# Patient Record
Sex: Male | Born: 1945 | Race: White | Hispanic: No | Smoking: Never smoker
Health system: Southern US, Community
[De-identification: ages and names within clinical notes are randomized; demographics above are authoritative.]

---

## 2019-06-18 ENCOUNTER — Emergency Department (HOSPITAL_COMMUNITY): Payer: Medicare Other

## 2019-06-18 ENCOUNTER — Inpatient Hospital Stay (HOSPITAL_COMMUNITY)
Admission: EM | Admit: 2019-06-18 | Discharge: 2019-07-07 | DRG: 064 | Disposition: A | Discharge status: Rehabilitation Facility | Payer: Medicare Other | Attending: Internal Medicine | Admitting: Internal Medicine

## 2019-06-18 ENCOUNTER — Other Ambulatory Visit: Payer: Self-pay

## 2019-06-18 ENCOUNTER — Encounter (HOSPITAL_COMMUNITY): Payer: Self-pay | Admitting: Emergency Medicine

## 2019-06-18 DIAGNOSIS — I63439 Cerebral infarction due to embolism of unspecified posterior cerebral artery: Secondary | ICD-10-CM

## 2019-06-18 DIAGNOSIS — Z6829 Body mass index (BMI) 29.0-29.9, adult: Secondary | ICD-10-CM | POA: Diagnosis not present

## 2019-06-18 DIAGNOSIS — R1310 Dysphagia, unspecified: Secondary | ICD-10-CM | POA: Diagnosis not present

## 2019-06-18 DIAGNOSIS — I63532 Cerebral infarction due to unspecified occlusion or stenosis of left posterior cerebral artery: Secondary | ICD-10-CM | POA: Diagnosis not present

## 2019-06-18 DIAGNOSIS — I6389 Other cerebral infarction: Secondary | ICD-10-CM | POA: Diagnosis not present

## 2019-06-18 DIAGNOSIS — I5021 Acute systolic (congestive) heart failure: Secondary | ICD-10-CM | POA: Diagnosis not present

## 2019-06-18 DIAGNOSIS — I130 Hypertensive heart and chronic kidney disease with heart failure and stage 1 through stage 4 chronic kidney disease, or unspecified chronic kidney disease: Secondary | ICD-10-CM | POA: Diagnosis present

## 2019-06-18 DIAGNOSIS — R0602 Shortness of breath: Secondary | ICD-10-CM

## 2019-06-18 DIAGNOSIS — I959 Hypotension, unspecified: Secondary | ICD-10-CM | POA: Diagnosis not present

## 2019-06-18 DIAGNOSIS — R1312 Dysphagia, oropharyngeal phase: Secondary | ICD-10-CM | POA: Diagnosis not present

## 2019-06-18 DIAGNOSIS — I519 Heart disease, unspecified: Secondary | ICD-10-CM

## 2019-06-18 DIAGNOSIS — J189 Pneumonia, unspecified organism: Secondary | ICD-10-CM

## 2019-06-18 DIAGNOSIS — S80212A Abrasion, left knee, initial encounter: Secondary | ICD-10-CM | POA: Diagnosis present

## 2019-06-18 DIAGNOSIS — D490 Neoplasm of unspecified behavior of digestive system: Secondary | ICD-10-CM | POA: Diagnosis present

## 2019-06-18 DIAGNOSIS — G8191 Hemiplegia, unspecified affecting right dominant side: Secondary | ICD-10-CM | POA: Diagnosis present

## 2019-06-18 DIAGNOSIS — I2102 ST elevation (STEMI) myocardial infarction involving left anterior descending coronary artery: Secondary | ICD-10-CM | POA: Diagnosis not present

## 2019-06-18 DIAGNOSIS — E669 Obesity, unspecified: Secondary | ICD-10-CM | POA: Diagnosis present

## 2019-06-18 DIAGNOSIS — Z8249 Family history of ischemic heart disease and other diseases of the circulatory system: Secondary | ICD-10-CM | POA: Diagnosis not present

## 2019-06-18 DIAGNOSIS — X58XXXA Exposure to other specified factors, initial encounter: Secondary | ICD-10-CM | POA: Diagnosis present

## 2019-06-18 DIAGNOSIS — N401 Enlarged prostate with lower urinary tract symptoms: Secondary | ICD-10-CM | POA: Diagnosis present

## 2019-06-18 DIAGNOSIS — I213 ST elevation (STEMI) myocardial infarction of unspecified site: Secondary | ICD-10-CM

## 2019-06-18 DIAGNOSIS — I69322 Dysarthria following cerebral infarction: Secondary | ICD-10-CM | POA: Diagnosis not present

## 2019-06-18 DIAGNOSIS — I639 Cerebral infarction, unspecified: Secondary | ICD-10-CM | POA: Diagnosis not present

## 2019-06-18 DIAGNOSIS — I255 Ischemic cardiomyopathy: Secondary | ICD-10-CM | POA: Diagnosis present

## 2019-06-18 DIAGNOSIS — I63432 Cerebral infarction due to embolism of left posterior cerebral artery: Secondary | ICD-10-CM

## 2019-06-18 DIAGNOSIS — J9601 Acute respiratory failure with hypoxia: Secondary | ICD-10-CM | POA: Diagnosis present

## 2019-06-18 DIAGNOSIS — Z931 Gastrostomy status: Secondary | ICD-10-CM | POA: Diagnosis not present

## 2019-06-18 DIAGNOSIS — R531 Weakness: Secondary | ICD-10-CM

## 2019-06-18 DIAGNOSIS — I69391 Dysphagia following cerebral infarction: Secondary | ICD-10-CM

## 2019-06-18 DIAGNOSIS — G9340 Encephalopathy, unspecified: Secondary | ICD-10-CM | POA: Diagnosis not present

## 2019-06-18 DIAGNOSIS — R9431 Abnormal electrocardiogram [ECG] [EKG]: Secondary | ICD-10-CM | POA: Diagnosis not present

## 2019-06-18 DIAGNOSIS — Z6831 Body mass index (BMI) 31.0-31.9, adult: Secondary | ICD-10-CM

## 2019-06-18 DIAGNOSIS — Z515 Encounter for palliative care: Secondary | ICD-10-CM | POA: Diagnosis present

## 2019-06-18 DIAGNOSIS — Z20822 Contact with and (suspected) exposure to covid-19: Secondary | ICD-10-CM | POA: Diagnosis present

## 2019-06-18 DIAGNOSIS — I340 Nonrheumatic mitral (valve) insufficiency: Secondary | ICD-10-CM | POA: Diagnosis not present

## 2019-06-18 DIAGNOSIS — E869 Volume depletion, unspecified: Secondary | ICD-10-CM | POA: Diagnosis not present

## 2019-06-18 DIAGNOSIS — I2119 ST elevation (STEMI) myocardial infarction involving other coronary artery of inferior wall: Secondary | ICD-10-CM | POA: Diagnosis present

## 2019-06-18 DIAGNOSIS — S80211A Abrasion, right knee, initial encounter: Secondary | ICD-10-CM | POA: Diagnosis present

## 2019-06-18 DIAGNOSIS — R4701 Aphasia: Secondary | ICD-10-CM | POA: Diagnosis present

## 2019-06-18 DIAGNOSIS — B370 Candidal stomatitis: Secondary | ICD-10-CM | POA: Diagnosis present

## 2019-06-18 DIAGNOSIS — I5043 Acute on chronic combined systolic (congestive) and diastolic (congestive) heart failure: Secondary | ICD-10-CM | POA: Diagnosis present

## 2019-06-18 DIAGNOSIS — I2111 ST elevation (STEMI) myocardial infarction involving right coronary artery: Secondary | ICD-10-CM | POA: Diagnosis not present

## 2019-06-18 DIAGNOSIS — J690 Pneumonitis due to inhalation of food and vomit: Secondary | ICD-10-CM | POA: Diagnosis present

## 2019-06-18 DIAGNOSIS — G9341 Metabolic encephalopathy: Secondary | ICD-10-CM | POA: Diagnosis present

## 2019-06-18 DIAGNOSIS — R402232 Coma scale, best verbal response, inappropriate words, at arrival to emergency department: Secondary | ICD-10-CM | POA: Diagnosis present

## 2019-06-18 DIAGNOSIS — I509 Heart failure, unspecified: Secondary | ICD-10-CM | POA: Diagnosis present

## 2019-06-18 DIAGNOSIS — Z79899 Other long term (current) drug therapy: Secondary | ICD-10-CM | POA: Diagnosis not present

## 2019-06-18 DIAGNOSIS — I2510 Atherosclerotic heart disease of native coronary artery without angina pectoris: Secondary | ICD-10-CM | POA: Diagnosis not present

## 2019-06-18 DIAGNOSIS — Z7189 Other specified counseling: Secondary | ICD-10-CM

## 2019-06-18 DIAGNOSIS — R29810 Facial weakness: Secondary | ICD-10-CM | POA: Diagnosis present

## 2019-06-18 DIAGNOSIS — R471 Dysarthria and anarthria: Secondary | ICD-10-CM | POA: Diagnosis present

## 2019-06-18 DIAGNOSIS — R197 Diarrhea, unspecified: Secondary | ICD-10-CM | POA: Diagnosis not present

## 2019-06-18 DIAGNOSIS — R402142 Coma scale, eyes open, spontaneous, at arrival to emergency department: Secondary | ICD-10-CM | POA: Diagnosis present

## 2019-06-18 DIAGNOSIS — I69351 Hemiplegia and hemiparesis following cerebral infarction affecting right dominant side: Secondary | ICD-10-CM | POA: Diagnosis present

## 2019-06-18 DIAGNOSIS — E785 Hyperlipidemia, unspecified: Secondary | ICD-10-CM | POA: Diagnosis present

## 2019-06-18 DIAGNOSIS — K118 Other diseases of salivary glands: Secondary | ICD-10-CM | POA: Diagnosis not present

## 2019-06-18 DIAGNOSIS — E7800 Pure hypercholesterolemia, unspecified: Secondary | ICD-10-CM | POA: Diagnosis not present

## 2019-06-18 DIAGNOSIS — E870 Hyperosmolality and hypernatremia: Secondary | ICD-10-CM | POA: Diagnosis present

## 2019-06-18 DIAGNOSIS — R338 Other retention of urine: Secondary | ICD-10-CM | POA: Diagnosis present

## 2019-06-18 DIAGNOSIS — G4700 Insomnia, unspecified: Secondary | ICD-10-CM | POA: Diagnosis not present

## 2019-06-18 DIAGNOSIS — I6521 Occlusion and stenosis of right carotid artery: Secondary | ICD-10-CM | POA: Diagnosis present

## 2019-06-18 DIAGNOSIS — E872 Acidosis: Secondary | ICD-10-CM | POA: Diagnosis present

## 2019-06-18 DIAGNOSIS — E46 Unspecified protein-calorie malnutrition: Secondary | ICD-10-CM | POA: Diagnosis present

## 2019-06-18 DIAGNOSIS — R0603 Acute respiratory distress: Secondary | ICD-10-CM | POA: Diagnosis not present

## 2019-06-18 DIAGNOSIS — I5023 Acute on chronic systolic (congestive) heart failure: Secondary | ICD-10-CM | POA: Diagnosis present

## 2019-06-18 DIAGNOSIS — R32 Unspecified urinary incontinence: Secondary | ICD-10-CM | POA: Diagnosis present

## 2019-06-18 DIAGNOSIS — Z781 Physical restraint status: Secondary | ICD-10-CM

## 2019-06-18 DIAGNOSIS — R4689 Other symptoms and signs involving appearance and behavior: Secondary | ICD-10-CM

## 2019-06-18 DIAGNOSIS — R29719 NIHSS score 19: Secondary | ICD-10-CM | POA: Diagnosis present

## 2019-06-18 DIAGNOSIS — R339 Retention of urine, unspecified: Secondary | ICD-10-CM | POA: Diagnosis not present

## 2019-06-18 DIAGNOSIS — R918 Other nonspecific abnormal finding of lung field: Secondary | ICD-10-CM | POA: Diagnosis present

## 2019-06-18 DIAGNOSIS — B372 Candidiasis of skin and nail: Secondary | ICD-10-CM | POA: Diagnosis not present

## 2019-06-18 DIAGNOSIS — R402342 Coma scale, best motor response, flexion withdrawal, at arrival to emergency department: Secondary | ICD-10-CM | POA: Diagnosis present

## 2019-06-18 DIAGNOSIS — I69390 Apraxia following cerebral infarction: Secondary | ICD-10-CM | POA: Diagnosis not present

## 2019-06-18 DIAGNOSIS — N183 Chronic kidney disease, stage 3 unspecified: Secondary | ICD-10-CM | POA: Diagnosis present

## 2019-06-18 DIAGNOSIS — N179 Acute kidney failure, unspecified: Secondary | ICD-10-CM | POA: Diagnosis present

## 2019-06-18 HISTORY — DX: Cerebral infarction due to embolism of unspecified posterior cerebral artery: I63.439

## 2019-06-18 LAB — CBG MONITORING, ED
Glucose-Capillary: 86 mg/dL (ref 70–99)
Glucose-Capillary: 91 mg/dL (ref 70–99)

## 2019-06-18 LAB — CBC
HCT: 42.7 % (ref 39.0–52.0)
Hemoglobin: 13.9 g/dL (ref 13.0–17.0)
MCH: 31.7 pg (ref 26.0–34.0)
MCHC: 32.6 g/dL (ref 30.0–36.0)
MCV: 97.3 fL (ref 80.0–100.0)
Platelets: 243 10*3/uL (ref 150–400)
RBC: 4.39 MIL/uL (ref 4.22–5.81)
RDW: 12.9 % (ref 11.5–15.5)
WBC: 17.3 10*3/uL — ABNORMAL HIGH (ref 4.0–10.5)
nRBC: 0 % (ref 0.0–0.2)

## 2019-06-18 LAB — POCT I-STAT EG7
Acid-base deficit: 2 mmol/L (ref 0.0–2.0)
Bicarbonate: 24.1 mmol/L (ref 20.0–28.0)
Calcium, Ion: 1.03 mmol/L — ABNORMAL LOW (ref 1.15–1.40)
HCT: 46 % (ref 39.0–52.0)
Hemoglobin: 15.6 g/dL (ref 13.0–17.0)
O2 Saturation: 69 %
Potassium: 4.1 mmol/L (ref 3.5–5.1)
Sodium: 137 mmol/L (ref 135–145)
TCO2: 25 mmol/L (ref 22–32)
pCO2, Ven: 43 mmHg — ABNORMAL LOW (ref 44.0–60.0)
pH, Ven: 7.357 (ref 7.250–7.430)
pO2, Ven: 37 mmHg (ref 32.0–45.0)

## 2019-06-18 LAB — PROTIME-INR
INR: 1.1 (ref 0.8–1.2)
Prothrombin Time: 13.9 seconds (ref 11.4–15.2)

## 2019-06-18 LAB — DIFFERENTIAL
Abs Immature Granulocytes: 0.13 10*3/uL — ABNORMAL HIGH (ref 0.00–0.07)
Basophils Absolute: 0 10*3/uL (ref 0.0–0.1)
Basophils Relative: 0 %
Eosinophils Absolute: 0.1 10*3/uL (ref 0.0–0.5)
Eosinophils Relative: 0 %
Immature Granulocytes: 1 %
Lymphocytes Relative: 8 %
Lymphs Abs: 1.4 10*3/uL (ref 0.7–4.0)
Monocytes Absolute: 2 10*3/uL — ABNORMAL HIGH (ref 0.1–1.0)
Monocytes Relative: 11 %
Neutro Abs: 13.8 10*3/uL — ABNORMAL HIGH (ref 1.7–7.7)
Neutrophils Relative %: 80 %

## 2019-06-18 LAB — I-STAT CHEM 8, ED
BUN: 41 mg/dL — ABNORMAL HIGH (ref 8–23)
Calcium, Ion: 1.01 mmol/L — ABNORMAL LOW (ref 1.15–1.40)
Chloride: 104 mmol/L (ref 98–111)
Creatinine, Ser: 1.5 mg/dL — ABNORMAL HIGH (ref 0.61–1.24)
Glucose, Bld: 95 mg/dL (ref 70–99)
HCT: 43 % (ref 39.0–52.0)
Hemoglobin: 14.6 g/dL (ref 13.0–17.0)
Potassium: 3.6 mmol/L (ref 3.5–5.1)
Sodium: 137 mmol/L (ref 135–145)
TCO2: 23 mmol/L (ref 22–32)

## 2019-06-18 LAB — COMPREHENSIVE METABOLIC PANEL
ALT: 60 U/L — ABNORMAL HIGH (ref 0–44)
AST: 73 U/L — ABNORMAL HIGH (ref 15–41)
Albumin: 2.9 g/dL — ABNORMAL LOW (ref 3.5–5.0)
Alkaline Phosphatase: 69 U/L (ref 38–126)
Anion gap: 13 (ref 5–15)
BUN: 43 mg/dL — ABNORMAL HIGH (ref 8–23)
CO2: 20 mmol/L — ABNORMAL LOW (ref 22–32)
Calcium: 8.5 mg/dL — ABNORMAL LOW (ref 8.9–10.3)
Chloride: 105 mmol/L (ref 98–111)
Creatinine, Ser: 1.66 mg/dL — ABNORMAL HIGH (ref 0.61–1.24)
GFR calc Af Amer: 47 mL/min — ABNORMAL LOW (ref >60)
GFR calc non Af Amer: 40 mL/min — ABNORMAL LOW (ref >60)
Glucose, Bld: 102 mg/dL — ABNORMAL HIGH (ref 70–99)
Potassium: 3.7 mmol/L (ref 3.5–5.1)
Sodium: 138 mmol/L (ref 135–145)
Total Bilirubin: 0.9 mg/dL (ref 0.3–1.2)
Total Protein: 6.7 g/dL (ref 6.5–8.1)

## 2019-06-18 LAB — TROPONIN I (HIGH SENSITIVITY)
Troponin I (High Sensitivity): 12799 ng/L (ref <18)
Troponin I (High Sensitivity): 12437 ng/L (ref <18)
Troponin I (High Sensitivity): 10883 ng/L (ref <18)

## 2019-06-18 LAB — APTT: aPTT: 26 seconds (ref 24–36)

## 2019-06-18 LAB — RESPIRATORY PANEL BY RT PCR (FLU A&B, COVID)
Influenza A by PCR: NEGATIVE
Influenza B by PCR: NEGATIVE
SARS Coronavirus 2 by RT PCR: NEGATIVE

## 2019-06-18 MED ORDER — ACETAMINOPHEN 650 MG RE SUPP: 650.0000 mg | RECTAL | Status: DC | PRN

## 2019-06-18 MED ORDER — SODIUM CHLORIDE 0.9% FLUSH
3.0000 mL | Freq: Once | INTRAVENOUS | Status: AC
  Administered 2019-06-18: 3 mL via INTRAVENOUS

## 2019-06-18 MED ORDER — SODIUM CHLORIDE 0.9 % IV SOLN: INTRAVENOUS | Status: DC

## 2019-06-18 MED ORDER — ATORVASTATIN CALCIUM 80 MG PO TABS
80.0000 mg | ORAL_TABLET | Freq: Every day | ORAL | Status: DC
  Administered 2019-06-21 – 2019-07-02 (×12): 80 mg via ORAL
  Filled 2019-06-18 (×11): qty 1

## 2019-06-18 MED ORDER — ASPIRIN 81 MG PO CHEW: 81.0000 mg | CHEWABLE_TABLET | Freq: Every day | ORAL | Status: DC

## 2019-06-18 MED ORDER — STROKE: EARLY STAGES OF RECOVERY BOOK: Freq: Once | Status: DC

## 2019-06-18 MED ORDER — IOHEXOL 350 MG/ML SOLN
100.0000 mL | Freq: Once | INTRAVENOUS | Status: AC | PRN
  Administered 2019-06-18: 100 mL via INTRAVENOUS

## 2019-06-18 MED ORDER — ACETAMINOPHEN 325 MG PO TABS: 650.0000 mg | ORAL_TABLET | ORAL | Status: DC | PRN

## 2019-06-18 MED ORDER — HEPARIN (PORCINE) 25000 UT/250ML-% IV SOLN
1600.0000 [IU]/h | INTRAVENOUS | Status: DC
  Administered 2019-06-18: 1200 [IU]/h via INTRAVENOUS
  Administered 2019-06-19: 1400 [IU]/h via INTRAVENOUS
  Administered 2019-06-20: 1600 [IU]/h via INTRAVENOUS
  Filled 2019-06-18 (×3): qty 250

## 2019-06-18 MED ORDER — ACETAMINOPHEN 160 MG/5ML PO SOLN: 650.0000 mg | ORAL | Status: DC | PRN

## 2019-06-18 NOTE — ED Triage Notes (Signed)
Pt was reported to have last spoke with his daughter on the phone & was normal around 1700 yesterday & when his neighbor noticed that he had not gotten his newspaper this morning he called for a wellfair check & EMS reports forced entry at his home & finding him laying supine in his bed with aphasia & Rt sided facial droop.

## 2019-06-18 NOTE — H&P (Signed)
History and Physical    Levi Pruitt [id number] DOB: [birth year] DOA: 06/18/2019  PCP: Patient, No Pcp Per  Patient coming from: home   Chief Complaint:  Found down at home  HPI: Levi Pruitt is a 74 y.o. male with medical history significant for no medical history (has not seen a physician in decades) who presents with above.  History obtained from significant other who is also his power of attorney. Patient is not communicative.  Last normal was last night around 9 pm when sig other spoke with patient on the phone. She notes that over the past several months the patient has been more sedentary than previously and having more problems with memory, but there have been no acute changes and no recent illness.  This morning someone from patient's work came by to drop off paperwork. Patient didn't answer door or answer phone. Co-worker came inside and found patient in bed, not making sense. EMS called and brought to our ED.  ED Course: code stroke, out of window for tpa. EKG showed likely stemi; initial troponin 12,799. Neuroimaging performed showing L pca and vertrebral infarct. Shared decision w/ cardiology to start heparin w/o bolus.  Review of Systems: As per HPI otherwise 10 point review of systems negative.    Past Medical History:  Diagnosis Date  . 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 No pertinent surgical history.   has no history on file for tobacco, alcohol, and drug.  No Known Allergies  History reviewed. No pertinent family history.  Prior to Admission medications   Not on File    Physical Exam: Vitals:   06/18/19 1815 06/18/19 1830 06/18/19 1845 06/18/19 1900  BP: (!) 141/80 (!) 129/94 128/86 134/83  Pulse: 81  77 80  Resp: (!) 23 (!) 21 (!) 24 (!) 25  SpO2: 95%  95% 96%    Constitutional: dissheveled Head: Atraumatic Eyes: Conjunctiva clear ENM: dry mucous membranes. Normal dentition.  Neck:  Supple Respiratory: rales at bases Cardiovascular: Regular rate and rhythm. Distant heart sounds, soft systolic murmur Abdomen: Non-tender, obese. No masses. No rebound or guarding. Positive bowel sounds. Musculoskeletal: No joint deformity upper and lower extremities. Normal ROM, no contractures. Decreased muscle tone.  Skin: bruises b/l knees Extremities: trace LE peripheral edema. Palpable peripheral pulses. Neurologic: awake, mumbles. Pupils not equal, gaze disconjugate. Right facial droop. Not using right arm or leg, poor at following commands Psychiatric: aao x0   Labs on Admission: I have personally reviewed following labs and imaging studies  CBC: Recent Labs  Lab 06/18/19 1439 06/18/19 1447 06/18/19 1619  WBC 17.3*  --   --   NEUTROABS 13.8*  --   --   HGB 13.9 14.6 15.6  HCT 42.7 43.0 46.0  MCV 97.3  --   --   PLT 243  --   --    Basic Metabolic Panel: Recent Labs  Lab 06/18/19 1439 06/18/19 1447 06/18/19 1619  NA 138 137 137  K 3.7 3.6 4.1  CL 105 104  --   CO2 20*  --   --   GLUCOSE 102* 95  --   BUN 43* 41*  --   CREATININE 1.66* 1.50*  --   CALCIUM 8.5*  --   --    GFR: CrCl cannot be calculated (Unknown ideal weight.). Liver Function Tests: Recent Labs  Lab 06/18/19 1439  AST 73*  ALT 60*  ALKPHOS 69  BILITOT 0.9  PROT 6.7  ALBUMIN 2.9*   No results for input(s): LIPASE, AMYLASE in the last 168 hours. No results for input(s): AMMONIA in the last 168 hours. Coagulation Profile: Recent Labs  Lab 06/18/19 1439  INR 1.1   Cardiac Enzymes: No results for input(s): CKTOTAL, CKMB, CKMBINDEX, TROPONINI in the last 168 hours. BNP (last 3 results) No results for input(s): PROBNP in the last 8760 hours. HbA1C: No results for input(s): HGBA1C in the last 72 hours. CBG: Recent Labs  Lab 06/18/19 1442 06/18/19 1451  GLUCAP 91 86   Lipid Profile: No results for input(s): CHOL, HDL, LDLCALC, TRIG, CHOLHDL, LDLDIRECT in the last 72  hours. Thyroid Function Tests: No results for input(s): TSH, T4TOTAL, FREET4, T3FREE, THYROIDAB in the last 72 hours. Anemia Panel: No results for input(s): VITAMINB12, FOLATE, FERRITIN, TIBC, IRON, RETICCTPCT in the last 72 hours. Urine analysis: No results found for: COLORURINE, APPEARANCEUR, LABSPEC, PHURINE, GLUCOSEU, HGBUR, BILIRUBINUR, KETONESUR, PROTEINUR, UROBILINOGEN, NITRITE, LEUKOCYTESUR  Radiological Exams on Admission: CT Code Stroke CTA Head W/WO contrast  Result Date: 06/18/2019 CLINICAL DATA:  74 year old male code stroke presentation with right side weakness. EXAM: CT ANGIOGRAPHY HEAD AND NECK CT PERFUSION BRAIN TECHNIQUE: Multidetector CT imaging of the head and neck was performed using the standard protocol during bolus administration of intravenous contrast. Multiplanar CT image reconstructions and MIPs were obtained to evaluate the vascular anatomy. Carotid stenosis measurements (when applicable) are obtained utilizing NASCET criteria, using the distal internal carotid diameter as the denominator. Multiphase CT imaging of the brain was performed following IV bolus contrast injection. Subsequent parametric perfusion maps were calculated using RAPID software. CONTRAST:  100 milliliters Omnipaque 350 COMPARISON:  Plain head CT 1448 hours today. FINDINGS: CT Brain Perfusion Findings: ASPECTS: 10, although a large left PCA territory infarct was suspected by plain CT. CBF (<30%) Volume: 45mL, corresponding to a portion of the left PCA territory in the occipital lobe Perfusion (Tmax>6.0s) volume: 55mL, although some of this is likely artifactual involving the right temporal lobe portion of which is chronically abnormal. Mismatch Volume: 66mL Infarction Location:Left PCA CTA NECK Skeleton: Carious dentition. No acute osseous abnormality identified. Upper chest: Small bilateral layering pleural effusions. Septal thickening and mild additional peribronchial opacity near the hila in the upper  lobes. No superior mediastinal lymphadenopathy. Other neck: Retropharyngeal course of both carotid arteries. Small round 8 millimeter intermediate density soft tissue nodule in the superficial lobe of the left parotid gland. No cervical lymphadenopathy or other neck mass. Aortic arch: Mild Calcified aortic atherosclerosis. 3 vessel arch configuration. Right carotid system: Mild motion artifact at the right CCA origin. Circumferential soft plaque in the right CCA proximal to the bifurcation as it has a retropharyngeal course on series 3, image 125, but less than 50 % stenosis with respect to the distal vessel. Superimposed calcified plaque at the right carotid bifurcation, and bulky additional soft plaque in the right ICA bulb resulting in a short segment high-grade stenosis approaching a radiographic string sign on series 3, image 94. The right ICA remains patent to the skull base. Left carotid system: Normal left CCA origin. Circumferential soft plaque in the left CCA similar to that on the right without stenosis. Soft and calcified plaque at the left ICA origin and bulb resulting in up to 60 % stenosis with respect to the distal vessel. Left ICA remains patent to the skull base. Vertebral arteries: Mild if any proximal right subclavian artery plaque with no stenosis there or at the right vertebral artery origin. The right vertebral appears  dominant and is patent to the skull base without stenosis. Soft and calcified plaque in the proximal left subclavian artery not resulting in significant subclavian artery stenosis, but there is moderate to severe stenosis at the left vertebral artery origin and V1 segment due to mostly soft plaque (series 6, image 154). The distal left V1 segment is also diminutive and stenotic (image 151). The left V2 segment is intermittently diminutive and stenotic, but the vessel remains patent to the skull base. CTA HEAD Posterior circulation: The right vertebral artery is patent to the  vertebrobasilar junction without stenosis. The right PICA origin remains patent. The left V4 segment is occluded distal to the patent left PICA but proximal to the vertebrobasilar junction on series 5, image 132. The basilar artery remains patent to the SCA origins. The left PCA is occluded just beyond its origin (series 9, image 26). No reconstitution. And the right PCA is functionally occluded at its origin with only minimal irregular right PCA branch enhancement. Anterior circulation: Both ICA siphons are patent. On the left there is moderate to severe circumferential calcified plaque in the cavernous segment resulting in moderate to severe stenosis proximal to the anterior genu (series 5, image 102). Moderate additional left supraclinoid segment stenosis. On the right there is similar advanced siphon atherosclerosis with moderate right cavernous and moderate to severe right supraclinoid segment stenosis. Patent right ICA terminus. There is a patent small left posterior communicating artery. Patent carotid termini. Patent MCA and ACA origins. Left A1 is mildly dominant. Anterior communicating artery is patent with a median artery of the corpus callosum. Bilateral ACA branches are patent with mild irregularity. Left MCA M1 segment and left MCA bifurcation are patent without stenosis. Left MCA M3 branches are mildly to moderately irregular. Right MCA M1 segment and bifurcation are patent without stenosis. Right MCA M3 branches are also mildly to moderately irregular. Venous sinuses: Early contrast timing, grossly patent assuming dominant right side transverse and sigmoid sinuses. Anatomic variants: Median artery of the corpus callosum. Review of the MIP images confirms the above findings IMPRESSION: 1. Positive for acute appearing occlusions of the distal Left Vertebral Artery and Left PCA: - distal left V4 occlusion, with upstream multifocal moderate and severe left vertebral stenosis in the neck. - left PCA P1  occlusion just beyond the origin. 2. CTP detects only some of the left PCA territory core infarct which was visible by plain CT. 3. 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 The right vertebral artery and basilar arteries are patent without stenosis. 4. 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 5. Mild to moderate irregularity of the bilateral MCA branches. 6. Small bilateral pleural effusions with increased pulmonary septal thickening suggestive of pulmonary edema. 7. 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 Recommend follow-up with ENT. Preliminary results of the arterial findings were text paged to Dr. Lorraine Lax via Shea Evans at 1503 hours, and again at 1518 hours. Electronically Signed   By: Genevie Ann M.D.   On: 06/18/2019 15:26   CT Code Stroke CTA Neck W/WO contrast  Result Date: 06/18/2019 CLINICAL DATA:  74 year old male code stroke presentation with right side weakness. EXAM: CT ANGIOGRAPHY HEAD AND NECK CT PERFUSION BRAIN TECHNIQUE: Multidetector CT imaging of the head and neck was performed using the standard protocol during bolus administration of intravenous contrast. Multiplanar CT image  reconstructions and MIPs were obtained to evaluate the vascular anatomy. Carotid stenosis measurements (when applicable) are obtained utilizing NASCET criteria, using the distal internal carotid diameter as the denominator. Multiphase CT imaging of the brain was performed following IV bolus contrast injection. Subsequent parametric perfusion maps were calculated using RAPID software. CONTRAST:  100 milliliters Omnipaque 350 COMPARISON:  Plain head CT 1448 hours today. FINDINGS: CT Brain Perfusion Findings: ASPECTS: 10, although a large left PCA territory infarct was suspected by plain CT. CBF (<30%) Volume: 68mL, corresponding to a portion  of the left PCA territory in the occipital lobe Perfusion (Tmax>6.0s) volume: 73mL, although some of this is likely artifactual involving the right temporal lobe portion of which is chronically abnormal. Mismatch Volume: 8mL Infarction Location:Left PCA CTA NECK Skeleton: Carious dentition. No acute osseous abnormality identified. Upper chest: Small bilateral layering pleural effusions. Septal thickening and mild additional peribronchial opacity near the hila in the upper lobes. No superior mediastinal lymphadenopathy. Other neck: Retropharyngeal course of both carotid arteries. Small round 8 millimeter intermediate density soft tissue nodule in the superficial lobe of the left parotid gland. No cervical lymphadenopathy or other neck mass. Aortic arch: Mild Calcified aortic atherosclerosis. 3 vessel arch configuration. Right carotid system: Mild motion artifact at the right CCA origin. Circumferential soft plaque in the right CCA proximal to the bifurcation as it has a retropharyngeal course on series 3, image 125, but less than 50 % stenosis with respect to the distal vessel. Superimposed calcified plaque at the right carotid bifurcation, and bulky additional soft plaque in the right ICA bulb resulting in a short segment high-grade stenosis approaching a radiographic string sign on series 3, image 94. The right ICA remains patent to the skull base. Left carotid system: Normal left CCA origin. Circumferential soft plaque in the left CCA similar to that on the right without stenosis. Soft and calcified plaque at the left ICA origin and bulb resulting in up to 60 % stenosis with respect to the distal vessel. Left ICA remains patent to the skull base. Vertebral arteries: Mild if any proximal right subclavian artery plaque with no stenosis there or at the right vertebral artery origin. The right vertebral appears dominant and is patent to the skull base without stenosis. Soft and calcified plaque in the proximal left  subclavian artery not resulting in significant subclavian artery stenosis, but there is moderate to severe stenosis at the left vertebral artery origin and V1 segment due to mostly soft plaque (series 6, image 154). The distal left V1 segment is also diminutive and stenotic (image 151). The left V2 segment is intermittently diminutive and stenotic, but the vessel remains patent to the skull base. CTA HEAD Posterior circulation: The right vertebral artery is patent to the vertebrobasilar junction without stenosis. The right PICA origin remains patent. The left V4 segment is occluded distal to the patent left PICA but proximal to the vertebrobasilar junction on series 5, image 132. The basilar artery remains patent to the SCA origins. The left PCA is occluded just beyond its origin (series 9, image 26). No reconstitution. And the right PCA is functionally occluded at its origin with only minimal irregular right PCA branch enhancement. Anterior circulation: Both ICA siphons are patent. On the left there is moderate to severe circumferential calcified plaque in the cavernous segment resulting in moderate to severe stenosis proximal to the anterior genu (series 5, image 102). Moderate additional left supraclinoid segment stenosis. On the right there is similar advanced siphon atherosclerosis with moderate  right cavernous and moderate to severe right supraclinoid segment stenosis. Patent right ICA terminus. There is a patent small left posterior communicating artery. Patent carotid termini. Patent MCA and ACA origins. Left A1 is mildly dominant. Anterior communicating artery is patent with a median artery of the corpus callosum. Bilateral ACA branches are patent with mild irregularity. Left MCA M1 segment and left MCA bifurcation are patent without stenosis. Left MCA M3 branches are mildly to moderately irregular. Right MCA M1 segment and bifurcation are patent without stenosis. Right MCA M3 branches are also mildly to  moderately irregular. Venous sinuses: Early contrast timing, grossly patent assuming dominant right side transverse and sigmoid sinuses. Anatomic variants: Median artery of the corpus callosum. Review of the MIP images confirms the above findings IMPRESSION: 1. Positive for acute appearing occlusions of the distal Left Vertebral Artery and Left PCA: - distal left V4 occlusion, with upstream multifocal moderate and severe left vertebral stenosis in the neck. - left PCA P1 occlusion just beyond the origin. 2. CTP detects only some of the left PCA territory core infarct which was visible by plain CT. 3. 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 The right vertebral artery and basilar arteries are patent without stenosis. 4. 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 5. Mild to moderate irregularity of the bilateral MCA branches. 6. Small bilateral pleural effusions with increased pulmonary septal thickening suggestive of pulmonary edema. 7. 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 Recommend follow-up with ENT. Preliminary results of the arterial findings were text paged to Dr. Lorraine Lax via Shea Evans at 1503 hours, and again at 1518 hours. Electronically Signed   By: Genevie Ann M.D.   On: 06/18/2019 15:26   CT Code Stroke Cerebral Perfusion with contrast  Result Date: 06/18/2019 CLINICAL DATA:  74 year old male code stroke presentation with right side weakness. EXAM: CT ANGIOGRAPHY HEAD AND NECK CT PERFUSION BRAIN TECHNIQUE: Multidetector CT imaging of the head and neck was performed using the standard protocol during bolus administration of intravenous contrast. Multiplanar CT image reconstructions and MIPs were obtained to evaluate the vascular anatomy. Carotid stenosis measurements (when applicable) are obtained utilizing NASCET criteria,  using the distal internal carotid diameter as the denominator. Multiphase CT imaging of the brain was performed following IV bolus contrast injection. Subsequent parametric perfusion maps were calculated using RAPID software. CONTRAST:  100 milliliters Omnipaque 350 COMPARISON:  Plain head CT 1448 hours today. FINDINGS: CT Brain Perfusion Findings: ASPECTS: 10, although a large left PCA territory infarct was suspected by plain CT. CBF (<30%) Volume: 69mL, corresponding to a portion of the left PCA territory in the occipital lobe Perfusion (Tmax>6.0s) volume: 81mL, although some of this is likely artifactual involving the right temporal lobe portion of which is chronically abnormal. Mismatch Volume: 11mL Infarction Location:Left PCA CTA NECK Skeleton: Carious dentition. No acute osseous abnormality identified. Upper chest: Small bilateral layering pleural effusions. Septal thickening and mild additional peribronchial opacity near the hila in the upper lobes. No superior mediastinal lymphadenopathy. Other neck: Retropharyngeal course of both carotid arteries. Small round 8 millimeter intermediate density soft tissue nodule in the superficial lobe of the left parotid gland. No cervical lymphadenopathy or other neck mass. Aortic arch: Mild Calcified aortic atherosclerosis. 3 vessel arch configuration. Right carotid system: Mild motion artifact at the right CCA origin. Circumferential soft plaque in the  right CCA proximal to the bifurcation as it has a retropharyngeal course on series 3, image 125, but less than 50 % stenosis with respect to the distal vessel. Superimposed calcified plaque at the right carotid bifurcation, and bulky additional soft plaque in the right ICA bulb resulting in a short segment high-grade stenosis approaching a radiographic string sign on series 3, image 94. The right ICA remains patent to the skull base. Left carotid system: Normal left CCA origin. Circumferential soft plaque in the left CCA  similar to that on the right without stenosis. Soft and calcified plaque at the left ICA origin and bulb resulting in up to 60 % stenosis with respect to the distal vessel. Left ICA remains patent to the skull base. Vertebral arteries: Mild if any proximal right subclavian artery plaque with no stenosis there or at the right vertebral artery origin. The right vertebral appears dominant and is patent to the skull base without stenosis. Soft and calcified plaque in the proximal left subclavian artery not resulting in significant subclavian artery stenosis, but there is moderate to severe stenosis at the left vertebral artery origin and V1 segment due to mostly soft plaque (series 6, image 154). The distal left V1 segment is also diminutive and stenotic (image 151). The left V2 segment is intermittently diminutive and stenotic, but the vessel remains patent to the skull base. CTA HEAD Posterior circulation: The right vertebral artery is patent to the vertebrobasilar junction without stenosis. The right PICA origin remains patent. The left V4 segment is occluded distal to the patent left PICA but proximal to the vertebrobasilar junction on series 5, image 132. The basilar artery remains patent to the SCA origins. The left PCA is occluded just beyond its origin (series 9, image 26). No reconstitution. And the right PCA is functionally occluded at its origin with only minimal irregular right PCA branch enhancement. Anterior circulation: Both ICA siphons are patent. On the left there is moderate to severe circumferential calcified plaque in the cavernous segment resulting in moderate to severe stenosis proximal to the anterior genu (series 5, image 102). Moderate additional left supraclinoid segment stenosis. On the right there is similar advanced siphon atherosclerosis with moderate right cavernous and moderate to severe right supraclinoid segment stenosis. Patent right ICA terminus. There is a patent small left posterior  communicating artery. Patent carotid termini. Patent MCA and ACA origins. Left A1 is mildly dominant. Anterior communicating artery is patent with a median artery of the corpus callosum. Bilateral ACA branches are patent with mild irregularity. Left MCA M1 segment and left MCA bifurcation are patent without stenosis. Left MCA M3 branches are mildly to moderately irregular. Right MCA M1 segment and bifurcation are patent without stenosis. Right MCA M3 branches are also mildly to moderately irregular. Venous sinuses: Early contrast timing, grossly patent assuming dominant right side transverse and sigmoid sinuses. Anatomic variants: Median artery of the corpus callosum. Review of the MIP images confirms the above findings IMPRESSION: 1. Positive for acute appearing occlusions of the distal Left Vertebral Artery and Left PCA: - distal left V4 occlusion, with upstream multifocal moderate and severe left vertebral stenosis in the neck. - left PCA P1 occlusion just beyond the origin. 2. CTP detects only some of the left PCA territory core infarct which was visible by plain CT. 3. 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 The right vertebral artery and basilar arteries are patent without stenosis. 4. Positive also for: - High-grade stenosis of the Right ICA bulb approaching a  radiographic string sign, - High-grade stenosis of both ICA siphons due to advanced calcified plaque, -60% proximal left ICA stenosis. 5. Mild to moderate irregularity of the bilateral MCA branches. 6. Small bilateral pleural effusions with increased pulmonary septal thickening suggestive of pulmonary edema. 7. 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 Recommend follow-up with ENT. Preliminary results of the arterial findings were text paged to Dr. Lorraine Lax via Shea Evans at 1503 hours, and again at 1518 hours. Electronically Signed   By: Genevie Ann M.D.   On: 06/18/2019 15:26   XR Chest Single View  Result Date: 06/18/2019 CLINICAL DATA:   Altered behavior EXAM: PORTABLE CHEST 1 VIEW COMPARISON:  None. FINDINGS: Mild cardiomegaly with central vascular congestion. Probable small pleural effusions. Patchy airspace disease left infrahilar lung. No pneumothorax. IMPRESSION: 1. Mild cardiomegaly with central vascular congestion and small pleural effusions. 2. Patchy airspace disease in the left infrahilar lung, atelectasis versus pneumonia. Electronically Signed   By: Donavan Foil M.D.   On: 06/18/2019 17:40   CT HEAD CODE STROKE WO CONTRAST  Result Date: 06/18/2019 CLINICAL DATA:  Code stroke. 74 year old male with right side weakness and facial droop. EXAM: CT HEAD WITHOUT CONTRAST TECHNIQUE: Contiguous axial images were obtained from the base of the skull through the vertex without intravenous contrast. COMPARISON:  None. FINDINGS: Brain: Large area of hypodense cytotoxic edema demonstrated from the mesial left temporal lobe through the left occipital pole in keeping with a large left PCA territory infarct. Mild regional mass effect. No associated hemorrhage. Comparatively mild hypodensity in the left thalamus. Superimposed chronic appearing encephalomalacia in the right temporal and occipital lobes. No MCA territory acute cortically based infarct changes identified. No acute intracranial hemorrhage identified. No ventriculomegaly. No midline shift and patent basilar cisterns. Vascular: Calcified atherosclerosis at the skull base. No suspicious intracranial vascular hyperdensity. Skull: No acute osseous abnormality identified. Sinuses/Orbits: Well pneumatized paranasal sinuses. Tympanic cavities and mastoids are clear. Other: Visualized orbits and scalp soft tissues are within normal limits. ASPECTS [facility] Stroke Program Early CT Score) Total score (0-10 with 10 being normal): 10, but not applicable in this case given evidence of large acute left PCA infarct. IMPRESSION: 1. Large acute Left PCA territory infarct. No associated hemorrhage. Minor  mass effect. 2. No acute anterior circulation infarct is evident (ASPECTS 10). 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 3. These results were communicated to Dr. Lorraine Lax at 2:59 pm on 06/18/2019 by text page via the [facility] messaging system. Electronically Signed   By: Genevie Ann M.D.   On: 06/18/2019 15:02    EKG: Independently reviewed. 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 Neuro has seen. Out of window for tpa. Heparin as below, keeping in mind risk of hemorrhagic conversion. Glucose wnl. Significant other updated of serious condition. covid neg - appreciate neuro recs - stroke order set - permissive htn - npo, swallow eval ordered - holding on pt/ot consult given degree of current debility - progressive status - TTE and MRI ordered - telemetry - neuro checks - asa and statin ordered  # STEMI - initial trop very elevated. Seen by cardiology, heparin w/o  loading dose ordered, not currently candidate for intervention. Hemodynamically stable. - heparin - trend troponins - cont asa and statin; eventual plavix - telemetry  # AKI - suspect aki, no prior cr for comparison. 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

## 2019-06-18 NOTE — ED Provider Notes (Signed)
  Provider Note MRN:  [id number]  Arrival date & time: 06/18/19    ED Course and Medical Decision Making  Assumed care from Dr. Melina Copa and PA Quentin Cornwall at shift change.  Patient presenting as code stroke and CT revealing large acute PCA territory infarct.  He is altered with garbled speech.  I was provided his EKG which is concerning for ST elevation myocardial infarction given the elevations inferiorly with the reciprocal depressions laterally.  I considered that these findings were related to the acute stroke.  We also considered that patient is a poor candidate for anticoagulation given the acute stroke, as recommended by the neurology team.  For these reasons, code STEMI was not called.  Rather, I immediately paged and talked to Dr. Claiborne Billings of cardiology.  He was able to review the EKG and we discussed the patient's complicated case.  We concluded that patient is best managed medically at this point, but not a candidate for PCI at this time.  We also discussed the morphology of the EKG and we agree that the findings seem more localized to the RCA rather than having diffuse changes which would be more concerning for dissection.  We will hold off on CTA imaging of the aorta for now.  3:37 PM update: Discussed the case again with Dr. Lorraine Lax of neurology.  Patient may be able to have heparin drip and may be appropriate for PCI from a neurology perspective.  Dr. Lorraine Lax has agreed to call Dr. Claiborne Billings and they will discuss the patient's acute needs.  6:45 PM update: Patient has been accepted for admission by the hospitalist service, who will act as primary team with the consulting neurology and cardiology services.  Dr. Burt Knack of cardiology has ordered heparin.  There is risk of hemorrhagic conversion of the stroke but the consensus is that there is greater risk of not treating the acute MI.  Will avoid bolus heparin and provide drip.  .Critical Care Performed by: Maudie Flakes, MD Authorized by: Maudie Flakes, MD   Critical care provider statement:    Critical care time (minutes):  33   Critical care was necessary to treat or prevent imminent or life-threatening deterioration of the following conditions:  CNS failure or compromise (Acute ischemic stroke)   Critical care was time spent personally by me on the following activities:  Discussions with consultants, evaluation of patient's response to treatment, examination of patient, ordering and performing treatments and interventions, ordering and review of laboratory studies, ordering and review of radiographic studies, pulse oximetry, re-evaluation of patient's condition, obtaining history from patient or surrogate and review of old charts   I assumed direction of critical care for this patient from another provider in my specialty: yes      Final Clinical Impressions(s) / ED Diagnoses     ICD-10-CM   1. Acute ischemic stroke (HCC)  I63.9   2. Abnormal EKG  R94.31   3. Altered behavior  R46.89 XR Chest Single View    XR Chest Single View  4. ST elevation myocardial infarction (STEMI), unspecified artery [facility])  I21.3     ED Discharge Orders    None      Discharge Instructions   None     Barth Kirks. Sedonia Small, Remy [email or web address]    Maudie Flakes, MD 06/18/19 [phone]

## 2019-06-18 NOTE — Consult Note (Addendum)
Cardiology Consultation:   Patient ID: Levi Pruitt; [id number]; 1946-05-29   Admit date: 06/18/2019 Date of Consult: 06/18/2019  Primary Care Provider: Patient, No Pcp Per Primary Cardiologist: No primary care provider on file. New Primary Electrophysiologist:  None   Patient Profile:   Levi Pruitt is a 75 y.o. male with unknown med hx, who is being seen today for the evaluation of abnl ECG  at the request of Dr Sedonia Small.  History of Present Illness:   [name] has no previous records in Epic or Care Everywhere.   His girlfriend and healthcare POA is Levi Pruitt, I spoke w/ her on the phone. She does not live with him, but talks to him daily.   Per her, he has not been to the doctor in probably 20 years. No recent medical care or UC visits.   Pt having memory problems, not remembering to eat, not remembering talking to family, etc.   No complaints of pain, no specific issues. Started complaining of feeling weak, general malaise a weak ago. No fevers or cough. Forgot to eat for a couple of days. Problems getting up off the couch, weakness getting worse.   Last pm, pt had eaten, he was feeling a little better. No reports of chest pain.  Today, neighbors became concerned>>welfare check>>pt found in bed, R facial droop, R arm flaccid, garbled speech.  Pt currently does not appear uncomfortable. He is not able to answer questions but does vocalize some sounds. Not moving R side. L movements are not completely random, but not responsive to commands.   Past Medical History:  Diagnosis Date  . 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 No pertinent surgical history.   Prior to Admission medications   None    Inpatient Medications: Scheduled Meds:  Continuous Infusions:  PRN Meds:   Allergies:   No Known Allergies  Social History:   Social History   Socioeconomic History  . Marital status: Widowed    Spouse name: Not on  file  . Number of children: Not on file  . Years of education: Not on file  . Highest education level: Not on file  Occupational History  . Occupation: Lawer, semi-retired.  Tobacco Use  . Smoking status: Not on file  Substance and Sexual Activity  . Alcohol use: Not on file  . Drug use: Not on file  . Sexual activity: Not on file  Other Topics Concern  . Not on file  Social History Narrative   Staying home most of the time, does not exercise.    Social Determinants of Health   Financial Resource Strain:   . Difficulty of Paying Living Expenses: Not on file  Food Insecurity:   . Worried About Charity fundraiser in the Last Year: Not on file  . Ran Out of Food in the Last Year: Not on file  Transportation Needs:   . Lack of Transportation (Medical): Not on file  . Lack of Transportation (Non-Medical): Not on file  Physical Activity:   . Days of Exercise per Week: Not on file  . Minutes of Exercise per Session: Not on file  Stress:   . Feeling of Stress : Not on file  Social Connections:   . Frequency of Communication with Friends and Family: Not on file  . Frequency of Social Gatherings with Friends and Family: Not on file  . Attends Religious Services: Not on file  . Active Member of Clubs  or Organizations: Not on file  . Attends Archivist Meetings: Not on file  . Marital Status: Not on file  Intimate Partner Violence:   . Fear of Current or Ex-Partner: Not on file  . Emotionally Abused: Not on file  . Physically Abused: Not on file  . Sexually Abused: Not on file    Family History:   History reviewed. No pertinent family history known Family Status:  Family Status  Relation Name Status  . Mother  Deceased  . Father  Deceased    ROS:  Please see the history of present illness.  All other ROS reviewed and negative.     Physical Exam/Data:   Vitals:   06/18/19 1515 06/18/19 1530 06/18/19 1545 06/18/19 1600  BP: (!) 120/106  109/71 114/75    Pulse: 83 79 70 69  Resp: (!) 25 (!) 25 (!) 24 (!) 24  SpO2: 96% 95% 93% 96%   No intake or output data in the 24 hours ending 06/18/19 1704 There were no vitals filed for this visit. There is no height or weight on file to calculate BMI.  General:  Well nourished, well developed, male in no acute distress HEENT: normal for age w/ R facial droop.  Lymph: no adenopathy Neck: JVD - not elevated Endocrine:  No thryomegaly Vascular: No carotid bruits; upper extremity pulses 2+, DP/PT pulses decreased, popliteal pulses 2+ Cardiac:  normal S1, S2; RRR; no murmur Lungs:  clear bilaterally, no wheezing, rhonchi or rales, + upper airway wheeze. Abd: soft, nontender, no hepatomegaly  Ext: no edema Musculoskeletal:  No deformities, LUE and LLE strength weak, no voluntary movements on Right Skin: warm and dry, abrasions noted to both knees Neuro:  R facial droop noted  EKG:  The EKG was personally reviewed and demonstrates:  01/08 ECG is SR, HR 85, Inf ST elevation, Q waves V1-3, no old available to compare  Telemetry:  Telemetry was personally reviewed and demonstrates:  SR   CV studies:   None  Laboratory Data:   Chemistry Recent Labs  Lab 06/18/19 1439 06/18/19 1447 06/18/19 1619  NA 138 137 137  K 3.7 3.6 4.1  CL 105 104  --   CO2 20*  --   --   GLUCOSE 102* 95  --   BUN 43* 41*  --   CREATININE 1.66* 1.50*  --   CALCIUM 8.5*  --   --   GFRNONAA 40*  --   --   GFRAA 47*  --   --   ANIONGAP 13  --   --     Lab Results  Component Value Date   ALT 60 (H) 06/18/2019   AST 73 (H) 06/18/2019   ALKPHOS 69 06/18/2019   BILITOT 0.9 06/18/2019   Hematology Recent Labs  Lab 06/18/19 1439 06/18/19 1447 06/18/19 1619  WBC 17.3*  --   --   RBC 4.39  --   --   HGB 13.9 14.6 15.6  HCT 42.7 43.0 46.0  MCV 97.3  --   --   MCH 31.7  --   --   MCHC 32.6  --   --   RDW 12.9  --   --   PLT 243  --   --    Cardiac Enzymes High Sensitivity Troponin:  No results for  input(s): TROPONINIHS in the last 720 hours.    BNPNo results for input(s): BNP, PROBNP in the last 168 hours.  DDimer No results for input(s):  DDIMER in the last 168 hours. TSH: No results found for: TSH Lipids:No results found for: CHOL, HDL, LDLCALC, LDLDIRECT, TRIG, CHOLHDL HgbA1c:No results found for: HGBA1C Magnesium: No results found for: MG   Radiology/Studies:  CT Code Stroke CTA Head W/WO contrast  Result Date: 06/18/2019 CLINICAL DATA:  74 year old male code stroke presentation with right side weakness. EXAM: CT ANGIOGRAPHY HEAD AND NECK CT PERFUSION BRAIN TECHNIQUE: Multidetector CT imaging of the head and neck was performed using the standard protocol during bolus administration of intravenous contrast. Multiplanar CT image reconstructions and MIPs were obtained to evaluate the vascular anatomy. Carotid stenosis measurements (when applicable) are obtained utilizing NASCET criteria, using the distal internal carotid diameter as the denominator. Multiphase CT imaging of the brain was performed following IV bolus contrast injection. Subsequent parametric perfusion maps were calculated using RAPID software. CONTRAST:  100 milliliters Omnipaque 350 COMPARISON:  Plain head CT 1448 hours today. FINDINGS: CT Brain Perfusion Findings: ASPECTS: 10, although a large left PCA territory infarct was suspected by plain CT. CBF (<30%) Volume: 28mL, corresponding to a portion of the left PCA territory in the occipital lobe Perfusion (Tmax>6.0s) volume: 37mL, although some of this is likely artifactual involving the right temporal lobe portion of which is chronically abnormal. Mismatch Volume: 53mL Infarction Location:Left PCA CTA NECK Skeleton: Carious dentition. No acute osseous abnormality identified. Upper chest: Small bilateral layering pleural effusions. Septal thickening and mild additional peribronchial opacity near the hila in the upper lobes. No superior mediastinal lymphadenopathy. Other neck:  Retropharyngeal course of both carotid arteries. Small round 8 millimeter intermediate density soft tissue nodule in the superficial lobe of the left parotid gland. No cervical lymphadenopathy or other neck mass. Aortic arch: Mild Calcified aortic atherosclerosis. 3 vessel arch configuration. Right carotid system: Mild motion artifact at the right CCA origin. Circumferential soft plaque in the right CCA proximal to the bifurcation as it has a retropharyngeal course on series 3, image 125, but less than 50 % stenosis with respect to the distal vessel. Superimposed calcified plaque at the right carotid bifurcation, and bulky additional soft plaque in the right ICA bulb resulting in a short segment high-grade stenosis approaching a radiographic string sign on series 3, image 94. The right ICA remains patent to the skull base. Left carotid system: Normal left CCA origin. Circumferential soft plaque in the left CCA similar to that on the right without stenosis. Soft and calcified plaque at the left ICA origin and bulb resulting in up to 60 % stenosis with respect to the distal vessel. Left ICA remains patent to the skull base. Vertebral arteries: Mild if any proximal right subclavian artery plaque with no stenosis there or at the right vertebral artery origin. The right vertebral appears dominant and is patent to the skull base without stenosis. Soft and calcified plaque in the proximal left subclavian artery not resulting in significant subclavian artery stenosis, but there is moderate to severe stenosis at the left vertebral artery origin and V1 segment due to mostly soft plaque (series 6, image 154). The distal left V1 segment is also diminutive and stenotic (image 151). The left V2 segment is intermittently diminutive and stenotic, but the vessel remains patent to the skull base. CTA HEAD Posterior circulation: The right vertebral artery is patent to the vertebrobasilar junction without stenosis. The right PICA  origin remains patent. The left V4 segment is occluded distal to the patent left PICA but proximal to the vertebrobasilar junction on series 5, image 132. The basilar artery  remains patent to the SCA origins. The left PCA is occluded just beyond its origin (series 9, image 26). No reconstitution. And the right PCA is functionally occluded at its origin with only minimal irregular right PCA branch enhancement. Anterior circulation: Both ICA siphons are patent. On the left there is moderate to severe circumferential calcified plaque in the cavernous segment resulting in moderate to severe stenosis proximal to the anterior genu (series 5, image 102). Moderate additional left supraclinoid segment stenosis. On the right there is similar advanced siphon atherosclerosis with moderate right cavernous and moderate to severe right supraclinoid segment stenosis. Patent right ICA terminus. There is a patent small left posterior communicating artery. Patent carotid termini. Patent MCA and ACA origins. Left A1 is mildly dominant. Anterior communicating artery is patent with a median artery of the corpus callosum. Bilateral ACA branches are patent with mild irregularity. Left MCA M1 segment and left MCA bifurcation are patent without stenosis. Left MCA M3 branches are mildly to moderately irregular. Right MCA M1 segment and bifurcation are patent without stenosis. Right MCA M3 branches are also mildly to moderately irregular. Venous sinuses: Early contrast timing, grossly patent assuming dominant right side transverse and sigmoid sinuses. Anatomic variants: Median artery of the corpus callosum. Review of the MIP images confirms the above findings IMPRESSION: 1. Positive for acute appearing occlusions of the distal Left Vertebral Artery and Left PCA: - distal left V4 occlusion, with upstream multifocal moderate and severe left vertebral stenosis in the neck. - left PCA P1 occlusion just beyond the origin. 2. CTP detects only some  of the left PCA territory core infarct which was visible by plain CT. 3. 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 The right vertebral artery and basilar arteries are patent without stenosis. 4. 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 5. Mild to moderate irregularity of the bilateral MCA branches. 6. Small bilateral pleural effusions with increased pulmonary septal thickening suggestive of pulmonary edema. 7. 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 Recommend follow-up with ENT. Preliminary results of the arterial findings were text paged to Dr. Lorraine Lax via Shea Evans at 1503 hours, and again at 1518 hours. Electronically Signed   By: Genevie Ann M.D.   On: 06/18/2019 15:26   CT Code Stroke CTA Neck W/WO contrast  Result Date: 06/18/2019 CLINICAL DATA:  74 year old male code stroke presentation with right side weakness. EXAM: CT ANGIOGRAPHY HEAD AND NECK CT PERFUSION BRAIN TECHNIQUE: Multidetector CT imaging of the head and neck was performed using the standard protocol during bolus administration of intravenous contrast. Multiplanar CT image reconstructions and MIPs were obtained to evaluate the vascular anatomy. Carotid stenosis measurements (when applicable) are obtained utilizing NASCET criteria, using the distal internal carotid diameter as the denominator. Multiphase CT imaging of the brain was performed following IV bolus contrast injection. Subsequent parametric perfusion maps were calculated using RAPID software. CONTRAST:  100 milliliters Omnipaque 350 COMPARISON:  Plain head CT 1448 hours today. FINDINGS: CT Brain Perfusion Findings: ASPECTS: 10, although a large left PCA territory infarct was suspected by plain CT. CBF (<30%) Volume: 48mL, corresponding to a portion of the left PCA territory in the occipital lobe Perfusion  (Tmax>6.0s) volume: 92mL, although some of this is likely artifactual involving the right temporal lobe portion of which is chronically abnormal. Mismatch Volume: 33mL Infarction Location:Left PCA CTA  NECK Skeleton: Carious dentition. No acute osseous abnormality identified. Upper chest: Small bilateral layering pleural effusions. Septal thickening and mild additional peribronchial opacity near the hila in the upper lobes. No superior mediastinal lymphadenopathy. Other neck: Retropharyngeal course of both carotid arteries. Small round 8 millimeter intermediate density soft tissue nodule in the superficial lobe of the left parotid gland. No cervical lymphadenopathy or other neck mass. Aortic arch: Mild Calcified aortic atherosclerosis. 3 vessel arch configuration. Right carotid system: Mild motion artifact at the right CCA origin. Circumferential soft plaque in the right CCA proximal to the bifurcation as it has a retropharyngeal course on series 3, image 125, but less than 50 % stenosis with respect to the distal vessel. Superimposed calcified plaque at the right carotid bifurcation, and bulky additional soft plaque in the right ICA bulb resulting in a short segment high-grade stenosis approaching a radiographic string sign on series 3, image 94. The right ICA remains patent to the skull base. Left carotid system: Normal left CCA origin. Circumferential soft plaque in the left CCA similar to that on the right without stenosis. Soft and calcified plaque at the left ICA origin and bulb resulting in up to 60 % stenosis with respect to the distal vessel. Left ICA remains patent to the skull base. Vertebral arteries: Mild if any proximal right subclavian artery plaque with no stenosis there or at the right vertebral artery origin. The right vertebral appears dominant and is patent to the skull base without stenosis. Soft and calcified plaque in the proximal left subclavian artery not resulting in significant subclavian  artery stenosis, but there is moderate to severe stenosis at the left vertebral artery origin and V1 segment due to mostly soft plaque (series 6, image 154). The distal left V1 segment is also diminutive and stenotic (image 151). The left V2 segment is intermittently diminutive and stenotic, but the vessel remains patent to the skull base. CTA HEAD Posterior circulation: The right vertebral artery is patent to the vertebrobasilar junction without stenosis. The right PICA origin remains patent. The left V4 segment is occluded distal to the patent left PICA but proximal to the vertebrobasilar junction on series 5, image 132. The basilar artery remains patent to the SCA origins. The left PCA is occluded just beyond its origin (series 9, image 26). No reconstitution. And the right PCA is functionally occluded at its origin with only minimal irregular right PCA branch enhancement. Anterior circulation: Both ICA siphons are patent. On the left there is moderate to severe circumferential calcified plaque in the cavernous segment resulting in moderate to severe stenosis proximal to the anterior genu (series 5, image 102). Moderate additional left supraclinoid segment stenosis. On the right there is similar advanced siphon atherosclerosis with moderate right cavernous and moderate to severe right supraclinoid segment stenosis. Patent right ICA terminus. There is a patent small left posterior communicating artery. Patent carotid termini. Patent MCA and ACA origins. Left A1 is mildly dominant. Anterior communicating artery is patent with a median artery of the corpus callosum. Bilateral ACA branches are patent with mild irregularity. Left MCA M1 segment and left MCA bifurcation are patent without stenosis. Left MCA M3 branches are mildly to moderately irregular. Right MCA M1 segment and bifurcation are patent without stenosis. Right MCA M3 branches are also mildly to moderately irregular. Venous sinuses: Early contrast timing,  grossly patent assuming dominant right side transverse and sigmoid sinuses. Anatomic variants: Median artery of the corpus callosum. Review of the MIP images confirms the above findings  IMPRESSION: 1. Positive for acute appearing occlusions of the distal Left Vertebral Artery and Left PCA: - distal left V4 occlusion, with upstream multifocal moderate and severe left vertebral stenosis in the neck. - left PCA P1 occlusion just beyond the origin. 2. CTP detects only some of the left PCA territory core infarct which was visible by plain CT. 3. 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 The right vertebral artery and basilar arteries are patent without stenosis. 4. 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 5. Mild to moderate irregularity of the bilateral MCA branches. 6. Small bilateral pleural effusions with increased pulmonary septal thickening suggestive of pulmonary edema. 7. 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 Recommend follow-up with ENT. Preliminary results of the arterial findings were text paged to Dr. Lorraine Lax via Shea Evans at 1503 hours, and again at 1518 hours. Electronically Signed   By: Genevie Ann M.D.   On: 06/18/2019 15:26   CT Code Stroke Cerebral Perfusion with contrast  Result Date: 06/18/2019 CLINICAL DATA:  74 year old male code stroke presentation with right side weakness. EXAM: CT ANGIOGRAPHY HEAD AND NECK CT PERFUSION BRAIN TECHNIQUE: Multidetector CT imaging of the head and neck was performed using the standard protocol during bolus administration of intravenous contrast. Multiplanar CT image reconstructions and MIPs were obtained to evaluate the vascular anatomy. Carotid stenosis measurements (when applicable) are obtained utilizing NASCET criteria, using the distal internal carotid diameter as the  denominator. Multiphase CT imaging of the brain was performed following IV bolus contrast injection. Subsequent parametric perfusion maps were calculated using RAPID software. CONTRAST:  100 milliliters Omnipaque 350 COMPARISON:  Plain head CT 1448 hours today. FINDINGS: CT Brain Perfusion Findings: ASPECTS: 10, although a large left PCA territory infarct was suspected by plain CT. CBF (<30%) Volume: 42mL, corresponding to a portion of the left PCA territory in the occipital lobe Perfusion (Tmax>6.0s) volume: 66mL, although some of this is likely artifactual involving the right temporal lobe portion of which is chronically abnormal. Mismatch Volume: 68mL Infarction Location:Left PCA CTA NECK Skeleton: Carious dentition. No acute osseous abnormality identified. Upper chest: Small bilateral layering pleural effusions. Septal thickening and mild additional peribronchial opacity near the hila in the upper lobes. No superior mediastinal lymphadenopathy. Other neck: Retropharyngeal course of both carotid arteries. Small round 8 millimeter intermediate density soft tissue nodule in the superficial lobe of the left parotid gland. No cervical lymphadenopathy or other neck mass. Aortic arch: Mild Calcified aortic atherosclerosis. 3 vessel arch configuration. Right carotid system: Mild motion artifact at the right CCA origin. Circumferential soft plaque in the right CCA proximal to the bifurcation as it has a retropharyngeal course on series 3, image 125, but less than 50 % stenosis with respect to the distal vessel. Superimposed calcified plaque at the right carotid bifurcation, and bulky additional soft plaque in the right ICA bulb resulting in a short segment high-grade stenosis approaching a radiographic string sign on series 3, image 94. The right ICA remains patent to the skull base. Left carotid system: Normal left CCA origin. Circumferential soft plaque in the left CCA similar to that on the right without stenosis. Soft  and calcified plaque at the left ICA origin and bulb resulting in up to 60 % stenosis with respect to the distal vessel. Left ICA remains patent to the skull base. Vertebral arteries: Mild  if any proximal right subclavian artery plaque with no stenosis there or at the right vertebral artery origin. The right vertebral appears dominant and is patent to the skull base without stenosis. Soft and calcified plaque in the proximal left subclavian artery not resulting in significant subclavian artery stenosis, but there is moderate to severe stenosis at the left vertebral artery origin and V1 segment due to mostly soft plaque (series 6, image 154). The distal left V1 segment is also diminutive and stenotic (image 151). The left V2 segment is intermittently diminutive and stenotic, but the vessel remains patent to the skull base. CTA HEAD Posterior circulation: The right vertebral artery is patent to the vertebrobasilar junction without stenosis. The right PICA origin remains patent. The left V4 segment is occluded distal to the patent left PICA but proximal to the vertebrobasilar junction on series 5, image 132. The basilar artery remains patent to the SCA origins. The left PCA is occluded just beyond its origin (series 9, image 26). No reconstitution. And the right PCA is functionally occluded at its origin with only minimal irregular right PCA branch enhancement. Anterior circulation: Both ICA siphons are patent. On the left there is moderate to severe circumferential calcified plaque in the cavernous segment resulting in moderate to severe stenosis proximal to the anterior genu (series 5, image 102). Moderate additional left supraclinoid segment stenosis. On the right there is similar advanced siphon atherosclerosis with moderate right cavernous and moderate to severe right supraclinoid segment stenosis. Patent right ICA terminus. There is a patent small left posterior communicating artery. Patent carotid termini.  Patent MCA and ACA origins. Left A1 is mildly dominant. Anterior communicating artery is patent with a median artery of the corpus callosum. Bilateral ACA branches are patent with mild irregularity. Left MCA M1 segment and left MCA bifurcation are patent without stenosis. Left MCA M3 branches are mildly to moderately irregular. Right MCA M1 segment and bifurcation are patent without stenosis. Right MCA M3 branches are also mildly to moderately irregular. Venous sinuses: Early contrast timing, grossly patent assuming dominant right side transverse and sigmoid sinuses. Anatomic variants: Median artery of the corpus callosum. Review of the MIP images confirms the above findings IMPRESSION: 1. Positive for acute appearing occlusions of the distal Left Vertebral Artery and Left PCA: - distal left V4 occlusion, with upstream multifocal moderate and severe left vertebral stenosis in the neck. - left PCA P1 occlusion just beyond the origin. 2. CTP detects only some of the left PCA territory core infarct which was visible by plain CT. 3. 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 The right vertebral artery and basilar arteries are patent without stenosis. 4. 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 5. Mild to moderate irregularity of the bilateral MCA branches. 6. Small bilateral pleural effusions with increased pulmonary septal thickening suggestive of pulmonary edema. 7. 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 Recommend follow-up with ENT. Preliminary results of the arterial findings were text paged to Dr. Lorraine Lax via Shea Evans at 1503 hours, and again at 1518 hours. Electronically Signed   By: Genevie Ann M.D.   On: 06/18/2019 15:26   CT HEAD CODE STROKE WO CONTRAST  Result Date: 06/18/2019 CLINICAL DATA:  Code stroke. 73 year old male with  right side weakness and facial droop. EXAM: CT HEAD WITHOUT CONTRAST TECHNIQUE: Contiguous axial images were  obtained from the base of the skull through the vertex without intravenous contrast. COMPARISON:  None. FINDINGS: Brain: Large area of hypodense cytotoxic edema demonstrated from the mesial left temporal lobe through the left occipital pole in keeping with a large left PCA territory infarct. Mild regional mass effect. No associated hemorrhage. Comparatively mild hypodensity in the left thalamus. Superimposed chronic appearing encephalomalacia in the right temporal and occipital lobes. No MCA territory acute cortically based infarct changes identified. No acute intracranial hemorrhage identified. No ventriculomegaly. No midline shift and patent basilar cisterns. Vascular: Calcified atherosclerosis at the skull base. No suspicious intracranial vascular hyperdensity. Skull: No acute osseous abnormality identified. Sinuses/Orbits: Well pneumatized paranasal sinuses. Tympanic cavities and mastoids are clear. Other: Visualized orbits and scalp soft tissues are within normal limits. ASPECTS [facility] Stroke Program Early CT Score) Total score (0-10 with 10 being normal): 10, but not applicable in this case given evidence of large acute left PCA infarct. IMPRESSION: 1. Large acute Left PCA territory infarct. No associated hemorrhage. Minor mass effect. 2. No acute anterior circulation infarct is evident (ASPECTS 10). 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 3. These results were communicated to Dr. Lorraine Lax at 2:59 pm on 06/18/2019 by text page via the [facility] messaging system. Electronically Signed   By: Genevie Ann M.D.   On: 06/18/2019 15:02    Assessment and Plan:   1. Stroke - pt outside the window for TPA, not done - seen by Neuro, stroke workup ongoing  - ASA and echo ordered (echo not done yet) -Review of CTA head shows Superimposed calcified plaque at the right carotid bifurcation, and bulky  additional soft plaque in the right ICA bulb resulting in a short segment high-grade stenosis approaching a radiographic string sign on series 3, image 94.  Also with Soft and calcified plaque at the left ICA origin and bulb resulting in up to 60 % stenosis with respect to the distal vessel. -Consider the VVS consult once he recovers from the acute stroke - per IM/Neuro  2. 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   SignedRosaria Ferries, PA-C  06/18/2019 5:04 PM  Patient seen, examined. Available data reviewed. Agree with findings, assessment, and plan as outlined by Rosaria Ferries, PA-C.  The patient is independently interviewed and examined.  On my examination, he is aphasic and unable to provide any history.  He is not able to tell me if he is having chest pain but he does not appear to be uncomfortable.  HEENT with right facial droop, neck with bilateral carotid bruits and normal JVP, lungs with bilateral wheezing, heart is regular rate and rhythm no murmur gallop, abdomen is soft and nontender with no organomegaly, obese.  Extremities have no edema.  EKG shows normal sinus rhythm with acute inferior injury and age-indeterminate anteroseptal MI.  Troponin is markedly elevated at 12,799.  Unfortunately, patient appears to have  findings  consistent with an acute inferior STEMI in the setting of an acute stroke.  He is not a candidate for invasive evaluation with cardiac catheterization and PCI because of his current condition related to his stroke.  I spoke with Dr. Lorraine Lax with neurology and we discussed consideration of antithrombotic therapy with heparin.  While there is some risk of hemorrhagic transformation, we feel the benefit in the setting of an acute MI outweighs the risk and he will be treated with IV heparin without bolus.  There will be close follow-up of a CT scan of the brain tomorrow morning to make sure there is no hemorrhagic transformation.  Patient will be treated with aspirin and when safe we should start him on clopidogrel.  We will check an echocardiogram and cycle cardiac markers.  I have updated his significant other on his condition.  Sherren Mocha, M.D. 06/18/2019 5:42 PM

## 2019-06-18 NOTE — Progress Notes (Signed)
ANTICOAGULATION CONSULT NOTE - Initial Consult  Pharmacy Consult for heparin Indication: chest pain/ACS  Patient Measurements: Heparin Dosing Weight:   Vital Signs: BP: 114/75 (01/08 1600) Pulse Rate: 69 (01/08 1600)  Labs: Recent Labs    06/18/19 1439 06/18/19 1447 06/18/19 1531 06/18/19 1619  HGB 13.9 14.6  --  15.6  HCT 42.7 43.0  --  46.0  PLT 243  --   --   --   APTT 26  --   --   --   LABPROT 13.9  --   --   --   INR 1.1  --   --   --   CREATININE 1.66* 1.50*  --   --   TROPONINIHS  --   --  12,799*  --      Medical History: Past Medical History:  Diagnosis Date  . 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 He was originally called as a code stroke - he was found to have a L PCA infarct. tPA was not given secondary to last seen normal. Later he was found to have an abnormal ECG which was diagnosed as a STEMI. He is unable to go for cardiac cath due to acute stroke. We will begin heparin conservatively in the meantime. SCr 1.5, CBC stable.    Goal of Therapy:  Heparin level 0.3-0.5 units/mL Monitor platelets by anticoagulation protocol: Yes    Plan:  -Heparin 1200 units/hr -Daily HL, CBC -Check level in the morning -No boluses, aim for low goal for the first couple days    Harvel Quale 06/18/2019,6:00 PM

## 2019-06-18 NOTE — Code Documentation (Signed)
Stroke Response Nurse Documentation Code Documentation  Levi Pruitt is a 74 y.o. male arriving to [facility] ED via [facility] on 06/18/2019 with unknown past medical hx. Code stroke was activated by EMS. Patient from home where he was LKW at 1700 yesterday when he was on the phone with his daughter. Neighbors noted papers on the front door and called EMS to check on patient. Stroke team at the bedside on patient arrival. Labs drawn and patient cleared for CT by EDP. Patient to CT with team. NIHSS 19, see documentation for details and code stroke times. Patient with disoriented, not following commands, left gaze preference , right hemianopia, right facial droop, right arm weakness, right leg weakness, right decreased sensation, Global aphasia  and dysarthria  on exam. The following imaging was completed: CT, CTA head and neck, CTP. Patient is not a candidate for tPA due to outside window. Pt taken back to ED and placed on cardiac monitor. Rapid Response at patients bedside continuing care. Bedside handoff with ED RN Arby Barrette.    Kathrin Greathouse  Stroke Response RN

## 2019-06-18 NOTE — ED Notes (Signed)
Pt has been repositioned in low fowlers to assist his breathing to be more comfortable, audible wheezing heard while at the bedside. Pt resting at this time, Zole pads in place, monitor on & reading NSR with ST elevation.

## 2019-06-18 NOTE — ED Provider Notes (Signed)
[place] EMERGENCY DEPARTMENT Provider Note   CSN: [id number] Arrival date & time: 06/18/19  1436     History Chief Complaint  Patient presents with  . Code Stroke     Levi Pruitt is a 74 y.o. male with unknown past medical history, presenting to the emergency department via EMS as a code stroke.  Unable to obtain history from patient due to aphasia. Per EMS, patient's daughter who lives out of state reports she spoke with him last night at 5 PM.  She states she did not hear from him this morning and neighbors reportedly noticed he did not get his newspaper therefore EMS was called for wellness check.  On EMS arrival they had to force entry and found patient laying in the bed aphasic with right-sided facial droop and flaccid RUE.  Per EMS, patient does not seek regular outpatient medical care.  The history is provided by the EMS personnel. The history is limited by the condition of the patient.   Level 5 caveat secondary to altered mental status.     History reviewed. No pertinent past medical history.  There are no problems to display for this patient.   History reviewed. No pertinent surgical history.     History reviewed. No pertinent family history.  Social History   Tobacco Use  . Smoking status: Not on file  Substance Use Topics  . Alcohol use: Not on file  . Drug use: Not on file    Home Medications Prior to Admission medications   Not on File    Allergies    Patient has no allergy information on record.  Review of Systems   Review of Systems  Unable to perform ROS: Mental status change    Physical Exam Updated Vital Signs There were no vitals taken for this visit.  Physical Exam Vitals and nursing note reviewed.  Constitutional:      Appearance: He is well-developed.  HENT:     Head: Normocephalic and atraumatic.  Eyes:     Conjunctiva/sclera: Conjunctivae normal.  Cardiovascular:     Rate and Rhythm: Normal rate and  regular rhythm.  Pulmonary:     Effort: Pulmonary effort is normal. No respiratory distress.     Breath sounds: Normal breath sounds.  Abdominal:     Palpations: Abdomen is soft.  Skin:    General: Skin is warm.  Neurological:     Mental Status: He is alert.     Comments: Patient is aphasic with slurred speech.  He is able to tell me his name though it is quite slurred.  He is following some simple commands. Obvious right-sided facial droop.  Right upper and right lower extremity flaccid.  Patient is able to grip with left hand and lift left leg off of the bed.  Psychiatric:        Behavior: Behavior normal.     ED Results / Procedures / Treatments   Labs (all labs ordered are listed, but only abnormal results are displayed) Labs Reviewed  CBC - Abnormal; Notable for the following components:      Result Value   WBC 17.3 (*)    All other components within normal limits  DIFFERENTIAL - Abnormal; Notable for the following components:   Neutro Abs 13.8 (*)    Monocytes Absolute 2.0 (*)    Abs Immature Granulocytes 0.13 (*)    All other components within normal limits  COMPREHENSIVE METABOLIC PANEL - Abnormal; Notable for the following components:  CO2 20 (*)    Glucose, Bld 102 (*)    BUN 43 (*)    Creatinine, Ser 1.66 (*)    Calcium 8.5 (*)    Albumin 2.9 (*)    AST 73 (*)    ALT 60 (*)    GFR calc non Af Amer 40 (*)    GFR calc Af Amer 47 (*)    All other components within normal limits  I-STAT CHEM 8, ED - Abnormal; Notable for the following components:   BUN 41 (*)    Creatinine, Ser 1.50 (*)    Calcium, Ion 1.01 (*)    All other components within normal limits  RESPIRATORY PANEL BY RT PCR (FLU A&B, COVID)  PROTIME-INR  APTT  CBG MONITORING, ED  CBG MONITORING, ED    EKG EKG Interpretation  Date/Time:  Friday June 18 2019 15:12:14 EST Ventricular Rate:  85 PR Interval:    QRS Duration: 111 QT Interval:  382 QTC Calculation: 455 R Axis:   109 Text  Interpretation: Sinus rhythm Left atrial enlargement Inferior infarct, acute (RCA) Anteroseptal infarct, age indeterminate Probable RV involvement, suggest recording right precordial leads >>> Acute MI <<< No old tracing to compare Confirmed by Aletta Edouard [phone]) on 06/18/2019 3:19:01 PM   Radiology CT HEAD CODE STROKE WO CONTRAST  Result Date: 06/18/2019 CLINICAL DATA:  Code stroke. 75 year old male with right side weakness and facial droop. EXAM: CT HEAD WITHOUT CONTRAST TECHNIQUE: Contiguous axial images were obtained from the base of the skull through the vertex without intravenous contrast. COMPARISON:  None. FINDINGS: Brain: Large area of hypodense cytotoxic edema demonstrated from the mesial left temporal lobe through the left occipital pole in keeping with a large left PCA territory infarct. Mild regional mass effect. No associated hemorrhage. Comparatively mild hypodensity in the left thalamus. Superimposed chronic appearing encephalomalacia in the right temporal and occipital lobes. No MCA territory acute cortically based infarct changes identified. No acute intracranial hemorrhage identified. No ventriculomegaly. No midline shift and patent basilar cisterns. Vascular: Calcified atherosclerosis at the skull base. No suspicious intracranial vascular hyperdensity. Skull: No acute osseous abnormality identified. Sinuses/Orbits: Well pneumatized paranasal sinuses. Tympanic cavities and mastoids are clear. Other: Visualized orbits and scalp soft tissues are within normal limits. ASPECTS [facility] Stroke Program Early CT Score) Total score (0-10 with 10 being normal): 10, but not applicable in this case given evidence of large acute left PCA infarct. IMPRESSION: 1. Large acute Left PCA territory infarct. No associated hemorrhage. Minor mass effect. 2. No acute anterior circulation infarct is evident (ASPECTS 10). 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 3. These results were communicated  to Dr. Lorraine Lax at 2:59 pm on 06/18/2019 by text page via the [facility] messaging system. Electronically Signed   By: Genevie Ann M.D.   On: 06/18/2019 15:02    Procedures .Critical Care Performed by: English Craighead, [place] N, PA-C Authorized by: Nery Frappier, [place] N, PA-C   Critical care provider statement:    Critical care time (minutes):  35   Critical care time was exclusive of:  Separately billable procedures and treating other patients and teaching time   Critical care was necessary to treat or prevent imminent or life-threatening deterioration of the following conditions:  CNS failure or compromise   Critical care was time spent personally by me on the following activities:  Discussions with consultants, evaluation of patient's response to treatment, examination of patient, ordering and performing treatments and interventions, ordering and review of laboratory studies, ordering and  review of radiographic studies, pulse oximetry, re-evaluation of patient's condition, obtaining history from patient or surrogate and review of old charts   I assumed direction of critical care for this patient from another provider in my specialty: no     (including critical care time)  Medications Ordered in ED Medications  sodium chloride flush (NS) 0.9 % injection 3 mL (has no administration in time range)  iohexol (OMNIPAQUE) 350 MG/ML injection 100 mL (100 mLs Intravenous Contrast Given 06/18/19 1506)    ED Course  I have reviewed the triage vital signs and the nursing notes.  Pertinent labs & imaging results that were available during my care of the patient were reviewed by me and considered in my medical decision making (see chart for details).  Clinical Course as of Jun 17 1525  Fri Jun 17, 4218  6629 74 year old male brought in by EMS for code stroke activation.  Last known well 5 PM.  EMS did a wellness check today found him with garbled speech right sided weakness and facial droop.  He was evaluated by myself on  arrival along with neurology.  He is getting emergent imaging and labs.  He will need to be admitted to the hospital.   [MB]  1519 Large PCA stroke, neuro does not recommend heparin at this time. Pt not a candidate for intervention.    [JR]  1519 Acute inferior lead ST elevation with reciprocal changes. Dr. Sedonia Small consulting with cardiology for recommendations and will assume care at this time for shift change.  EKG 12-Lead [JR]    Clinical Course User Index [JR] Ching Rabideau, [place] N, PA-C [MB] Hayden Rasmussen, MD   MDM Rules/Calculators/A&P                      Patient presenting via EMS as code stroke with right-sided facial droop, right upper and lower extremity weakness and aphasia.  Last known normal per EMS is 5 PM yesterday evening when patient's daughter spoke with him on the phone.  On exam he has very slurred speech, is able to follow some simple commands with right upper and lower extremity flaccidity, right-sided facial droop.  CT imaging reveals large left PCA stroke.  Neurologist, Dr. Lorraine Lax recommends patient is not a candidate  for intervention at this time.  Recommends heparin be held.  Upon shift change, EKG shows acute inferior lead ST elevation with reciprocal changes. Dr. Sedonia Small assuming care at this time, and emergently consulting cardiology for further management.   Final Clinical Impression(s) / ED Diagnoses Final diagnoses:  Acute ischemic stroke [facility])  Abnormal EKG    Rx / DC Orders ED Discharge Orders    None       Yaniris Braddock, [place] N, PA-C 06/18/19 1530    Hayden Rasmussen, MD 06/18/19 1735

## 2019-06-18 NOTE — Plan of Care (Signed)
Daughters number [employer] [phone]  Lives in [place]

## 2019-06-18 NOTE — Consult Note (Addendum)
NEURO HOSPITALIST  CONSULT   Requesting Physician: Dr. Melina Copa    Chief Complaint:  Slurred speech, right facial droop, right arm flaccid  History obtained from:  EMS  HPI:                                                                                                                                         Levi Pruitt is an 74 y.o. male  No known significant past medical history. Presented to [facility] ED as a code stroke for c/o slurred speech, right arm flaccid.   Daughter spoke to her father on the phone last night 1700 06/18/2019. according to daughter he was normal. He had normal speech. Then today neighbors noticed that news papers were piling up on the porch. They called for a welfare check. EMS had to force their way in. He was found in bed, right facial droop, right arm flaccid and garbled speech.  Per daughter patient does not go to the doctor and he does not take any medications.  Patient lives alone.   ED course:  CTH: left PCA stroke CTA: P1 occlusion BP: 99/60 BG: 104 Modified Rankin: Rankin Score=1 NIHSS:19     No past medical history on file.  History reviewed. No pertinent surgical history.  No family history on file.      Social History:  has no history on file for tobacco, alcohol, and drug.  Allergies: Not on File  Medications:                                                                                                                          Current Facility-Administered Medications  Medication Dose Route Frequency Provider Last Rate Last Admin  . sodium chloride flush (NS) 0.9 % injection 3 mL  3 mL Intravenous Once Clifton James, MD       No current outpatient medications on file.  '   ROS:  unobtainable from patient due to mental status     General Examination:                                                                                                       There were no vitals taken for this visit.  Physical Exam  Constitutional: Appears well-developed and well-nourished.  Psych: Affect appropriate to situation Eyes: Normal external eye and conjunctiva. HENT: Normocephalic, no lesions, without obvious abnormality.   Musculoskeletal-no joint tenderness, deformity or swelling Cardiovascular: Normal rate and regular rhythm.  Respiratory: Effort normal, non-labored breathing saturations WNL GI: Soft.  No distension. There is no tenderness.  Skin: WDI  Neurological Examination  Mental Status: Alert, oriented to name. Severe dysarthria, Able to follow some simple commands. Expressive aphasia noted. Cranial Nerves:  complete hemianopsia, ptosis not present. Left gaze preference. Left facial droop Motor/ Sensory: RUE flaccid.  Right lower extremity has 4 out of 5 strength.  LUE and LLE able to sustain antigravity.   Plantars: Right: downgoing   Left: downgoing Cerebellar: UTA fully, but no ataxia noted Gait: deferred   Lab Results: Basic Metabolic Panel: Recent Labs  Lab 06/18/19 1447  NA 137  K 3.6  CL 104  GLUCOSE 95  BUN 41*  CREATININE 1.50*    CBC: Recent Labs  Lab 06/18/19 1439 06/18/19 1447  WBC 17.3*  --   NEUTROABS 13.8*  --   HGB 13.9 14.6  HCT 42.7 43.0  MCV 97.3  --   PLT 243  --     CBG: Recent Labs  Lab 06/18/19 1442 06/18/19 1451  GLUCAP 91 86    Imaging: CT HEAD CODE STROKE WO CONTRAST  Result Date: 06/18/2019 CLINICAL DATA:  Code stroke. 74 year old male with right side weakness and facial droop. EXAM: CT HEAD WITHOUT CONTRAST TECHNIQUE: Contiguous axial images were obtained from the base of the skull through the vertex without intravenous contrast. COMPARISON:  None. FINDINGS: Brain: Large area of hypodense cytotoxic edema demonstrated from the mesial left temporal lobe through the left occipital  pole in keeping with a large left PCA territory infarct. Mild regional mass effect. No associated hemorrhage. Comparatively mild hypodensity in the left thalamus. Superimposed chronic appearing encephalomalacia in the right temporal and occipital lobes. No MCA territory acute cortically based infarct changes identified. No acute intracranial hemorrhage identified. No ventriculomegaly. No midline shift and patent basilar cisterns. Vascular: Calcified atherosclerosis at the skull base. No suspicious intracranial vascular hyperdensity. Skull: No acute osseous abnormality identified. Sinuses/Orbits: Well pneumatized paranasal sinuses. Tympanic cavities and mastoids are clear. Other: Visualized orbits and scalp soft tissues are within normal limits. ASPECTS [facility] Stroke Program Early CT Score) Total score (0-10 with 10 being normal): 10, but not applicable in this case given evidence of large acute left PCA infarct. IMPRESSION: 1. Large acute Left PCA territory infarct. No associated hemorrhage. Minor mass effect. 2. No acute anterior circulation infarct is evident (ASPECTS 10). 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 3. These results were communicated to Dr. Lorraine Lax at 2:59 pm on 06/18/2019 by text page  via the [employer] system. Electronically Signed   By: Genevie Ann M.D.   On: 06/18/2019 15:02       Laurey Morale, MSN, NP-C [facility] [phone]  06/18/2019, 2:55 PM   Attending physician note to follow with Assessment and plan .  NEUROHOSPITALIST ADDENDUM Performed a face to face diagnostic evaluation at the time of code stroke.  I have reviewed the contents of history and physical exam as documented by PA/ARNP/Resident and agree with above documentation.  I have discussed and formulated the above plan as documented. Edits to the note have been made as needed.   74 year old male with unknown past medical history, apparently not on any home medications brought by EMS  after neighbors called him for a welfare check.  Patient's daughter last spoke on the phone at 5 PM last night. EMS found patient lying in bed, nonverbal with a right arm flaccid and left gaze deviation.  Was brought in as a code stroke due to LVO positive.  CT head was obtained which showed an established large left PCA infarct with CTA confirming P1 occlusion.  Unfortunately patient not a candidate for IV TPA as outside window or thrombectomy due to infarct being established.  Assessment: 74 y.o. male with no known significant PMH presents with large left PCA infarct secondary to P1 occlusion. Patient also appears to have ST segment elevation in the inferior leads and cardiology has been consulted.  Left PCA acute ischemic infarct -- BP goal : Permissive HTN upto 220/110 mmHg (for 24-48 post admission )   --MRI Brain  --Echocardiogram -- ASA 81 mg; caution use of any additional antiplatelet or anticoagulation d/t large established PCA stroke and risk for bleeding. -- High intensity Statin if LDL > 70 -- HgbA1c, fasting lipid panel -- PT consult, OT consult, Speech consult --Telemetry monitoring --Frequent neuro checks --N.p.o. until he passes swallow evaluation  EKG concerning for ST elevation in inferior leads - Discussed with cardiology, if heparin needed:  recommend non-bolus heparin drip to minimize risk of hemorrhagic conversion of large stroke.  Cardiology felt patient not a candidate for emergent heart cath and PCI due to large stroke.  Karena Addison Min Tunnell MD [facility] [phone]   If 7pm to 7am, please call on call as listed on AMION.  --please page stroke NP  Or  PA  Or MD from 8am -4 pm  as this patient from this time will be  followed by the stroke.   You can look them up on [email or web address]  Password [email or web address]

## 2019-06-18 NOTE — Code Documentation (Signed)
Code Stroke Note  Pt LKW at 1700, pt spoke with daughter on the phone at that time. EMS arrived for well check and found pt to have garbled speech and unable to move his right side. Code stroke called en route at 1434. Pt arrived at 1436. Met at bridge by stroke team and Dr. Lorraine Lax. Pt sent to CT. NIHSS 20, see documentation in flowsheet. Speech garbled. Pt disoriented to name and month. Unable to follow simple commands. Forced deviation to the left side. Minor facial palsy with droop on the right side noted at right lip. No effort against gravity on his right arm, no drift to left arm. Drift to left leg, and some attempt at resistance to gravity to right leg. Partial sensory loss. Moderate aphasia. Speech garbled and unintelligible. Partial neglect.   CTA/CTP done. Pt not a TPA candidate d/t being outside the time window. Continue q2h VS and neuro checks x12 hours.

## 2019-06-19 ENCOUNTER — Inpatient Hospital Stay (HOSPITAL_COMMUNITY): Payer: Medicare Other

## 2019-06-19 DIAGNOSIS — N179 Acute kidney failure, unspecified: Secondary | ICD-10-CM

## 2019-06-19 DIAGNOSIS — I340 Nonrheumatic mitral (valve) insufficiency: Secondary | ICD-10-CM

## 2019-06-19 DIAGNOSIS — G9340 Encephalopathy, unspecified: Secondary | ICD-10-CM

## 2019-06-19 LAB — HIV ANTIBODY (ROUTINE TESTING W REFLEX): HIV Screen 4th Generation wRfx: NONREACTIVE

## 2019-06-19 LAB — COMPREHENSIVE METABOLIC PANEL
ALT: 59 U/L — ABNORMAL HIGH (ref 0–44)
AST: 63 U/L — ABNORMAL HIGH (ref 15–41)
Albumin: 2.7 g/dL — ABNORMAL LOW (ref 3.5–5.0)
Alkaline Phosphatase: 68 U/L (ref 38–126)
Anion gap: 12 (ref 5–15)
BUN: 38 mg/dL — ABNORMAL HIGH (ref 8–23)
CO2: 20 mmol/L — ABNORMAL LOW (ref 22–32)
Calcium: 8.3 mg/dL — ABNORMAL LOW (ref 8.9–10.3)
Chloride: 107 mmol/L (ref 98–111)
Creatinine, Ser: 1.38 mg/dL — ABNORMAL HIGH (ref 0.61–1.24)
GFR calc Af Amer: 58 mL/min — ABNORMAL LOW (ref >60)
GFR calc non Af Amer: 50 mL/min — ABNORMAL LOW (ref >60)
Glucose, Bld: 96 mg/dL (ref 70–99)
Potassium: 4 mmol/L (ref 3.5–5.1)
Sodium: 139 mmol/L (ref 135–145)
Total Bilirubin: 1.1 mg/dL (ref 0.3–1.2)
Total Protein: 6.6 g/dL (ref 6.5–8.1)

## 2019-06-19 LAB — POCT I-STAT 7, (LYTES, BLD GAS, ICA,H+H)
Acid-base deficit: 3 mmol/L — ABNORMAL HIGH (ref 0.0–2.0)
Bicarbonate: 19.6 mmol/L — ABNORMAL LOW (ref 20.0–28.0)
Calcium, Ion: 1.15 mmol/L (ref 1.15–1.40)
HCT: 39 % (ref 39.0–52.0)
Hemoglobin: 13.3 g/dL (ref 13.0–17.0)
O2 Saturation: 96 %
Patient temperature: 97.6
Potassium: 4 mmol/L (ref 3.5–5.1)
Sodium: 141 mmol/L (ref 135–145)
TCO2: 20 mmol/L — ABNORMAL LOW (ref 22–32)
pCO2 arterial: 27 mmHg — ABNORMAL LOW (ref 32.0–48.0)
pH, Arterial: 7.466 — ABNORMAL HIGH (ref 7.350–7.450)
pO2, Arterial: 74 mmHg — ABNORMAL LOW (ref 83.0–108.0)

## 2019-06-19 LAB — CBC
HCT: 43.7 % (ref 39.0–52.0)
Hemoglobin: 13.6 g/dL (ref 13.0–17.0)
MCH: 31.4 pg (ref 26.0–34.0)
MCHC: 31.1 g/dL (ref 30.0–36.0)
MCV: 100.9 fL — ABNORMAL HIGH (ref 80.0–100.0)
Platelets: 239 10*3/uL (ref 150–400)
RBC: 4.33 MIL/uL (ref 4.22–5.81)
RDW: 13.2 % (ref 11.5–15.5)
WBC: 15.7 10*3/uL — ABNORMAL HIGH (ref 4.0–10.5)
nRBC: 0 % (ref 0.0–0.2)

## 2019-06-19 LAB — ECHOCARDIOGRAM COMPLETE
Height: 69 in
Weight: 3432 oz

## 2019-06-19 LAB — LIPID PANEL
Cholesterol: 152 mg/dL (ref 0–200)
HDL: 32 mg/dL — ABNORMAL LOW (ref >40)
LDL Cholesterol: 99 mg/dL (ref 0–99)
Total CHOL/HDL Ratio: 4.8 RATIO
Triglycerides: 107 mg/dL (ref <150)
VLDL: 21 mg/dL (ref 0–40)

## 2019-06-19 LAB — URINALYSIS, ROUTINE W REFLEX MICROSCOPIC
Bacteria, UA: NONE SEEN
Bilirubin Urine: NEGATIVE
Glucose, UA: NEGATIVE mg/dL
Hgb urine dipstick: NEGATIVE
Ketones, ur: 20 mg/dL — AB
Leukocytes,Ua: NEGATIVE
Nitrite: NEGATIVE
Protein, ur: 30 mg/dL — AB
Specific Gravity, Urine: 1.029 (ref 1.005–1.030)
pH: 5 (ref 5.0–8.0)

## 2019-06-19 LAB — BRAIN NATRIURETIC PEPTIDE: B Natriuretic Peptide: 756.3 pg/mL — ABNORMAL HIGH (ref 0.0–100.0)

## 2019-06-19 LAB — AMMONIA: Ammonia: 12 umol/L (ref 9–35)

## 2019-06-19 LAB — TROPONIN I (HIGH SENSITIVITY): Troponin I (High Sensitivity): 8791 ng/L (ref <18)

## 2019-06-19 LAB — HEPARIN LEVEL (UNFRACTIONATED)
Heparin Unfractionated: 0.1 IU/mL — ABNORMAL LOW (ref 0.30–0.70)
Heparin Unfractionated: <0.1 IU/mL — ABNORMAL LOW (ref 0.30–0.70)

## 2019-06-19 LAB — HEMOGLOBIN A1C
Hgb A1c MFr Bld: 5.3 % (ref 4.8–5.6)
Mean Plasma Glucose: 105.41 mg/dL

## 2019-06-19 LAB — PROCALCITONIN: Procalcitonin: 0.3 ng/mL

## 2019-06-19 LAB — GLUCOSE, CAPILLARY
Glucose-Capillary: 85 mg/dL (ref 70–99)
Glucose-Capillary: 87 mg/dL (ref 70–99)

## 2019-06-19 LAB — MRSA PCR SCREENING: MRSA by PCR: NEGATIVE

## 2019-06-19 MED ORDER — IPRATROPIUM-ALBUTEROL 0.5-2.5 (3) MG/3ML IN SOLN
3.0000 mL | Freq: Four times a day (QID) | RESPIRATORY_TRACT | Status: DC
  Administered 2019-06-19 – 2019-06-20 (×3): 3 mL via RESPIRATORY_TRACT
  Filled 2019-06-19 (×3): qty 3

## 2019-06-19 MED ORDER — SODIUM CHLORIDE 0.9 % IV SOLN
1.0000 g | INTRAVENOUS | Status: DC
  Administered 2019-06-19 – 2019-07-01 (×13): 1 g via INTRAVENOUS
  Filled 2019-06-19 (×4): qty 1
  Filled 2019-06-19 (×2): qty 10
  Filled 2019-06-19: qty 1
  Filled 2019-06-19: qty 10
  Filled 2019-06-19: qty 1
  Filled 2019-06-19: qty 10
  Filled 2019-06-19: qty 1
  Filled 2019-06-19: qty 10
  Filled 2019-06-19: qty 1
  Filled 2019-06-19: qty 10

## 2019-06-19 MED ORDER — SODIUM CHLORIDE 0.9 % IV SOLN
500.0000 mg | INTRAVENOUS | Status: DC
  Administered 2019-06-19 – 2019-06-27 (×9): 500 mg via INTRAVENOUS
  Filled 2019-06-19 (×9): qty 500

## 2019-06-19 MED ORDER — IPRATROPIUM-ALBUTEROL 0.5-2.5 (3) MG/3ML IN SOLN
3.0000 mL | Freq: Four times a day (QID) | RESPIRATORY_TRACT | Status: DC | PRN
  Administered 2019-06-20 – 2019-07-02 (×6): 3 mL via RESPIRATORY_TRACT
  Filled 2019-06-19 (×7): qty 3

## 2019-06-19 MED ORDER — JEVITY 1.2 CAL PO LIQD
1000.0000 mL | ORAL | Status: DC
  Filled 2019-06-19 (×3): qty 1000

## 2019-06-19 MED ORDER — FUROSEMIDE 10 MG/ML IJ SOLN
40.0000 mg | Freq: Once | INTRAMUSCULAR | Status: AC
  Administered 2019-06-20: 40 mg via INTRAVENOUS
  Filled 2019-06-19: qty 4

## 2019-06-19 MED ORDER — FREE WATER: 200.0000 mL | Status: DC

## 2019-06-19 NOTE — ED Notes (Signed)
Patient transported to MRI 

## 2019-06-19 NOTE — ED Notes (Signed)
Pt placed on hospital bed

## 2019-06-19 NOTE — ED Notes (Signed)
Attending paged for change in NIH assessment/pt condition.

## 2019-06-19 NOTE — ED Notes (Signed)
intensivist at bedside.

## 2019-06-19 NOTE — ED Notes (Signed)
Neurology at bedside.

## 2019-06-19 NOTE — ED Notes (Signed)
Attending at bedside.

## 2019-06-19 NOTE — Progress Notes (Signed)
ED nurse reported tube feeding not started yet. Urine not collected. Sputum not collected. Condom cath will be  Placed there d/t pt receiving lasix. Upon receiving patient staff was asked was patient clean and dry which they replied yes. Upon assessing the patient with staff- Pt found with a dried urinated bed linen, soaked brief, additional wet brief stuck in between, no condom catheter. Tube feeding not initiated d/t no observed ng/cortrak placed while pt in ED. Charge nurse made aware and shown items pt came to room soiled in.

## 2019-06-19 NOTE — ED Notes (Signed)
Report attempted 

## 2019-06-19 NOTE — Progress Notes (Signed)
ANTICOAGULATION CONSULT NOTE  Pharmacy Consult for heparin Indication: chest pain/ACS  Patient Measurements: Heparin Dosing Weight: 91 kg  Vital Signs: Temp: 97.6 F (36.4 C) (01/09 1212) Temp Source: Axillary (01/09 1212) BP: 112/64 (01/09 1917) Pulse Rate: 67 (01/09 1917)  Labs: Recent Labs    06/18/19 1439 06/18/19 1439 06/18/19 1447 06/18/19 1531 06/18/19 1619 06/18/19 1828 06/18/19 2212 06/19/19 0000 06/19/19 0422 06/19/19 1248 06/19/19 1515  HGB 13.9  --  14.6   < > 15.6  --   --   --  13.6 13.3  --   HCT 42.7  --  43.0  --  46.0  --   --   --  43.7 39.0  --   PLT 243  --   --   --   --   --   --   --  239  --   --   APTT 26  --   --   --   --   --   --   --   --   --   --   LABPROT 13.9  --   --   --   --   --   --   --   --   --   --   INR 1.1  --   --   --   --   --   --   --   --   --   --   HEPARINUNFRC  --   --   --   --   --   --   --   --  0.10*  --  <0.10*  CREATININE 1.66*  --  1.50*  --   --   --   --   --  1.38*  --   --   TROPONINIHS  --    < >  --   --   --  12,437* 10,883* 8,791*  --   --   --    < > = values in this interval not displayed.     Medical History: Past Medical History:  Diagnosis Date  . 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 He was originally called as a code stroke - he was found to have a L PCA infarct. tPA was not given secondary to last seen normal. Later he was found to have an abnormal ECG which was diagnosed as a STEMI. He is unable to go for cardiac cath due to acute stroke. Heparin was started yesterday. He continues on heparin today but we have yet to reach a therapeutic level. This afternoon's level was undetectable. I spoke to the RN, states it has been running appropriately.   Goal of Therapy:  Heparin level 0.3-0.5 units/mL Monitor platelets by anticoagulation protocol: Yes    Plan:  -Increase heparin to 1700  units/hr -Daily HL, CBC -Check level in the morning -No boluses, aim for low goal for the first couple days    Harvel Quale 06/19/2019,8:01 PM

## 2019-06-19 NOTE — Progress Notes (Signed)
  Echocardiogram 2D Echocardiogram has been performed.  Levi Pruitt A [place] 06/19/2019, 3:04 PM

## 2019-06-19 NOTE — Consult Note (Addendum)
NAME:  Levi Pruitt, MRN:  [id number], DOB:  1945-11-20, LOS: 1 ADMISSION DATE:  06/18/2019, CONSULTATION DATE:  06/19/2019  REFERRING MD:  Bonnell Public, MD, CHIEF COMPLAINT:  Code stroke   History of present illness   The patient is a 74 year old gentleman who lives independently and has undiscovered medical history.  Per his daughter he is a non-smoker he does not typically follow up with medical care.  On Thursday there was some report of feeling unwell and having back pain and weakness.  Friday he was not heard of from close contacts and found down at home during a welfare check.  Past Medical History  Unknown, prior to admission patient was on no home medications.  Consults:  PCCM Neurology  Significant Diagnostic Tests:  MRI brain on 1/9 shows large acute PCA infarct, small acute left frontal lobe infarct, chronic right temporal and right occipital lobe infarcts. Chest x-ray on 1/8 shows some mild cardiomegaly although this is an anterior-posterior view, and there is some left-sided atelectasis  Micro Data:  Covid negative on 1/8  Antimicrobials:   on ceftriaxone and azithromycin  Objective   Blood pressure 138/80, pulse 68, temperature 97.6 F (36.4 C), temperature source Axillary, resp. rate (!) 28, height 5\' 9"  (1.753 m), weight 97.3 kg, SpO2 96 %.       No intake or output data in the 24 hours ending 06/19/19 1400 Filed Weights   06/19/19 0624  Weight: 97.3 kg    Examination: General: Resting comfortably, feels more comfortable sitting up. HENT: Opens eyes and tracks when asked, but prefers to keep in close. Lungs: There to auscultation bilaterally, no wheezes or crackles, he has a good cough Cardiovascular: Regular rate and rhythm, no murmurs rubs or gallops Abdomen: Scaphoid, soft Extremities: No edema Neuro: Right-sided facial droop, right-sided hemiparesis, speech is garbled MSK: Bilateral knee excoriations and ecchymosis Lines/Tubes: Peripheral  IV   Assessment & Plan:  Acute encephalopathy Acute ischemic stroke STEMI, with acute ST segment elevation in the inferior leads II, III and aVF  Plan Heparin GTT for medical management of STEMI.  He has also been ordered aspirin, and a statin. The patient is certainly altered from his stroke, but is able to follow simple commands and does have some waxing waning mental status.  On my exam he is able to give me a good cough, give me a thumbs up, and make purposeful movements.  I think he is safe to go to a neuro progressive floor for ongoing medical management of his STEMI and a stroke.  Does however have the potential to decompensate of course given his life-threatening medical issues. Please do not hesitate to contact PCCM if we can be of further assistance, or if the clinical situation changes.  Labs   CBC: Recent Labs  Lab 06/18/19 1439 06/18/19 1447 06/18/19 1619 06/19/19 0422 06/19/19 1248  WBC 17.3*  --   --  15.7*  --   NEUTROABS 13.8*  --   --   --   --   HGB 13.9 14.6 15.6 13.6 13.3  HCT 42.7 43.0 46.0 43.7 39.0  MCV 97.3  --   --  100.9*  --   PLT 243  --   --  239  --     Basic Metabolic Panel: Recent Labs  Lab 06/18/19 1439 06/18/19 1447 06/18/19 1619 06/19/19 0422 06/19/19 1248  NA 138 137 137 139 141  K 3.7 3.6 4.1 4.0 4.0  CL 105 104  --  107  --   CO2 20*  --   --  20*  --   GLUCOSE 102* 95  --  96  --   BUN 43* 41*  --  38*  --   CREATININE 1.66* 1.50*  --  1.38*  --   CALCIUM 8.5*  --   --  8.3*  --    GFR: Estimated Creatinine Clearance: 54.8 mL/min (A) (by C-G formula based on SCr of 1.38 mg/dL (H)). Recent Labs  Lab 06/18/19 1439 06/19/19 0422  WBC 17.3* 15.7*    Liver Function Tests: Recent Labs  Lab 06/18/19 1439 06/19/19 0422  AST 73* 63*  ALT 60* 59*  ALKPHOS 69 68  BILITOT 0.9 1.1  PROT 6.7 6.6  ALBUMIN 2.9* 2.7*   No results for input(s): LIPASE, AMYLASE in the last 168 hours. No results for input(s): AMMONIA in the last  168 hours.  ABG    Component Value Date/Time   PHART 7.466 (H) 06/19/2019 1248   PCO2ART 27.0 (L) 06/19/2019 1248   PO2ART 74.0 (L) 06/19/2019 1248   HCO3 19.6 (L) 06/19/2019 1248   TCO2 20 (L) 06/19/2019 1248   ACIDBASEDEF 3.0 (H) 06/19/2019 1248   O2SAT 96.0 06/19/2019 1248     Coagulation Profile: Recent Labs  Lab 06/18/19 1439  INR 1.1    Cardiac Enzymes: No results for input(s): CKTOTAL, CKMB, CKMBINDEX, TROPONINI in the last 168 hours.  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   Surgical History   History reviewed. No pertinent surgical history.   Social History     Patient lives independently, per his daughter he is a non-smoker. Family History   No known family history of stroke  Allergies No Known Allergies   Home Medications  Prior to Admission medications   Medication Sig Start Date End Date Taking? Authorizing Provider  Cholecalciferol ([employer] VITAMIN D3) 250 MCG (10000 UT) CAPS Take 1 capsule by mouth daily.   Yes [provider]  vitamin C (ASCORBIC ACID) 250 MG tablet Take 250 mg by mouth daily.   Yes [provider]     Critical care time   The patient is critically ill with multiple organ systems failure and requires high complexity decision making for assessment and support, frequent evaluation and titration of therapies, application of advanced monitoring technologies and extensive interpretation of multiple databases.   Critical Care Time devoted to patient care  services described in this note is 31 minutes. This time reflects my personal involvement in patient care. This critical care time does not reflect separately billable procedures or procedure time, teaching time or supervisory time of PA/NP/Med student/Med Resident etc but could involve care discussion time.  Leone [facility] 06/19/2019 2:00 PM  Pager: [phone] After hours pager: [phone]

## 2019-06-19 NOTE — Progress Notes (Signed)
Progress Note  Patient Name: Levi Pruitt Date of Encounter: 06/19/2019  Primary Cardiologist:   No primary care provider on file.   Subjective   The patient with dysarthric.  No acute distress  Inpatient Medications    Scheduled Meds: .  stroke: mapping our early stages of recovery book   Does not apply Once  . aspirin  81 mg Oral Daily  . atorvastatin  80 mg Oral q1800  . ipratropium-albuterol  3 mL Nebulization Q6H   Continuous Infusions: . sodium chloride 50 mL/hr at 06/19/19 1314  . azithromycin    . cefTRIAXone (ROCEPHIN)  IV 1 g (06/19/19 1305)  . heparin 1,400 Units/hr (06/19/19 0640)   PRN Meds: acetaminophen **OR** acetaminophen (TYLENOL) oral liquid 160 mg/5 mL **OR** acetaminophen, ipratropium-albuterol   Vital Signs    Vitals:   06/19/19 1230 06/19/19 1245 06/19/19 1300 06/19/19 1315  BP: 132/70 132/80 139/79 138/80  Pulse: 67 69 73 68  Resp: (!) 26 (!) 27 (!) 27 (!) 28  Temp:      TempSrc:      SpO2: 92% 99% 95% 96%  Weight:      Height:       No intake or output data in the 24 hours ending 06/19/19 1404 Filed Weights   06/19/19 0624  Weight: 97.3 kg    Telemetry    NA  ECG    NA - Personally Reviewed  Physical Exam   GEN: No acute distress.   Neck: No  JVD Cardiac: RRR, no murmurs, rubs, or gallops.  Respiratory:    Decreased breath sounds GI: Soft, nontender, non-distended  MS: No edema; No deformity. Neuro:  right hemiparesis   Labs    Chemistry Recent Labs  Lab 06/18/19 1439 06/18/19 1447 06/18/19 1619 06/19/19 0422 06/19/19 1248  NA 138 137 137 139 141  K 3.7 3.6 4.1 4.0 4.0  CL 105 104  --  107  --   CO2 20*  --   --  20*  --   GLUCOSE 102* 95  --  96  --   BUN 43* 41*  --  38*  --   CREATININE 1.66* 1.50*  --  1.38*  --   CALCIUM 8.5*  --   --  8.3*  --   PROT 6.7  --   --  6.6  --   ALBUMIN 2.9*  --   --  2.7*  --   AST 73*  --   --  63*  --   ALT 60*  --   --  59*  --   ALKPHOS 69  --   --  68  --     BILITOT 0.9  --   --  1.1  --   GFRNONAA 40*  --   --  50*  --   GFRAA 47*  --   --  58*  --   ANIONGAP 13  --   --  12  --      Hematology Recent Labs  Lab 06/18/19 1439 06/18/19 1619 06/19/19 0422 06/19/19 1248  WBC 17.3*  --  15.7*  --   RBC 4.39  --  4.33  --   HGB 13.9 15.6 13.6 13.3  HCT 42.7 46.0 43.7 39.0  MCV 97.3  --  100.9*  --   MCH 31.7  --  31.4  --   MCHC 32.6  --  31.1  --   RDW 12.9  --  13.2  --  PLT 243  --  239  --     Cardiac EnzymesNo results for input(s): TROPONINI in the last 168 hours. No results for input(s): TROPIPOC in the last 168 hours.   BNPNo results for input(s): BNP, PROBNP in the last 168 hours.   DDimer No results for input(s): DDIMER in the last 168 hours.   Radiology    CT Code Stroke CTA Head W/WO contrast  Result Date: 06/18/2019 CLINICAL DATA:  74 year old male code stroke presentation with right side weakness. EXAM: CT ANGIOGRAPHY HEAD AND NECK CT PERFUSION BRAIN TECHNIQUE: Multidetector CT imaging of the head and neck was performed using the standard protocol during bolus administration of intravenous contrast. Multiplanar CT image reconstructions and MIPs were obtained to evaluate the vascular anatomy. Carotid stenosis measurements (when applicable) are obtained utilizing NASCET criteria, using the distal internal carotid diameter as the denominator. Multiphase CT imaging of the brain was performed following IV bolus contrast injection. Subsequent parametric perfusion maps were calculated using RAPID software. CONTRAST:  100 milliliters Omnipaque 350 COMPARISON:  Plain head CT 1448 hours today. FINDINGS: CT Brain Perfusion Findings: ASPECTS: 10, although a large left PCA territory infarct was suspected by plain CT. CBF (<30%) Volume: 96mL, corresponding to a portion of the left PCA territory in the occipital lobe Perfusion (Tmax>6.0s) volume: 71mL, although some of this is likely artifactual involving the right temporal lobe portion of  which is chronically abnormal. Mismatch Volume: 46mL Infarction Location:Left PCA CTA NECK Skeleton: Carious dentition. No acute osseous abnormality identified. Upper chest: Small bilateral layering pleural effusions. Septal thickening and mild additional peribronchial opacity near the hila in the upper lobes. No superior mediastinal lymphadenopathy. Other neck: Retropharyngeal course of both carotid arteries. Small round 8 millimeter intermediate density soft tissue nodule in the superficial lobe of the left parotid gland. No cervical lymphadenopathy or other neck mass. Aortic arch: Mild Calcified aortic atherosclerosis. 3 vessel arch configuration. Right carotid system: Mild motion artifact at the right CCA origin. Circumferential soft plaque in the right CCA proximal to the bifurcation as it has a retropharyngeal course on series 3, image 125, but less than 50 % stenosis with respect to the distal vessel. Superimposed calcified plaque at the right carotid bifurcation, and bulky additional soft plaque in the right ICA bulb resulting in a short segment high-grade stenosis approaching a radiographic string sign on series 3, image 94. The right ICA remains patent to the skull base. Left carotid system: Normal left CCA origin. Circumferential soft plaque in the left CCA similar to that on the right without stenosis. Soft and calcified plaque at the left ICA origin and bulb resulting in up to 60 % stenosis with respect to the distal vessel. Left ICA remains patent to the skull base. Vertebral arteries: Mild if any proximal right subclavian artery plaque with no stenosis there or at the right vertebral artery origin. The right vertebral appears dominant and is patent to the skull base without stenosis. Soft and calcified plaque in the proximal left subclavian artery not resulting in significant subclavian artery stenosis, but there is moderate to severe stenosis at the left vertebral artery origin and V1 segment due to  mostly soft plaque (series 6, image 154). The distal left V1 segment is also diminutive and stenotic (image 151). The left V2 segment is intermittently diminutive and stenotic, but the vessel remains patent to the skull base. CTA HEAD Posterior circulation: The right vertebral artery is patent to the vertebrobasilar junction without stenosis. The right PICA origin  remains patent. The left V4 segment is occluded distal to the patent left PICA but proximal to the vertebrobasilar junction on series 5, image 132. The basilar artery remains patent to the SCA origins. The left PCA is occluded just beyond its origin (series 9, image 26). No reconstitution. And the right PCA is functionally occluded at its origin with only minimal irregular right PCA branch enhancement. Anterior circulation: Both ICA siphons are patent. On the left there is moderate to severe circumferential calcified plaque in the cavernous segment resulting in moderate to severe stenosis proximal to the anterior genu (series 5, image 102). Moderate additional left supraclinoid segment stenosis. On the right there is similar advanced siphon atherosclerosis with moderate right cavernous and moderate to severe right supraclinoid segment stenosis. Patent right ICA terminus. There is a patent small left posterior communicating artery. Patent carotid termini. Patent MCA and ACA origins. Left A1 is mildly dominant. Anterior communicating artery is patent with a median artery of the corpus callosum. Bilateral ACA branches are patent with mild irregularity. Left MCA M1 segment and left MCA bifurcation are patent without stenosis. Left MCA M3 branches are mildly to moderately irregular. Right MCA M1 segment and bifurcation are patent without stenosis. Right MCA M3 branches are also mildly to moderately irregular. Venous sinuses: Early contrast timing, grossly patent assuming dominant right side transverse and sigmoid sinuses. Anatomic variants: Median artery of the  corpus callosum. Review of the MIP images confirms the above findings IMPRESSION: 1. Positive for acute appearing occlusions of the distal Left Vertebral Artery and Left PCA: - distal left V4 occlusion, with upstream multifocal moderate and severe left vertebral stenosis in the neck. - left PCA P1 occlusion just beyond the origin. 2. CTP detects only some of the left PCA territory core infarct which was visible by plain CT. 3. 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 The right vertebral artery and basilar arteries are patent without stenosis. 4. 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 5. Mild to moderate irregularity of the bilateral MCA branches. 6. Small bilateral pleural effusions with increased pulmonary septal thickening suggestive of pulmonary edema. 7. 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 Recommend follow-up with ENT. Preliminary results of the arterial findings were text paged to Dr. Lorraine Lax via Shea Evans at 1503 hours, and again at 1518 hours. Electronically Signed   By: Genevie Ann M.D.   On: 06/18/2019 15:26   CT Code Stroke CTA Neck W/WO contrast  Result Date: 06/18/2019 CLINICAL DATA:  74 year old male code stroke presentation with right side weakness. EXAM: CT ANGIOGRAPHY HEAD AND NECK CT PERFUSION BRAIN TECHNIQUE: Multidetector CT imaging of the head and neck was performed using the standard protocol during bolus administration of intravenous contrast. Multiplanar CT image reconstructions and MIPs were obtained to evaluate the vascular anatomy. Carotid stenosis measurements (when applicable) are obtained utilizing NASCET criteria, using the distal internal carotid diameter as the denominator. Multiphase CT imaging of the brain was performed following IV bolus contrast injection. Subsequent parametric perfusion  maps were calculated using RAPID software. CONTRAST:  100 milliliters Omnipaque 350 COMPARISON:  Plain head CT 1448 hours today. FINDINGS: CT Brain Perfusion Findings: ASPECTS: 10, although a large left PCA territory infarct was suspected by plain CT. CBF (<30%) Volume: 38mL, corresponding to a portion of the left PCA territory in the occipital lobe Perfusion (  Tmax>6.0s) volume: 63mL, although some of this is likely artifactual involving the right temporal lobe portion of which is chronically abnormal. Mismatch Volume: 59mL Infarction Location:Left PCA CTA NECK Skeleton: Carious dentition. No acute osseous abnormality identified. Upper chest: Small bilateral layering pleural effusions. Septal thickening and mild additional peribronchial opacity near the hila in the upper lobes. No superior mediastinal lymphadenopathy. Other neck: Retropharyngeal course of both carotid arteries. Small round 8 millimeter intermediate density soft tissue nodule in the superficial lobe of the left parotid gland. No cervical lymphadenopathy or other neck mass. Aortic arch: Mild Calcified aortic atherosclerosis. 3 vessel arch configuration. Right carotid system: Mild motion artifact at the right CCA origin. Circumferential soft plaque in the right CCA proximal to the bifurcation as it has a retropharyngeal course on series 3, image 125, but less than 50 % stenosis with respect to the distal vessel. Superimposed calcified plaque at the right carotid bifurcation, and bulky additional soft plaque in the right ICA bulb resulting in a short segment high-grade stenosis approaching a radiographic string sign on series 3, image 94. The right ICA remains patent to the skull base. Left carotid system: Normal left CCA origin. Circumferential soft plaque in the left CCA similar to that on the right without stenosis. Soft and calcified plaque at the left ICA origin and bulb resulting in up to 60 % stenosis with respect to the distal vessel. Left ICA  remains patent to the skull base. Vertebral arteries: Mild if any proximal right subclavian artery plaque with no stenosis there or at the right vertebral artery origin. The right vertebral appears dominant and is patent to the skull base without stenosis. Soft and calcified plaque in the proximal left subclavian artery not resulting in significant subclavian artery stenosis, but there is moderate to severe stenosis at the left vertebral artery origin and V1 segment due to mostly soft plaque (series 6, image 154). The distal left V1 segment is also diminutive and stenotic (image 151). The left V2 segment is intermittently diminutive and stenotic, but the vessel remains patent to the skull base. CTA HEAD Posterior circulation: The right vertebral artery is patent to the vertebrobasilar junction without stenosis. The right PICA origin remains patent. The left V4 segment is occluded distal to the patent left PICA but proximal to the vertebrobasilar junction on series 5, image 132. The basilar artery remains patent to the SCA origins. The left PCA is occluded just beyond its origin (series 9, image 26). No reconstitution. And the right PCA is functionally occluded at its origin with only minimal irregular right PCA branch enhancement. Anterior circulation: Both ICA siphons are patent. On the left there is moderate to severe circumferential calcified plaque in the cavernous segment resulting in moderate to severe stenosis proximal to the anterior genu (series 5, image 102). Moderate additional left supraclinoid segment stenosis. On the right there is similar advanced siphon atherosclerosis with moderate right cavernous and moderate to severe right supraclinoid segment stenosis. Patent right ICA terminus. There is a patent small left posterior communicating artery. Patent carotid termini. Patent MCA and ACA origins. Left A1 is mildly dominant. Anterior communicating artery is patent with a median artery of the corpus  callosum. Bilateral ACA branches are patent with mild irregularity. Left MCA M1 segment and left MCA bifurcation are patent without stenosis. Left MCA M3 branches are mildly to moderately irregular. Right MCA M1 segment and bifurcation are patent without stenosis. Right MCA M3 branches are also mildly to moderately irregular. Venous sinuses: Early contrast  timing, grossly patent assuming dominant right side transverse and sigmoid sinuses. Anatomic variants: Median artery of the corpus callosum. Review of the MIP images confirms the above findings IMPRESSION: 1. Positive for acute appearing occlusions of the distal Left Vertebral Artery and Left PCA: - distal left V4 occlusion, with upstream multifocal moderate and severe left vertebral stenosis in the neck. - left PCA P1 occlusion just beyond the origin. 2. CTP detects only some of the left PCA territory core infarct which was visible by plain CT. 3. 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 The right vertebral artery and basilar arteries are patent without stenosis. 4. 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 5. Mild to moderate irregularity of the bilateral MCA branches. 6. Small bilateral pleural effusions with increased pulmonary septal thickening suggestive of pulmonary edema. 7. 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 Recommend follow-up with ENT. Preliminary results of the arterial findings were text paged to Dr. Lorraine Lax via Shea Evans at 1503 hours, and again at 1518 hours. Electronically Signed   By: Genevie Ann M.D.   On: 06/18/2019 15:26   MR BRAIN WO CONTRAST  Result Date: 06/19/2019 CLINICAL DATA:  Stroke follow-up. EXAM: MRI HEAD WITHOUT CONTRAST TECHNIQUE: Multiplanar, multiecho pulse sequences of the brain and surrounding structures were obtained without intravenous  contrast. COMPARISON:  Head CT, CTA, and CTP 06/18/2019 FINDINGS: Brain: As seen on CT, there is a large acute left PCA infarct involving the medial aspects of the left temporal and occipital lobes. The left thalamus, posterior limb of the left internal capsule, left splenium of the corpus callosum, left mamillary body, and left cerebral peduncle are also involved. There is also a 7 mm acute cortical infarct in the posterior left frontal lobe which involves the anterior aspect of the precentral gyrus. There is no hemorrhage associated with these acute infarcts. Chronic infarcts are again noted in the right temporal and right occipital lobes. There is a single punctate focus of susceptibility artifact in the right cerebellum suggesting a chronic microhemorrhage. Small chronic infarcts are noted in the cerebellum bilaterally. Scattered small foci of T2 hyperintensity in the cerebral white matter bilaterally are nonspecific but compatible with minimal chronic small vessel ischemic disease. There is no mass, midline shift, or extra-axial fluid collection. There is mild global cerebral atrophy. Vascular: Abnormal appearance of the distal left vertebral artery corresponding to occlusion on CT. Skull and upper cervical spine: No suspicious marrow lesion. Sinuses/Orbits: Unremarkable orbits. Paranasal sinuses and mastoid air cells are clear. Other: None. IMPRESSION: 1. Large acute left PCA infarct. 2. Small acute left frontal lobe infarct. 3. 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 Electronically Signed   By: Logan Bores M.D.   On: 06/19/2019 11:27   CT Code Stroke Cerebral Perfusion with contrast  Result Date: 06/18/2019 CLINICAL DATA:  74 year old male code stroke presentation with right side weakness. EXAM: CT ANGIOGRAPHY HEAD AND NECK CT PERFUSION BRAIN TECHNIQUE: Multidetector CT imaging of the head and neck was performed using the standard protocol during bolus administration of intravenous  contrast. Multiplanar CT image reconstructions and MIPs were obtained to evaluate the vascular anatomy. Carotid stenosis measurements (when applicable) are obtained utilizing NASCET criteria, using the distal internal carotid diameter as the denominator. Multiphase CT imaging of the brain was performed following IV bolus contrast injection. Subsequent parametric perfusion maps  were calculated using RAPID software. CONTRAST:  100 milliliters Omnipaque 350 COMPARISON:  Plain head CT 1448 hours today. FINDINGS: CT Brain Perfusion Findings: ASPECTS: 10, although a large left PCA territory infarct was suspected by plain CT. CBF (<30%) Volume: 21mL, corresponding to a portion of the left PCA territory in the occipital lobe Perfusion (Tmax>6.0s) volume: 93mL, although some of this is likely artifactual involving the right temporal lobe portion of which is chronically abnormal. Mismatch Volume: 1mL Infarction Location:Left PCA CTA NECK Skeleton: Carious dentition. No acute osseous abnormality identified. Upper chest: Small bilateral layering pleural effusions. Septal thickening and mild additional peribronchial opacity near the hila in the upper lobes. No superior mediastinal lymphadenopathy. Other neck: Retropharyngeal course of both carotid arteries. Small round 8 millimeter intermediate density soft tissue nodule in the superficial lobe of the left parotid gland. No cervical lymphadenopathy or other neck mass. Aortic arch: Mild Calcified aortic atherosclerosis. 3 vessel arch configuration. Right carotid system: Mild motion artifact at the right CCA origin. Circumferential soft plaque in the right CCA proximal to the bifurcation as it has a retropharyngeal course on series 3, image 125, but less than 50 % stenosis with respect to the distal vessel. Superimposed calcified plaque at the right carotid bifurcation, and bulky additional soft plaque in the right ICA bulb resulting in a short segment high-grade stenosis  approaching a radiographic string sign on series 3, image 94. The right ICA remains patent to the skull base. Left carotid system: Normal left CCA origin. Circumferential soft plaque in the left CCA similar to that on the right without stenosis. Soft and calcified plaque at the left ICA origin and bulb resulting in up to 60 % stenosis with respect to the distal vessel. Left ICA remains patent to the skull base. Vertebral arteries: Mild if any proximal right subclavian artery plaque with no stenosis there or at the right vertebral artery origin. The right vertebral appears dominant and is patent to the skull base without stenosis. Soft and calcified plaque in the proximal left subclavian artery not resulting in significant subclavian artery stenosis, but there is moderate to severe stenosis at the left vertebral artery origin and V1 segment due to mostly soft plaque (series 6, image 154). The distal left V1 segment is also diminutive and stenotic (image 151). The left V2 segment is intermittently diminutive and stenotic, but the vessel remains patent to the skull base. CTA HEAD Posterior circulation: The right vertebral artery is patent to the vertebrobasilar junction without stenosis. The right PICA origin remains patent. The left V4 segment is occluded distal to the patent left PICA but proximal to the vertebrobasilar junction on series 5, image 132. The basilar artery remains patent to the SCA origins. The left PCA is occluded just beyond its origin (series 9, image 26). No reconstitution. And the right PCA is functionally occluded at its origin with only minimal irregular right PCA branch enhancement. Anterior circulation: Both ICA siphons are patent. On the left there is moderate to severe circumferential calcified plaque in the cavernous segment resulting in moderate to severe stenosis proximal to the anterior genu (series 5, image 102). Moderate additional left supraclinoid segment stenosis. On the right there  is similar advanced siphon atherosclerosis with moderate right cavernous and moderate to severe right supraclinoid segment stenosis. Patent right ICA terminus. There is a patent small left posterior communicating artery. Patent carotid termini. Patent MCA and ACA origins. Left A1 is mildly dominant. Anterior communicating artery is patent with a median artery of  the corpus callosum. Bilateral ACA branches are patent with mild irregularity. Left MCA M1 segment and left MCA bifurcation are patent without stenosis. Left MCA M3 branches are mildly to moderately irregular. Right MCA M1 segment and bifurcation are patent without stenosis. Right MCA M3 branches are also mildly to moderately irregular. Venous sinuses: Early contrast timing, grossly patent assuming dominant right side transverse and sigmoid sinuses. Anatomic variants: Median artery of the corpus callosum. Review of the MIP images confirms the above findings IMPRESSION: 1. Positive for acute appearing occlusions of the distal Left Vertebral Artery and Left PCA: - distal left V4 occlusion, with upstream multifocal moderate and severe left vertebral stenosis in the neck. - left PCA P1 occlusion just beyond the origin. 2. CTP detects only some of the left PCA territory core infarct which was visible by plain CT. 3. 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 The right vertebral artery and basilar arteries are patent without stenosis. 4. 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 5. Mild to moderate irregularity of the bilateral MCA branches. 6. Small bilateral pleural effusions with increased pulmonary septal thickening suggestive of pulmonary edema. 7. 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 Recommend follow-up with ENT. Preliminary results of the arterial findings were text  paged to Dr. Lorraine Lax via Shea Evans at 1503 hours, and again at 1518 hours. Electronically Signed   By: Genevie Ann M.D.   On: 06/18/2019 15:26   DG CHEST PORT 1 VIEW  Result Date: 06/19/2019 CLINICAL DATA:  Acute left PCA infarct EXAM: PORTABLE CHEST 1 VIEW COMPARISON:  06/18/2019 FINDINGS: Stable cardiomegaly with central vascular congestion. Left basilar airspace opacity/consolidation obscures the left hemidiaphragm compatible with atelectasis and or pneumonia. Right lung remains clear. No large effusion or pneumothorax. Trachea midline. Degenerative changes of the spine. IMPRESSION: Cardiomegaly with vascular congestion Left basilar atelectasis/pneumonia. Electronically Signed   By: Jerilynn Mages.  Shick M.D.   On: 06/19/2019 13:48   XR Chest Single View  Result Date: 06/18/2019 CLINICAL DATA:  Altered behavior EXAM: PORTABLE CHEST 1 VIEW COMPARISON:  None. FINDINGS: Mild cardiomegaly with central vascular congestion. Probable small pleural effusions. Patchy airspace disease left infrahilar lung. No pneumothorax. IMPRESSION: 1. Mild cardiomegaly with central vascular congestion and small pleural effusions. 2. Patchy airspace disease in the left infrahilar lung, atelectasis versus pneumonia. Electronically Signed   By: Donavan Foil M.D.   On: 06/18/2019 17:40   CT HEAD CODE STROKE WO CONTRAST  Result Date: 06/18/2019 CLINICAL DATA:  Code stroke. 74 year old male with right side weakness and facial droop. EXAM: CT HEAD WITHOUT CONTRAST TECHNIQUE: Contiguous axial images were obtained from the base of the skull through the vertex without intravenous contrast. COMPARISON:  None. FINDINGS: Brain: Large area of hypodense cytotoxic edema demonstrated from the mesial left temporal lobe through the left occipital pole in keeping with a large left PCA territory infarct. Mild regional mass effect. No associated hemorrhage. Comparatively mild hypodensity in the left thalamus. Superimposed chronic appearing encephalomalacia in the right  temporal and occipital lobes. No MCA territory acute cortically based infarct changes identified. No acute intracranial hemorrhage identified. No ventriculomegaly. No midline shift and patent basilar cisterns. Vascular: Calcified atherosclerosis at the skull base. No suspicious intracranial vascular hyperdensity. Skull: No acute osseous abnormality identified. Sinuses/Orbits: Well pneumatized paranasal sinuses. Tympanic cavities and mastoids are clear. Other: Visualized orbits and  scalp soft tissues are within normal limits. ASPECTS [facility] Stroke Program Early CT Score) Total score (0-10 with 10 being normal): 10, but not applicable in this case given evidence of large acute left PCA infarct. IMPRESSION: 1. Large acute Left PCA territory infarct. No associated hemorrhage. Minor mass effect. 2. No acute anterior circulation infarct is evident (ASPECTS 10). 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 3. These results were communicated to Dr. Lorraine Lax at 2:59 pm on 06/18/2019 by text page via the [facility] messaging system. Electronically Signed   By: Genevie Ann M.D.   On: 06/18/2019 15:02    Cardiac Studies   ECHO:   1. Left ventricular ejection fraction, by visual estimation, is 25 to 30%. The left ventricle has severely decreased function. There is no left ventricular hypertrophy.  2. Elevated left atrial pressure.  3. Left ventricular diastolic parameters are consistent with Grade III diastolic dysfunction (restrictive).  4. Mildly dilated left ventricular internal cavity size.  5. The left ventricle demonstrates regional wall motion abnormalities.  6. Global right ventricle has normal systolic function.The right ventricular size is normal.  7. Left atrial size was normal.  8. Right atrial size was normal.  9. The mitral valve is normal in structure. Mild mitral valve regurgitation. No evidence of mitral stenosis. 10. The tricuspid valve is normal in structure. 11. The aortic valve is tricuspid.  Aortic valve regurgitation is not visualized. Mild aortic valve sclerosis without stenosis. 12. The pulmonic valve was not well visualized. Pulmonic valve regurgitation is trivial. 13. The inferior vena cava is dilated in size with >50% respiratory variability, suggesting right atrial pressure of 8 mmHg. 14. Akinesis of the inferior, inferolateral, apical and distal septal walls; overall severe LV dysfunction; restrictive filling; mild LVE; mild MR.  Patient Profile     74 y.o. male presented with evidence of acute CVA and noted to have acute inferior ST elevation as well.    Assessment & Plan    ACUTE CVA:     Large acute PCA infarct.    ACUTE INFERIOR STEMI:   He was started on IV heparin despite the risk.  Also on ASA.  Suggest Plavix when OK with neurology.   Continue heparin today.  Reduced EF as above.  Holding off on titrating meds to avoid hypotension.   Agree with order for IV Lasix.    ABNORMAL CXR:  On antibiotics.    For questions or updates, please contact [place] Please consult [email or web address] for contact info under Cardiology/STEMI.   Signed, Minus Breeding, MD  06/19/2019, 2:04 PM

## 2019-06-19 NOTE — ED Notes (Signed)
Attempt to draw blood no success

## 2019-06-19 NOTE — Progress Notes (Signed)
STROKE TEAM PROGRESS NOTE   INTERVAL HISTORY His daughter is at the bedside.  Daughter said that patient is very stubborn, does not seeing doctors and lives alone.  2 years ago, daughter recalled one time patient had a sudden onset altered mental status, lethargic in the restaurant but resolved second day.  She does not know whether that was stroke at that time.  Patient lying in bed, intermittent wheezing, severe dysarthria, speech not able to be understood.  Right facial droop, right hemianopia, right hemiplegia, on heparin drip for possible MI.  2D echo pending.  Concerning for aspirin pneumonia, put on antibiotics.  OBJECTIVE Vitals:   06/19/19 0730 06/19/19 0745 06/19/19 0845 06/19/19 0900  BP: 119/74 129/68 117/72 109/74  Pulse: 71 68 67 66  Resp: 19  19 14   SpO2: 93% 99% 95% 95%  Weight:      Height:        CBC:  Recent Labs  Lab 06/18/19 1439 06/18/19 1619 06/19/19 0422  WBC 17.3*  --  15.7*  NEUTROABS 13.8*  --   --   HGB 13.9 15.6 13.6  HCT 42.7 46.0 43.7  MCV 97.3  --  100.9*  PLT 243  --  [id number]    Basic Metabolic Panel:  Recent Labs  Lab 06/18/19 1439 06/18/19 1447 06/18/19 1619 06/19/19 0422  NA 138 137 137 139  K 3.7 3.6 4.1 4.0  CL 105 104  --  107  CO2 20*  --   --  20*  GLUCOSE 102* 95  --  96  BUN 43* 41*  --  38*  CREATININE 1.66* 1.50*  --  1.38*  CALCIUM 8.5*  --   --  8.3*    Lipid Panel:     Component Value Date/Time   CHOL 152 06/19/2019 0422   TRIG 107 06/19/2019 0422   HDL 32 (L) 06/19/2019 0422   CHOLHDL 4.8 06/19/2019 0422   VLDL 21 06/19/2019 0422   LDLCALC 99 06/19/2019 0422   HgbA1c:  Lab Results  Component Value Date   HGBA1C 5.3 06/19/2019   Urine Drug Screen: No results found for: LABOPIA, COCAINSCRNUR, LABBENZ, AMPHETMU, THCU, LABBARB  Alcohol Level No results found for: [facility]  IMAGING  CT Code Stroke CTA Head W/WO contrast CT Code Stroke CTA Neck W/WO contrast CT Code Stroke Cerebral Perfusion with  contrast 06/18/2019 IMPRESSION:  1. Positive for acute appearing occlusions of the distal Left Vertebral Artery and Left PCA: - distal left V4 occlusion, with upstream multifocal moderate and severe left vertebral stenosis in the neck. - left PCA P1 occlusion just beyond the origin.  2. CTP detects only some of the left PCA territory core infarct which was visible by plain CT.  3. 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 The right vertebral artery and basilar arteries are patent without stenosis.  4. 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  5. Mild to moderate irregularity of the bilateral MCA branches.  6. Small bilateral pleural effusions with increased pulmonary septal thickening suggestive of pulmonary edema.  7. 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 Recommend follow-up with ENT.   CT HEAD CODE STROKE WO CONTRAST 06/18/2019 IMPRESSION:  1. Large acute Left PCA territory infarct. No associated hemorrhage. Minor mass effect.  2. No acute anterior circulation infarct is evident (ASPECTS 10). Chronic  right temporal lobe and occipital lobe infarcts.   MRI Brain WO Contrast 1. Large acute left PCA infarct. 2. Small acute left frontal lobe infarct. 3. 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  XR Chest Single View 06/18/2019 IMPRESSION:  1. Mild cardiomegaly with central vascular congestion and small pleural effusions.  2. Patchy airspace disease in the left infrahilar lung, atelectasis versus pneumonia.   Transthoracic Echocardiogram  Pending   PHYSICAL EXAM  Temp:  [97.6 F (36.4 C)] 97.6 F (36.4 C) (01/09 1212) Pulse Rate:  [65-83] 68 (01/09 1315) Resp:  [13-29] 28 (01/09 1315) BP: (93-150)/(65-106) 138/80 (01/09 1315) SpO2:  [90 %-99 %] 96 % (01/09 1315) Weight:  [97.3  kg] 97.3 kg (01/09 0624)  General - Well nourished, well developed, mild respiratory distress with wheezing.  Ophthalmologic - fundi not visualized due to noncooperation.  Cardiovascular - Regular rhythm and rate.  Neuro - lethargic and drowsy, however, open eyes with voice.  Able to follow simple commands but psychomotor slowing, severe dysarthria, intelligible words.  PERRL, EOMI, right hemianopia, not blinking to visual threat on the right, but able to blink to visual threat on the left.  Right facial droop, tongue midline.  Right upper and lower extremity flaccid, only slight withdrawal on the right lower extremity.  Left upper extremity at least 4/5, follow commands.  Left lower extremity 2/5 proximal and 3/5 distally.  DTR 1+, no Babinski.  Sensation and coordination not corporative.  Gait not tested.   ASSESSMENT/PLAN [name] is a 74 y.o. male with no known significant past medical history presenting as a code stroke with c/o slurred speech, right arm flaccid. Cardiology consult for STEMI.  He did not receive IV t-PA due to late presentation (>4.5 hours from time of onset).  Stroke: Large left PCA territory acute infarct - embolic patter - unknown source, could be related to STEMI or large vessel atherosclerosis.  Resultant right hemianopia, right hemiplegia, right facial droop, severe dysarthria  Code Stroke CT Head - Large acute Left PCA territory infarct. No associated hemorrhage. Minor mass effect. No acute anterior circulation infarct is evident (ASPECTS 10). 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   MRI head - large acute left PCA infarct, small acute left frontal lobe infarct, chronic right temporal and right occipital lobe infarcts  CTA H&N - acute occlusions of the distal Left V4 and Left P1. Chronic occlusion of the right PCA. 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   2D Echo - pending  Levi Pruitt 2 - negative  LDL - 99  HgbA1c 5.3  UDS - pending  VTE prophylaxis - Heparin IV  No antithrombotic prior to admission, now on aspirin 81 mg daily and heparin IV.   Patient counseled to be compliant with his antithrombotic medications  Ongoing aggressive stroke risk factor management  Therapy recommendations:  pending  Disposition:  Pending  STEMI  Troponin -12,799-> 12,437-> 10,883-> 8791  Cardiology on board  On aspirin 81 and heparin IV  EKG concerning for inferior leads MI  2D echo pending  Acute pulmonary edema  CXR - Small bilateral pleural effusions with increased pulmonary septal thickening suggestive of pulmonary edema.   Intermittent wheezing  Decrease IV fluid to 50 cc/h  Cardiology on board  Aspiration pneumonia versus pneumonitis  Severe dysarthria and difficulty handling oral secretions  Intermittent wheezing  Leukocytosis -WBC 17.3 ->15.7   On Rocephin and azithromycin  Treatment per primary team  BP measurement  Home BP meds: none   Current BP meds: none   Stable . Permissive hypertension (OK if <180/105) but gradually normalize in 3-5 days  . 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 Recommend follow-up with ENT.   Small bilateral pleural effusions with increased pulmonary septal thickening suggestive of pulmonary edema.   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 This patient's care requiresreview of multiple databases, neurological assessment, discussion with family, other specialists and medical decision making of high complexity. I had long discussion with daughter at bedside, updated pt current condition, treatment plan and potential prognosis, and answered all the questions.  Daughter expressed understanding and appreciation.  I also discussed with Dr. Marthenia Rolling.  Rosalin Hawking, MD PhD Stroke Neurology 06/19/2019 2:27 PM    To contact Stroke Continuity provider, please refer to [email or web address] After hours, contact General Neurology

## 2019-06-19 NOTE — Progress Notes (Signed)
ANTICOAGULATION CONSULT NOTE - Follow-Up Consult  Pharmacy Consult for heparin Indication: chest pain/ACS  Patient Measurements: Weight 214.5 lbs Height 69 inches Heparin Dosing Weight: 91 kg  Vital Signs: BP: 121/77 (01/09 0545) Pulse Rate: 71 (01/09 0545)  Labs: Recent Labs    06/18/19 1439 06/18/19 1439 06/18/19 1447 06/18/19 1619 06/18/19 1828 06/18/19 2212 06/19/19 0000 06/19/19 0422  HGB 13.9  --  14.6 15.6  --   --   --  13.6  HCT 42.7  --  43.0 46.0  --   --   --  43.7  PLT 243  --   --   --   --   --   --  239  APTT 26  --   --   --   --   --   --   --   LABPROT 13.9  --   --   --   --   --   --   --   INR 1.1  --   --   --   --   --   --   --   HEPARINUNFRC  --   --   --   --   --   --   --  0.10*  CREATININE 1.66*  --  1.50*  --   --   --   --  1.38*  TROPONINIHS  --    < >  --   --  12,437* 10,883* 8,791*  --    < > = values in this interval not displayed.     Medical History: Past Medical History:  Diagnosis Date  . 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 He was originally called as a code stroke - he was found to have a L PCA infarct. tPA was not given secondary to last seen normal was outside of the time window.  Later he was found to have an abnormal ECG which was diagnosed as a STEMI. He is unable to go for cardiac cath due to acute stroke. We will begin heparin conservatively in the meantime. SCr 1.5, CBC stable.   Heparin level 0.1 on 1200 units/hr.  No IV issues per RN.  Goal of Therapy:  Heparin level 0.3-0.5 units/mL Monitor platelets by anticoagulation protocol: Yes   Plan:  -Increase heparin to 1400 units/hr -Next heparin level in 6 hours. -Daily heparin level and CBC while on heparin -No boluses, aim for low goal for the first couple days  [employer], Pharm.D., BCPS Clinical Pharmacist  **Pharmacist phone directory can be found on [email or web address]  listed under [place].  06/19/2019 6:28 AM

## 2019-06-19 NOTE — H&P (Signed)
PROGRESS NOTE    Levi Pruitt  [id number] DOB: September 28, 1945 DOA: 06/18/2019 PCP: Patient, No Pcp Per  Outpatient Specialists:   Brief Narrative:  Patient is a 74 year old Caucasian male, obese, with no other documented past medical history. Patient is not able to provide and history.  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  Apparently, patient was found down at home.  CXR revealed "Mild cardiomegaly with central vascular congestion and small pleural effusions.  2. Patchy airspace disease in the left infrahilar lung, atelectasis versus pneumonia".  Troponin was greater than 12,000 on presentation, but on the downward trend.  Cardiology if following the patient, and recommended heparin drip.  Patient remains lethargic.  Nursing staff seemed worried about possible worsening neuro symptoms.  Discussed with Neuro team and critical team (to assess if patient is ICU appropriate).  Also discussed code status with patient's daughter, but patient's girlfriend is patient's power of attorney.  06/19/2019:  Patient seen alongside patient's Nurse and daughter.  No history from the patient.  Imaging studies reviewed.  Patient is currently on Aspirin and Heparin drip.  Patient actually moves Left upper extremity quite well and left lower extremity to some extent.  Work up for and management of pneumonia is in progress.  AKI is improving.  Wheezing noted.   Assessment and plan: Acute CVA: -MRI brain noted -CT angio head and neck noted -Patient is on aspirin, as well as, heparin drip for elevated troponin -Neurology is directing -Place CorPak tube -Tube feeding with free water  Possible pneumonia: -Patient is at risk for aspiration pneumonia -Start IV ceftriaxone and azithromycin -Sputum culture -Check  procalcitonin  Acute on chronic combined systolic and diastolic CHF: -Echo just reported. -Echo revealed EF of 25 to 30% and grade 3 diastolic dysfunction (restrictive). -DC IV fluid -IV Lasix 40 Mg x1 dose.  Obesity: BMI of 31.68 kg/m reported Further management on outpatient basis  Acute kidney injury: -Improving. -Continue to monitor closely. -UA, urine sodium, urine protein and creatinine. -Low threshold for further work-up, as well as, renal ultrasound  Hyperlipidemia: LDL is 99. Goal LDL should be less than 70. Statin (Lipitor 80 mg p.o. once daily)  Possible NSTEMI: -Initial high-sensitivity troponin was greater than 12,000, but has been on the downward trend. -Echo revealed cardiomyopathy, with EF of 25% -Other possibilities include elevated troponin from neurological problems. -Patient is currently on heparin drip. -Cardiology team is managing.  Input is highly appreciated.  Patient is also on aspirin.  Guarded prognosis  DVT prophylaxis: Heparin drip Code Status: Full code Family Communication: Daughter Disposition Plan: This will depend on hospital course.   Consultants:   Neurology  Cardiology  We will have a low threshold to consult palliative care team.  Procedures:   Echocardiogram.    Antimicrobials:   None   Subjective: Patient is unable to provide any history.  Patient is lethargic.  Objective: Vitals:   06/19/19 1030 06/19/19 1130 06/19/19 1145 06/19/19 1212  BP: (!) 142/76 133/68 122/75   Pulse: 72 71 74   Resp: (!) 27 17 (!) 26   Temp:    97.6 F (36.4 C)  TempSrc:    Axillary  SpO2: 97% 94% 97%   Weight:      Height:       No intake or output data in the 24 hours  ending 06/19/19 1229 Filed Weights   06/19/19 0624  Weight: 97.3 kg    Examination:  General exam: Lethargic.  Wheezing.   Respiratory system: Decreased air entry globally.   Cardiovascular system: S1 & S2 heard Gastrointestinal system: Abdomen is obese,  soft and nontender.  Organs are difficult to assess.   Central nervous system: Lethargic.  Right-sided hemiplegia.  Data Reviewed: I have personally reviewed following labs and imaging studies  CBC: Recent Labs  Lab 06/18/19 1439 06/18/19 1447 06/18/19 1619 06/19/19 0422  WBC 17.3*  --   --  15.7*  NEUTROABS 13.8*  --   --   --   HGB 13.9 14.6 15.6 13.6  HCT 42.7 43.0 46.0 43.7  MCV 97.3  --   --  100.9*  PLT 243  --   --  [id number]   Basic Metabolic Panel: Recent Labs  Lab 06/18/19 1439 06/18/19 1447 06/18/19 1619 06/19/19 0422  NA 138 137 137 139  K 3.7 3.6 4.1 4.0  CL 105 104  --  107  CO2 20*  --   --  20*  GLUCOSE 102* 95  --  96  BUN 43* 41*  --  38*  CREATININE 1.66* 1.50*  --  1.38*  CALCIUM 8.5*  --   --  8.3*   GFR: Estimated Creatinine Clearance: 54.8 mL/min (A) (by C-G formula based on SCr of 1.38 mg/dL (H)). Liver Function Tests: Recent Labs  Lab 06/18/19 1439 06/19/19 0422  AST 73* 63*  ALT 60* 59*  ALKPHOS 69 68  BILITOT 0.9 1.1  PROT 6.7 6.6  ALBUMIN 2.9* 2.7*   No results for input(s): LIPASE, AMYLASE in the last 168 hours. No results for input(s): AMMONIA in the last 168 hours. Coagulation Profile: Recent Labs  Lab 06/18/19 1439  INR 1.1   Cardiac Enzymes: No results for input(s): CKTOTAL, CKMB, CKMBINDEX, TROPONINI in the last 168 hours. BNP (last 3 results) No results for input(s): PROBNP in the last 8760 hours. HbA1C: Recent Labs    06/19/19 0422  HGBA1C 5.3   CBG: Recent Labs  Lab 06/18/19 1442 06/18/19 1451  GLUCAP 91 86   Lipid Profile: Recent Labs    06/19/19 0422  CHOL 152  HDL 32*  LDLCALC 99  TRIG 107  CHOLHDL 4.8   Thyroid Function Tests: No results for input(s): TSH, T4TOTAL, FREET4, T3FREE, THYROIDAB in the last 72 hours. Anemia Panel: No results for input(s): VITAMINB12, FOLATE, FERRITIN, TIBC, IRON, RETICCTPCT in the last 72 hours. Urine analysis: No results found for: COLORURINE, APPEARANCEUR,  LABSPEC, PHURINE, GLUCOSEU, HGBUR, BILIRUBINUR, KETONESUR, PROTEINUR, UROBILINOGEN, NITRITE, LEUKOCYTESUR Sepsis Labs: @LABRCNTIP (procalcitonin:4,lacticidven:4)  ) Recent Results (from the past 240 hour(s))  Respiratory Panel by RT PCR (Flu A&B, Covid) - Nasopharyngeal Swab     Status: None   Collection Time: 06/18/19  5:51 PM   Specimen: Nasopharyngeal Swab  Result Value Ref Range Status   SARS Coronavirus 2 by RT PCR NEGATIVE NEGATIVE Final    Comment: (NOTE) SARS-CoV-2 target nucleic acids are NOT DETECTED. The SARS-CoV-2 RNA is generally detectable in upper respiratoy specimens during the acute phase of infection. The lowest concentration of SARS-CoV-2 viral copies this assay can detect is 131 copies/mL. A negative result does not preclude SARS-Cov-2 infection and should not be used as the sole basis for treatment or other patient management decisions. A negative result may occur with  improper specimen collection/handling, submission of specimen other than nasopharyngeal swab, presence of viral mutation(s) within the areas  targeted by this assay, and inadequate number of viral copies (<131 copies/mL). A negative result must be combined with clinical observations, patient history, and epidemiological information. The expected result is Negative. Fact Sheet for Patients:  PinkCheek.be Fact Sheet for Healthcare Providers:  GravelBags.it This test is not yet ap proved or cleared by the [place] FDA and  has been authorized for detection and/or diagnosis of SARS-CoV-2 by FDA under an Emergency Use Authorization (EUA). This EUA will remain  in effect (meaning this test can be used) for the duration of the COVID-19 declaration under Section 564(b)(1) of the Act, 21 U.S.C. section 360bbb-3(b)(1), unless the authorization is terminated or revoked sooner.    Influenza A by PCR NEGATIVE NEGATIVE Final   Influenza B by PCR  NEGATIVE NEGATIVE Final    Comment: (NOTE) The Xpert Xpress SARS-CoV-2/FLU/RSV assay is intended as an aid in  the diagnosis of influenza from Nasopharyngeal swab specimens and  should not be used as a sole basis for treatment. Nasal washings and  aspirates are unacceptable for Xpert Xpress SARS-CoV-2/FLU/RSV  testing. Fact Sheet for Patients: PinkCheek.be Fact Sheet for Healthcare Providers: GravelBags.it This test is not yet approved or cleared by the [place] FDA and  has been authorized for detection and/or diagnosis of SARS-CoV-2 by  FDA under an Emergency Use Authorization (EUA). This EUA will remain  in effect (meaning this test can be used) for the duration of the  Covid-19 declaration under Section 564(b)(1) of the Act, 21  U.S.C. section 360bbb-3(b)(1), unless the authorization is  terminated or revoked. Performed at [facility], [place] [address] [place], [place] [address]          Radiology Studies: CT Code Stroke CTA Head W/WO contrast  Result Date: 06/18/2019 CLINICAL DATA:  74 year old male code stroke presentation with right side weakness. EXAM: CT ANGIOGRAPHY HEAD AND NECK CT PERFUSION BRAIN TECHNIQUE: Multidetector CT imaging of the head and neck was performed using the standard protocol during bolus administration of intravenous contrast. Multiplanar CT image reconstructions and MIPs were obtained to evaluate the vascular anatomy. Carotid stenosis measurements (when applicable) are obtained utilizing NASCET criteria, using the distal internal carotid diameter as the denominator. Multiphase CT imaging of the brain was performed following IV bolus contrast injection. Subsequent parametric perfusion maps were calculated using RAPID software. CONTRAST:  100 milliliters Omnipaque 350 COMPARISON:  Plain head CT 1448 hours today. FINDINGS: CT Brain Perfusion Findings: ASPECTS: 10, although a large left PCA  territory infarct was suspected by plain CT. CBF (<30%) Volume: 62mL, corresponding to a portion of the left PCA territory in the occipital lobe Perfusion (Tmax>6.0s) volume: 14mL, although some of this is likely artifactual involving the right temporal lobe portion of which is chronically abnormal. Mismatch Volume: 62mL Infarction Location:Left PCA CTA NECK Skeleton: Carious dentition. No acute osseous abnormality identified. Upper chest: Small bilateral layering pleural effusions. Septal thickening and mild additional peribronchial opacity near the hila in the upper lobes. No superior mediastinal lymphadenopathy. Other neck: Retropharyngeal course of both carotid arteries. Small round 8 millimeter intermediate density soft tissue nodule in the superficial lobe of the left parotid gland. No cervical lymphadenopathy or other neck mass. Aortic arch: Mild Calcified aortic atherosclerosis. 3 vessel arch configuration. Right carotid system: Mild motion artifact at the right CCA origin. Circumferential soft plaque in the right CCA proximal to the bifurcation as it has a retropharyngeal course on series 3, image 125, but less than 50 % stenosis with respect to the  distal vessel. Superimposed calcified plaque at the right carotid bifurcation, and bulky additional soft plaque in the right ICA bulb resulting in a short segment high-grade stenosis approaching a radiographic string sign on series 3, image 94. The right ICA remains patent to the skull base. Left carotid system: Normal left CCA origin. Circumferential soft plaque in the left CCA similar to that on the right without stenosis. Soft and calcified plaque at the left ICA origin and bulb resulting in up to 60 % stenosis with respect to the distal vessel. Left ICA remains patent to the skull base. Vertebral arteries: Mild if any proximal right subclavian artery plaque with no stenosis there or at the right vertebral artery origin. The right vertebral appears dominant  and is patent to the skull base without stenosis. Soft and calcified plaque in the proximal left subclavian artery not resulting in significant subclavian artery stenosis, but there is moderate to severe stenosis at the left vertebral artery origin and V1 segment due to mostly soft plaque (series 6, image 154). The distal left V1 segment is also diminutive and stenotic (image 151). The left V2 segment is intermittently diminutive and stenotic, but the vessel remains patent to the skull base. CTA HEAD Posterior circulation: The right vertebral artery is patent to the vertebrobasilar junction without stenosis. The right PICA origin remains patent. The left V4 segment is occluded distal to the patent left PICA but proximal to the vertebrobasilar junction on series 5, image 132. The basilar artery remains patent to the SCA origins. The left PCA is occluded just beyond its origin (series 9, image 26). No reconstitution. And the right PCA is functionally occluded at its origin with only minimal irregular right PCA branch enhancement. Anterior circulation: Both ICA siphons are patent. On the left there is moderate to severe circumferential calcified plaque in the cavernous segment resulting in moderate to severe stenosis proximal to the anterior genu (series 5, image 102). Moderate additional left supraclinoid segment stenosis. On the right there is similar advanced siphon atherosclerosis with moderate right cavernous and moderate to severe right supraclinoid segment stenosis. Patent right ICA terminus. There is a patent small left posterior communicating artery. Patent carotid termini. Patent MCA and ACA origins. Left A1 is mildly dominant. Anterior communicating artery is patent with a median artery of the corpus callosum. Bilateral ACA branches are patent with mild irregularity. Left MCA M1 segment and left MCA bifurcation are patent without stenosis. Left MCA M3 branches are mildly to moderately irregular. Right MCA M1  segment and bifurcation are patent without stenosis. Right MCA M3 branches are also mildly to moderately irregular. Venous sinuses: Early contrast timing, grossly patent assuming dominant right side transverse and sigmoid sinuses. Anatomic variants: Median artery of the corpus callosum. Review of the MIP images confirms the above findings IMPRESSION: 1. Positive for acute appearing occlusions of the distal Left Vertebral Artery and Left PCA: - distal left V4 occlusion, with upstream multifocal moderate and severe left vertebral stenosis in the neck. - left PCA P1 occlusion just beyond the origin. 2. CTP detects only some of the left PCA territory core infarct which was visible by plain CT. 3. 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 The right vertebral artery and basilar arteries are patent without stenosis. 4. 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 5. Mild to moderate irregularity of the  bilateral MCA branches. 6. Small bilateral pleural effusions with increased pulmonary septal thickening suggestive of pulmonary edema. 7. 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 Recommend follow-up with ENT. Preliminary results of the arterial findings were text paged to Dr. Lorraine Lax via Shea Evans at 1503 hours, and again at 1518 hours. Electronically Signed   By: Genevie Ann M.D.   On: 06/18/2019 15:26   CT Code Stroke CTA Neck W/WO contrast  Result Date: 06/18/2019 CLINICAL DATA:  74 year old male code stroke presentation with right side weakness. EXAM: CT ANGIOGRAPHY HEAD AND NECK CT PERFUSION BRAIN TECHNIQUE: Multidetector CT imaging of the head and neck was performed using the standard protocol during bolus administration of intravenous contrast. Multiplanar CT image reconstructions and MIPs were obtained to evaluate the vascular anatomy.  Carotid stenosis measurements (when applicable) are obtained utilizing NASCET criteria, using the distal internal carotid diameter as the denominator. Multiphase CT imaging of the brain was performed following IV bolus contrast injection. Subsequent parametric perfusion maps were calculated using RAPID software. CONTRAST:  100 milliliters Omnipaque 350 COMPARISON:  Plain head CT 1448 hours today. FINDINGS: CT Brain Perfusion Findings: ASPECTS: 10, although a large left PCA territory infarct was suspected by plain CT. CBF (<30%) Volume: 59mL, corresponding to a portion of the left PCA territory in the occipital lobe Perfusion (Tmax>6.0s) volume: 73mL, although some of this is likely artifactual involving the right temporal lobe portion of which is chronically abnormal. Mismatch Volume: 38mL Infarction Location:Left PCA CTA NECK Skeleton: Carious dentition. No acute osseous abnormality identified. Upper chest: Small bilateral layering pleural effusions. Septal thickening and mild additional peribronchial opacity near the hila in the upper lobes. No superior mediastinal lymphadenopathy. Other neck: Retropharyngeal course of both carotid arteries. Small round 8 millimeter intermediate density soft tissue nodule in the superficial lobe of the left parotid gland. No cervical lymphadenopathy or other neck mass. Aortic arch: Mild Calcified aortic atherosclerosis. 3 vessel arch configuration. Right carotid system: Mild motion artifact at the right CCA origin. Circumferential soft plaque in the right CCA proximal to the bifurcation as it has a retropharyngeal course on series 3, image 125, but less than 50 % stenosis with respect to the distal vessel. Superimposed calcified plaque at the right carotid bifurcation, and bulky additional soft plaque in the right ICA bulb resulting in a short segment high-grade stenosis approaching a radiographic string sign on series 3, image 94. The right ICA remains patent to the skull base.  Left carotid system: Normal left CCA origin. Circumferential soft plaque in the left CCA similar to that on the right without stenosis. Soft and calcified plaque at the left ICA origin and bulb resulting in up to 60 % stenosis with respect to the distal vessel. Left ICA remains patent to the skull base. Vertebral arteries: Mild if any proximal right subclavian artery plaque with no stenosis there or at the right vertebral artery origin. The right vertebral appears dominant and is patent to the skull base without stenosis. Soft and calcified plaque in the proximal left subclavian artery not resulting in significant subclavian artery stenosis, but there is moderate to severe stenosis at the left vertebral artery origin and V1 segment due to mostly soft plaque (series 6, image 154). The distal left V1 segment is also diminutive and stenotic (image 151). The left V2 segment is intermittently diminutive and stenotic, but the vessel remains patent to the skull base. CTA HEAD Posterior circulation: The right vertebral artery is patent to the vertebrobasilar junction without stenosis.  The right PICA origin remains patent. The left V4 segment is occluded distal to the patent left PICA but proximal to the vertebrobasilar junction on series 5, image 132. The basilar artery remains patent to the SCA origins. The left PCA is occluded just beyond its origin (series 9, image 26). No reconstitution. And the right PCA is functionally occluded at its origin with only minimal irregular right PCA branch enhancement. Anterior circulation: Both ICA siphons are patent. On the left there is moderate to severe circumferential calcified plaque in the cavernous segment resulting in moderate to severe stenosis proximal to the anterior genu (series 5, image 102). Moderate additional left supraclinoid segment stenosis. On the right there is similar advanced siphon atherosclerosis with moderate right cavernous and moderate to severe right  supraclinoid segment stenosis. Patent right ICA terminus. There is a patent small left posterior communicating artery. Patent carotid termini. Patent MCA and ACA origins. Left A1 is mildly dominant. Anterior communicating artery is patent with a median artery of the corpus callosum. Bilateral ACA branches are patent with mild irregularity. Left MCA M1 segment and left MCA bifurcation are patent without stenosis. Left MCA M3 branches are mildly to moderately irregular. Right MCA M1 segment and bifurcation are patent without stenosis. Right MCA M3 branches are also mildly to moderately irregular. Venous sinuses: Early contrast timing, grossly patent assuming dominant right side transverse and sigmoid sinuses. Anatomic variants: Median artery of the corpus callosum. Review of the MIP images confirms the above findings IMPRESSION: 1. Positive for acute appearing occlusions of the distal Left Vertebral Artery and Left PCA: - distal left V4 occlusion, with upstream multifocal moderate and severe left vertebral stenosis in the neck. - left PCA P1 occlusion just beyond the origin. 2. CTP detects only some of the left PCA territory core infarct which was visible by plain CT. 3. 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 The right vertebral artery and basilar arteries are patent without stenosis. 4. 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 5. Mild to moderate irregularity of the bilateral MCA branches. 6. Small bilateral pleural effusions with increased pulmonary septal thickening suggestive of pulmonary edema. 7. 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 Recommend follow-up with ENT. Preliminary results of the arterial findings were text paged to Dr. Lorraine Lax via Shea Evans at 1503 hours, and again at 1518 hours. Electronically Signed   By: Genevie Ann M.D.   On: 06/18/2019 15:26   MR BRAIN WO CONTRAST  Result Date: 06/19/2019 CLINICAL DATA:  Stroke follow-up. EXAM: MRI HEAD WITHOUT CONTRAST TECHNIQUE: Multiplanar, multiecho pulse sequences of the brain and surrounding structures were obtained without intravenous contrast. COMPARISON:  Head CT, CTA, and CTP 06/18/2019 FINDINGS: Brain: As seen on CT, there is a large acute left PCA infarct involving the medial aspects of the left temporal and occipital lobes. The left thalamus, posterior limb of the left internal capsule, left splenium of the corpus callosum, left mamillary body, and left cerebral peduncle are also involved. There is also a 7 mm acute cortical infarct in the posterior left frontal lobe which involves the anterior aspect of the precentral gyrus. There is no hemorrhage associated with these acute infarcts. Chronic infarcts are again noted in the right temporal and right occipital lobes. There is a single punctate focus of susceptibility artifact in the right cerebellum suggesting a  chronic microhemorrhage. Small chronic infarcts are noted in the cerebellum bilaterally. Scattered small foci of T2 hyperintensity in the cerebral white matter bilaterally are nonspecific but compatible with minimal chronic small vessel ischemic disease. There is no mass, midline shift, or extra-axial fluid collection. There is mild global cerebral atrophy. Vascular: Abnormal appearance of the distal left vertebral artery corresponding to occlusion on CT. Skull and upper cervical spine: No suspicious marrow lesion. Sinuses/Orbits: Unremarkable orbits. Paranasal sinuses and mastoid air cells are clear. Other: None. IMPRESSION: 1. Large acute left PCA infarct. 2. Small acute left frontal lobe infarct. 3. 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 Electronically Signed   By: Logan Bores M.D.   On: 06/19/2019 11:27   CT Code Stroke Cerebral Perfusion with contrast  Result Date: 06/18/2019 CLINICAL  DATA:  74 year old male code stroke presentation with right side weakness. EXAM: CT ANGIOGRAPHY HEAD AND NECK CT PERFUSION BRAIN TECHNIQUE: Multidetector CT imaging of the head and neck was performed using the standard protocol during bolus administration of intravenous contrast. Multiplanar CT image reconstructions and MIPs were obtained to evaluate the vascular anatomy. Carotid stenosis measurements (when applicable) are obtained utilizing NASCET criteria, using the distal internal carotid diameter as the denominator. Multiphase CT imaging of the brain was performed following IV bolus contrast injection. Subsequent parametric perfusion maps were calculated using RAPID software. CONTRAST:  100 milliliters Omnipaque 350 COMPARISON:  Plain head CT 1448 hours today. FINDINGS: CT Brain Perfusion Findings: ASPECTS: 10, although a large left PCA territory infarct was suspected by plain CT. CBF (<30%) Volume: 14mL, corresponding to a portion of the left PCA territory in the occipital lobe Perfusion (Tmax>6.0s) volume: 17mL, although some of this is likely artifactual involving the right temporal lobe portion of which is chronically abnormal. Mismatch Volume: 56mL Infarction Location:Left PCA CTA NECK Skeleton: Carious dentition. No acute osseous abnormality identified. Upper chest: Small bilateral layering pleural effusions. Septal thickening and mild additional peribronchial opacity near the hila in the upper lobes. No superior mediastinal lymphadenopathy. Other neck: Retropharyngeal course of both carotid arteries. Small round 8 millimeter intermediate density soft tissue nodule in the superficial lobe of the left parotid gland. No cervical lymphadenopathy or other neck mass. Aortic arch: Mild Calcified aortic atherosclerosis. 3 vessel arch configuration. Right carotid system: Mild motion artifact at the right CCA origin. Circumferential soft plaque in the right CCA proximal to the bifurcation as it has a retropharyngeal  course on series 3, image 125, but less than 50 % stenosis with respect to the distal vessel. Superimposed calcified plaque at the right carotid bifurcation, and bulky additional soft plaque in the right ICA bulb resulting in a short segment high-grade stenosis approaching a radiographic string sign on series 3, image 94. The right ICA remains patent to the skull base. Left carotid system: Normal left CCA origin. Circumferential soft plaque in the left CCA similar to that on the right without stenosis. Soft and calcified plaque at the left ICA origin and bulb resulting in up to 60 % stenosis with respect to the distal vessel. Left ICA remains patent to the skull base. Vertebral arteries: Mild if any proximal right subclavian artery plaque with no stenosis there or at the right vertebral artery origin. The right vertebral appears dominant and is patent to the skull base without stenosis. Soft and calcified plaque in the proximal left subclavian artery not resulting in significant subclavian artery stenosis, but there is moderate to severe stenosis at the left vertebral artery origin and V1  segment due to mostly soft plaque (series 6, image 154). The distal left V1 segment is also diminutive and stenotic (image 151). The left V2 segment is intermittently diminutive and stenotic, but the vessel remains patent to the skull base. CTA HEAD Posterior circulation: The right vertebral artery is patent to the vertebrobasilar junction without stenosis. The right PICA origin remains patent. The left V4 segment is occluded distal to the patent left PICA but proximal to the vertebrobasilar junction on series 5, image 132. The basilar artery remains patent to the SCA origins. The left PCA is occluded just beyond its origin (series 9, image 26). No reconstitution. And the right PCA is functionally occluded at its origin with only minimal irregular right PCA branch enhancement. Anterior circulation: Both ICA siphons are patent. On  the left there is moderate to severe circumferential calcified plaque in the cavernous segment resulting in moderate to severe stenosis proximal to the anterior genu (series 5, image 102). Moderate additional left supraclinoid segment stenosis. On the right there is similar advanced siphon atherosclerosis with moderate right cavernous and moderate to severe right supraclinoid segment stenosis. Patent right ICA terminus. There is a patent small left posterior communicating artery. Patent carotid termini. Patent MCA and ACA origins. Left A1 is mildly dominant. Anterior communicating artery is patent with a median artery of the corpus callosum. Bilateral ACA branches are patent with mild irregularity. Left MCA M1 segment and left MCA bifurcation are patent without stenosis. Left MCA M3 branches are mildly to moderately irregular. Right MCA M1 segment and bifurcation are patent without stenosis. Right MCA M3 branches are also mildly to moderately irregular. Venous sinuses: Early contrast timing, grossly patent assuming dominant right side transverse and sigmoid sinuses. Anatomic variants: Median artery of the corpus callosum. Review of the MIP images confirms the above findings IMPRESSION: 1. Positive for acute appearing occlusions of the distal Left Vertebral Artery and Left PCA: - distal left V4 occlusion, with upstream multifocal moderate and severe left vertebral stenosis in the neck. - left PCA P1 occlusion just beyond the origin. 2. CTP detects only some of the left PCA territory core infarct which was visible by plain CT. 3. 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 The right vertebral artery and basilar arteries are patent without stenosis. 4. 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 5. Mild to moderate irregularity of the bilateral MCA  branches. 6. Small bilateral pleural effusions with increased pulmonary septal thickening suggestive of pulmonary edema. 7. 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 Recommend follow-up with ENT. Preliminary results of the arterial findings were text paged to Dr. Lorraine Lax via [facility] at 1503 hours, and again at 1518 hours. Electronically Signed   By: Genevie Ann M.D.   On: 06/18/2019 15:26   XR Chest Single View  Result Date: 06/18/2019 CLINICAL DATA:  Altered behavior EXAM: PORTABLE CHEST 1 VIEW COMPARISON:  None. FINDINGS: Mild cardiomegaly with central vascular congestion. Probable small pleural effusions. Patchy airspace disease left infrahilar lung. No pneumothorax. IMPRESSION: 1. Mild cardiomegaly with central vascular congestion and small pleural effusions. 2. Patchy airspace disease in the left infrahilar lung, atelectasis versus pneumonia. Electronically Signed   By: Donavan Foil M.D.   On: 06/18/2019 17:40   CT HEAD CODE STROKE WO CONTRAST  Result Date: 06/18/2019 CLINICAL DATA:  Code stroke. 74 year old male with  right side weakness and facial droop. EXAM: CT HEAD WITHOUT CONTRAST TECHNIQUE: Contiguous axial images were obtained from the base of the skull through the vertex without intravenous contrast. COMPARISON:  None. FINDINGS: Brain: Large area of hypodense cytotoxic edema demonstrated from the mesial left temporal lobe through the left occipital pole in keeping with a large left PCA territory infarct. Mild regional mass effect. No associated hemorrhage. Comparatively mild hypodensity in the left thalamus. Superimposed chronic appearing encephalomalacia in the right temporal and occipital lobes. No MCA territory acute cortically based infarct changes identified. No acute intracranial hemorrhage identified. No ventriculomegaly. No midline shift and patent basilar cisterns. Vascular: Calcified atherosclerosis at the skull base. No suspicious intracranial vascular hyperdensity. Skull: No  acute osseous abnormality identified. Sinuses/Orbits: Well pneumatized paranasal sinuses. Tympanic cavities and mastoids are clear. Other: Visualized orbits and scalp soft tissues are within normal limits. ASPECTS [facility] Stroke Program Early CT Score) Total score (0-10 with 10 being normal): 10, but not applicable in this case given evidence of large acute left PCA infarct. IMPRESSION: 1. Large acute Left PCA territory infarct. No associated hemorrhage. Minor mass effect. 2. No acute anterior circulation infarct is evident (ASPECTS 10). 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 3. These results were communicated to Dr. Lorraine Lax at 2:59 pm on 06/18/2019 by text page via the [facility] messaging system. Electronically Signed   By: Genevie Ann M.D.   On: 06/18/2019 15:02        Scheduled Meds: .  stroke: mapping our early stages of recovery book   Does not apply Once  . aspirin  81 mg Oral Daily  . atorvastatin  80 mg Oral q1800  . ipratropium-albuterol  3 mL Nebulization Q6H   Continuous Infusions: . sodium chloride 75 mL/hr at 06/18/19 2132  . azithromycin    . cefTRIAXone (ROCEPHIN)  IV    . heparin 1,400 Units/hr (06/19/19 0640)     LOS: 1 day    Time spent: 35 minutes   Dana Allan, MD  [facility] Pager #: [phone] 7PM-7AM contact night coverage as above

## 2019-06-20 ENCOUNTER — Inpatient Hospital Stay (HOSPITAL_COMMUNITY): Payer: Medicare Other

## 2019-06-20 LAB — GLUCOSE, CAPILLARY
Glucose-Capillary: 89 mg/dL (ref 70–99)
Glucose-Capillary: 90 mg/dL (ref 70–99)
Glucose-Capillary: 92 mg/dL (ref 70–99)
Glucose-Capillary: 92 mg/dL (ref 70–99)
Glucose-Capillary: 94 mg/dL (ref 70–99)
Glucose-Capillary: 94 mg/dL (ref 70–99)

## 2019-06-20 LAB — RAPID URINE DRUG SCREEN, HOSP PERFORMED
Amphetamines: NOT DETECTED
Barbiturates: NOT DETECTED
Benzodiazepines: NOT DETECTED
Cocaine: NOT DETECTED
Opiates: NOT DETECTED
Tetrahydrocannabinol: NOT DETECTED

## 2019-06-20 LAB — CREATININE, SERUM
Creatinine, Ser: 1.49 mg/dL — ABNORMAL HIGH (ref 0.61–1.24)
GFR calc Af Amer: 53 mL/min — ABNORMAL LOW (ref >60)
GFR calc non Af Amer: 46 mL/min — ABNORMAL LOW (ref >60)

## 2019-06-20 LAB — BLOOD GAS, ARTERIAL
Acid-base deficit: 4.8 mmol/L — ABNORMAL HIGH (ref 0.0–2.0)
Bicarbonate: 18.7 mmol/L — ABNORMAL LOW (ref 20.0–28.0)
FIO2: 21
O2 Saturation: 95.8 %
Patient temperature: 36.1
pCO2 arterial: 27.3 mmHg — ABNORMAL LOW (ref 32.0–48.0)
pH, Arterial: 7.445 (ref 7.350–7.450)
pO2, Arterial: 79.1 mmHg — ABNORMAL LOW (ref 83.0–108.0)

## 2019-06-20 LAB — PROTEIN / CREATININE RATIO, URINE
Creatinine, Urine: 167.88 mg/dL
Protein Creatinine Ratio: 0.45 mg/mg{Cre} — ABNORMAL HIGH (ref 0.00–0.15)
Total Protein, Urine: 76 mg/dL

## 2019-06-20 LAB — BASIC METABOLIC PANEL
Anion gap: 11 (ref 5–15)
BUN: 33 mg/dL — ABNORMAL HIGH (ref 8–23)
CO2: 18 mmol/L — ABNORMAL LOW (ref 22–32)
Calcium: 8 mg/dL — ABNORMAL LOW (ref 8.9–10.3)
Chloride: 112 mmol/L — ABNORMAL HIGH (ref 98–111)
Creatinine, Ser: 1.37 mg/dL — ABNORMAL HIGH (ref 0.61–1.24)
GFR calc Af Amer: 59 mL/min — ABNORMAL LOW (ref >60)
GFR calc non Af Amer: 51 mL/min — ABNORMAL LOW (ref >60)
Glucose, Bld: 100 mg/dL — ABNORMAL HIGH (ref 70–99)
Potassium: 4 mmol/L (ref 3.5–5.1)
Sodium: 141 mmol/L (ref 135–145)

## 2019-06-20 LAB — CBC
HCT: 39.5 % (ref 39.0–52.0)
HCT: 40.3 % (ref 39.0–52.0)
Hemoglobin: 12.6 g/dL — ABNORMAL LOW (ref 13.0–17.0)
Hemoglobin: 12.8 g/dL — ABNORMAL LOW (ref 13.0–17.0)
MCH: 31.3 pg (ref 26.0–34.0)
MCH: 31.4 pg (ref 26.0–34.0)
MCHC: 31.9 g/dL (ref 30.0–36.0)
MCHC: 31.8 g/dL (ref 30.0–36.0)
MCV: 98.3 fL (ref 80.0–100.0)
MCV: 99 fL (ref 80.0–100.0)
Platelets: 266 10*3/uL (ref 150–400)
Platelets: 273 10*3/uL (ref 150–400)
RBC: 4.02 MIL/uL — ABNORMAL LOW (ref 4.22–5.81)
RBC: 4.07 MIL/uL — ABNORMAL LOW (ref 4.22–5.81)
RDW: 13.2 % (ref 11.5–15.5)
RDW: 13.2 % (ref 11.5–15.5)
WBC: 18.4 10*3/uL — ABNORMAL HIGH (ref 4.0–10.5)
WBC: 16.6 10*3/uL — ABNORMAL HIGH (ref 4.0–10.5)
nRBC: 0 % (ref 0.0–0.2)
nRBC: 0 % (ref 0.0–0.2)

## 2019-06-20 LAB — MAGNESIUM: Magnesium: 2.6 mg/dL — ABNORMAL HIGH (ref 1.7–2.4)

## 2019-06-20 LAB — SODIUM, URINE, RANDOM: Sodium, Ur: 11 mmol/L

## 2019-06-20 LAB — HEPARIN LEVEL (UNFRACTIONATED): Heparin Unfractionated: 0.56 IU/mL (ref 0.30–0.70)

## 2019-06-20 MED ORDER — HEPARIN SODIUM (PORCINE) 5000 UNIT/ML IJ SOLN
5000.0000 [IU] | Freq: Three times a day (TID) | INTRAMUSCULAR | Status: DC
  Administered 2019-06-20 – 2019-07-05 (×44): 5000 [IU] via SUBCUTANEOUS
  Filled 2019-06-20 (×44): qty 1

## 2019-06-20 MED ORDER — ASPIRIN 300 MG RE SUPP
300.0000 mg | Freq: Every day | RECTAL | Status: DC
  Administered 2019-06-20 – 2019-06-21 (×2): 300 mg via RECTAL
  Filled 2019-06-20 (×2): qty 1

## 2019-06-20 MED ORDER — ASPIRIN 300 MG RE SUPP: 300.0000 mg | Freq: Every day | RECTAL | Status: DC

## 2019-06-20 MED ORDER — FUROSEMIDE 10 MG/ML IJ SOLN
40.0000 mg | Freq: Once | INTRAMUSCULAR | Status: DC
  Filled 2019-06-20: qty 4

## 2019-06-20 NOTE — Progress Notes (Addendum)
2230 Attempted to insert [place] NGT x2 in each nare with a total of 4 attempts unsuccessfully. Notified Dr Silas [place] who said to just hold off until the Cortrak can be inserted.

## 2019-06-20 NOTE — Progress Notes (Signed)
Progress Note  Patient Name: Levi Pruitt Date of Encounter: 06/20/2019  Primary Cardiologist:   No primary care provider on file.   Subjective   Opens eyes.  Dysarthric.  No obvious distress  Inpatient Medications    Scheduled Meds: .  stroke: mapping our early stages of recovery book   Does not apply Once  . aspirin  81 mg Oral Daily  . atorvastatin  80 mg Oral q1800  . free water  200 mL Per Tube Q3H  . furosemide  40 mg Intravenous Once  . ipratropium-albuterol  3 mL Nebulization Q6H   Continuous Infusions: . azithromycin 500 mg (06/19/19 1522)  . cefTRIAXone (ROCEPHIN)  IV Stopped (06/19/19 1521)  . feeding supplement (JEVITY 1.2 CAL)    . heparin 1,600 Units/hr (06/20/19 0856)   PRN Meds: acetaminophen **OR** acetaminophen (TYLENOL) oral liquid 160 mg/5 mL **OR** acetaminophen, ipratropium-albuterol   Vital Signs    Vitals:   06/20/19 0606 06/20/19 0609 06/20/19 0715 06/20/19 0742  BP:   129/66   Pulse: 66 66 77   Resp: (!) 22 (!) 22 (!) 22   Temp:    98.1 F (36.7 C)  TempSrc:    Oral  SpO2: 97% 97% 96%   Weight:      Height:        Intake/Output Summary (Last 24 hours) at 06/20/2019 0948 Last data filed at 06/20/2019 0500 Gross per 24 hour  Intake 740.5 ml  Output --  Net 740.5 ml   Filed Weights   06/19/19 0624 06/20/19 0323  Weight: 97.3 kg 104 kg    Telemetry    NSR, frequent ventricular ectopy.  No atrial fib.   ECG    NA - Personally Reviewed  Physical Exam   GEN: No  acute distress.   Neck: No  JVD Cardiac: RRR, no murmurs, rubs, or gallops.  Respiratory: Clear   to auscultation bilaterally. GI: Soft, nontender, non-distended, normal bowel sounds  MS:  Trace edema; No deformity. Neuro:   Right hemiparesis.   Psych:   Unable to assess.     Labs    Chemistry Recent Labs  Lab 06/18/19 1439 06/18/19 1447 06/19/19 0422 06/19/19 1248 06/20/19 0407  NA 138 137 139 141 141  K 3.7 3.6 4.0 4.0 4.0  CL 105 104 107  --  112*   CO2 20*  --  20*  --  18*  GLUCOSE 102* 95 96  --  100*  BUN 43* 41* 38*  --  33*  CREATININE 1.66* 1.50* 1.38*  --  1.37*  CALCIUM 8.5*  --  8.3*  --  8.0*  PROT 6.7  --  6.6  --   --   ALBUMIN 2.9*  --  2.7*  --   --   AST 73*  --  63*  --   --   ALT 60*  --  59*  --   --   ALKPHOS 69  --  68  --   --   BILITOT 0.9  --  1.1  --   --   GFRNONAA 40*  --  50*  --  51*  GFRAA 47*  --  58*  --  59*  ANIONGAP 13  --  12  --  11     Hematology Recent Labs  Lab 06/18/19 1439 06/19/19 0422 06/19/19 1248 06/20/19 0407  WBC 17.3* 15.7*  --  18.4*  RBC 4.39 4.33  --  4.02*  HGB 13.9 13.6  13.3 12.6*  HCT 42.7 43.7 39.0 39.5  MCV 97.3 100.9*  --  98.3  MCH 31.7 31.4  --  31.3  MCHC 32.6 31.1  --  31.9  RDW 12.9 13.2  --  13.2  PLT 243 239  --  266    Cardiac EnzymesNo results for input(s): TROPONINI in the last 168 hours. No results for input(s): TROPIPOC in the last 168 hours.   BNP Recent Labs  Lab 06/19/19 1515  BNP 756.3*     DDimer No results for input(s): DDIMER in the last 168 hours.   Radiology    CT Code Stroke CTA Head W/WO contrast  Result Date: 06/18/2019 CLINICAL DATA:  74 year old male code stroke presentation with right side weakness. EXAM: CT ANGIOGRAPHY HEAD AND NECK CT PERFUSION BRAIN TECHNIQUE: Multidetector CT imaging of the head and neck was performed using the standard protocol during bolus administration of intravenous contrast. Multiplanar CT image reconstructions and MIPs were obtained to evaluate the vascular anatomy. Carotid stenosis measurements (when applicable) are obtained utilizing NASCET criteria, using the distal internal carotid diameter as the denominator. Multiphase CT imaging of the brain was performed following IV bolus contrast injection. Subsequent parametric perfusion maps were calculated using RAPID software. CONTRAST:  100 milliliters Omnipaque 350 COMPARISON:  Plain head CT 1448 hours today. FINDINGS: CT Brain Perfusion Findings:  ASPECTS: 10, although a large left PCA territory infarct was suspected by plain CT. CBF (<30%) Volume: 66mL, corresponding to a portion of the left PCA territory in the occipital lobe Perfusion (Tmax>6.0s) volume: 54mL, although some of this is likely artifactual involving the right temporal lobe portion of which is chronically abnormal. Mismatch Volume: 33mL Infarction Location:Left PCA CTA NECK Skeleton: Carious dentition. No acute osseous abnormality identified. Upper chest: Small bilateral layering pleural effusions. Septal thickening and mild additional peribronchial opacity near the hila in the upper lobes. No superior mediastinal lymphadenopathy. Other neck: Retropharyngeal course of both carotid arteries. Small round 8 millimeter intermediate density soft tissue nodule in the superficial lobe of the left parotid gland. No cervical lymphadenopathy or other neck mass. Aortic arch: Mild Calcified aortic atherosclerosis. 3 vessel arch configuration. Right carotid system: Mild motion artifact at the right CCA origin. Circumferential soft plaque in the right CCA proximal to the bifurcation as it has a retropharyngeal course on series 3, image 125, but less than 50 % stenosis with respect to the distal vessel. Superimposed calcified plaque at the right carotid bifurcation, and bulky additional soft plaque in the right ICA bulb resulting in a short segment high-grade stenosis approaching a radiographic string sign on series 3, image 94. The right ICA remains patent to the skull base. Left carotid system: Normal left CCA origin. Circumferential soft plaque in the left CCA similar to that on the right without stenosis. Soft and calcified plaque at the left ICA origin and bulb resulting in up to 60 % stenosis with respect to the distal vessel. Left ICA remains patent to the skull base. Vertebral arteries: Mild if any proximal right subclavian artery plaque with no stenosis there or at the right vertebral artery origin.  The right vertebral appears dominant and is patent to the skull base without stenosis. Soft and calcified plaque in the proximal left subclavian artery not resulting in significant subclavian artery stenosis, but there is moderate to severe stenosis at the left vertebral artery origin and V1 segment due to mostly soft plaque (series 6, image 154). The distal left V1 segment is also diminutive and stenotic (  image 151). The left V2 segment is intermittently diminutive and stenotic, but the vessel remains patent to the skull base. CTA HEAD Posterior circulation: The right vertebral artery is patent to the vertebrobasilar junction without stenosis. The right PICA origin remains patent. The left V4 segment is occluded distal to the patent left PICA but proximal to the vertebrobasilar junction on series 5, image 132. The basilar artery remains patent to the SCA origins. The left PCA is occluded just beyond its origin (series 9, image 26). No reconstitution. And the right PCA is functionally occluded at its origin with only minimal irregular right PCA branch enhancement. Anterior circulation: Both ICA siphons are patent. On the left there is moderate to severe circumferential calcified plaque in the cavernous segment resulting in moderate to severe stenosis proximal to the anterior genu (series 5, image 102). Moderate additional left supraclinoid segment stenosis. On the right there is similar advanced siphon atherosclerosis with moderate right cavernous and moderate to severe right supraclinoid segment stenosis. Patent right ICA terminus. There is a patent small left posterior communicating artery. Patent carotid termini. Patent MCA and ACA origins. Left A1 is mildly dominant. Anterior communicating artery is patent with a median artery of the corpus callosum. Bilateral ACA branches are patent with mild irregularity. Left MCA M1 segment and left MCA bifurcation are patent without stenosis. Left MCA M3 branches are mildly  to moderately irregular. Right MCA M1 segment and bifurcation are patent without stenosis. Right MCA M3 branches are also mildly to moderately irregular. Venous sinuses: Early contrast timing, grossly patent assuming dominant right side transverse and sigmoid sinuses. Anatomic variants: Median artery of the corpus callosum. Review of the MIP images confirms the above findings IMPRESSION: 1. Positive for acute appearing occlusions of the distal Left Vertebral Artery and Left PCA: - distal left V4 occlusion, with upstream multifocal moderate and severe left vertebral stenosis in the neck. - left PCA P1 occlusion just beyond the origin. 2. CTP detects only some of the left PCA territory core infarct which was visible by plain CT. 3. 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 The right vertebral artery and basilar arteries are patent without stenosis. 4. 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 5. Mild to moderate irregularity of the bilateral MCA branches. 6. Small bilateral pleural effusions with increased pulmonary septal thickening suggestive of pulmonary edema. 7. 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 Recommend follow-up with ENT. Preliminary results of the arterial findings were text paged to Dr. Lorraine Lax via Shea Evans at 1503 hours, and again at 1518 hours. Electronically Signed   By: Genevie Ann M.D.   On: 06/18/2019 15:26   CT Code Stroke CTA Neck W/WO contrast  Result Date: 06/18/2019 CLINICAL DATA:  74 year old male code stroke presentation with right side weakness. EXAM: CT ANGIOGRAPHY HEAD AND NECK CT PERFUSION BRAIN TECHNIQUE: Multidetector CT imaging of the head and neck was performed using the standard protocol during bolus administration of intravenous contrast. Multiplanar CT image reconstructions and MIPs were obtained  to evaluate the vascular anatomy. Carotid stenosis measurements (when applicable) are obtained utilizing NASCET criteria, using the distal internal carotid diameter as the denominator. Multiphase CT imaging of the brain was performed following IV bolus contrast injection. Subsequent parametric perfusion maps were calculated using RAPID software. CONTRAST:  100 milliliters Omnipaque 350 COMPARISON:  Plain head CT  1448 hours today. FINDINGS: CT Brain Perfusion Findings: ASPECTS: 10, although a large left PCA territory infarct was suspected by plain CT. CBF (<30%) Volume: 98mL, corresponding to a portion of the left PCA territory in the occipital lobe Perfusion (Tmax>6.0s) volume: 22mL, although some of this is likely artifactual involving the right temporal lobe portion of which is chronically abnormal. Mismatch Volume: 70mL Infarction Location:Left PCA CTA NECK Skeleton: Carious dentition. No acute osseous abnormality identified. Upper chest: Small bilateral layering pleural effusions. Septal thickening and mild additional peribronchial opacity near the hila in the upper lobes. No superior mediastinal lymphadenopathy. Other neck: Retropharyngeal course of both carotid arteries. Small round 8 millimeter intermediate density soft tissue nodule in the superficial lobe of the left parotid gland. No cervical lymphadenopathy or other neck mass. Aortic arch: Mild Calcified aortic atherosclerosis. 3 vessel arch configuration. Right carotid system: Mild motion artifact at the right CCA origin. Circumferential soft plaque in the right CCA proximal to the bifurcation as it has a retropharyngeal course on series 3, image 125, but less than 50 % stenosis with respect to the distal vessel. Superimposed calcified plaque at the right carotid bifurcation, and bulky additional soft plaque in the right ICA bulb resulting in a short segment high-grade stenosis approaching a radiographic string sign on series 3, image 94. The right ICA  remains patent to the skull base. Left carotid system: Normal left CCA origin. Circumferential soft plaque in the left CCA similar to that on the right without stenosis. Soft and calcified plaque at the left ICA origin and bulb resulting in up to 60 % stenosis with respect to the distal vessel. Left ICA remains patent to the skull base. Vertebral arteries: Mild if any proximal right subclavian artery plaque with no stenosis there or at the right vertebral artery origin. The right vertebral appears dominant and is patent to the skull base without stenosis. Soft and calcified plaque in the proximal left subclavian artery not resulting in significant subclavian artery stenosis, but there is moderate to severe stenosis at the left vertebral artery origin and V1 segment due to mostly soft plaque (series 6, image 154). The distal left V1 segment is also diminutive and stenotic (image 151). The left V2 segment is intermittently diminutive and stenotic, but the vessel remains patent to the skull base. CTA HEAD Posterior circulation: The right vertebral artery is patent to the vertebrobasilar junction without stenosis. The right PICA origin remains patent. The left V4 segment is occluded distal to the patent left PICA but proximal to the vertebrobasilar junction on series 5, image 132. The basilar artery remains patent to the SCA origins. The left PCA is occluded just beyond its origin (series 9, image 26). No reconstitution. And the right PCA is functionally occluded at its origin with only minimal irregular right PCA branch enhancement. Anterior circulation: Both ICA siphons are patent. On the left there is moderate to severe circumferential calcified plaque in the cavernous segment resulting in moderate to severe stenosis proximal to the anterior genu (series 5, image 102). Moderate additional left supraclinoid segment stenosis. On the right there is similar advanced siphon atherosclerosis with moderate right cavernous  and moderate to severe right supraclinoid segment stenosis. Patent right ICA terminus. There is a patent small left posterior communicating artery. Patent carotid termini. Patent MCA and ACA origins. Left A1 is mildly dominant. Anterior communicating artery is patent with a median artery of the corpus callosum. Bilateral ACA branches are patent with mild irregularity. Left MCA M1 segment and  left MCA bifurcation are patent without stenosis. Left MCA M3 branches are mildly to moderately irregular. Right MCA M1 segment and bifurcation are patent without stenosis. Right MCA M3 branches are also mildly to moderately irregular. Venous sinuses: Early contrast timing, grossly patent assuming dominant right side transverse and sigmoid sinuses. Anatomic variants: Median artery of the corpus callosum. Review of the MIP images confirms the above findings IMPRESSION: 1. Positive for acute appearing occlusions of the distal Left Vertebral Artery and Left PCA: - distal left V4 occlusion, with upstream multifocal moderate and severe left vertebral stenosis in the neck. - left PCA P1 occlusion just beyond the origin. 2. CTP detects only some of the left PCA territory core infarct which was visible by plain CT. 3. 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 The right vertebral artery and basilar arteries are patent without stenosis. 4. 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 5. Mild to moderate irregularity of the bilateral MCA branches. 6. Small bilateral pleural effusions with increased pulmonary septal thickening suggestive of pulmonary edema. 7. 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 Recommend follow-up with ENT. Preliminary results of the arterial findings were text paged to Dr. Lorraine Lax via Shea Evans at 1503 hours, and again at 1518 hours.  Electronically Signed   By: Genevie Ann M.D.   On: 06/18/2019 15:26   MR BRAIN WO CONTRAST  Result Date: 06/19/2019 CLINICAL DATA:  Stroke follow-up. EXAM: MRI HEAD WITHOUT CONTRAST TECHNIQUE: Multiplanar, multiecho pulse sequences of the brain and surrounding structures were obtained without intravenous contrast. COMPARISON:  Head CT, CTA, and CTP 06/18/2019 FINDINGS: Brain: As seen on CT, there is a large acute left PCA infarct involving the medial aspects of the left temporal and occipital lobes. The left thalamus, posterior limb of the left internal capsule, left splenium of the corpus callosum, left mamillary body, and left cerebral peduncle are also involved. There is also a 7 mm acute cortical infarct in the posterior left frontal lobe which involves the anterior aspect of the precentral gyrus. There is no hemorrhage associated with these acute infarcts. Chronic infarcts are again noted in the right temporal and right occipital lobes. There is a single punctate focus of susceptibility artifact in the right cerebellum suggesting a chronic microhemorrhage. Small chronic infarcts are noted in the cerebellum bilaterally. Scattered small foci of T2 hyperintensity in the cerebral white matter bilaterally are nonspecific but compatible with minimal chronic small vessel ischemic disease. There is no mass, midline shift, or extra-axial fluid collection. There is mild global cerebral atrophy. Vascular: Abnormal appearance of the distal left vertebral artery corresponding to occlusion on CT. Skull and upper cervical spine: No suspicious marrow lesion. Sinuses/Orbits: Unremarkable orbits. Paranasal sinuses and mastoid air cells are clear. Other: None. IMPRESSION: 1. Large acute left PCA infarct. 2. Small acute left frontal lobe infarct. 3. 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 Electronically Signed   By: Logan Bores M.D.   On: 06/19/2019 11:27   CT Code Stroke Cerebral Perfusion with  contrast  Result Date: 06/18/2019 CLINICAL DATA:  74 year old male code stroke presentation with right side weakness. EXAM: CT ANGIOGRAPHY HEAD AND NECK CT PERFUSION BRAIN TECHNIQUE: Multidetector CT imaging of the head and neck was performed using the standard protocol during bolus administration of intravenous contrast. Multiplanar CT image reconstructions and MIPs were obtained to  evaluate the vascular anatomy. Carotid stenosis measurements (when applicable) are obtained utilizing NASCET criteria, using the distal internal carotid diameter as the denominator. Multiphase CT imaging of the brain was performed following IV bolus contrast injection. Subsequent parametric perfusion maps were calculated using RAPID software. CONTRAST:  100 milliliters Omnipaque 350 COMPARISON:  Plain head CT 1448 hours today. FINDINGS: CT Brain Perfusion Findings: ASPECTS: 10, although a large left PCA territory infarct was suspected by plain CT. CBF (<30%) Volume: 39mL, corresponding to a portion of the left PCA territory in the occipital lobe Perfusion (Tmax>6.0s) volume: 27mL, although some of this is likely artifactual involving the right temporal lobe portion of which is chronically abnormal. Mismatch Volume: 44mL Infarction Location:Left PCA CTA NECK Skeleton: Carious dentition. No acute osseous abnormality identified. Upper chest: Small bilateral layering pleural effusions. Septal thickening and mild additional peribronchial opacity near the hila in the upper lobes. No superior mediastinal lymphadenopathy. Other neck: Retropharyngeal course of both carotid arteries. Small round 8 millimeter intermediate density soft tissue nodule in the superficial lobe of the left parotid gland. No cervical lymphadenopathy or other neck mass. Aortic arch: Mild Calcified aortic atherosclerosis. 3 vessel arch configuration. Right carotid system: Mild motion artifact at the right CCA origin. Circumferential soft plaque in the right CCA proximal to  the bifurcation as it has a retropharyngeal course on series 3, image 125, but less than 50 % stenosis with respect to the distal vessel. Superimposed calcified plaque at the right carotid bifurcation, and bulky additional soft plaque in the right ICA bulb resulting in a short segment high-grade stenosis approaching a radiographic string sign on series 3, image 94. The right ICA remains patent to the skull base. Left carotid system: Normal left CCA origin. Circumferential soft plaque in the left CCA similar to that on the right without stenosis. Soft and calcified plaque at the left ICA origin and bulb resulting in up to 60 % stenosis with respect to the distal vessel. Left ICA remains patent to the skull base. Vertebral arteries: Mild if any proximal right subclavian artery plaque with no stenosis there or at the right vertebral artery origin. The right vertebral appears dominant and is patent to the skull base without stenosis. Soft and calcified plaque in the proximal left subclavian artery not resulting in significant subclavian artery stenosis, but there is moderate to severe stenosis at the left vertebral artery origin and V1 segment due to mostly soft plaque (series 6, image 154). The distal left V1 segment is also diminutive and stenotic (image 151). The left V2 segment is intermittently diminutive and stenotic, but the vessel remains patent to the skull base. CTA HEAD Posterior circulation: The right vertebral artery is patent to the vertebrobasilar junction without stenosis. The right PICA origin remains patent. The left V4 segment is occluded distal to the patent left PICA but proximal to the vertebrobasilar junction on series 5, image 132. The basilar artery remains patent to the SCA origins. The left PCA is occluded just beyond its origin (series 9, image 26). No reconstitution. And the right PCA is functionally occluded at its origin with only minimal irregular right PCA branch enhancement. Anterior  circulation: Both ICA siphons are patent. On the left there is moderate to severe circumferential calcified plaque in the cavernous segment resulting in moderate to severe stenosis proximal to the anterior genu (series 5, image 102). Moderate additional left supraclinoid segment stenosis. On the right there is similar advanced siphon atherosclerosis with moderate right cavernous and moderate to severe  right supraclinoid segment stenosis. Patent right ICA terminus. There is a patent small left posterior communicating artery. Patent carotid termini. Patent MCA and ACA origins. Left A1 is mildly dominant. Anterior communicating artery is patent with a median artery of the corpus callosum. Bilateral ACA branches are patent with mild irregularity. Left MCA M1 segment and left MCA bifurcation are patent without stenosis. Left MCA M3 branches are mildly to moderately irregular. Right MCA M1 segment and bifurcation are patent without stenosis. Right MCA M3 branches are also mildly to moderately irregular. Venous sinuses: Early contrast timing, grossly patent assuming dominant right side transverse and sigmoid sinuses. Anatomic variants: Median artery of the corpus callosum. Review of the MIP images confirms the above findings IMPRESSION: 1. Positive for acute appearing occlusions of the distal Left Vertebral Artery and Left PCA: - distal left V4 occlusion, with upstream multifocal moderate and severe left vertebral stenosis in the neck. - left PCA P1 occlusion just beyond the origin. 2. CTP detects only some of the left PCA territory core infarct which was visible by plain CT. 3. 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 The right vertebral artery and basilar arteries are patent without stenosis. 4. 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 5. Mild to  moderate irregularity of the bilateral MCA branches. 6. Small bilateral pleural effusions with increased pulmonary septal thickening suggestive of pulmonary edema. 7. 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 Recommend follow-up with ENT. Preliminary results of the arterial findings were text paged to Dr. Lorraine Lax via Shea Evans at 1503 hours, and again at 1518 hours. Electronically Signed   By: Genevie Ann M.D.   On: 06/18/2019 15:26   DG CHEST PORT 1 VIEW  Result Date: 06/19/2019 CLINICAL DATA:  Acute left PCA infarct EXAM: PORTABLE CHEST 1 VIEW COMPARISON:  06/18/2019 FINDINGS: Stable cardiomegaly with central vascular congestion. Left basilar airspace opacity/consolidation obscures the left hemidiaphragm compatible with atelectasis and or pneumonia. Right lung remains clear. No large effusion or pneumothorax. Trachea midline. Degenerative changes of the spine. IMPRESSION: Cardiomegaly with vascular congestion Left basilar atelectasis/pneumonia. Electronically Signed   By: Jerilynn Mages.  Shick M.D.   On: 06/19/2019 13:48   XR Chest Single View  Result Date: 06/18/2019 CLINICAL DATA:  Altered behavior EXAM: PORTABLE CHEST 1 VIEW COMPARISON:  None. FINDINGS: Mild cardiomegaly with central vascular congestion. Probable small pleural effusions. Patchy airspace disease left infrahilar lung. No pneumothorax. IMPRESSION: 1. Mild cardiomegaly with central vascular congestion and small pleural effusions. 2. Patchy airspace disease in the left infrahilar lung, atelectasis versus pneumonia. Electronically Signed   By: Donavan Foil M.D.   On: 06/18/2019 17:40   ECHOCARDIOGRAM COMPLETE  Result Date: 06/19/2019   ECHOCARDIOGRAM REPORT   Patient Name:   Levi Pruitt Date of Exam: 06/19/2019 Medical Rec #:  [id number]   Height:       69.0 in Accession #:    [id number]  Weight:       214.5 lb Date of Birth:  07/27/1945   BSA:          2.13 m Patient Age:    25 years    BP:           126/69 mmHg Patient Gender: M           HR:  69 bpm. Exam Location:  Inpatient Procedure: 2D Echo Indications:    STEMI I21.3  History:        Patient has no prior history of Echocardiogram examinations.                 Stroke. AKI (acute kidney injury)                 Abnormal ECG.  Sonographer:    Vikki Ports Turrentine Referring Phys: [facility]  1. Left ventricular ejection fraction, by visual estimation, is 25 to 30%. The left ventricle has severely decreased function. There is no left ventricular hypertrophy.  2. Elevated left atrial pressure.  3. Left ventricular diastolic parameters are consistent with Grade III diastolic dysfunction (restrictive).  4. Mildly dilated left ventricular internal cavity size.  5. The left ventricle demonstrates regional wall motion abnormalities.  6. Global right ventricle has normal systolic function.The right ventricular size is normal.  7. Left atrial size was normal.  8. Right atrial size was normal.  9. The mitral valve is normal in structure. Mild mitral valve regurgitation. No evidence of mitral stenosis. 10. The tricuspid valve is normal in structure. 11. The aortic valve is tricuspid. Aortic valve regurgitation is not visualized. Mild aortic valve sclerosis without stenosis. 12. The pulmonic valve was not well visualized. Pulmonic valve regurgitation is trivial. 13. The inferior vena cava is dilated in size with >50% respiratory variability, suggesting right atrial pressure of 8 mmHg. 14. Akinesis of the inferior, inferolateral, apical and distal septal walls; overall severe LV dysfunction; restrictive filling; mild LVE; mild MR. FINDINGS  Left Ventricle: Left ventricular ejection fraction, by visual estimation, is 25 to 30%. The left ventricle has severely decreased function. The left ventricle demonstrates regional wall motion abnormalities. The left ventricular internal cavity size was  mildly dilated left ventricle. There is no left ventricular hypertrophy. Left ventricular diastolic  parameters are consistent with Grade III diastolic dysfunction (restrictive). Elevated left atrial pressure. Right Ventricle: The right ventricular size is normal.Global RV systolic function is has normal systolic function. Left Atrium: Left atrial size was normal in size. Right Atrium: Right atrial size was normal in size Pericardium: There is no evidence of pericardial effusion. Mitral Valve: The mitral valve is normal in structure. Mild mitral valve regurgitation. No evidence of mitral valve stenosis by observation. Tricuspid Valve: The tricuspid valve is normal in structure. Tricuspid valve regurgitation is trivial. Aortic Valve: The aortic valve is tricuspid. Aortic valve regurgitation is not visualized. Mild aortic valve sclerosis is present, with no evidence of aortic valve stenosis. Pulmonic Valve: The pulmonic valve was not well visualized. Pulmonic valve regurgitation is trivial. Pulmonic regurgitation is trivial. Aorta: The aortic root is normal in size and structure. Venous: The inferior vena cava is dilated in size with greater than 50% respiratory variability, suggesting right atrial pressure of 8 mmHg. IAS/Shunts: No atrial level shunt detected by color flow Doppler. Additional Comments: Akinesis of the inferior, inferolateral, apical and distal septal walls; overall severe LV dysfunction; restrictive filling; mild LVE; mild MR.  LEFT VENTRICLE PLAX 2D LVIDd:         5.80 cm       Diastology LVIDs:         4.60 cm       LV e' lateral:   5.33 cm/s LV PW:         0.90 cm       LV E/e' lateral: 22.3 LV IVS:  0.90 cm       LV e' medial:    3.68 cm/s LVOT diam:     2.00 cm       LV E/e' medial:  32.3 LV SV:         69 ml LV SV Index:   31.38 LVOT Area:     3.14 cm  LV Volumes (MOD) LV area d, A2C:    28.50 cm LV area d, A4C:    33.00 cm LV area s, A2C:    23.30 cm LV area s, A4C:    26.30 cm LV major d, A2C:   7.37 cm LV major d, A4C:   7.76 cm LV major s, A2C:   7.40 cm LV major s, A4C:   7.50  cm LV vol d, MOD A2C: 93.2 ml LV vol d, MOD A4C: 119.0 ml LV vol s, MOD A2C: 63.6 ml LV vol s, MOD A4C: 77.4 ml LV SV MOD A2C:     29.6 ml LV SV MOD A4C:     119.0 ml LV SV MOD BP:      37.6 ml RIGHT VENTRICLE RV S prime:     13.70 cm/s TAPSE (M-mode): 2.0 cm LEFT ATRIUM             Index       RIGHT ATRIUM           Index LA diam:        4.10 cm 1.93 cm/m  RA Area:     18.10 cm LA Vol (A2C):   60.5 ml 28.43 ml/m RA Volume:   49.30 ml  23.16 ml/m LA Vol (A4C):   68.4 ml 32.14 ml/m LA Biplane Vol: 64.2 ml 30.16 ml/m  AORTIC VALVE LVOT Vmax:   94.60 cm/s LVOT Vmean:  64.700 cm/s LVOT VTI:    0.149 m  AORTA Ao Root diam: 2.70 cm MITRAL VALVE MV Area (PHT): 3.99 cm              SHUNTS MV PHT:        55.10 msec            Systemic VTI:  0.15 m MV Decel Time: 190 msec              Systemic Diam: 2.00 cm MV E velocity: 119.00 cm/s 103 cm/s MV A velocity: 55.30 cm/s  70.3 cm/s MV E/A ratio:  2.15        1.5  Kirk Ruths MD Electronically signed by Kirk Ruths MD Signature Date/Time: 06/19/2019/3:17:27 PM    Final    CT HEAD CODE STROKE WO CONTRAST  Result Date: 06/18/2019 CLINICAL DATA:  Code stroke. 74 year old male with right side weakness and facial droop. EXAM: CT HEAD WITHOUT CONTRAST TECHNIQUE: Contiguous axial images were obtained from the base of the skull through the vertex without intravenous contrast. COMPARISON:  None. FINDINGS: Brain: Large area of hypodense cytotoxic edema demonstrated from the mesial left temporal lobe through the left occipital pole in keeping with a large left PCA territory infarct. Mild regional mass effect. No associated hemorrhage. Comparatively mild hypodensity in the left thalamus. Superimposed chronic appearing encephalomalacia in the right temporal and occipital lobes. No MCA territory acute cortically based infarct changes identified. No acute intracranial hemorrhage identified. No ventriculomegaly. No midline shift and patent basilar cisterns. Vascular: Calcified  atherosclerosis at the skull base. No suspicious intracranial vascular hyperdensity. Skull: No acute osseous abnormality identified. Sinuses/Orbits: Well pneumatized paranasal sinuses. Tympanic cavities and mastoids  are clear. Other: Visualized orbits and scalp soft tissues are within normal limits. ASPECTS [facility] Stroke Program Early CT Score) Total score (0-10 with 10 being normal): 10, but not applicable in this case given evidence of large acute left PCA infarct. IMPRESSION: 1. Large acute Left PCA territory infarct. No associated hemorrhage. Minor mass effect. 2. No acute anterior circulation infarct is evident (ASPECTS 10). 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 3. These results were communicated to Dr. Lorraine Lax at 2:59 pm on 06/18/2019 by text page via the [facility] messaging system. Electronically Signed   By: Genevie Ann M.D.   On: 06/18/2019 15:02    Cardiac Studies   ECHO:   1. Left ventricular ejection fraction, by visual estimation, is 25 to 30%. The left ventricle has severely decreased function. There is no left ventricular hypertrophy.  2. Elevated left atrial pressure.  3. Left ventricular diastolic parameters are consistent with Grade III diastolic dysfunction (restrictive).  4. Mildly dilated left ventricular internal cavity size.  5. The left ventricle demonstrates regional wall motion abnormalities.  6. Global right ventricle has normal systolic function.The right ventricular size is normal.  7. Left atrial size was normal.  8. Right atrial size was normal.  9. The mitral valve is normal in structure. Mild mitral valve regurgitation. No evidence of mitral stenosis. 10. The tricuspid valve is normal in structure. 11. The aortic valve is tricuspid. Aortic valve regurgitation is not visualized. Mild aortic valve sclerosis without stenosis. 12. The pulmonic valve was not well visualized. Pulmonic valve regurgitation is trivial. 13. The inferior vena cava is dilated in size  with >50% respiratory variability, suggesting right atrial pressure of 8 mmHg. 14. Akinesis of the inferior, inferolateral, apical and distal septal walls; overall severe LV dysfunction; restrictive filling; mild LVE; mild MR.  Patient Profile     74 y.o. male presented with evidence of acute CVA and noted to have acute inferior ST elevation as well.    Assessment & Plan    ACUTE CVA:     Large acute PCA infarct.  Management per neurology.     ACUTE INFERIOR STEMI:   He was started on IV heparin despite the risk.  Also on ASA.  Suggest Plavix when OK with neurology.    Reduced EF as above.  Holding off on titrating meds to avoid hypotension.   Received IV Lasix yesterday and incomplete I/O but oxygenating well on RA.  Dose lasix as needed.  Titrate meds prior to discharge.  I will stop the heparin and start DVT prophylaxis.   ABNORMAL CXR:  On antibiotics.    For questions or updates, please contact [place] Please consult [email or web address] for contact info under Cardiology/STEMI.   Signed, Minus Breeding, MD  06/20/2019, 9:48 AM

## 2019-06-20 NOTE — Progress Notes (Signed)
STROKE TEAM PROGRESS NOTE   INTERVAL HISTORY His daughter and RN are at the bedside. Overnight, RN tried 5 times for placing NG tube without success. Currently he is NPO. Cardiology has discontinued heparin IV and recommend ASA and plavix. Will do ASA suppository at this time.   OBJECTIVE Vitals:   06/20/19 0609 06/20/19 0715 06/20/19 0742 06/20/19 1257  BP:  129/66  99/60  Pulse: 66 77  75  Resp: (!) 22 (!) 22  (!) 23  Temp:   98.1 F (36.7 C) (!) 97.1 F (36.2 C)  TempSrc:   Oral Axillary  SpO2: 97% 96%  97%  Weight:      Height:        CBC:  Recent Labs  Lab 06/18/19 1439 06/18/19 1447 06/20/19 0407 06/20/19 1054  WBC 17.3*   < > 18.4* 16.6*  NEUTROABS 13.8*  --   --   --   HGB 13.9  --  12.6* 12.8*  HCT 42.7  --  39.5 40.3  MCV 97.3   < > 98.3 99.0  PLT 243   < > 266 273   < > = values in this interval not displayed.    Basic Metabolic Panel:  Recent Labs  Lab 06/19/19 0422 06/19/19 1248 06/20/19 0407 06/20/19 1054  NA 139 141 141  --   K 4.0 4.0 4.0  --   CL 107  --  112*  --   CO2 20*  --  18*  --   GLUCOSE 96  --  100*  --   BUN 38*  --  33*  --   CREATININE 1.38*  --  1.37* 1.49*  CALCIUM 8.3*  --  8.0*  --   MG  --   --  2.6*  --     Lipid Panel:     Component Value Date/Time   CHOL 152 06/19/2019 0422   TRIG 107 06/19/2019 0422   HDL 32 (L) 06/19/2019 0422   CHOLHDL 4.8 06/19/2019 0422   VLDL 21 06/19/2019 0422   LDLCALC 99 06/19/2019 0422   HgbA1c:  Lab Results  Component Value Date   HGBA1C 5.3 06/19/2019   Urine Drug Screen:     Component Value Date/Time   LABOPIA NONE DETECTED 06/19/2019 1420   COCAINSCRNUR NONE DETECTED 06/19/2019 1420   LABBENZ NONE DETECTED 06/19/2019 1420   AMPHETMU NONE DETECTED 06/19/2019 1420   THCU NONE DETECTED 06/19/2019 1420   LABBARB NONE DETECTED 06/19/2019 1420    Alcohol Level No results found for: [place] Code Stroke CTA Head W/WO contrast CT Code Stroke CTA Neck W/WO  contrast CT Code Stroke Cerebral Perfusion with contrast 06/18/2019 IMPRESSION:  1. Positive for acute appearing occlusions of the distal Left Vertebral Artery and Left PCA: - distal left V4 occlusion, with upstream multifocal moderate and severe left vertebral stenosis in the neck. - left PCA P1 occlusion just beyond the origin.  2. CTP detects only some of the left PCA territory core infarct which was visible by plain CT.  3. 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 The right vertebral artery and basilar arteries are patent without stenosis.  4. 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  5. Mild to moderate irregularity of the bilateral MCA branches.  6. Small bilateral pleural effusions with increased pulmonary septal  thickening suggestive of pulmonary edema.  7. 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 Recommend follow-up with ENT.   CT HEAD CODE STROKE WO CONTRAST 06/18/2019 IMPRESSION:  1. Large acute Left PCA territory infarct. No associated hemorrhage. Minor mass effect.  2. No acute anterior circulation infarct is evident (ASPECTS 10). 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   MRI Brain WO Contrast 1. Large acute left PCA infarct. 2. Small acute left frontal lobe infarct. 3. 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  XR Chest Single View 06/18/2019 IMPRESSION:  1. Mild cardiomegaly with central vascular congestion and small pleural effusions.  2. Patchy airspace disease in the left infrahilar lung, atelectasis versus pneumonia.   Transthoracic Echocardiogram  06/19/2019 IMPRESSIONS:  1. Left ventricular ejection fraction, by visual estimation, is 25 to 30%. The left ventricle has severely decreased function. There is no left ventricular hypertrophy.  2. Elevated  left atrial pressure.  3. Left ventricular diastolic parameters are consistent with Grade III diastolic dysfunction (restrictive).  4. Mildly dilated left ventricular internal cavity size.  5. The left ventricle demonstrates regional wall motion abnormalities.  6. Global right ventricle has normal systolic function.The right ventricular size is normal.  7. Left atrial size was normal.  8. Right atrial size was normal.  9. The mitral valve is normal in structure. Mild mitral valve regurgitation. No evidence of mitral stenosis. 10. The tricuspid valve is normal in structure. 11. The aortic valve is tricuspid. Aortic valve regurgitation is not visualized. Mild aortic valve sclerosis without stenosis. 12. The pulmonic valve was not well visualized. Pulmonic valve regurgitation is trivial. 13. The inferior vena cava is dilated in size with >50% respiratory variability, suggesting right atrial pressure of 8 mmHg. 14. Akinesis of the inferior, inferolateral, apical and distal septal walls; overall severe LV dysfunction; restrictive filling; mild LVE; mild MR   PHYSICAL EXAM  Temp:  [97.1 F (36.2 C)-98.3 F (36.8 C)] 97.1 F (36.2 C) (01/10 1257) Pulse Rate:  [65-91] 75 (01/10 1257) Resp:  [20-26] 23 (01/10 1257) BP: (99-131)/(60-76) 99/60 (01/10 1257) SpO2:  [96 %-100 %] 97 % (01/10 1257) Weight:  [104 kg] 104 kg (01/10 0323)  General - Well nourished, well developed, lethargic.  Ophthalmologic - fundi not visualized due to noncooperation.  Cardiovascular - Regular rhythm and rate.  Neuro - lethargic but more awake than yesterday, open eyes with voice.  Able to follow simple commands on the left but perseverated near end of exam, severe dysarthria, intelligible words.  PERRL, more left gaze preference, right hemianopia, not blinking to visual threat on the right, but able to blink to visual threat on the left.  Right facial droop, tongue midline.  Right upper and lower extremity flaccid,  only slight withdrawal on the right lower extremity.  Left upper extremity at least 4/5, follow commands.  Left lower extremity 3/5.  DTR 1+, no Babinski.  Sensation and coordination not corporative.  Gait not tested.   ASSESSMENT/PLAN [name] is a 74 y.o. male with no known significant past medical history presenting as a code stroke with c/o slurred speech, right arm flaccid. Cardiology consult for STEMI.  He did not receive IV t-PA due to late presentation (>4.5 hours from time of onset).  Stroke: Large left PCA territory acute infarct - embolic patter - unknown source, could be related to STEMI or large vessel atherosclerosis.  Resultant right hemianopia, right hemiplegia, right facial droop, severe dysarthria  Code Stroke CT Head -  Large acute Left PCA territory infarct. No associated hemorrhage. Minor mass effect. No acute anterior circulation infarct is evident (ASPECTS 10). 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   MRI head - large acute left PCA infarct, small acute left frontal lobe infarct, chronic right temporal and right occipital lobe infarcts  CTA H&N - acute occlusions of the distal Left V4 and Left P1. Chronic occlusion of the right PCA. 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   2D Echo - EF 25 - 30%. Overall severe LV dysfunction.   Sars Corona Virus 2 - negative  LDL - 99  HgbA1c 5.3  UDS - negative  VTE prophylaxis - Heparin subq  No antithrombotic prior to admission, was on aspirin 81 mg daily and heparin IV. Now on ASA PR. Will repeat TTE in one week, if EF still < 30-35%, will recommend anticoagulation at that time.   Patient counseled to be compliant with his antithrombotic medications  Ongoing aggressive stroke risk factor management  Therapy recommendations:  pending  Disposition:  Pending  STEMI -  inferior wall  Troponin -12,799-> 12,437-> 10,883-> 8791  EKG - II, III, aVF STT elevation  EF 25 - 30%. Overall severe LV dysfunction.  Cardiology on board  On aspirin PR  Heparin IV was discontinued by cardiology  Will repeat TTE in one week, if EF still < 30-35%, will recommend anticoagulation at that time.  EKG concerning for inferior leads MI  Acute pulmonary edema  CXR - Small bilateral pleural effusions with increased pulmonary septal thickening suggestive of pulmonary edema.   Intermittent wheezing 06/19/19  Off IVF  S/p lasix  Aspiration pneumonia versus pneumonitis  Severe dysarthria and difficulty handling oral secretions  Intermittent wheezing  Leukocytosis -WBC 17.3 ->15.7->16.6  On Rocephin and azithromycin (started 06/19/19)  Treatment per primary team  BP measurement  Home BP meds: none   Current BP meds: none   Stable on the low end  Cardiology holding off BB, CCB now . Permissive hypertension (OK if <180/105) but gradually normalize in 3-5 days  . 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 Recommend follow-up with ENT.   Small bilateral pleural effusions with increased pulmonary septal thickening suggestive of pulmonary edema.   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 This patient's care requiresreview of multiple databases, neurological assessment, discussion with family, other specialists and medical decision making of high complexity. I had long discussion with daughter at bedside, updated pt current condition,  treatment plan and potential prognosis, and answered all the questions.  Daughter expressed understanding and appreciation.  I also discussed with Dr. Marthenia Rolling.  Rosalin Hawking, MD PhD Stroke Neurology 06/20/2019 4:31 PM   To contact Stroke Continuity provider, please refer to [email or web address] After hours, contact General Neurology

## 2019-06-20 NOTE — Progress Notes (Addendum)
0008 Notified by Central Telemetry that pt has ST elevation, PVC's and BI and TRIgeminy. Notified Dr Silas [place] with new orders given to obtain 12-lead EKG and am labs. Same to be done.  0200 Pt had since been asleep and unresponsive and now is beginning to stir and open eyes for short periods of time, pt has unintelligent speech, talked to pt to orient him to place and situation, pt visibly calmed down and dozed back off to sleep, will cont to monitor.  0430 Pt was turning in bed and condom cath became dislodged, complete bed bath given and new condom cath was placed.

## 2019-06-20 NOTE — Progress Notes (Signed)
Very unclear verbiage, however pt stated I think I want to use the bathroom. Verified with pt-do you want to use the bedpan/do you have to make a bowel movement. Pt responded-I think so.

## 2019-06-20 NOTE — Progress Notes (Signed)
ANTICOAGULATION CONSULT NOTE - Follow Up Consult  Pharmacy Consult for heparin Indication: MI in setting of CVA  Labs: Recent Labs    06/18/19 1439 06/18/19 1439 06/18/19 1447 06/18/19 1531 06/18/19 1619 06/18/19 1828 06/18/19 2212 06/19/19 0000 06/19/19 0422 06/19/19 1248 06/19/19 1515 06/20/19 0407  HGB 13.9  --  14.6   < > 15.6  --   --   --  13.6 13.3  --   --   HCT 42.7  --  43.0  --  46.0  --   --   --  43.7 39.0  --   --   PLT 243  --   --   --   --   --   --   --  239  --   --   --   APTT 26  --   --   --   --   --   --   --   --   --   --   --   LABPROT 13.9  --   --   --   --   --   --   --   --   --   --   --   INR 1.1  --   --   --   --   --   --   --   --   --   --   --   HEPARINUNFRC  --   --   --   --   --   --   --   --  0.10*  --  <0.10* 0.56  CREATININE 1.66*  --  1.50*  --   --   --   --   --  1.38*  --   --   --   TROPONINIHS  --    < >  --   --   --  12,437DS:[id number]* 8,791*  --   --   --   --    < > = values in this interval not displayed.    Assessment: 74yo male now slightly supratherapeutic on heparin after rate changes; no gtt issues or signs of bleeding per RN.  Goal of Therapy:  Heparin level 0.3-0.5 units/ml   Plan:  Will decrease heparin gtt by 1 unit/kgABW/hr to 1600 units/hr and check level in 6 hours.    Wynona Neat, PharmD, BCPS  06/20/2019,5:25 AM

## 2019-06-20 NOTE — H&P (Signed)
PROGRESS NOTE    Levi Pruitt  [id number] DOB: April 10, 1946 DOA: 06/18/2019 PCP: Patient, No Pcp Per  Outpatient Specialists:   Brief Narrative:  Patient is a 74 year old Caucasian male, obese, with no other documented past medical history. Patient is not able to provide and history.  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  Apparently, patient was found down at home.  CXR revealed "Mild cardiomegaly with central vascular congestion and small pleural effusions.  2. Patchy airspace disease in the left infrahilar lung, atelectasis versus pneumonia".  Troponin was greater than 12,000 on presentation, but on the downward trend.  Cardiology if following the patient, and recommended heparin drip.  Patient remains lethargic.  Nursing staff seemed worried about possible worsening neuro symptoms.  Discussed with Neuro team and critical team (to assess if patient is ICU appropriate).  Also discussed code status with patient's daughter, but patient's girlfriend is patient's power of attorney.  06/19/2019:  Patient seen alongside patient's Nurse and daughter.  No history from the patient.  Imaging studies reviewed.  Patient is currently on Aspirin and Heparin drip.  Patient actually moves Left upper extremity quite well and left lower extremity to some extent.  Work up for and management of pneumonia is in progress.  AKI is improving.  Wheezing noted.  06/20/2019: Patient seen alongside patient's daughter and nurse.  Patient is a lot better today.  Patient is more communicative today, though, with dysarthria.  No wheezing noted today.  Patient is able to follow commands today.  Patient moves left side of the body.  We discontinue around-the-clock nebs DuoNeb.  We will continue as needed nebs DuoNeb.  Cardiology input is  appreciated, heparin drip has been discontinued.  Patient is done on subcutaneous heparin for DVT prophylaxis.  Patient is also on aspirin.  Neurology team is directing acute CVA management.  Cortrak tube could not be placed yesterday.  Hopefully, interventional radiology team will assist in placement of cortrak tube tomorrow.   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  -CT angio head and neck noted below noted -Patient is on aspirin. -Heparin drip has been discontinued.  Patient is currently on subcu Toprol for DVT prophylaxis. -Neurology is directing care -Place Cortrack tube when possible -Tube feeding with free water -Consult speech team. -Patient is more communicative today.  Patient moves left side of the body.  (CT angio head and neck revealed: 1. Positive for acute appearing occlusions of the distal Left Vertebral Artery and Left PCA: - distal left V4 occlusion, with upstream multifocal moderate and severe left vertebral stenosis in the neck. - left PCA P1 occlusion just beyond the origin. 2. CTP detects only some of the left PCA territory core infarct which was visible by plain CT. 3. 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 The right vertebral artery and basilar arteries are patent without stenosis. 4. 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 5. Mild to moderate irregularity of the bilateral MCA branches. 6. Small bilateral pleural effusions with increased pulmonary septal thickening suggestive of pulmonary edema. 7.  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 Recommend follow-up with ENT).   Possible pneumonia: -Patient is at  risk for aspiration pneumonia -Continue IV ceftriaxone and azithromycin -Follow sputum culture -Procalcitonin yesterday was 0.3. -Complete course of antibiotics.  Acute on chronic combined systolic and diastolic CHF: -Echo just reported. -Echo revealed EF of 25 to 30% and grade 3 diastolic dysfunction (restrictive). -Continue to hold IV fluids.   -IV Lasix as needed.   -Cardiac BNP done on 06/19/2019 was 756.3.  Obesity: BMI of 31.68 kg/m reported Further management on outpatient basis  Acute kidney injury versus acute on chronic kidney disease: -Serum creatinine today is 1.37. -Continue to monitor closely. -UA, urine sodium, urine protein and creatinine noted. -Low threshold for further work-up, as well as, renal ultrasound  Hyperlipidemia: LDL is 99. Goal LDL should be less than 70. Statin (Lipitor 80 mg p.o. once daily)  Inferior wall STEMI:  -Initial high-sensitivity troponin was greater than 12,000, but has been on the downward trend. -Echo revealed cardiomyopathy, with EF of 25% -Heparin drip has been discontinued.   -Cardiology is directing care.   -Therapy limited due to the patient's clinical situation.  Goal is to keep blood pressure elevated due to acute CVA. -Cardiology team is managing.  Input is highly appreciated.  Patient is also on aspirin.  Guarded prognosis  DVT prophylaxis: Heparin drip Code Status: Full code Family Communication: Daughter Disposition Plan: This will depend on hospital course.   Consultants:   Neurology  Cardiology  We will have a low threshold to consult palliative care team.  Procedures:   Echocardiogram.    Antimicrobials:   None   Subjective: Patient has no new complaints. Lethargy has resolved. Patient looks better today.  Objective: Vitals:   06/20/19 0609 06/20/19 0715 06/20/19 0742 06/20/19 1257  BP:  129/66  99/60  Pulse: 66 77  75  Resp: (!) 22 (!) 22  (!) 23  Temp:   98.1 F (36.7 C) (!) 97.1 F  (36.2 C)  TempSrc:   Oral Axillary  SpO2: 97% 96%  97%  Weight:      Height:        Intake/Output Summary (Last 24 hours) at 06/20/2019 1413 Last data filed at 06/20/2019 0500 Gross per 24 hour  Intake 740.5 ml  Output --  Net 740.5 ml   Filed Weights   06/19/19 0624 06/20/19 0323  Weight: 97.3 kg 104 kg    Examination:  General exam: Awake and alert.  Patient is not in any distress.  No wheezing.     Respiratory system: Clear to auscultation. Cardiovascular system: S1 & S2 heard Gastrointestinal system: Abdomen is obese, soft and nontender.  Organs are difficult to assess.   Central nervous system: Awake and alert, facial asymmetry.  Right-sided hemiplegia.  Patient is able to move left side.    Data Reviewed: I have personally reviewed following labs and imaging studies  CBC: Recent Labs  Lab 06/18/19 1439 06/18/19 1619 06/19/19 0422 06/19/19 1248 06/20/19 0407 06/20/19 1054  WBC 17.3*  --  15.7*  --  18.4* 16.6*  NEUTROABS 13.8*  --   --   --   --   --   HGB 13.9 15.6 13.6 13.3 12.6* 12.8*  HCT 42.7 46.0 43.7 39.0 39.5 40.3  MCV 97.3  --  100.9*  --  98.3 99.0  PLT 243  --  239  --  266 [id number]   Basic Metabolic Panel: Recent Labs  Lab 06/18/19  1439 06/18/19 1447 06/18/19 1619 06/19/19 0422 06/19/19 1248 06/20/19 0407 06/20/19 1054  NA 138 137 137 139 141 141  --   K 3.7 3.6 4.1 4.0 4.0 4.0  --   CL 105 104  --  107  --  112*  --   CO2 20*  --   --  20*  --  18*  --   GLUCOSE 102* 95  --  96  --  100*  --   BUN 43* 41*  --  38*  --  33*  --   CREATININE 1.66* 1.50*  --  1.38*  --  1.37* 1.49*  CALCIUM 8.5*  --   --  8.3*  --  8.0*  --   MG  --   --   --   --   --  2.6*  --    GFR: Estimated Creatinine Clearance: 52.5 mL/min (A) (by C-G formula based on SCr of 1.49 mg/dL (H)). Liver Function Tests: Recent Labs  Lab 06/18/19 1439 06/19/19 0422  AST 73* 63*  ALT 60* 59*  ALKPHOS 69 68  BILITOT 0.9 1.1  PROT 6.7 6.6  ALBUMIN 2.9* 2.7*   No  results for input(s): LIPASE, AMYLASE in the last 168 hours. Recent Labs  Lab 06/19/19 1515  AMMONIA 12   Coagulation Profile: Recent Labs  Lab 06/18/19 1439  INR 1.1   Cardiac Enzymes: No results for input(s): CKTOTAL, CKMB, CKMBINDEX, TROPONINI in the last 168 hours. BNP (last 3 results) No results for input(s): PROBNP in the last 8760 hours. HbA1C: Recent Labs    06/19/19 0422  HGBA1C 5.3   CBG: Recent Labs  Lab 06/19/19 2002 06/19/19 2349 06/20/19 0317 06/20/19 0739 06/20/19 1254  GLUCAP 85 87 89 94 92   Lipid Profile: Recent Labs    06/19/19 0422  CHOL 152  HDL 32*  LDLCALC 99  TRIG 107  CHOLHDL 4.8   Thyroid Function Tests: No results for input(s): TSH, T4TOTAL, FREET4, T3FREE, THYROIDAB in the last 72 hours. Anemia Panel: No results for input(s): VITAMINB12, FOLATE, FERRITIN, TIBC, IRON, RETICCTPCT in the last 72 hours. Urine analysis:    Component Value Date/Time   COLORURINE YELLOW 06/19/2019 2320   APPEARANCEUR HAZY (A) 06/19/2019 2320   LABSPEC 1.029 06/19/2019 2320   PHURINE 5.0 06/19/2019 2320   GLUCOSEU NEGATIVE 06/19/2019 2320   HGBUR NEGATIVE 06/19/2019 2320   BILIRUBINUR NEGATIVE 06/19/2019 2320   KETONESUR 20 (A) 06/19/2019 2320   PROTEINUR 30 (A) 06/19/2019 2320   NITRITE NEGATIVE 06/19/2019 2320   LEUKOCYTESUR NEGATIVE 06/19/2019 2320   Sepsis Labs: @LABRCNTIP (procalcitonin:4,lacticidven:4)  ) Recent Results (from the past 240 hour(s))  Respiratory Panel by RT PCR (Flu A&B, Covid) - Nasopharyngeal Swab     Status: None   Collection Time: 06/18/19  5:51 PM   Specimen: Nasopharyngeal Swab  Result Value Ref Range Status   SARS Coronavirus 2 by RT PCR NEGATIVE NEGATIVE Final    Comment: (NOTE) SARS-CoV-2 target nucleic acids are NOT DETECTED. The SARS-CoV-2 RNA is generally detectable in upper respiratoy specimens during the acute phase of infection. The lowest concentration of SARS-CoV-2 viral copies this assay can detect  is 131 copies/mL. A negative result does not preclude SARS-Cov-2 infection and should not be used as the sole basis for treatment or other patient management decisions. A negative result may occur with  improper specimen collection/handling, submission of specimen other than nasopharyngeal swab, presence of viral mutation(s) within the areas targeted by this assay,  and inadequate number of viral copies (<131 copies/mL). A negative result must be combined with clinical observations, patient history, and epidemiological information. The expected result is Negative. Fact Sheet for Patients:  PinkCheek.be Fact Sheet for Healthcare Providers:  GravelBags.it This test is not yet ap proved or cleared by the [place] FDA and  has been authorized for detection and/or diagnosis of SARS-CoV-2 by FDA under an Emergency Use Authorization (EUA). This EUA will remain  in effect (meaning this test can be used) for the duration of the COVID-19 declaration under Section 564(b)(1) of the Act, 21 U.S.C. section 360bbb-3(b)(1), unless the authorization is terminated or revoked sooner.    Influenza A by PCR NEGATIVE NEGATIVE Final   Influenza B by PCR NEGATIVE NEGATIVE Final    Comment: (NOTE) The Xpert Xpress SARS-CoV-2/FLU/RSV assay is intended as an aid in  the diagnosis of influenza from Nasopharyngeal swab specimens and  should not be used as a sole basis for treatment. Nasal washings and  aspirates are unacceptable for Xpert Xpress SARS-CoV-2/FLU/RSV  testing. Fact Sheet for Patients: PinkCheek.be Fact Sheet for Healthcare Providers: GravelBags.it This test is not yet approved or cleared by the [place] FDA and  has been authorized for detection and/or diagnosis of SARS-CoV-2 by  FDA under an Emergency Use Authorization (EUA). This EUA will remain  in effect (meaning this  test can be used) for the duration of the  Covid-19 declaration under Section 564(b)(1) of the Act, 21  U.S.C. section 360bbb-3(b)(1), unless the authorization is  terminated or revoked. Performed at [facility], [place] [address], [place], [place] [address]   MRSA PCR Screening     Status: None   Collection Time: 06/19/19  7:29 PM   Specimen: Nasal Mucosa; Nasopharyngeal  Result Value Ref Range Status   MRSA by PCR NEGATIVE NEGATIVE Final    Comment:        The GeneXpert MRSA Assay (FDA approved for NASAL specimens only), is one component of a comprehensive MRSA colonization surveillance program. It is not intended to diagnose MRSA infection nor to guide or monitor treatment for MRSA infections. Performed at [facility], [place] [address] [place], [place] [address]          Radiology Studies: CT Code Stroke CTA Head W/WO contrast  Result Date: 06/18/2019 CLINICAL DATA:  74 year old male code stroke presentation with right side weakness. EXAM: CT ANGIOGRAPHY HEAD AND NECK CT PERFUSION BRAIN TECHNIQUE: Multidetector CT imaging of the head and neck was performed using the standard protocol during bolus administration of intravenous contrast. Multiplanar CT image reconstructions and MIPs were obtained to evaluate the vascular anatomy. Carotid stenosis measurements (when applicable) are obtained utilizing NASCET criteria, using the distal internal carotid diameter as the denominator. Multiphase CT imaging of the brain was performed following IV bolus contrast injection. Subsequent parametric perfusion maps were calculated using RAPID software. CONTRAST:  100 milliliters Omnipaque 350 COMPARISON:  Plain head CT 1448 hours today. FINDINGS: CT Brain Perfusion Findings: ASPECTS: 10, although a large left PCA territory infarct was suspected by plain CT. CBF (<30%) Volume: 51mL, corresponding to a portion of the left PCA territory in the occipital lobe Perfusion (Tmax>6.0s) volume:  49mL, although some of this is likely artifactual involving the right temporal lobe portion of which is chronically abnormal. Mismatch Volume: 78mL Infarction Location:Left PCA CTA NECK Skeleton: Carious dentition. No acute osseous abnormality identified. Upper chest: Small bilateral layering pleural effusions. Septal thickening and mild additional peribronchial opacity near  the hila in the upper lobes. No superior mediastinal lymphadenopathy. Other neck: Retropharyngeal course of both carotid arteries. Small round 8 millimeter intermediate density soft tissue nodule in the superficial lobe of the left parotid gland. No cervical lymphadenopathy or other neck mass. Aortic arch: Mild Calcified aortic atherosclerosis. 3 vessel arch configuration. Right carotid system: Mild motion artifact at the right CCA origin. Circumferential soft plaque in the right CCA proximal to the bifurcation as it has a retropharyngeal course on series 3, image 125, but less than 50 % stenosis with respect to the distal vessel. Superimposed calcified plaque at the right carotid bifurcation, and bulky additional soft plaque in the right ICA bulb resulting in a short segment high-grade stenosis approaching a radiographic string sign on series 3, image 94. The right ICA remains patent to the skull base. Left carotid system: Normal left CCA origin. Circumferential soft plaque in the left CCA similar to that on the right without stenosis. Soft and calcified plaque at the left ICA origin and bulb resulting in up to 60 % stenosis with respect to the distal vessel. Left ICA remains patent to the skull base. Vertebral arteries: Mild if any proximal right subclavian artery plaque with no stenosis there or at the right vertebral artery origin. The right vertebral appears dominant and is patent to the skull base without stenosis. Soft and calcified plaque in the proximal left subclavian artery not resulting in significant subclavian artery stenosis, but  there is moderate to severe stenosis at the left vertebral artery origin and V1 segment due to mostly soft plaque (series 6, image 154). The distal left V1 segment is also diminutive and stenotic (image 151). The left V2 segment is intermittently diminutive and stenotic, but the vessel remains patent to the skull base. CTA HEAD Posterior circulation: The right vertebral artery is patent to the vertebrobasilar junction without stenosis. The right PICA origin remains patent. The left V4 segment is occluded distal to the patent left PICA but proximal to the vertebrobasilar junction on series 5, image 132. The basilar artery remains patent to the SCA origins. The left PCA is occluded just beyond its origin (series 9, image 26). No reconstitution. And the right PCA is functionally occluded at its origin with only minimal irregular right PCA branch enhancement. Anterior circulation: Both ICA siphons are patent. On the left there is moderate to severe circumferential calcified plaque in the cavernous segment resulting in moderate to severe stenosis proximal to the anterior genu (series 5, image 102). Moderate additional left supraclinoid segment stenosis. On the right there is similar advanced siphon atherosclerosis with moderate right cavernous and moderate to severe right supraclinoid segment stenosis. Patent right ICA terminus. There is a patent small left posterior communicating artery. Patent carotid termini. Patent MCA and ACA origins. Left A1 is mildly dominant. Anterior communicating artery is patent with a median artery of the corpus callosum. Bilateral ACA branches are patent with mild irregularity. Left MCA M1 segment and left MCA bifurcation are patent without stenosis. Left MCA M3 branches are mildly to moderately irregular. Right MCA M1 segment and bifurcation are patent without stenosis. Right MCA M3 branches are also mildly to moderately irregular. Venous sinuses: Early contrast timing, grossly patent  assuming dominant right side transverse and sigmoid sinuses. Anatomic variants: Median artery of the corpus callosum. Review of the MIP images confirms the above findings IMPRESSION: 1. Positive for acute appearing occlusions of the distal Left Vertebral Artery and Left PCA: - distal left V4 occlusion, with upstream multifocal  moderate and severe left vertebral stenosis in the neck. - left PCA P1 occlusion just beyond the origin. 2. CTP detects only some of the left PCA territory core infarct which was visible by plain CT. 3. 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 The right vertebral artery and basilar arteries are patent without stenosis. 4. 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 5. Mild to moderate irregularity of the bilateral MCA branches. 6. Small bilateral pleural effusions with increased pulmonary septal thickening suggestive of pulmonary edema. 7. 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 Recommend follow-up with ENT. Preliminary results of the arterial findings were text paged to Dr. Lorraine Lax via Shea Evans at 1503 hours, and again at 1518 hours. Electronically Signed   By: Genevie Ann M.D.   On: 06/18/2019 15:26   CT Code Stroke CTA Neck W/WO contrast  Result Date: 06/18/2019 CLINICAL DATA:  74 year old male code stroke presentation with right side weakness. EXAM: CT ANGIOGRAPHY HEAD AND NECK CT PERFUSION BRAIN TECHNIQUE: Multidetector CT imaging of the head and neck was performed using the standard protocol during bolus administration of intravenous contrast. Multiplanar CT image reconstructions and MIPs were obtained to evaluate the vascular anatomy. Carotid stenosis measurements (when applicable) are obtained utilizing NASCET criteria, using the distal internal carotid diameter as the denominator. Multiphase CT imaging  of the brain was performed following IV bolus contrast injection. Subsequent parametric perfusion maps were calculated using RAPID software. CONTRAST:  100 milliliters Omnipaque 350 COMPARISON:  Plain head CT 1448 hours today. FINDINGS: CT Brain Perfusion Findings: ASPECTS: 10, although a large left PCA territory infarct was suspected by plain CT. CBF (<30%) Volume: 40mL, corresponding to a portion of the left PCA territory in the occipital lobe Perfusion (Tmax>6.0s) volume: 14mL, although some of this is likely artifactual involving the right temporal lobe portion of which is chronically abnormal. Mismatch Volume: 11mL Infarction Location:Left PCA CTA NECK Skeleton: Carious dentition. No acute osseous abnormality identified. Upper chest: Small bilateral layering pleural effusions. Septal thickening and mild additional peribronchial opacity near the hila in the upper lobes. No superior mediastinal lymphadenopathy. Other neck: Retropharyngeal course of both carotid arteries. Small round 8 millimeter intermediate density soft tissue nodule in the superficial lobe of the left parotid gland. No cervical lymphadenopathy or other neck mass. Aortic arch: Mild Calcified aortic atherosclerosis. 3 vessel arch configuration. Right carotid system: Mild motion artifact at the right CCA origin. Circumferential soft plaque in the right CCA proximal to the bifurcation as it has a retropharyngeal course on series 3, image 125, but less than 50 % stenosis with respect to the distal vessel. Superimposed calcified plaque at the right carotid bifurcation, and bulky additional soft plaque in the right ICA bulb resulting in a short segment high-grade stenosis approaching a radiographic string sign on series 3, image 94. The right ICA remains patent to the skull base. Left carotid system: Normal left CCA origin. Circumferential soft plaque in the left CCA similar to that on the right without stenosis. Soft and calcified plaque at the left  ICA origin and bulb resulting in up to 60 % stenosis with respect to the distal vessel. Left ICA remains patent to the skull base. Vertebral arteries: Mild if any proximal right subclavian artery plaque with no stenosis there or at the right vertebral artery origin. The right vertebral appears dominant and  is patent to the skull base without stenosis. Soft and calcified plaque in the proximal left subclavian artery not resulting in significant subclavian artery stenosis, but there is moderate to severe stenosis at the left vertebral artery origin and V1 segment due to mostly soft plaque (series 6, image 154). The distal left V1 segment is also diminutive and stenotic (image 151). The left V2 segment is intermittently diminutive and stenotic, but the vessel remains patent to the skull base. CTA HEAD Posterior circulation: The right vertebral artery is patent to the vertebrobasilar junction without stenosis. The right PICA origin remains patent. The left V4 segment is occluded distal to the patent left PICA but proximal to the vertebrobasilar junction on series 5, image 132. The basilar artery remains patent to the SCA origins. The left PCA is occluded just beyond its origin (series 9, image 26). No reconstitution. And the right PCA is functionally occluded at its origin with only minimal irregular right PCA branch enhancement. Anterior circulation: Both ICA siphons are patent. On the left there is moderate to severe circumferential calcified plaque in the cavernous segment resulting in moderate to severe stenosis proximal to the anterior genu (series 5, image 102). Moderate additional left supraclinoid segment stenosis. On the right there is similar advanced siphon atherosclerosis with moderate right cavernous and moderate to severe right supraclinoid segment stenosis. Patent right ICA terminus. There is a patent small left posterior communicating artery. Patent carotid termini. Patent MCA and ACA origins. Left A1 is  mildly dominant. Anterior communicating artery is patent with a median artery of the corpus callosum. Bilateral ACA branches are patent with mild irregularity. Left MCA M1 segment and left MCA bifurcation are patent without stenosis. Left MCA M3 branches are mildly to moderately irregular. Right MCA M1 segment and bifurcation are patent without stenosis. Right MCA M3 branches are also mildly to moderately irregular. Venous sinuses: Early contrast timing, grossly patent assuming dominant right side transverse and sigmoid sinuses. Anatomic variants: Median artery of the corpus callosum. Review of the MIP images confirms the above findings IMPRESSION: 1. Positive for acute appearing occlusions of the distal Left Vertebral Artery and Left PCA: - distal left V4 occlusion, with upstream multifocal moderate and severe left vertebral stenosis in the neck. - left PCA P1 occlusion just beyond the origin. 2. CTP detects only some of the left PCA territory core infarct which was visible by plain CT. 3. 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 The right vertebral artery and basilar arteries are patent without stenosis. 4. 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 5. Mild to moderate irregularity of the bilateral MCA branches. 6. Small bilateral pleural effusions with increased pulmonary septal thickening suggestive of pulmonary edema. 7. 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 Recommend follow-up with ENT. Preliminary results of the arterial findings were text paged to Dr. Lorraine Lax via Shea Evans at 1503 hours, and again at 1518 hours. Electronically Signed   By: Genevie Ann M.D.   On: 06/18/2019 15:26   MR BRAIN WO CONTRAST  Result Date: 06/19/2019 CLINICAL DATA:  Stroke follow-up. EXAM: MRI HEAD WITHOUT CONTRAST TECHNIQUE: Multiplanar, multiecho  pulse sequences of the brain and surrounding structures were obtained without intravenous contrast. COMPARISON:  Head CT, CTA, and CTP 06/18/2019 FINDINGS: Brain: As seen on CT, there is a large acute left PCA infarct involving the  medial aspects of the left temporal and occipital lobes. The left thalamus, posterior limb of the left internal capsule, left splenium of the corpus callosum, left mamillary body, and left cerebral peduncle are also involved. There is also a 7 mm acute cortical infarct in the posterior left frontal lobe which involves the anterior aspect of the precentral gyrus. There is no hemorrhage associated with these acute infarcts. Chronic infarcts are again noted in the right temporal and right occipital lobes. There is a single punctate focus of susceptibility artifact in the right cerebellum suggesting a chronic microhemorrhage. Small chronic infarcts are noted in the cerebellum bilaterally. Scattered small foci of T2 hyperintensity in the cerebral white matter bilaterally are nonspecific but compatible with minimal chronic small vessel ischemic disease. There is no mass, midline shift, or extra-axial fluid collection. There is mild global cerebral atrophy. Vascular: Abnormal appearance of the distal left vertebral artery corresponding to occlusion on CT. Skull and upper cervical spine: No suspicious marrow lesion. Sinuses/Orbits: Unremarkable orbits. Paranasal sinuses and mastoid air cells are clear. Other: None. IMPRESSION: 1. Large acute left PCA infarct. 2. Small acute left frontal lobe infarct. 3. 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 Electronically Signed   By: Logan Bores M.D.   On: 06/19/2019 11:27   CT Code Stroke Cerebral Perfusion with contrast  Result Date: 06/18/2019 CLINICAL DATA:  74 year old male code stroke presentation with right side weakness. EXAM: CT ANGIOGRAPHY HEAD AND NECK CT PERFUSION BRAIN TECHNIQUE: Multidetector CT imaging of the head and neck  was performed using the standard protocol during bolus administration of intravenous contrast. Multiplanar CT image reconstructions and MIPs were obtained to evaluate the vascular anatomy. Carotid stenosis measurements (when applicable) are obtained utilizing NASCET criteria, using the distal internal carotid diameter as the denominator. Multiphase CT imaging of the brain was performed following IV bolus contrast injection. Subsequent parametric perfusion maps were calculated using RAPID software. CONTRAST:  100 milliliters Omnipaque 350 COMPARISON:  Plain head CT 1448 hours today. FINDINGS: CT Brain Perfusion Findings: ASPECTS: 10, although a large left PCA territory infarct was suspected by plain CT. CBF (<30%) Volume: 67mL, corresponding to a portion of the left PCA territory in the occipital lobe Perfusion (Tmax>6.0s) volume: 49mL, although some of this is likely artifactual involving the right temporal lobe portion of which is chronically abnormal. Mismatch Volume: 58mL Infarction Location:Left PCA CTA NECK Skeleton: Carious dentition. No acute osseous abnormality identified. Upper chest: Small bilateral layering pleural effusions. Septal thickening and mild additional peribronchial opacity near the hila in the upper lobes. No superior mediastinal lymphadenopathy. Other neck: Retropharyngeal course of both carotid arteries. Small round 8 millimeter intermediate density soft tissue nodule in the superficial lobe of the left parotid gland. No cervical lymphadenopathy or other neck mass. Aortic arch: Mild Calcified aortic atherosclerosis. 3 vessel arch configuration. Right carotid system: Mild motion artifact at the right CCA origin. Circumferential soft plaque in the right CCA proximal to the bifurcation as it has a retropharyngeal course on series 3, image 125, but less than 50 % stenosis with respect to the distal vessel. Superimposed calcified plaque at the right carotid bifurcation, and bulky additional soft  plaque in the right ICA bulb resulting in a short segment high-grade stenosis approaching a radiographic string sign on series 3, image 94. The right ICA remains patent to the skull base. Left carotid system: Normal left CCA origin. Circumferential soft plaque in the left CCA similar to that on the right without stenosis. Soft and calcified  plaque at the left ICA origin and bulb resulting in up to 60 % stenosis with respect to the distal vessel. Left ICA remains patent to the skull base. Vertebral arteries: Mild if any proximal right subclavian artery plaque with no stenosis there or at the right vertebral artery origin. The right vertebral appears dominant and is patent to the skull base without stenosis. Soft and calcified plaque in the proximal left subclavian artery not resulting in significant subclavian artery stenosis, but there is moderate to severe stenosis at the left vertebral artery origin and V1 segment due to mostly soft plaque (series 6, image 154). The distal left V1 segment is also diminutive and stenotic (image 151). The left V2 segment is intermittently diminutive and stenotic, but the vessel remains patent to the skull base. CTA HEAD Posterior circulation: The right vertebral artery is patent to the vertebrobasilar junction without stenosis. The right PICA origin remains patent. The left V4 segment is occluded distal to the patent left PICA but proximal to the vertebrobasilar junction on series 5, image 132. The basilar artery remains patent to the SCA origins. The left PCA is occluded just beyond its origin (series 9, image 26). No reconstitution. And the right PCA is functionally occluded at its origin with only minimal irregular right PCA branch enhancement. Anterior circulation: Both ICA siphons are patent. On the left there is moderate to severe circumferential calcified plaque in the cavernous segment resulting in moderate to severe stenosis proximal to the anterior genu (series 5, image  102). Moderate additional left supraclinoid segment stenosis. On the right there is similar advanced siphon atherosclerosis with moderate right cavernous and moderate to severe right supraclinoid segment stenosis. Patent right ICA terminus. There is a patent small left posterior communicating artery. Patent carotid termini. Patent MCA and ACA origins. Left A1 is mildly dominant. Anterior communicating artery is patent with a median artery of the corpus callosum. Bilateral ACA branches are patent with mild irregularity. Left MCA M1 segment and left MCA bifurcation are patent without stenosis. Left MCA M3 branches are mildly to moderately irregular. Right MCA M1 segment and bifurcation are patent without stenosis. Right MCA M3 branches are also mildly to moderately irregular. Venous sinuses: Early contrast timing, grossly patent assuming dominant right side transverse and sigmoid sinuses. Anatomic variants: Median artery of the corpus callosum. Review of the MIP images confirms the above findings IMPRESSION: 1. Positive for acute appearing occlusions of the distal Left Vertebral Artery and Left PCA: - distal left V4 occlusion, with upstream multifocal moderate and severe left vertebral stenosis in the neck. - left PCA P1 occlusion just beyond the origin. 2. CTP detects only some of the left PCA territory core infarct which was visible by plain CT. 3. 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 The right vertebral artery and basilar arteries are patent without stenosis. 4. 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 5. Mild to moderate irregularity of the bilateral MCA branches. 6. Small bilateral pleural effusions with increased pulmonary septal thickening suggestive of pulmonary edema. 7. 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  Recommend follow-up with ENT. Preliminary results of the arterial findings were text paged to Dr. Lorraine Lax via Shea Evans at 1503 hours, and again at 1518 hours. Electronically Signed   By: Genevie Ann M.D.   On: 06/18/2019 15:26   DG CHEST  PORT 1 VIEW  Result Date: 06/19/2019 CLINICAL DATA:  Acute left PCA infarct EXAM: PORTABLE CHEST 1 VIEW COMPARISON:  06/18/2019 FINDINGS: Stable cardiomegaly with central vascular congestion. Left basilar airspace opacity/consolidation obscures the left hemidiaphragm compatible with atelectasis and or pneumonia. Right lung remains clear. No large effusion or pneumothorax. Trachea midline. Degenerative changes of the spine. IMPRESSION: Cardiomegaly with vascular congestion Left basilar atelectasis/pneumonia. Electronically Signed   By: Jerilynn Mages.  Shick M.D.   On: 06/19/2019 13:48   XR Chest Single View  Result Date: 06/18/2019 CLINICAL DATA:  Altered behavior EXAM: PORTABLE CHEST 1 VIEW COMPARISON:  None. FINDINGS: Mild cardiomegaly with central vascular congestion. Probable small pleural effusions. Patchy airspace disease left infrahilar lung. No pneumothorax. IMPRESSION: 1. Mild cardiomegaly with central vascular congestion and small pleural effusions. 2. Patchy airspace disease in the left infrahilar lung, atelectasis versus pneumonia. Electronically Signed   By: Donavan Foil M.D.   On: 06/18/2019 17:40   ECHOCARDIOGRAM COMPLETE  Result Date: 06/19/2019   ECHOCARDIOGRAM REPORT   Patient Name:   Levi Pruitt Date of Exam: 06/19/2019 Medical Rec #:  [id number]   Height:       69.0 in Accession #:    [id number]  Weight:       214.5 lb Date of Birth:  [birth year]   BSA:          2.13 m Patient Age:    34 years    BP:           126/69 mmHg Patient Gender: M           HR:           69 bpm. Exam Location:  Inpatient Procedure: 2D Echo Indications:    STEMI I21.3  History:        Patient has no prior history of Echocardiogram examinations.                 Stroke. AKI (acute kidney injury)                  Abnormal ECG.  Sonographer:    Vikki Ports Turrentine Referring Phys: [place]  1. Left ventricular ejection fraction, by visual estimation, is 25 to 30%. The left ventricle has severely decreased function. There is no left ventricular hypertrophy.  2. Elevated left atrial pressure.  3. Left ventricular diastolic parameters are consistent with Grade III diastolic dysfunction (restrictive).  4. Mildly dilated left ventricular internal cavity size.  5. The left ventricle demonstrates regional wall motion abnormalities.  6. Global right ventricle has normal systolic function.The right ventricular size is normal.  7. Left atrial size was normal.  8. Right atrial size was normal.  9. The mitral valve is normal in structure. Mild mitral valve regurgitation. No evidence of mitral stenosis. 10. The tricuspid valve is normal in structure. 11. The aortic valve is tricuspid. Aortic valve regurgitation is not visualized. Mild aortic valve sclerosis without stenosis. 12. The pulmonic valve was not well visualized. Pulmonic valve regurgitation is trivial. 13. The inferior vena cava is dilated in size with >50% respiratory variability, suggesting right atrial pressure of 8 mmHg. 14. Akinesis of the inferior, inferolateral, apical and distal septal walls; overall severe LV dysfunction; restrictive filling; mild LVE; mild MR. FINDINGS  Left Ventricle: Left ventricular ejection fraction, by visual estimation, is 25 to 30%. The left ventricle has severely decreased function. The left ventricle demonstrates regional wall motion abnormalities. The left ventricular internal cavity size was  mildly dilated  left ventricle. There is no left ventricular hypertrophy. Left ventricular diastolic parameters are consistent with Grade III diastolic dysfunction (restrictive). Elevated left atrial pressure. Right Ventricle: The right ventricular size is normal.Global RV systolic function is has normal systolic function.  Left Atrium: Left atrial size was normal in size. Right Atrium: Right atrial size was normal in size Pericardium: There is no evidence of pericardial effusion. Mitral Valve: The mitral valve is normal in structure. Mild mitral valve regurgitation. No evidence of mitral valve stenosis by observation. Tricuspid Valve: The tricuspid valve is normal in structure. Tricuspid valve regurgitation is trivial. Aortic Valve: The aortic valve is tricuspid. Aortic valve regurgitation is not visualized. Mild aortic valve sclerosis is present, with no evidence of aortic valve stenosis. Pulmonic Valve: The pulmonic valve was not well visualized. Pulmonic valve regurgitation is trivial. Pulmonic regurgitation is trivial. Aorta: The aortic root is normal in size and structure. Venous: The inferior vena cava is dilated in size with greater than 50% respiratory variability, suggesting right atrial pressure of 8 mmHg. IAS/Shunts: No atrial level shunt detected by color flow Doppler. Additional Comments: Akinesis of the inferior, inferolateral, apical and distal septal walls; overall severe LV dysfunction; restrictive filling; mild LVE; mild MR.  LEFT VENTRICLE PLAX 2D LVIDd:         5.80 cm       Diastology LVIDs:         4.60 cm       LV e' lateral:   5.33 cm/s LV PW:         0.90 cm       LV E/e' lateral: 22.3 LV IVS:        0.90 cm       LV e' medial:    3.68 cm/s LVOT diam:     2.00 cm       LV E/e' medial:  32.3 LV SV:         69 ml LV SV Index:   31.38 LVOT Area:     3.14 cm  LV Volumes (MOD) LV area d, A2C:    28.50 cm LV area d, A4C:    33.00 cm LV area s, A2C:    23.30 cm LV area s, A4C:    26.30 cm LV major d, A2C:   7.37 cm LV major d, A4C:   7.76 cm LV major s, A2C:   7.40 cm LV major s, A4C:   7.50 cm LV vol d, MOD A2C: 93.2 ml LV vol d, MOD A4C: 119.0 ml LV vol s, MOD A2C: 63.6 ml LV vol s, MOD A4C: 77.4 ml LV SV MOD A2C:     29.6 ml LV SV MOD A4C:     119.0 ml LV SV MOD BP:      37.6 ml RIGHT VENTRICLE RV S prime:      13.70 cm/s TAPSE (M-mode): 2.0 cm LEFT ATRIUM             Index       RIGHT ATRIUM           Index LA diam:        4.10 cm 1.93 cm/m  RA Area:     18.10 cm LA Vol (A2C):   60.5 ml 28.43 ml/m RA Volume:   49.30 ml  23.16 ml/m LA Vol (A4C):   68.4 ml 32.14 ml/m LA Biplane Vol: 64.2 ml 30.16 ml/m  AORTIC VALVE LVOT Vmax:   94.60 cm/s LVOT Vmean:  64.700 cm/s  LVOT VTI:    0.149 m  AORTA Ao Root diam: 2.70 cm MITRAL VALVE MV Area (PHT): 3.99 cm              SHUNTS MV PHT:        55.10 msec            Systemic VTI:  0.15 m MV Decel Time: 190 msec              Systemic Diam: 2.00 cm MV E velocity: 119.00 cm/s 103 cm/s MV A velocity: 55.30 cm/s  70.3 cm/s MV E/A ratio:  2.15        1.5  Kirk Ruths MD Electronically signed by Kirk Ruths MD Signature Date/Time: 06/19/2019/3:17:27 PM    Final    CT HEAD CODE STROKE WO CONTRAST  Result Date: 06/18/2019 CLINICAL DATA:  Code stroke. 74 year old male with right side weakness and facial droop. EXAM: CT HEAD WITHOUT CONTRAST TECHNIQUE: Contiguous axial images were obtained from the base of the skull through the vertex without intravenous contrast. COMPARISON:  None. FINDINGS: Brain: Large area of hypodense cytotoxic edema demonstrated from the mesial left temporal lobe through the left occipital pole in keeping with a large left PCA territory infarct. Mild regional mass effect. No associated hemorrhage. Comparatively mild hypodensity in the left thalamus. Superimposed chronic appearing encephalomalacia in the right temporal and occipital lobes. No MCA territory acute cortically based infarct changes identified. No acute intracranial hemorrhage identified. No ventriculomegaly. No midline shift and patent basilar cisterns. Vascular: Calcified atherosclerosis at the skull base. No suspicious intracranial vascular hyperdensity. Skull: No acute osseous abnormality identified. Sinuses/Orbits: Well pneumatized paranasal sinuses. Tympanic cavities and mastoids are clear.  Other: Visualized orbits and scalp soft tissues are within normal limits. ASPECTS [facility] Stroke Program Early CT Score) Total score (0-10 with 10 being normal): 10, but not applicable in this case given evidence of large acute left PCA infarct. IMPRESSION: 1. Large acute Left PCA territory infarct. No associated hemorrhage. Minor mass effect. 2. No acute anterior circulation infarct is evident (ASPECTS 10). 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 3. These results were communicated to Dr. Lorraine Lax at 2:59 pm on 06/18/2019 by text page via the [facility] messaging system. Electronically Signed   By: Genevie Ann M.D.   On: 06/18/2019 15:02        Scheduled Meds: .  stroke: mapping our early stages of recovery book   Does not apply Once  . aspirin  81 mg Oral Daily  . atorvastatin  80 mg Oral q1800  . free water  200 mL Per Tube Q3H  . furosemide  40 mg Intravenous Once  . heparin  5,000 Units Subcutaneous Q8H  . ipratropium-albuterol  3 mL Nebulization Q6H   Continuous Infusions: . azithromycin 500 mg (06/19/19 1522)  . cefTRIAXone (ROCEPHIN)  IV Stopped (06/19/19 1521)  . feeding supplement (JEVITY 1.2 CAL)       LOS: 2 days    Time spent: 35 minutes   Dana Allan, MD  [facility] Pager #: [phone] 7PM-7AM contact night coverage as above

## 2019-06-21 ENCOUNTER — Inpatient Hospital Stay (HOSPITAL_COMMUNITY): Payer: Medicare Other

## 2019-06-21 DIAGNOSIS — I519 Heart disease, unspecified: Secondary | ICD-10-CM

## 2019-06-21 DIAGNOSIS — R9431 Abnormal electrocardiogram [ECG] [EKG]: Secondary | ICD-10-CM

## 2019-06-21 DIAGNOSIS — I213 ST elevation (STEMI) myocardial infarction of unspecified site: Secondary | ICD-10-CM

## 2019-06-21 LAB — RENAL FUNCTION PANEL
Albumin: 2.4 g/dL — ABNORMAL LOW (ref 3.5–5.0)
Anion gap: 12 (ref 5–15)
BUN: 32 mg/dL — ABNORMAL HIGH (ref 8–23)
CO2: 20 mmol/L — ABNORMAL LOW (ref 22–32)
Calcium: 8.4 mg/dL — ABNORMAL LOW (ref 8.9–10.3)
Chloride: 114 mmol/L — ABNORMAL HIGH (ref 98–111)
Creatinine, Ser: 1.47 mg/dL — ABNORMAL HIGH (ref 0.61–1.24)
GFR calc Af Amer: 54 mL/min — ABNORMAL LOW (ref >60)
GFR calc non Af Amer: 47 mL/min — ABNORMAL LOW (ref >60)
Glucose, Bld: 109 mg/dL — ABNORMAL HIGH (ref 70–99)
Phosphorus: 4 mg/dL (ref 2.5–4.6)
Potassium: 3.8 mmol/L (ref 3.5–5.1)
Sodium: 146 mmol/L — ABNORMAL HIGH (ref 135–145)

## 2019-06-21 LAB — CBC WITH DIFFERENTIAL/PLATELET
Abs Immature Granulocytes: 0.23 10*3/uL — ABNORMAL HIGH (ref 0.00–0.07)
Basophils Absolute: 0.1 10*3/uL (ref 0.0–0.1)
Basophils Relative: 0 %
Eosinophils Absolute: 0.1 10*3/uL (ref 0.0–0.5)
Eosinophils Relative: 0 %
HCT: 40.1 % (ref 39.0–52.0)
Hemoglobin: 13 g/dL (ref 13.0–17.0)
Immature Granulocytes: 1 %
Lymphocytes Relative: 7 %
Lymphs Abs: 1.1 10*3/uL (ref 0.7–4.0)
MCH: 31.3 pg (ref 26.0–34.0)
MCHC: 32.4 g/dL (ref 30.0–36.0)
MCV: 96.4 fL (ref 80.0–100.0)
Monocytes Absolute: 1.7 10*3/uL — ABNORMAL HIGH (ref 0.1–1.0)
Monocytes Relative: 11 %
Neutro Abs: 13.3 10*3/uL — ABNORMAL HIGH (ref 1.7–7.7)
Neutrophils Relative %: 81 %
Platelets: 307 10*3/uL (ref 150–400)
RBC: 4.16 MIL/uL — ABNORMAL LOW (ref 4.22–5.81)
RDW: 13.3 % (ref 11.5–15.5)
WBC: 16.4 10*3/uL — ABNORMAL HIGH (ref 4.0–10.5)
nRBC: 0 % (ref 0.0–0.2)

## 2019-06-21 LAB — HCV INTERPRETATION

## 2019-06-21 LAB — MAGNESIUM: Magnesium: 2.4 mg/dL (ref 1.7–2.4)

## 2019-06-21 LAB — GLUCOSE, CAPILLARY
Glucose-Capillary: 97 mg/dL (ref 70–99)
Glucose-Capillary: 103 mg/dL — ABNORMAL HIGH (ref 70–99)
Glucose-Capillary: 107 mg/dL — ABNORMAL HIGH (ref 70–99)
Glucose-Capillary: 97 mg/dL (ref 70–99)
Glucose-Capillary: 94 mg/dL (ref 70–99)

## 2019-06-21 LAB — HCV AB W REFLEX TO QUANT PCR: HCV Ab: <0.1 s/co ratio (ref 0.0–0.9)

## 2019-06-21 MED ORDER — DEXTROSE-NACL 5-0.9 % IV SOLN: INTRAVENOUS | Status: DC

## 2019-06-21 MED ORDER — LIDOCAINE VISCOUS HCL 2 % MT SOLN
15.0000 mL | Freq: Once | OROMUCOSAL | Status: AC
  Administered 2019-06-21: 6 mL via OROMUCOSAL

## 2019-06-21 MED ORDER — PRO-STAT SUGAR FREE PO LIQD
30.0000 mL | Freq: Two times a day (BID) | ORAL | Status: DC
  Administered 2019-06-21: 30 mL
  Filled 2019-06-21: qty 30

## 2019-06-21 MED ORDER — VITAL HIGH PROTEIN PO LIQD
1000.0000 mL | ORAL | Status: DC
  Administered 2019-06-21: 1000 mL
  Filled 2019-06-21: qty 1000

## 2019-06-21 MED ORDER — DEXTROSE 5 % IV SOLN: INTRAVENOUS | Status: DC

## 2019-06-21 MED ORDER — IOHEXOL 300 MG/ML  SOLN
50.0000 mL | Freq: Once | INTRAMUSCULAR | Status: AC | PRN
  Administered 2019-06-21: 20 mL

## 2019-06-21 MED ORDER — CLOPIDOGREL BISULFATE 75 MG PO TABS
75.0000 mg | ORAL_TABLET | Freq: Every day | ORAL | Status: DC
  Administered 2019-06-22 – 2019-06-30 (×8): 75 mg
  Filled 2019-06-21 (×9): qty 1

## 2019-06-21 MED ORDER — ASPIRIN 81 MG PO CHEW
81.0000 mg | CHEWABLE_TABLET | Freq: Every day | ORAL | Status: DC
  Administered 2019-06-22 – 2019-06-25 (×4): 81 mg
  Filled 2019-06-21 (×4): qty 1

## 2019-06-21 NOTE — Progress Notes (Signed)
Modified Barium Swallow Progress Note  Patient Details  Name: Levi Pruitt MRN: [id number] Date of Birth: 02/18/1946  Today's Date: 06/21/2019  Modified Barium Swallow completed.  Full report located under Chart Review in the Imaging Section.  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 Pt required maximal cueing for improving alertness throughout MBSS.  Oral deficits include right sided oral motor deficits including reduced lingual coordination, oral holding, intermittent right sided anterior spillage, right sided buccal pocketing, and delayed oral transit. Thin liquid by teaspoon bolus, extracted from oral cavity after prolonged oral holding despite cues. Oral suction utilized post MBSS to assist with removal of oral residuals.  Pharyngeal deficits c/b delay in swallow initiation, decreased timely epiglottic inversion, reduced base of tongue retraction and decreased laryngeal elevation. This allowed for intermittent pre swallow penetration of nectar thick liquids, intermittent during the swallow penetration of honey thick liquids by cup. Despite no aspiration visualized on MBSS this date, risk remains significantly increased due to fluctuation in patients mentation and deficits noted on exam. Recommend continue NPO with short term alternative nutrition with PO trials with SLP (nectar thick, honey thick, and puree POs) and hopes of diet advancement. SLP to follow up.    Swallow Evaluation Recommendations       SLP Diet Recommendations: Alternative means - temporary       Medication Administration: Via alternative means               Oral Care Recommendations: Oral care QID        Sharifah Champine E Micheil Klaus MA, CCC-SLP Acute Rehabilitation Services 06/21/2019,12:06 PM

## 2019-06-21 NOTE — Evaluation (Signed)
Clinical/Bedside Swallow Evaluation Patient Details  Name: Levi Pruitt MRN: [id number] Date of Birth: September 06, 1945  Today's Date: 06/21/2019 Time: SLP Start Time (ACUTE ONLY): [id number] SLP Stop Time (ACUTE ONLY): 0851 SLP Time Calculation (min) (ACUTE ONLY): 27 min  Past Medical History:  Past Medical History:  Diagnosis Date  . 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 No pertinent surgical history. HPI:   74 year old Caucasian male, obese, with no other documented past medical history. Patient is not able to provide and history.  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   Assessment / Plan / Recommendation Clinical Impression  Pt presents with concern for pharyngeal dysphagia and oral deficits post CVA. Pt with reduced management of saliva, dried secretions throughout oral cavity, improved following diligent oral care by SLP. Pt required increased cues for improving alertness, lethargy exhibited. Right sided oral motor deficits apparent including right facial droop, decreased strength and ROM of facial, labial and lingual musculature. Pt with significantly prolonged mastication of ice chips, delayed oral transit, and suspected delay in swallow initiation per palpation. Laryngeal elevation also appeared reduced/weakned. Overt delayed cough exhibited with thin liquids via cup concerning for decreased airway protection. Recommend MBSS to further assess. MBSS to be completed this morning. Continue NPO with medicines via alternative means.  SLP Visit Diagnosis: Dysphagia, unspecified (R13.10)    Aspiration Risk  Moderate aspiration risk    Diet Recommendation   NPO, MBSS planned 11 am   Medication Administration: Via alternative means     Other  Recommendations Oral Care Recommendations: Oral care QID   Follow up Recommendations Inpatient Rehab(pending OT/PT recommendations)      Frequency and Duration            Prognosis Prognosis for Safe Diet Advancement: Fair Barriers to Reach Goals: Time post onset      Swallow Study   General Date of Onset: 06/19/19 HPI:  74 year old Caucasian male, obese, with no other documented past medical history. Patient is not able to provide and history.  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 Type of Study: Bedside Swallow Evaluation Previous Swallow Assessment: none on file Diet Prior to this Study: NPO Temperature Spikes Noted: No Respiratory Status: Nasal cannula History of Recent Intubation: No Behavior/Cognition: Lethargic/Drowsy;Requires cueing;Doesn't follow directions Oral Cavity Assessment: Dry;Dried secretions Oral Care Completed by SLP: Yes Oral Cavity - Dentition: Adequate natural dentition;Missing dentition Vision: Impaired for self-feeding Self-Feeding Abilities: Total assist Patient Positioning: Upright in bed Baseline Vocal Quality: Low vocal intensity Volitional Cough: Cognitively unable to elicit Volitional Swallow: Unable to elicit    Oral/Motor/Sensory Function Overall Oral Motor/Sensory Function: Moderate impairment Facial ROM: Suspected CN VII (facial) dysfunction;Reduced right Facial Symmetry: Abnormal symmetry right;Suspected CN VII (facial) dysfunction Facial Strength: Reduced right;Suspected CN VII (facial) dysfunction Facial Sensation: Reduced right;Suspected CN V (Trigeminal) dysfunction Lingual ROM: Reduced right;Suspected CN XII (hypoglossal) dysfunction   Ice Chips Ice chips: Impaired Presentation: Spoon Oral Phase Impairments: Reduced labial  seal;Reduced lingual movement/coordination;Impaired mastication Oral Phase Functional Implications: Prolonged oral transit;Right lateral sulci pocketing Pharyngeal Phase Impairments: Suspected delayed Swallow;Multiple swallows;Decreased hyoid-laryngeal movement   Thin Liquid Thin  Liquid: Impaired Presentation: Cup Oral Phase Impairments: Reduced labial seal;Reduced lingual movement/coordination Oral Phase Functional Implications: Prolonged oral transit;Oral residue;Right anterior spillage Pharyngeal  Phase Impairments: Suspected delayed Swallow;Decreased hyoid-laryngeal movement;Cough - Delayed;Multiple swallows    Nectar Thick Nectar Thick Liquid: Not tested   Honey Thick Honey Thick Liquid: Not tested   Puree Puree: Impaired Presentation: Spoon Oral Phase Impairments: Reduced labial seal;Impaired mastication;Reduced lingual movement/coordination Oral Phase Functional Implications: Prolonged oral transit;Oral residue;Right anterior spillage Pharyngeal Phase Impairments: Suspected delayed Swallow;Multiple swallows;Decreased hyoid-laryngeal movement   Solid     Solid: Not tested      Mercer Stallworth E Feras Gardella MA, CCC-SLP  Acute Rehabilitation Services 06/21/2019,9:17 AM

## 2019-06-21 NOTE — Progress Notes (Signed)
Initial Nutrition Assessment  DOCUMENTATION CODES:   Obesity unspecified  INTERVENTION:   TF recommendations via Cortrak. If not placed today, Cortrak team service available 1/12.   -Osmolite 1.5 @ 20 ml/hr, advance by 10 ml every 4 hours to goal rate of 60 ml/hr. -Free water flushes of 200 ml QID (800 ml) -This will provide 2160 kcals, 90g protein and 1897 ml H2O.  NUTRITION DIAGNOSIS:   Inadequate oral intake related to dysphagia(s/p CVA) as evidenced by NPO status.  GOAL:   Patient will meet greater than or equal to 90% of their needs  MONITOR:   Diet advancement, Labs, Weight trends, I & O's  REASON FOR ASSESSMENT:   Consult Enteral/tube feeding initiation and management  ASSESSMENT:   74 year old Caucasian male, obese, with no other documented past medical history. Patient is not able to provide and history.  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  1/9: admitted for acute CVA  **RD working remotely**  Pt with dysarthria following CVA. Pt currently NPO. Staff unable to place NGT successfully.  SLP evaluated today and recommends MBS today. If unable to safely swallow PO, pt will have Cortrak placed either by IR or Cortrak team on 1/12.   Per weight records, no weight history available in chart PTA. Admission weight: 214 lbs. Current weight: 220 lbs.  I/Os: -159 ml since admit UOP: 900 ml x 24 hrs  Medications: D5 infusion Labs reviewed: CBGs: 97-103 Elevated Na  Mg/Phos WNL  NUTRITION - FOCUSED PHYSICAL EXAM:  Working remotely.  Diet Order:   Diet Order            Diet NPO time specified  Diet effective now              EDUCATION NEEDS:   No education needs have been identified at this time  Skin:  Skin Assessment: Reviewed RN Assessment  Last  BM:  PTA  Height:   Ht Readings from Last 1 Encounters:  06/20/19 5\' 9"  (1.753 m)    Weight:   Wt Readings from Last 1 Encounters:  06/21/19 99.9 kg    Ideal Body Weight:  72.7 kg  BMI:  Body mass index is 32.53 kg/m.  Estimated Nutritional Needs:   Kcal:  2000-2200  Protein:  90-105g  Fluid:  2L/day  Clayton Bibles, MS, RD, LDN Inpatient Clinical Dietitian Pager: [phone] After Hours Pager: [phone]

## 2019-06-21 NOTE — Progress Notes (Signed)
Cortrak Tube Team Note:  Consult received to place a Cortrak feeding tube.   Small bore feeding tube placed by diagnostic radiology. RD secured tube in R nare at 112 cm marking with bridle.   Mariana Single RD, LDN Clinical Nutrition Pager # [phone]

## 2019-06-21 NOTE — Progress Notes (Signed)
STROKE TEAM PROGRESS NOTE   INTERVAL HISTORY Wife at bedside. Pt lethargic, barely open eyes on voice, not quite following commands today due to lethargy. Still pending cortrak placement. On IVF. Na 146 and Cre continues to trending up. Will put in cortrak today and start DAPT via tube.    OBJECTIVE Vitals:   06/21/19 0100 06/21/19 0355 06/21/19 0600 06/21/19 0735  BP:  125/61 117/68 132/79  Pulse: 77 68 60 71  Resp: 20 (!) 26 (!) 21 (!) 26  Temp:  97.7 F (36.5 C)  98.5 F (36.9 C)  TempSrc:  Oral  Axillary  SpO2: 94% 94% 91% 99%  Weight:  99.9 kg    Height:        CBC:  Recent Labs  Lab 06/18/19 1439 06/18/19 1447 06/20/19 1054 06/21/19 0246  WBC 17.3*   < > 16.6* 16.4*  NEUTROABS 13.8*  --   --  13.3*  HGB 13.9  --  12.8* 13.0  HCT 42.7  --  40.3 40.1  MCV 97.3   < > 99.0 96.4  PLT 243   < > 273 307   < > = values in this interval not displayed.    Basic Metabolic Panel:  Recent Labs  Lab 06/20/19 0407 06/20/19 1054 06/21/19 0246  NA 141  --  146*  K 4.0  --  3.8  CL 112*  --  114*  CO2 18*  --  20*  GLUCOSE 100*  --  109*  BUN 33*  --  32*  CREATININE 1.37* 1.49* 1.47*  CALCIUM 8.0*  --  8.4*  MG 2.6*  --  2.4  PHOS  --   --  4.0    Lipid Panel:     Component Value Date/Time   CHOL 152 06/19/2019 0422   TRIG 107 06/19/2019 0422   HDL 32 (L) 06/19/2019 0422   CHOLHDL 4.8 06/19/2019 0422   VLDL 21 06/19/2019 0422   LDLCALC 99 06/19/2019 0422   HgbA1c:  Lab Results  Component Value Date   HGBA1C 5.3 06/19/2019   Urine Drug Screen:     Component Value Date/Time   LABOPIA NONE DETECTED 06/19/2019 1420   COCAINSCRNUR NONE DETECTED 06/19/2019 1420   LABBENZ NONE DETECTED 06/19/2019 1420   AMPHETMU NONE DETECTED 06/19/2019 1420   THCU NONE DETECTED 06/19/2019 1420   LABBARB NONE DETECTED 06/19/2019 1420    Alcohol Level No results found for: [place]  IMAGING  CT Code Stroke CTA Head W/WO contrast CT Code Stroke CTA Neck W/WO contrast CT  Code Stroke Cerebral Perfusion with contrast 06/18/2019 IMPRESSION:  1. Positive for acute appearing occlusions of the distal Left Vertebral Artery and Left PCA: - distal left V4 occlusion, with upstream multifocal moderate and severe left vertebral stenosis in the neck. - left PCA P1 occlusion just beyond the origin.  2. CTP detects only some of the left PCA territory core infarct which was visible by plain CT.  3. 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 The right vertebral artery and basilar arteries are patent without stenosis.  4. 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  5. Mild to moderate irregularity of the bilateral MCA branches.  6. Small bilateral pleural effusions with increased pulmonary septal thickening suggestive of pulmonary edema.  7. 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 Recommend  follow-up with ENT.   CT HEAD CODE STROKE WO CONTRAST 06/18/2019 IMPRESSION:  1. Large acute Left PCA territory infarct. No associated hemorrhage. Minor mass effect.  2. No acute anterior circulation infarct is evident (ASPECTS 10). 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   MRI Brain WO Contrast 1. Large acute left PCA infarct. 2. Small acute left frontal lobe infarct. 3. 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  XR Chest Single View 06/18/2019 IMPRESSION:  1. Mild cardiomegaly with central vascular congestion and small pleural effusions.  2. Patchy airspace disease in the left infrahilar lung, atelectasis versus pneumonia.   Transthoracic Echocardiogram  06/19/2019 IMPRESSIONS:  1. Left ventricular ejection fraction, by visual estimation, is 25 to 30%. The left ventricle has severely decreased function. There is no left ventricular hypertrophy.  2. Elevated left atrial  pressure.  3. Left ventricular diastolic parameters are consistent with Grade III diastolic dysfunction (restrictive).  4. Mildly dilated left ventricular internal cavity size.  5. The left ventricle demonstrates regional wall motion abnormalities.  6. Global right ventricle has normal systolic function.The right ventricular size is normal.  7. Left atrial size was normal.  8. Right atrial size was normal.  9. The mitral valve is normal in structure. Mild mitral valve regurgitation. No evidence of mitral stenosis. 10. The tricuspid valve is normal in structure. 11. The aortic valve is tricuspid. Aortic valve regurgitation is not visualized. Mild aortic valve sclerosis without stenosis. 12. The pulmonic valve was not well visualized. Pulmonic valve regurgitation is trivial. 13. The inferior vena cava is dilated in size with >50% respiratory variability, suggesting right atrial pressure of 8 mmHg. 14. Akinesis of the inferior, inferolateral, apical and distal septal walls; overall severe LV dysfunction; restrictive filling; mild LVE; mild MR   PHYSICAL EXAM   Temp:  [97.1 F (36.2 C)-98.9 F (37.2 C)] 98.5 F (36.9 C) (01/11 0735) Pulse Rate:  [60-77] 71 (01/11 0735) Resp:  [20-33] 26 (01/11 0735) BP: (99-132)/(60-80) 132/79 (01/11 0735) SpO2:  [91 %-99 %] 99 % (01/11 0735) Weight:  [99.9 kg] 99.9 kg (01/11 0355)  General - Well nourished, well developed, lethargic.  Ophthalmologic - fundi not visualized due to noncooperation.  Cardiovascular - Regular rhythm and rate.  Neuro - lethargic barely open eyes today with voice.  Did follow simple commands today, nonverbal due to lethargy.  PERRL, more left gaze preference, right hemianopia, not blinking to visual threat on the right, but able to blink to visual threat on the left.  Right facial droop, tongue midline.  Right upper and lower extremity flaccid, only slight withdrawal on the right lower extremity.  Left upper extremity at least  4/5, but did not follow commands on left hand.  Left lower extremity 3-/5.  DTR 1+, no Babinski.  Sensation and coordination not corporative.  Gait not tested.   ASSESSMENT/PLAN Mr. Yohann Schoenig is a 74 y.o. male with no known significant past medical history presenting as a code stroke with c/o slurred speech, right arm flaccid. Cardiology consult for STEMI.  He did not receive IV t-PA due to late presentation (>4.5 hours from time of onset).  Stroke: Large left PCA territory acute infarct - embolic patter - unknown source, could be related to STEMI or large vessel atherosclerosis.  Resultant right hemianopia, right hemiplegia, right facial droop, severe dysarthria  Code Stroke CT Head - Large acute Left PCA territory infarct. No associated hemorrhage. Minor mass effect. No acute anterior circulation infarct is evident (ASPECTS 10).  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   MRI head - large acute left PCA infarct, small acute left frontal lobe infarct, chronic right temporal and right occipital lobe infarcts  CTA H&N - acute occlusions of the distal Left V4 and Left P1. Chronic occlusion of the right PCA. 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   2D Echo - EF 25 - 30%. Overall severe LV dysfunction.   Sars Corona Virus 2 - negative  LDL - 99  HgbA1c 5.3  UDS - negative  VTE prophylaxis - Heparin subq  No antithrombotic prior to admission, was on aspirin 81 mg daily and heparin IV. Now on ASA 81 and plavix DAPT. Will repeat TTE in one week, if EF still < 30-35%, will recommend anticoagulation at that time.   Therapy recommendations:  pending  Disposition:  Pending  STEMI - inferior wall  Troponin -12,799-> 12,437-> 10,883-> 8791  EKG - II, III, aVF STT elevation  EF 25 - 30%. Overall severe LV dysfunction.  Cardiology on  board  On aspirin PR  Heparin IV was discontinued by cardiology  Will repeat TTE in one week, if EF still < 30-35%, will recommend anticoagulation at that time.  EKG concerning for inferior leads MI  Acute pulmonary edema  CXR - Small bilateral pleural effusions with increased pulmonary septal thickening suggestive of pulmonary edema.   Intermittent wheezing 06/19/19  Off IVF and on cortrak with TF  S/p lasix  CXR pending  Aspiration pneumonia versus pneumonitis  Severe dysarthria and difficulty handling oral secretions  Intermittent wheezing  Leukocytosis -WBC 17.3 ->15.7->16.6->16.4   On Rocephin and azithromycin (started 06/19/19)  Treatment per primary team  BP measurement  Home BP meds: none   Current BP meds: none   Stable on the low end  Cardiology holding off BB, CCB now . Permissive hypertension (OK if <180/105) but gradually normalize in 3-5 days  . Long-term BP goal normotensive  Hyperlipidemia  Home Lipid lowering medication: none   LDL 99, goal < 70  Current lipid lowering medication: Lipitor 80 mg daily   Continue statin at discharge  AKI vs. CKD  Cre 1.66 ->1.38->1.49->1.47  On TF  BMP monitoring   Dysphagia . Secondary to stroke . NPO . Attempted NG placement failed at bedside . Cortrak place under fluoro . On TF . 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 Recommend follow-up with ENT.   Small bilateral pleural effusions with increased pulmonary septal thickening suggestive of pulmonary edema.   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 This patient's care requiresreview of multiple databases, neurological assessment, discussion with family, other specialists and medical decision making of high complexity. I had long discussion with wife at bedside, updated pt  current condition, treatment plan and potential prognosis, and answered all the questions.  Daughter expressed understanding and appreciation.   Rosalin Hawking, MD PhD Stroke Neurology 06/21/2019 11:58 AM   To contact Stroke Continuity provider, please refer to [email or web address] After hours, contact General Neurology

## 2019-06-21 NOTE — Progress Notes (Signed)
Designated visitor is Mining engineer.  A woman named [name] who states she is patient's medical POA is in the patient's room today.  This Probation officer informed [name] of the visitation policy.

## 2019-06-21 NOTE — Progress Notes (Signed)
Hospitalist progress note   Levi Pruitt [id number] DOB: Jun 29, 1945 DOA: 06/18/2019  PCP: Patient, No Pcp Per   Narrative:  17 white male-known history of obesity BMI 32 presented code stroke 06/18/2019-CT = large acute PCA infarct EKG = ST elevation MI with reciprocal changes-patient started on heparin Cardiology and neurology consulted  Data Reviewed:  Sodium up from 1 41-1 46, bicarb up from 18-20 BUN/creatinine 33/1.37-->32/1.47 White count down from 18.4-16.3, hemoglobin 13.0, platelets 307 Echocardiogram 06/19/2019 = EF 25-30% decreased function grade 3 diastolic dysfunction CXR [id number] cardiomegaly with vascular congestion Assessment & Plan: Large acute PCA infarct Significant deficits with dense hemiparesis/plegia on the left side He is n.p.o--continue with rectal aspirin Acute inferior STEMI Initially was on heparin troponins trended up and cardiology did not feel he was a candidate for anything but medical interventions Heparin IV was discontinued on 11/10-needs repeat echocardiogram 1 week to determine if needs to be on anticoagulation Systolic, diastolic heart failure with pulmonary edema IV fluid was discontinued-was given Lasix-Was given Lasix x1 on 1/10 and 1/11 Probable aspiration pneumonia Continue Ceftriaxone and azithromycin at this time Dysphagia status post placement of NG tube Very dense hemiplegia and cannot place NG tube Nursing aware to alert MD if unable to get diet after MBS scheduled for later today May need core track and further goals of care discussions CKD stage II-III, mild hypernatremia, mild metabolic acidosis Aim to keep fluid balance even-has been given Lasix on 2 days successively Placed on D5 as not getting any p.o. nutrition and is becoming slightly hypernatremic and azotemic BMI >30  Prophylactic Lovenox, inpatient, no family at bedside and awaiting work-up Patient is not ready for discharge still planning on working up for dysphagia and further  management as per consultants  Subjective: Awake coherent able to verbalize some but speech very thick and difficult to discern completely Twisting of mouth to right side Can move his left upper extremity and follow commands however  Objective: Vitals:   06/20/19 2347 06/21/19 0100 06/21/19 0355 06/21/19 0600  BP: 130/67  125/61 117/68  Pulse: 71 77 68 60  Resp: (!) 22 20 (!) 26 (!) 21  Temp: 98.7 F (37.1 C)  97.7 F (36.5 C)   TempSrc: Oral  Oral   SpO2: 96% 94% 94% 91%  Weight:   99.9 kg   Height:        Intake/Output Summary (Last 24 hours) at 06/21/2019 0719 Last data filed at 06/21/2019 0700 Gross per 24 hour  Intake --  Output 900 ml  Net -900 ml   Filed Weights   06/19/19 0624 06/20/19 0323 06/21/19 0355  Weight: 97.3 kg 104 kg 99.9 kg    Examination: EOMI?  Visual field cut on the right side he is unable to tell me the fingers on the temporal area of his right side S1-S2 no murmur on monitors he has PVCs Chest is relatively clear Abdomen is soft nontender no rebound Dense weakness to entire right side some hyperemia and swelling left arm and some edema Can do finger-nose-finger on left side but cannot raise right Reflexes are attenuated and diminished bilaterally sensory hard to determine  Scheduled Meds: .  stroke: mapping our early stages of recovery book   Does not apply Once  . aspirin  300 mg Rectal Daily  . atorvastatin  80 mg Oral q1800  . free water  200 mL Per Tube Q3H  . furosemide  40 mg Intravenous Once  . heparin  5,000 Units Subcutaneous Q8H  Continuous Infusions: . azithromycin 500 mg (06/20/19 1421)  . cefTRIAXone (ROCEPHIN)  IV 1 g (06/20/19 1420)  . feeding supplement (JEVITY 1.2 CAL)       LOS: 3 days   Time spent: [place], MD [facility]  06/21/2019, 7:19 AM

## 2019-06-21 NOTE — Progress Notes (Addendum)
Progress Note  Patient Name: Levi Pruitt Date of Encounter: 06/21/2019  Primary Cardiologist: No primary care provider on file.  Subjective   Sitting up in bed.   Inpatient Medications    Scheduled Meds: .  stroke: mapping our early stages of recovery book   Does not apply Once  . aspirin  300 mg Rectal Daily  . atorvastatin  80 mg Oral q1800  . free water  200 mL Per Tube Q3H  . furosemide  40 mg Intravenous Once  . heparin  5,000 Units Subcutaneous Q8H   Continuous Infusions: . azithromycin 500 mg (06/20/19 1421)  . cefTRIAXone (ROCEPHIN)  IV 1 g (06/20/19 1420)  . dextrose 50 mL/hr at 06/21/19 0819  . feeding supplement (JEVITY 1.2 CAL)     PRN Meds: acetaminophen **OR** acetaminophen (TYLENOL) oral liquid 160 mg/5 mL **OR** acetaminophen, ipratropium-albuterol   Vital Signs    Vitals:   06/21/19 0100 06/21/19 0355 06/21/19 0600 06/21/19 0735  BP:  125/61 117/68 132/79  Pulse: 77 68 60 71  Resp: 20 (!) 26 (!) 21 (!) 26  Temp:  97.7 F (36.5 C)  98.5 F (36.9 C)  TempSrc:  Oral  Axillary  SpO2: 94% 94% 91% 99%  Weight:  99.9 kg    Height:        Intake/Output Summary (Last 24 hours) at 06/21/2019 1010 Last data filed at 06/21/2019 0700 Gross per 24 hour  Intake --  Output 900 ml  Net -900 ml   Last 3 Weights 06/21/2019 06/20/2019 06/19/2019  Weight (lbs) 220 lb 4.8 oz 229 lb 3.2 oz 214 lb 8 oz  Weight (kg) 99.927 kg 103.964 kg 97.297 kg      Telemetry    SR - Personally Reviewed  ECG    SR with persistent ST elevation in inferior leads   Physical Exam  Older WM, sitting up in bed. Does not answer questions at the moment. GEN: No acute distress.   Neck: No JVD Cardiac: RRR, no murmurs, rubs, or gallops.  Respiratory: Clear to auscultation bilaterally. GI: Soft, nontender, non-distended  MS: No edema; No deformity. Neuro:  Weak on right side. Psych: Normal affect   Labs    High Sensitivity Troponin:   Recent Labs  Lab 06/18/19 1531  06/18/19 1828 06/18/19 2212 06/19/19 0000  TROPONINIHS 12,799* 12,437* 10,883* 8,791*      Chemistry Recent Labs  Lab 06/18/19 1439 06/19/19 0422 06/19/19 1248 06/20/19 0407 06/20/19 1054 06/21/19 0246  NA 138 139 141 141  --  146*  K 3.7 4.0 4.0 4.0  --  3.8  CL 105 107  --  112*  --  114*  CO2 20* 20*  --  18*  --  20*  GLUCOSE 102* 96  --  100*  --  109*  BUN 43* 38*  --  33*  --  32*  CREATININE 1.66* 1.38*  --  1.37* 1.49* 1.47*  CALCIUM 8.5* 8.3*  --  8.0*  --  8.4*  PROT 6.7 6.6  --   --   --   --   ALBUMIN 2.9* 2.7*  --   --   --  2.4*  AST 73* 63*  --   --   --   --   ALT 60* 59*  --   --   --   --   ALKPHOS 69 68  --   --   --   --   BILITOT 0.9 1.1  --   --   --   --  GFRNONAA 40* 50*  --  51* 46* 47*  GFRAA 47* 58*  --  59* 53* 54*  ANIONGAP 13 12  --  11  --  12     Hematology Recent Labs  Lab 06/20/19 0407 06/20/19 1054 06/21/19 0246  WBC 18.4* 16.6* 16.4*  RBC 4.02* 4.07* 4.16*  HGB 12.6* 12.8* 13.0  HCT 39.5 40.3 40.1  MCV 98.3 99.0 96.4  MCH 31.3 31.4 31.3  MCHC 31.9 31.8 32.4  RDW 13.2 13.2 13.3  PLT 266 273 307    BNP Recent Labs  Lab 06/19/19 1515  BNP 756.3*     DDimer No results for input(s): DDIMER in the last 168 hours.   Radiology    MR BRAIN WO CONTRAST  Result Date: 06/19/2019 CLINICAL DATA:  Stroke follow-up. EXAM: MRI HEAD WITHOUT CONTRAST TECHNIQUE: Multiplanar, multiecho pulse sequences of the brain and surrounding structures were obtained without intravenous contrast. COMPARISON:  Head CT, CTA, and CTP 06/18/2019 FINDINGS: Brain: As seen on CT, there is a large acute left PCA infarct involving the medial aspects of the left temporal and occipital lobes. The left thalamus, posterior limb of the left internal capsule, left splenium of the corpus callosum, left mamillary body, and left cerebral peduncle are also involved. There is also a 7 mm acute cortical infarct in the posterior left frontal lobe which involves the  anterior aspect of the precentral gyrus. There is no hemorrhage associated with these acute infarcts. Chronic infarcts are again noted in the right temporal and right occipital lobes. There is a single punctate focus of susceptibility artifact in the right cerebellum suggesting a chronic microhemorrhage. Small chronic infarcts are noted in the cerebellum bilaterally. Scattered small foci of T2 hyperintensity in the cerebral white matter bilaterally are nonspecific but compatible with minimal chronic small vessel ischemic disease. There is no mass, midline shift, or extra-axial fluid collection. There is mild global cerebral atrophy. Vascular: Abnormal appearance of the distal left vertebral artery corresponding to occlusion on CT. Skull and upper cervical spine: No suspicious marrow lesion. Sinuses/Orbits: Unremarkable orbits. Paranasal sinuses and mastoid air cells are clear. Other: None. IMPRESSION: 1. Large acute left PCA infarct. 2. Small acute left frontal lobe infarct. 3. 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 Electronically Signed   By: Logan Bores M.D.   On: 06/19/2019 11:27   DG CHEST PORT 1 VIEW  Result Date: 06/20/2019 CLINICAL DATA:  Patient admitted with a stroke. EXAM: PORTABLE CHEST 1 VIEW COMPARISON:  June 19, 2019 FINDINGS: Persistent cardiomegaly and vascular congestion. The vascular congestion has worsened. No other interval changes. IMPRESSION: Cardiomegaly and worsening vascular congestion/mild edema. Electronically Signed   By: Dorise Bullion III M.D   On: 06/20/2019 18:10   DG CHEST PORT 1 VIEW  Result Date: 06/19/2019 CLINICAL DATA:  Acute left PCA infarct EXAM: PORTABLE CHEST 1 VIEW COMPARISON:  06/18/2019 FINDINGS: Stable cardiomegaly with central vascular congestion. Left basilar airspace opacity/consolidation obscures the left hemidiaphragm compatible with atelectasis and or pneumonia. Right lung remains clear. No large effusion or pneumothorax. Trachea  midline. Degenerative changes of the spine. IMPRESSION: Cardiomegaly with vascular congestion Left basilar atelectasis/pneumonia. Electronically Signed   By: Jerilynn Mages.  Shick M.D.   On: 06/19/2019 13:48   ECHOCARDIOGRAM COMPLETE  Result Date: 06/19/2019   ECHOCARDIOGRAM REPORT   Patient Name:   Gaius Knight Date of Exam: 06/19/2019 Medical Rec #:  [id number]   Height:       69.0 in Accession #:  [id number]  Weight:       214.5 lb Date of Birth:  1945/06/22   BSA:          2.13 m Patient Age:    9 years    BP:           126/69 mmHg Patient Gender: M           HR:           69 bpm. Exam Location:  Inpatient Procedure: 2D Echo Indications:    STEMI I21.3  History:        Patient has no prior history of Echocardiogram examinations.                 Stroke. AKI (acute kidney injury)                 Abnormal ECG.  Sonographer:    Vikki Ports [place] Referring Phys: [place]  1. Left ventricular ejection fraction, by visual estimation, is 25 to 30%. The left ventricle has severely decreased function. There is no left ventricular hypertrophy.  2. Elevated left atrial pressure.  3. Left ventricular diastolic parameters are consistent with Grade III diastolic dysfunction (restrictive).  4. Mildly dilated left ventricular internal cavity size.  5. The left ventricle demonstrates regional wall motion abnormalities.  6. Global right ventricle has normal systolic function.The right ventricular size is normal.  7. Left atrial size was normal.  8. Right atrial size was normal.  9. The mitral valve is normal in structure. Mild mitral valve regurgitation. No evidence of mitral stenosis. 10. The tricuspid valve is normal in structure. 11. The aortic valve is tricuspid. Aortic valve regurgitation is not visualized. Mild aortic valve sclerosis without stenosis. 12. The pulmonic valve was not well visualized. Pulmonic valve regurgitation is trivial. 13. The inferior vena cava is dilated in size with >50% respiratory  variability, suggesting right atrial pressure of 8 mmHg. 14. Akinesis of the inferior, inferolateral, apical and distal septal walls; overall severe LV dysfunction; restrictive filling; mild LVE; mild MR. FINDINGS  Left Ventricle: Left ventricular ejection fraction, by visual estimation, is 25 to 30%. The left ventricle has severely decreased function. The left ventricle demonstrates regional wall motion abnormalities. The left ventricular internal cavity size was  mildly dilated left ventricle. There is no left ventricular hypertrophy. Left ventricular diastolic parameters are consistent with Grade III diastolic dysfunction (restrictive). Elevated left atrial pressure. Right Ventricle: The right ventricular size is normal.Global RV systolic function is has normal systolic function. Left Atrium: Left atrial size was normal in size. Right Atrium: Right atrial size was normal in size Pericardium: There is no evidence of pericardial effusion. Mitral Valve: The mitral valve is normal in structure. Mild mitral valve regurgitation. No evidence of mitral valve stenosis by observation. Tricuspid Valve: The tricuspid valve is normal in structure. Tricuspid valve regurgitation is trivial. Aortic Valve: The aortic valve is tricuspid. Aortic valve regurgitation is not visualized. Mild aortic valve sclerosis is present, with no evidence of aortic valve stenosis. Pulmonic Valve: The pulmonic valve was not well visualized. Pulmonic valve regurgitation is trivial. Pulmonic regurgitation is trivial. Aorta: The aortic root is normal in size and structure. Venous: The inferior vena cava is dilated in size with greater than 50% respiratory variability, suggesting right atrial pressure of 8 mmHg. IAS/Shunts: No atrial level shunt detected by color flow Doppler. Additional Comments: Akinesis of the inferior, inferolateral, apical and distal septal walls; overall severe LV dysfunction; restrictive filling; mild  LVE; mild MR.  LEFT  VENTRICLE PLAX 2D LVIDd:         5.80 cm       Diastology LVIDs:         4.60 cm       LV e' lateral:   5.33 cm/s LV PW:         0.90 cm       LV E/e' lateral: 22.3 LV IVS:        0.90 cm       LV e' medial:    3.68 cm/s LVOT diam:     2.00 cm       LV E/e' medial:  32.3 LV SV:         69 ml LV SV Index:   31.38 LVOT Area:     3.14 cm  LV Volumes (MOD) LV area d, A2C:    28.50 cm LV area d, A4C:    33.00 cm LV area s, A2C:    23.30 cm LV area s, A4C:    26.30 cm LV major d, A2C:   7.37 cm LV major d, A4C:   7.76 cm LV major s, A2C:   7.40 cm LV major s, A4C:   7.50 cm LV vol d, MOD A2C: 93.2 ml LV vol d, MOD A4C: 119.0 ml LV vol s, MOD A2C: 63.6 ml LV vol s, MOD A4C: 77.4 ml LV SV MOD A2C:     29.6 ml LV SV MOD A4C:     119.0 ml LV SV MOD BP:      37.6 ml RIGHT VENTRICLE RV S prime:     13.70 cm/s TAPSE (M-mode): 2.0 cm LEFT ATRIUM             Index       RIGHT ATRIUM           Index LA diam:        4.10 cm 1.93 cm/m  RA Area:     18.10 cm LA Vol (A2C):   60.5 ml 28.43 ml/m RA Volume:   49.30 ml  23.16 ml/m LA Vol (A4C):   68.4 ml 32.14 ml/m LA Biplane Vol: 64.2 ml 30.16 ml/m  AORTIC VALVE LVOT Vmax:   94.60 cm/s LVOT Vmean:  64.700 cm/s LVOT VTI:    0.149 m  AORTA Ao Root diam: 2.70 cm MITRAL VALVE MV Area (PHT): 3.99 cm              SHUNTS MV PHT:        55.10 msec            Systemic VTI:  0.15 m MV Decel Time: 190 msec              Systemic Diam: 2.00 cm MV E velocity: 119.00 cm/s 103 cm/s MV A velocity: 55.30 cm/s  70.3 cm/s MV E/A ratio:  2.15        1.5  Kirk Ruths MD Electronically signed by Kirk Ruths MD Signature Date/Time: 06/19/2019/3:17:27 PM    Final     Cardiac Studies   TTE: 06/19/19  IMPRESSIONS    1. Left ventricular ejection fraction, by visual estimation, is 25 to 30%. The left ventricle has severely decreased function. There is no left ventricular hypertrophy.  2. Elevated left atrial pressure.  3. Left ventricular diastolic parameters are consistent with Grade III  diastolic dysfunction (restrictive).  4. Mildly dilated left ventricular internal cavity size.  5. The left ventricle demonstrates regional  wall motion abnormalities.  6. Global right ventricle has normal systolic function.The right ventricular size is normal.  7. Left atrial size was normal.  8. Right atrial size was normal.  9. The mitral valve is normal in structure. Mild mitral valve regurgitation. No evidence of mitral stenosis. 10. The tricuspid valve is normal in structure. 11. The aortic valve is tricuspid. Aortic valve regurgitation is not visualized. Mild aortic valve sclerosis without stenosis. 12. The pulmonic valve was not well visualized. Pulmonic valve regurgitation is trivial. 13. The inferior vena cava is dilated in size with >50% respiratory variability, suggesting right atrial pressure of 8 mmHg. 14. Akinesis of the inferior, inferolateral, apical and distal septal walls; overall severe LV dysfunction; restrictive filling; mild LVE; mild MR.  Patient Profile     74 y.o. male presented with evidence of acute CVA and noted to have acute inferior ST elevation as well.    Assessment & Plan    1. Acute Stroke: large acute PCA infarct. Management per neurology. Being followed by PT/OT. Remains NPO and had several attempts at NGT. Planned for possible CoreTrak.   2. Inferior STEMI: still with persistent ST elevation in inferior leads. hsTn [place] at [address]. Initially on IV heparin, now transitioned to subq heparin. Treated with ASA. As above remains NPO but plan for statin. Plavix once ok with neurology. No BB, ACEi/ARB with permissive HTN.    3. ? Aspiration PNA: on antibiotics per primary   For questions or updates, please contact [place] Please consult [email or web address] for contact info under   Signed, Reino Bellis, NP  06/21/2019, 10:10 AM    Agree with note by Reino Bellis NP-C  Mr. Lunette Stands was admitted with a large acute PCA infarct as well as an inferior  STEMI with persistent inferior ST segment elevation initially on IV heparin now transition to subcu heparin.  He is on aspirin.  Plan to stop Plavix and aspirin with neurology.  Does have severe LV dysfunction with an EF of 20 to 25% range.  He is getting a swallowing study.  There is no plans for cardiac catheterization at this time.  We will continue to follow.  Lorretta Harp, M.D., [place], [facility], [place] [address] [place], [place]  [address]  [phone] 06/21/2019 11:19 AM

## 2019-06-22 ENCOUNTER — Inpatient Hospital Stay (HOSPITAL_COMMUNITY): Payer: Medicare Other

## 2019-06-22 DIAGNOSIS — R1312 Dysphagia, oropharyngeal phase: Secondary | ICD-10-CM

## 2019-06-22 DIAGNOSIS — E7800 Pure hypercholesterolemia, unspecified: Secondary | ICD-10-CM

## 2019-06-22 DIAGNOSIS — I5021 Acute systolic (congestive) heart failure: Secondary | ICD-10-CM

## 2019-06-22 DIAGNOSIS — I2111 ST elevation (STEMI) myocardial infarction involving right coronary artery: Secondary | ICD-10-CM

## 2019-06-22 LAB — CBC
HCT: 42.9 % (ref 39.0–52.0)
Hemoglobin: 13.5 g/dL (ref 13.0–17.0)
MCH: 31.3 pg (ref 26.0–34.0)
MCHC: 31.5 g/dL (ref 30.0–36.0)
MCV: 99.3 fL (ref 80.0–100.0)
Platelets: 310 10*3/uL (ref 150–400)
RBC: 4.32 MIL/uL (ref 4.22–5.81)
RDW: 13.2 % (ref 11.5–15.5)
WBC: 15.3 10*3/uL — ABNORMAL HIGH (ref 4.0–10.5)
nRBC: 0 % (ref 0.0–0.2)

## 2019-06-22 LAB — BASIC METABOLIC PANEL
Anion gap: 10 (ref 5–15)
BUN: 37 mg/dL — ABNORMAL HIGH (ref 8–23)
CO2: 21 mmol/L — ABNORMAL LOW (ref 22–32)
Calcium: 8.4 mg/dL — ABNORMAL LOW (ref 8.9–10.3)
Chloride: 114 mmol/L — ABNORMAL HIGH (ref 98–111)
Creatinine, Ser: 1.3 mg/dL — ABNORMAL HIGH (ref 0.61–1.24)
GFR calc Af Amer: >60 mL/min (ref >60)
GFR calc non Af Amer: 54 mL/min — ABNORMAL LOW (ref >60)
Glucose, Bld: 134 mg/dL — ABNORMAL HIGH (ref 70–99)
Potassium: 3.6 mmol/L (ref 3.5–5.1)
Sodium: 145 mmol/L (ref 135–145)

## 2019-06-22 LAB — GLUCOSE, CAPILLARY
Glucose-Capillary: 109 mg/dL — ABNORMAL HIGH (ref 70–99)
Glucose-Capillary: 116 mg/dL — ABNORMAL HIGH (ref 70–99)
Glucose-Capillary: 119 mg/dL — ABNORMAL HIGH (ref 70–99)
Glucose-Capillary: 123 mg/dL — ABNORMAL HIGH (ref 70–99)
Glucose-Capillary: 129 mg/dL — ABNORMAL HIGH (ref 70–99)
Glucose-Capillary: 131 mg/dL — ABNORMAL HIGH (ref 70–99)

## 2019-06-22 MED ORDER — OSMOLITE 1.5 CAL PO LIQD
1000.0000 mL | ORAL | Status: DC
  Administered 2019-06-22: 1000 mL
  Filled 2019-06-22 (×2): qty 1000

## 2019-06-22 MED ORDER — FUROSEMIDE 10 MG/ML IJ SOLN
40.0000 mg | Freq: Once | INTRAMUSCULAR | Status: AC
  Administered 2019-06-22: 40 mg via INTRAVENOUS
  Filled 2019-06-22: qty 4

## 2019-06-22 MED ORDER — LOPERAMIDE HCL 2 MG PO CAPS
2.0000 mg | ORAL_CAPSULE | Freq: Two times a day (BID) | ORAL | Status: DC
  Administered 2019-06-22 – 2019-06-26 (×7): 2 mg via ORAL
  Filled 2019-06-22 (×8): qty 1

## 2019-06-22 MED ORDER — LISINOPRIL 5 MG PO TABS
5.0000 mg | ORAL_TABLET | Freq: Every day | ORAL | Status: DC
  Administered 2019-06-22: 5 mg via ORAL
  Filled 2019-06-22 (×2): qty 1

## 2019-06-22 MED ORDER — CARVEDILOL 3.125 MG PO TABS
3.1250 mg | ORAL_TABLET | Freq: Two times a day (BID) | ORAL | Status: DC
  Administered 2019-06-22: 3.125 mg via ORAL
  Filled 2019-06-22: qty 1

## 2019-06-22 MED ORDER — OSMOLITE 1.5 CAL PO LIQD
1000.0000 mL | ORAL | Status: DC
  Administered 2019-06-23: 1000 mL
  Filled 2019-06-22 (×2): qty 1000

## 2019-06-22 NOTE — Progress Notes (Signed)
Hospitalist progress note   Levi Pruitt [id number] DOB: 30-May-1946 DOA: 06/18/2019  PCP: Patient, No Pcp Per   Narrative:  73 white male-known history of obesity BMI 32 presented code stroke 06/18/2019-CT = large acute PCA infarct EKG = ST elevation MI with reciprocal changes-patient started on heparin Cardiology and neurology consulted  Data Reviewed:  Sodium up from 1 41-145, bicarb up from 18-20-->21 BUN/creatinine 33/1.37-->37/1.30 White count down from 18.4-15.3, hemoglobin 13.5, platelets 307 Echocardiogram 06/19/2019 = EF 25-30% decreased function grade 3 diastolic dysfunction CXR [id number] cardiomegaly with vascular congestion Assessment & Plan: Large acute PCA infarct Significant deficits with dense hemiparesis/plegia on the left side n.p.o--continue with rectal aspirin--need DAPT once CORTRAK has been placed-will need further discussions with palliative care as below- Acute inferior STEMI Initially heparin- troponins trended up- cardiology did not feel he was a candidate for anything but medical interventions Heparin IV was discontinued on 11/10-needs repeat echocardiogram 1 week to determine if needs to be on anticoagulation Systolic, diastolic heart failure with pulmonary edema IV fluid was discontinued-was given Lasix-Was given Lasix x1 on 1/10 and 1/11 Probable aspiration pneumonia Continue Ceftriaxone and azithromycin at this time-would complete a total of 10 days Dysphagia status post placement of NG tube Very dense hemiplegia--recurrent aspiration risk and will need to d/w family goals of care--may need PEG tube Patient is having core track related diarrhea and I have started the patient on 2 mg my Imodium twice daily CKD stage II-III, mild hypernatremia, mild metabolic acidosis Aim to keep fluid balance even-has been given Lasix on 2 days successively Placed on D5 as not getting any p.o. nutrition and is becoming slightly hypernatremic and azotemic 8 mm parotid  neoplasm Outpatient follow-up if does well will need discussion about the same BMI >30  Prophylactic Lovenox, inpatient, no family at bedside and awaiting work-up Long discussion with the daughter [name] at the bedside-she is from [place] and the patient's current significant other [name] at [id number] makes medical decisions-they have an advanced care plan which states clearly that no artificial feeds or life prolonging measures should be used and this has been signed by the patient-I have asked palliative care to help delineate plans and goals as I think short-term parenteral feeds may be appropriate in terms of improvement but this is against what his wishes were on paper  Subjective:  More verbal however still seems quite sleepy Does not appear well but has verbalized better to the nurse and was able to say his name Dense deficits on the right side persist   Objective: Vitals:   06/21/19 1649 06/21/19 2044 06/22/19 0058 06/22/19 0401  BP: 131/79 140/75 125/61 123/79  Pulse: 73 72 70 71  Resp: (!) 24 (!) 24 (!) 27 (!) 27  Temp: 98.5 F (36.9 C) 98 F (36.7 C) 98 F (36.7 C) (!) 97.2 F (36.2 C)  TempSrc: Axillary Axillary Axillary Axillary  SpO2: 100% 95% 96% 94%  Weight:      Height:        Intake/Output Summary (Last 24 hours) at 06/22/2019 0726 Last data filed at 06/22/2019 [id number] Gross per 24 hour  Intake 1940.76 ml  Output 400 ml  Net 1540.76 ml   Filed Weights   06/19/19 0624 06/20/19 0323 06/21/19 0355  Weight: 97.3 kg 104 kg 99.9 kg    Examination:  Awake coherent to person NG tube in place Facial twisting to the right Dense paresis plegia right side Patient laying in stool Abdomen soft nontender S1-S2 no murmur Telemetry  first-degree AV block  Scheduled Meds: .  stroke: mapping our early stages of recovery book   Does not apply Once  . aspirin  81 mg Per Tube Daily  . atorvastatin  80 mg Oral q1800  . clopidogrel  75 mg Per Tube Daily  . feeding  supplement (PRO-STAT SUGAR FREE 64)  30 mL Per Tube BID  . feeding supplement (VITAL HIGH PROTEIN)  1,000 mL Per Tube Q24H  . furosemide  40 mg Intravenous Once  . heparin  5,000 Units Subcutaneous Q8H   Continuous Infusions: . azithromycin 500 mg (06/21/19 1444)  . cefTRIAXone (ROCEPHIN)  IV 1 g (06/21/19 1246)     LOS: 4 days   Time spent: [place], MD [facility]  06/22/2019, 7:26 AM

## 2019-06-22 NOTE — Progress Notes (Signed)
Nutrition Note  RD consulted for TF initiation and management following Cortrak tube placement via IR.   Placed orders based on recommendations in initial assessment 1/11.   -Osmolite 1.5 @ 20 ml/hr, advance by 10 ml every 4 hours to goal rate of 60 ml/hr.  RD will continue to monitor.  If other nutrition issues arise, please consult RD.   Clayton Bibles, MS, RD, LDN Inpatient Clinical Dietitian Pager: [phone] After Hours Pager: [phone]

## 2019-06-22 NOTE — Plan of Care (Signed)
  Problem: Clinical Measurements: Goal: Ability to maintain clinical measurements within normal limits will improve Outcome: Progressing   Problem: Nutrition: Goal: Adequate nutrition will be maintained Outcome: Progressing   Problem: Clinical Measurements: Goal: Respiratory complications will improve Outcome: Not Progressing

## 2019-06-22 NOTE — Evaluation (Signed)
Speech Language Pathology Evaluation Patient Details Name: Levi Pruitt MRN: [id number] DOB: 1946/03/13 Today's Date: 06/22/2019 Time: [id number] SLP Time Calculation (min) (ACUTE ONLY): 15 min  Problem List:  Patient Active Problem List   Diagnosis Date Noted  . ST elevation myocardial infarction (STEMI) ([place])   . LV dysfunction   . 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 Abnormal ECG 06/18/2019  . Acute ischemic stroke ([place]) 06/18/2019  . Parotid mass 06/18/2019  . AKI (acute kidney injury) ([place]) 06/18/2019  . Acute ST elevation myocardial infarction (STEMI) of inferior wall [facility])    Past Medical History:  Past Medical History:  Diagnosis Date  . 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 No pertinent surgical history. HPI:   74 year old Caucasian male, obese, with no other documented past medical history. Patient is not able to provide and history.  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   Assessment / Plan / Recommendation Clinical Impression  Pt was seen for a cognitive-linguistic evaluation in the setting of a large acute left PCA infarct.  Pt's daughter was present for this evaluation and she was able to provide information about the pt's cognitive-linguistic baseline.  She reported that the pt was fully independent with ADLs and IADLs and that he worked full time as an Forensic psychologist prior to admission.   Pt was limited by lethargy throughout this evaluation and it was therefore abbreviated, but he presents with aphasia c/b expressive language deficits > receptive language deficits to limited evaluation.   Pt followed basic 1 and 2 step commands with 100% accuracy given extra time, but he  exhibited more difficulty with 3 step commands.  He was oriented to himself via yes/no questions and he answered one additional yes/no question accurately.  Pt was unable to name items in the room independently, but his accuracy increased given mod-max semantic and phonemic cues.  Cognition was not assessed at this time secondary to language deficits and lethargy.  Additionally, pt presents with significant dysarthria with reduced intelligibility at the word level.  Recommend intensive ST (Inpatient Rehab) at time of discharge.  SLP will f/u for diagnostic treatment when pt is more alert.     SLP Assessment  SLP Recommendation/Assessment: Patient needs continued Speech Lanaguage Pathology Services SLP Visit Diagnosis: Aphasia (R47.01);Dysarthria and anarthria (R47.1)    Follow Up Recommendations  Inpatient Rehab    Frequency and Duration min 2x/week  2 weeks      SLP Evaluation Cognition  Overall Cognitive Status: Impaired/Different from baseline Arousal/Alertness: Lethargic Orientation Level: Oriented to person Comments: Difficult to assess due to lethargy and language deficits        Comprehension  Auditory Comprehension Yes/No Questions: Not tested Commands: Impaired One Step Basic Commands: 75-100% accurate Two Step Basic Commands: 75-100% accurate Multistep Basic Commands: 50-74% accurate Conversation: Simple    Expression Expression Primary Mode of Expression: Verbal Verbal Expression Overall Verbal Expression: Impaired Automatic Speech: Name Level of Generative/Spontaneous Verbalization: Word Repetition: Impaired Level of Impairment: Word level Naming: Impairment Confrontation: Impaired Convergent: 25-49% accurate Effective Techniques: Phonemic cues;Sentence completion   Oral / Motor  Oral Motor/Sensory Function Overall Oral Motor/Sensory Function: Moderate impairment Facial ROM: Suspected CN VII (facial) dysfunction;Reduced right Facial Symmetry: Abnormal  symmetry right;Suspected CN VII (  facial) dysfunction Facial Strength: Reduced right;Suspected CN VII (facial) dysfunction Facial Sensation: Reduced right;Suspected CN V (Trigeminal) dysfunction Lingual ROM: Reduced right;Suspected CN XII (hypoglossal) dysfunction Lingual Symmetry: Abnormal symmetry right Motor Speech Overall Motor Speech: Impaired Respiration: Within functional limits Phonation: Low vocal intensity Articulation: Impaired Level of Impairment: Word Intelligibility: Intelligibility reduced Word: 25-49% accurate                      Colin Mulders M.S., CCC-SLP Acute Rehabilitation Services Office: [phone]  Elvia Collum [facility] 06/22/2019, 2:40 PM

## 2019-06-22 NOTE — Progress Notes (Signed)
Progress Note  Patient Name: Levi Pruitt Date of Encounter: 06/22/2019  Primary Cardiologist: No primary care provider on file.  Subjective   Patient lying in bed in restraints.  He does have a feeding tube in place.  Inpatient Medications    Scheduled Meds: .  stroke: mapping our early stages of recovery book   Does not apply Once  . aspirin  81 mg Per Tube Daily  . atorvastatin  80 mg Oral q1800  . clopidogrel  75 mg Per Tube Daily  . furosemide  40 mg Intravenous Once  . heparin  5,000 Units Subcutaneous Q8H   Continuous Infusions: . azithromycin 500 mg (06/21/19 1444)  . cefTRIAXone (ROCEPHIN)  IV 1 g (06/21/19 1246)  . feeding supplement (OSMOLITE 1.5 CAL)     PRN Meds: acetaminophen **OR** acetaminophen (TYLENOL) oral liquid 160 mg/5 mL **OR** acetaminophen, ipratropium-albuterol   Vital Signs    Vitals:   06/21/19 2044 06/22/19 0058 06/22/19 0401 06/22/19 0816  BP: 140/75 125/61 123/79 118/76  Pulse: 72 70 71 73  Resp: (!) 24 (!) 27 (!) 27 (!) 22  Temp: 98 F (36.7 C) 98 F (36.7 C) (!) 97.2 F (36.2 C) 97.6 F (36.4 C)  TempSrc: Axillary Axillary Axillary Axillary  SpO2: 95% 96% 94% 93%  Weight:      Height:        Intake/Output Summary (Last 24 hours) at 06/22/2019 1011 Last data filed at 06/22/2019 0626 Gross per 24 hour  Intake 1940.76 ml  Output 400 ml  Net 1540.76 ml   Last 3 Weights 06/21/2019 06/20/2019 06/19/2019  Weight (lbs) 220 lb 4.8 oz 229 lb 3.2 oz 214 lb 8 oz  Weight (kg) 99.927 kg 103.964 kg 97.297 kg      Telemetry    SR - Personally Reviewed  ECG    Not performed today  Physical Exam   Older WM, sitting up in bed. Does not answer questions at the moment.  Disoriented GEN: No acute distress.   Neck: No JVD Cardiac: RRR, no murmurs, rubs, or gallops.  Respiratory: Clear to auscultation bilaterally. GI: Soft, nontender, non-distended  MS: No edema; No deformity. Neuro:  Weak on right side.  Dysarthric Psych: Normal  affect   Labs    High Sensitivity Troponin:   Recent Labs  Lab 06/18/19 1531 06/18/19 1828 06/18/19 2212 06/19/19 0000  TROPONINIHS 12,799* 12,437* 10,883* 8,791*      Chemistry Recent Labs  Lab 06/18/19 1439 06/19/19 0422 06/20/19 0407 06/20/19 1054 06/21/19 0246 06/22/19 0221  NA 138 139 141  --  146* 145  K 3.7 4.0 4.0  --  3.8 3.6  CL 105 107 112*  --  114* 114*  CO2 20* 20* 18*  --  20* 21*  GLUCOSE 102* 96 100*  --  109* 134*  BUN 43* 38* 33*  --  32* 37*  CREATININE 1.66* 1.38* 1.37* 1.49* 1.47* 1.30*  CALCIUM 8.5* 8.3* 8.0*  --  8.4* 8.4*  PROT 6.7 6.6  --   --   --   --   ALBUMIN 2.9* 2.7*  --   --  2.4*  --   AST 73* 63*  --   --   --   --   ALT 60* 59*  --   --   --   --   ALKPHOS 69 68  --   --   --   --   BILITOT 0.9 1.1  --   --   --   --  GFRNONAA 40* 50* 51* 46* 47* 54*  GFRAA 47* 58* 59* 53* 54* >60  ANIONGAP 13 12 11   --  12 10     Hematology Recent Labs  Lab 06/20/19 1054 06/21/19 0246 06/22/19 0221  WBC 16.6* 16.4* 15.3*  RBC 4.07* 4.16* 4.32  HGB 12.8* 13.0 13.5  HCT 40.3 40.1 42.9  MCV 99.0 96.4 99.3  MCH 31.4 31.3 31.3  MCHC 31.8 32.4 31.5  RDW 13.2 13.3 13.2  PLT 273 307 310    BNP Recent Labs  Lab 06/19/19 1515  BNP 756.3*     DDimer No results for input(s): DDIMER in the last 168 hours.   Radiology    DG Abd 1 View  Result Date: 06/21/2019 CLINICAL DATA:  74 year old male with dysphagia. EXAM: ABDOMEN - 1 VIEW COMPARISON:  None. FINDINGS: Single view fluoroscopic image of the upper abdomen provided. An enteric tube is noted with tip in the distal duodenum. Contrast injected via the tube opacifies distal duodenum. IMPRESSION: Enteric tube with tip in the distal duodenum. Electronically Signed   By: Anner Crete M.D.   On: 06/21/2019 16:55   DG CHEST PORT 1 VIEW  Result Date: 06/22/2019 CLINICAL DATA:  Shortness of breath. EXAM: PORTABLE CHEST 1 VIEW COMPARISON:  June 20, 2019 FINDINGS: Feeding tube tip is  below the diaphragm. No pneumothorax. There is cardiomegaly with pulmonary vascular congestion. There is hazy alveolar opacity bilaterally in a predominantly perihilar distribution. There is also mild interstitial thickening. There is an equivocal left pleural effusion. No adenopathy. No bone lesions. IMPRESSION: Cardiomegaly with pulmonary vascular congestion. Perihilar alveolar opacity, likely alveolar edema. There may be mild interstitial edema superimposed. The overall appearance is most indicative of congestive heart failure. A degree of atypical organism pneumonia superimposed cannot be excluded given the areas of perihilar airspace opacity. Feeding tube tip is below the diaphragm. Electronically Signed   By: Lowella Grip III M.D.   On: 06/22/2019 07:46   DG CHEST PORT 1 VIEW  Result Date: 06/20/2019 CLINICAL DATA:  Patient admitted with a stroke. EXAM: PORTABLE CHEST 1 VIEW COMPARISON:  June 19, 2019 FINDINGS: Persistent cardiomegaly and vascular congestion. The vascular congestion has worsened. No other interval changes. IMPRESSION: Cardiomegaly and worsening vascular congestion/mild edema. Electronically Signed   By: Dorise Bullion III M.D   On: 06/20/2019 18:10   DG Swallowing Func-Speech Pathology  Result Date: 06/21/2019 Objective Swallowing Evaluation: Type of Study: MBS-Modified Barium Swallow Study  Patient Details Name: Levi Pruitt MRN: [id number] Date of Birth: [birth year] Today's Date: 06/21/2019 Time: SLP Start Time (ACUTE ONLY): 1100 -SLP Stop Time (ACUTE ONLY): 1128 SLP Time Calculation (min) (ACUTE ONLY): 28 min Past Medical History: Past Medical History: Diagnosis Date . 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 HPI:  74 year old Caucasian male, obese, with no other documented past medical history. Patient is not able to provide and history.  Patient was admitted with acute large left PCA infarct with  right sided hemiplegia/paresis/speech problems and small acute left frontal lobe infarct. 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  Subjective: lethargic, arousable with multimodal cues Assessment / Plan / Recommendation CHL IP CLINICAL IMPRESSIONS 06/21/2019 Clinical Impression MBSS reveals moderate oral dysphagia, mild to moderate pharyngeal dyshpagia suspected to be of multifactorial etiology; neurogenic and cognitive decline post CVA. Pt required maximal cueing for improving alertness throughout MBSS.  Oral deficits include right  sided oral motor deficits including reduced lingual coordination, oral holding, intermittent right sided anterior spillage, right sided buccal pocketing, and delayed oral transit. Thin liquid by teaspoon bolus, extracted from oral cavity after prolonged oral holding despite cues. Oral suction utilized post MBSS to assist with removal of oral residuals.  Pharyngeal deficits c/b delay in swallow initiation, decreased timely epiglottic inversion, reduced base of tongue retraction and decreased laryngeal elevation. This allowed for intermittent pre swallow penetration of nectar thick liquids, intermittent during the swallow penetration of honey thick liquids by cup. Despite no aspiration visualized on MBSS this date, risk remains significantly increased due to fluctuation in patients mentation and deficits noted on exam. Recommend continue NPO with short term alternative nutrition with PO trials with SLP (nectar thick, honey thick, and puree POs) and hopes of diet advancement. SLP to follow up.   SLP Visit Diagnosis Dysphagia, oropharyngeal phase (R13.12) Attention and concentration deficit following -- Frontal lobe and executive function deficit following -- Impact on safety and function Moderate aspiration risk;Severe aspiration risk;Risk for inadequate nutrition/hydration   CHL IP TREATMENT RECOMMENDATION 06/21/2019 Treatment Recommendations  Therapy as outlined in treatment plan below   Prognosis 06/21/2019 Prognosis for Safe Diet Advancement Fair Barriers to Reach Goals Time post onset Barriers/Prognosis Comment -- CHL IP DIET RECOMMENDATION 06/21/2019 SLP Diet Recommendations Alternative means - temporary Liquid Administration via -- Medication Administration Via alternative means Compensations -- Postural Changes --   CHL IP OTHER RECOMMENDATIONS 06/21/2019 Recommended Consults -- Oral Care Recommendations Oral care QID Other Recommendations --   CHL IP FOLLOW UP RECOMMENDATIONS 06/21/2019 Follow up Recommendations Inpatient Rehab;Skilled Nursing facility   [facility] IP FREQUENCY AND DURATION 06/21/2019 Speech Therapy Frequency (ACUTE ONLY) min 2x/week Treatment Duration 2 weeks      CHL IP ORAL PHASE 06/21/2019 Oral Phase Impaired Oral - Pudding Teaspoon -- Oral - Pudding Cup -- Oral - Honey Teaspoon -- Oral - Honey Cup Weak lingual manipulation;Right anterior bolus loss;Reduced posterior propulsion;Incomplete tongue to palate contact;Holding of bolus;Lingual/palatal residue;Right pocketing in lateral sulci;Delayed oral transit Oral - Nectar Teaspoon -- Oral - Nectar Cup Right anterior bolus loss;Weak lingual manipulation;Incomplete tongue to palate contact;Reduced posterior propulsion;Holding of bolus;Right pocketing in lateral sulci;Pocketing in anterior sulcus;Lingual/palatal residue;Delayed oral transit Oral - Nectar Straw -- Oral - Thin Teaspoon Holding of bolus;Right anterior bolus loss Oral - Thin Cup -- Oral - Thin Straw -- Oral - Puree Delayed oral transit;Lingual/palatal residue;Weak lingual manipulation;Incomplete tongue to palate contact;Reduced posterior propulsion;Decreased bolus cohesion Oral - Mech Soft -- Oral - Regular -- Oral - Multi-Consistency -- Oral - Pill -- Oral Phase - Comment --  CHL IP PHARYNGEAL PHASE 06/21/2019 Pharyngeal Phase Impaired Pharyngeal- Pudding Teaspoon -- Pharyngeal -- Pharyngeal- Pudding Cup -- Pharyngeal --  Pharyngeal- Honey Teaspoon -- Pharyngeal -- Pharyngeal- Honey Cup Delayed swallow initiation-pyriform sinuses;Reduced anterior laryngeal mobility;Reduced airway/laryngeal closure;Reduced tongue base retraction;Penetration/Aspiration during swallow;Pharyngeal residue - valleculae;Pharyngeal residue - pyriform;Reduced epiglottic inversion Pharyngeal Material does not enter airway;Material enters airway, remains ABOVE vocal cords then ejected out Pharyngeal- Nectar Teaspoon -- Pharyngeal -- Pharyngeal- Nectar Cup Delayed swallow initiation-pyriform sinuses;Reduced epiglottic inversion;Reduced airway/laryngeal closure;Reduced tongue base retraction;Penetration/Aspiration before swallow Pharyngeal Material does not enter airway;Material enters airway, remains ABOVE vocal cords and not ejected out Pharyngeal- Nectar Straw -- Pharyngeal -- Pharyngeal- Thin Teaspoon Other (Comment) Pharyngeal -- Pharyngeal- Thin Cup -- Pharyngeal -- Pharyngeal- Thin Straw -- Pharyngeal -- Pharyngeal- Puree Delayed swallow initiation-vallecula;Reduced tongue base retraction;Pharyngeal residue - valleculae;Reduced anterior laryngeal mobility;Reduced laryngeal elevation;Pharyngeal residue - pyriform Pharyngeal -- Pharyngeal-  Mechanical Soft -- Pharyngeal -- Pharyngeal- Regular -- Pharyngeal -- Pharyngeal- Multi-consistency -- Pharyngeal -- Pharyngeal- Pill -- Pharyngeal -- Pharyngeal Comment --  CHL IP CERVICAL ESOPHAGEAL PHASE 06/21/2019 Cervical Esophageal Phase WFL Pudding Teaspoon -- Pudding Cup -- Honey Teaspoon -- Honey Cup -- Nectar Teaspoon -- Nectar Cup -- Nectar Straw -- Thin Teaspoon -- Thin Cup -- Thin Straw -- Puree -- Mechanical Soft -- Regular -- Multi-consistency -- Pill -- Cervical Esophageal Comment -- Chelsea E Hartness MA, CCC-SLP Acute Rehabilitation Services 06/21/2019, 12:00 PM               Cardiac Studies   TTE: 06/19/19  IMPRESSIONS    1. Left ventricular ejection fraction, by visual estimation, is 25 to 30%.  The left ventricle has severely decreased function. There is no left ventricular hypertrophy.  2. Elevated left atrial pressure.  3. Left ventricular diastolic parameters are consistent with Grade III diastolic dysfunction (restrictive).  4. Mildly dilated left ventricular internal cavity size.  5. The left ventricle demonstrates regional wall motion abnormalities.  6. Global right ventricle has normal systolic function.The right ventricular size is normal.  7. Left atrial size was normal.  8. Right atrial size was normal.  9. The mitral valve is normal in structure. Mild mitral valve regurgitation. No evidence of mitral stenosis. 10. The tricuspid valve is normal in structure. 11. The aortic valve is tricuspid. Aortic valve regurgitation is not visualized. Mild aortic valve sclerosis without stenosis. 12. The pulmonic valve was not well visualized. Pulmonic valve regurgitation is trivial. 13. The inferior vena cava is dilated in size with >50% respiratory variability, suggesting right atrial pressure of 8 mmHg. 14. Akinesis of the inferior, inferolateral, apical and distal septal walls; overall severe LV dysfunction; restrictive filling; mild LVE; mild MR.  Patient Profile     74 y.o. male presented with evidence of acute CVA and noted to have acute inferior ST elevation as well.    Assessment & Plan    1. Acute Stroke: large acute PCA infarct. Management per neurology. Being followed by PT/OT.  Patient has a feeding tube in place  2. Inferior STEMI: still with persistent ST elevation in inferior leads. hsTn peaked at [address]. Initially on IV heparin, now transitioned to subq heparin. Treated with ASA. As above remains NPO but does have a feeding tube in place.  He is on aspirin Plavix as well as statin therapy.  3. ? Aspiration PNA: on antibiotics per primary  4: Ischemic cardiomyopathy-EF 25 to 30% by 2D echo performed 06/19/2019.  Optimally he should be on beta-blocker, and ACE inhibition.   His renal function is marginal.  There is no plan for ischemic evaluation at this time.  His blood pressures in the 130/90 range.  I understand that we are allowing permissive hypertension per neurology.  Once the allow normotension can put on low-dose carvedilol and ACE inhibitor.  At this point, we have nothing further to offer.  We will sign off but be available for further questions if necessary.   For questions or updates, please contact [place] Please consult [email or web address] for contact info under   Signed, Quay Burow, MD  06/22/2019, 10:11 AM    10:11 AM

## 2019-06-22 NOTE — Progress Notes (Addendum)
  Speech Language Pathology Treatment: Dysphagia  Patient Details Name: Levi Pruitt MRN: [id number] DOB: June 04, 1946 Today's Date: 06/22/2019 Time: [id number] SLP Time Calculation (min) (ACUTE ONLY): 15 min  Assessment / Plan / Recommendation Clinical Impression  Pt was seen for skilled ST targeting dysphagia tx.  Pt was encountered asleep in bed with cortrak in place and daughter at bedside.  He intermittently roused to max verbal and tactile cues, but he remained lethargic throughout this tx session. Spoke with daughter in depth regarding MBS results/recommendations, importance of oral care, and the pt's speech therapy goals.  She verbalized understanding.  SLP completed oral care with suction swab prior to PO trials.  Pt was seen with minimal trials of nectar-thick liquid and honey-thick liquid via tsp.  Pt attempted labial closure around the spoon during liquid trials with some R anterior labial spillage observed.  Pt exhibited minimal lingual manipulation of both bolus trials and no swallow initiation was observed.  Boluses were suctioned from the pt's oral cavity secondary to aspiration risk.  Suspect that pt will have better tolerance of PO trials when he is more alert.  Recommend continuation of NPO with frequent oral care and short-term alternative means of nutrition at this time.  SLP will continue to f/u per POC.      HPI HPI:  74 year old Caucasian male, obese, with no other documented past medical history. Patient is not able to provide and history.  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      SLP Plan  Continue with current plan of care  Patient needs continued Speech Lanaguage Pathology Services    Recommendations  Diet recommendations: NPO Medication Administration: Via alternative  means                Oral Care Recommendations: Oral care QID;Staff/trained caregiver to provide oral care Follow up Recommendations: Inpatient Rehab SLP Visit Diagnosis: Aphasia (R47.01);Dysarthria and anarthria (R47.1) Plan: Continue with current plan of care       [place]., M.S., [place] Office: [phone]  [place] 06/22/2019, 2:39 PM

## 2019-06-22 NOTE — Progress Notes (Signed)
STROKE TEAM PROGRESS NOTE   INTERVAL HISTORY Patient daughter at the bedside. Patient lying in bed, wheezing with shortness of breath. On tube feeding at 60 cc/h, free water 100 cc every 4 hours, and KVO normal saline 10 cc/h. Concerning for pulmonary edema again, give Lasix 40, decrease tube feeding to 30 cc, DC free water, continue KVO at 10.  OBJECTIVE Vitals:   06/22/19 0058 06/22/19 0401 06/22/19 0816 06/22/19 1120  BP: 125/61 123/79 118/76 117/63  Pulse: 70 71 73 67  Resp: (!) 27 (!) 27 (!) 22 (!) 24  Temp: 98 F (36.7 C) (!) 97.2 F (36.2 C) 97.6 F (36.4 C) 98 F (36.7 C)  TempSrc: Axillary Axillary Axillary Axillary  SpO2: 96% 94% 93% 93%  Weight:      Height:        CBC:  Recent Labs  Lab 06/18/19 1439 06/18/19 1447 06/21/19 0246 06/22/19 0221  WBC 17.3*   < > 16.4* 15.3*  NEUTROABS 13.8*  --  13.3*  --   HGB 13.9  --  13.0 13.5  HCT 42.7  --  40.1 42.9  MCV 97.3   < > 96.4 99.3  PLT 243   < > 307 310   < > = values in this interval not displayed.    Basic Metabolic Panel:  Recent Labs  Lab 06/20/19 0407 06/21/19 0246 06/22/19 0221  NA 141 146* 145  K 4.0 3.8 3.6  CL 112* 114* 114*  CO2 18* 20* 21*  GLUCOSE 100* 109* 134*  BUN 33* 32* 37*  CREATININE 1.37* 1.47* 1.30*  CALCIUM 8.0* 8.4* 8.4*  MG 2.6* 2.4  --   PHOS  --  4.0  --     Lipid Panel:     Component Value Date/Time   CHOL 152 06/19/2019 0422   TRIG 107 06/19/2019 0422   HDL 32 (L) 06/19/2019 0422   CHOLHDL 4.8 06/19/2019 0422   VLDL 21 06/19/2019 0422   LDLCALC 99 06/19/2019 0422   HgbA1c:  Lab Results  Component Value Date   HGBA1C 5.3 06/19/2019   Urine Drug Screen:     Component Value Date/Time   LABOPIA NONE DETECTED 06/19/2019 1420   COCAINSCRNUR NONE DETECTED 06/19/2019 1420   LABBENZ NONE DETECTED 06/19/2019 1420   AMPHETMU NONE DETECTED 06/19/2019 1420   THCU NONE DETECTED 06/19/2019 1420   LABBARB NONE DETECTED 06/19/2019 1420    Alcohol Level No results  found for: [facility]  IMAGING  CT HEAD CODE STROKE WO CONTRAST 06/18/2019 IMPRESSION:  1. Large acute Left PCA territory infarct. No associated hemorrhage. Minor mass effect.  2. No acute anterior circulation infarct is evident (ASPECTS 10). 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   CT Code Stroke CTA Head W/WO contrast CT Code Stroke CTA Neck W/WO contrast CT Code Stroke Cerebral Perfusion with contrast 06/18/2019 IMPRESSION:  1. Positive for acute appearing occlusions of the distal Left Vertebral Artery and Left PCA: - distal left V4 occlusion, with upstream multifocal moderate and severe left vertebral stenosis in the neck. - left PCA P1 occlusion just beyond the origin.  2. CTP detects only some of the left PCA territory core infarct which was visible by plain CT.  3. 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 The right vertebral artery and basilar arteries are patent without stenosis.  4. Positive also for: - High-grade stenosis of the Right ICA bulb approaching a radiographic string sign, - High-grade stenosis of both ICA siphons due to  advanced calcified plaque, -60% proximal left ICA stenosis.  5. Mild to moderate irregularity of the bilateral MCA branches.  6. Small bilateral pleural effusions with increased pulmonary septal thickening suggestive of pulmonary edema.  7. 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 Recommend follow-up with ENT.   MRI Brain WO Contrast 1. Large acute left PCA infarct. 2. Small acute left frontal lobe infarct. 3. 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  CXR Chest Single View 06/22/2019 IMPRESSION:  Cardiomegaly with pulmonary vascular congestion. Perihilar alveolar opacity, likely alveolar edema. There may be mild interstitial edema superimposed. The overall appearance is most indicative of congestive heart failure. A degree of atypical organism pneumonia superimposed cannot be excluded given the areas of  perihilar airspace opacity. 06/20/2019 IMPRESSION:  Cardiomegaly and worsening vascular congestion/mild edema. Feeding tube tip is below the diaphragm. 06/19/2019 IMPRESSION:  Cardiomegaly with vascular congestion. Left basilar atelectasis/pneumonia.  06/18/2019 IMPRESSION:  1. Mild cardiomegaly with central vascular congestion and small pleural effusions.  2. Patchy airspace disease in the left infrahilar lung, atelectasis versus pneumonia.   Transthoracic Echocardiogram  06/19/2019 IMPRESSIONS:  1. Left ventricular ejection fraction, by visual estimation, is 25 to 30%. The left ventricle has severely decreased function. There is no left ventricular hypertrophy.  2. Elevated left atrial pressure.  3. Left ventricular diastolic parameters are consistent with Grade III diastolic dysfunction (restrictive).  4. Mildly dilated left ventricular internal cavity size.  5. The left ventricle demonstrates regional wall motion abnormalities.  6. Global right ventricle has normal systolic function.The right ventricular size is normal.  7. Left atrial size was normal.  8. Right atrial size was normal.  9. The mitral valve is normal in structure. Mild mitral valve regurgitation. No evidence of mitral stenosis. 10. The tricuspid valve is normal in structure. 11. The aortic valve is tricuspid. Aortic valve regurgitation is not visualized. Mild aortic valve sclerosis without stenosis. 12. The pulmonic valve was not well visualized. Pulmonic valve regurgitation is trivial. 13. The inferior vena cava is dilated in size with >50% respiratory variability, suggesting right atrial pressure of 8 mmHg. 14. Akinesis of the inferior, inferolateral, apical and distal septal walls; overall severe LV dysfunction; restrictive filling; mild LVE; mild MR   PHYSICAL EXAM  Temp:  [97.2 F (36.2 C)-98.5 F (36.9 C)] 98 F (36.7 C) (01/12 1120) Pulse Rate:  [67-73] 67 (01/12 1120) Resp:  [22-27] 24 (01/12 1120) BP:  (117-140)/(61-79) 117/63 (01/12 1120) SpO2:  [93 %-100 %] 93 % (01/12 1120)  General - Well nourished, well developed, lethargic.  Ophthalmologic - fundi not visualized due to noncooperation.  Cardiovascular - Regular rhythm and rate.  Neuro - lethargic but able to open eyes on voice.  Did not follow simple commands today, nonverbal due to lethargy.  PERRL, more left gaze preference, right hemianopia, not blinking to visual threat on the right, but able to blink to visual threat on the left.  Right facial droop, tongue midline.  Right upper and lower extremity flaccid, only slight withdrawal on the right lower extremity.  Left upper extremity at least 4/5, but did not follow commands on left hand.  Left lower extremity 3-/5.  DTR 1+, no Babinski.  Sensation and coordination not corporative.  Gait not tested.   ASSESSMENT/PLAN Mr. Khang Holeman is a 74 y.o. male with no known significant past medical history presenting as a code stroke with c/o slurred speech, right arm flaccid. Cardiology consult for STEMI.  He did not receive IV t-PA  due to late presentation (>4.5 hours from time of onset).  Stroke: Large left PCA territory acute infarct - embolic patter - unknown source, could be related to STEMI or large vessel atherosclerosis.  Resultant right hemianopia, right hemiplegia, right facial droop, severe dysarthria  Code Stroke CT Head - Large acute Left PCA territory infarct. No associated hemorrhage. Minor mass effect. No acute anterior circulation infarct is evident (ASPECTS 10). 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   MRI head - large acute left PCA infarct, small acute left frontal lobe infarct, chronic right temporal and right occipital lobe infarcts  CTA H&N - acute occlusions of the distal Left V4 and Left P1. Chronic occlusion of the right PCA. 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   2D Echo - EF 25 - 30%. Overall severe LV dysfunction.   Sars Corona Virus 2 - negative  LDL - 99  HgbA1c 5.3  UDS - negative  VTE prophylaxis - Heparin subq  No antithrombotic prior to admission, was on aspirin 81 mg daily and heparin IV. Now on ASA 81 and plavix DAPT. Will repeat TTE in one week, if EF still < 30-35%, will recommend anticoagulation at that time.   Therapy recommendations:  CIR  Disposition:  Pending  STEMI - inferior wall  Troponin -12,799-> 12,437-> 10,883-> 8791  EKG - II, III, aVF STT elevation  EF 25 - 30%. Overall severe LV dysfunction.  Cardiology on board  On aspirin PR  Heparin IV was discontinued by cardiology  Will repeat TTE in one week, if EF still < 30-35%, will recommend anticoagulation at that time.  EKG concerning for inferior leads MI  Acute pulmonary edema  CXR - Small bilateral pleural effusions with increased pulmonary septal thickening suggestive of pulmonary edema.   Intermittent wheezing 06/19/19, s/p lasix  KVO IVF @ 10 and on cortrak with TF @ 60 with FW 100 Q4 -> decrease TF to 30 and d/c FW  Lasix 40 once  CXR 06/22/19 Cardiomegaly with pulmonary vascular congestion. The overall appearance is most indicative of congestive heart failure.  CXR repeat in am  Aspiration pneumonia versus pneumonitis  Severe dysarthria and difficulty handling oral secretions  Intermittent wheezing  Leukocytosis -WBC 17.3 ->15.7->16.6->16.4->15.3   On Rocephin and azithromycin (started 06/19/19)  Treatment per primary team  Cardiomyopathy / CHF  EF 25-30%  Cardiology recommend low dose coreg and ACE . Put on coreg 3.125 bid and lisinopril 5mg  . Long-term BP goal normotensive  Hyperlipidemia  Home Lipid lowering medication: none   LDL 99, goal < 70  Current lipid lowering medication: Lipitor 80 mg daily   Continue statin at discharge  AKI vs. CKD  Cre 1.66  ->1.38->1.49->1.47->1.30  On TF @ 30cc  KVO NS @ 10  BMP monitoring   Dysphagia . Secondary to stroke . NPO . Attempted NG placement failed at bedside . Cortrak place under fluoro . On TF @ 30cc . 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 Recommend follow-up with ENT.   Small bilateral pleural effusions with increased pulmonary septal thickening suggestive of pulmonary edema.   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 This patient's care requiresreview of multiple databases, neurological assessment, discussion with family, other specialists and medical decision making of high complexity. I had long discussion with daughter at bedside, updated pt current condition, treatment plan and potential prognosis, and answered all the questions.  Daughter expressed understanding and appreciation.   Rosalin Hawking, MD PhD Stroke Neurology 06/22/2019 3:13 PM   To contact Stroke Continuity provider, please refer to [email or web address] After hours, contact General Neurology

## 2019-06-23 ENCOUNTER — Inpatient Hospital Stay (HOSPITAL_COMMUNITY): Payer: Medicare Other

## 2019-06-23 DIAGNOSIS — R1310 Dysphagia, unspecified: Secondary | ICD-10-CM

## 2019-06-23 DIAGNOSIS — Z7189 Other specified counseling: Secondary | ICD-10-CM

## 2019-06-23 DIAGNOSIS — Z515 Encounter for palliative care: Secondary | ICD-10-CM

## 2019-06-23 LAB — CBC
HCT: 42.4 % (ref 39.0–52.0)
Hemoglobin: 13.3 g/dL (ref 13.0–17.0)
MCH: 31.2 pg (ref 26.0–34.0)
MCHC: 31.4 g/dL (ref 30.0–36.0)
MCV: 99.5 fL (ref 80.0–100.0)
Platelets: 338 10*3/uL (ref 150–400)
RBC: 4.26 MIL/uL (ref 4.22–5.81)
RDW: 13.3 % (ref 11.5–15.5)
WBC: 11.9 10*3/uL — ABNORMAL HIGH (ref 4.0–10.5)
nRBC: 0 % (ref 0.0–0.2)

## 2019-06-23 LAB — GLUCOSE, CAPILLARY
Glucose-Capillary: 110 mg/dL — ABNORMAL HIGH (ref 70–99)
Glucose-Capillary: 115 mg/dL — ABNORMAL HIGH (ref 70–99)
Glucose-Capillary: 122 mg/dL — ABNORMAL HIGH (ref 70–99)
Glucose-Capillary: 122 mg/dL — ABNORMAL HIGH (ref 70–99)
Glucose-Capillary: 133 mg/dL — ABNORMAL HIGH (ref 70–99)
Glucose-Capillary: 90 mg/dL (ref 70–99)

## 2019-06-23 LAB — BASIC METABOLIC PANEL
Anion gap: 12 (ref 5–15)
BUN: 34 mg/dL — ABNORMAL HIGH (ref 8–23)
CO2: 24 mmol/L (ref 22–32)
Calcium: 8.2 mg/dL — ABNORMAL LOW (ref 8.9–10.3)
Chloride: 112 mmol/L — ABNORMAL HIGH (ref 98–111)
Creatinine, Ser: 1.34 mg/dL — ABNORMAL HIGH (ref 0.61–1.24)
GFR calc Af Amer: >60 mL/min (ref >60)
GFR calc non Af Amer: 52 mL/min — ABNORMAL LOW (ref >60)
Glucose, Bld: 146 mg/dL — ABNORMAL HIGH (ref 70–99)
Potassium: 3.1 mmol/L — ABNORMAL LOW (ref 3.5–5.1)
Sodium: 148 mmol/L — ABNORMAL HIGH (ref 135–145)

## 2019-06-23 LAB — MAGNESIUM: Magnesium: 2.2 mg/dL (ref 1.7–2.4)

## 2019-06-23 MED ORDER — KCL-LACTATED RINGERS 20 MEQ/L IV SOLN
INTRAVENOUS | Status: DC
  Filled 2019-06-23 (×2): qty 1000

## 2019-06-23 MED ORDER — POTASSIUM CHLORIDE 2 MEQ/ML IV SOLN
INTRAVENOUS | Status: DC
  Filled 2019-06-23 (×2): qty 1000

## 2019-06-23 MED ORDER — CARVEDILOL PHOSPHATE ER 10 MG PO CP24
10.0000 mg | ORAL_CAPSULE | Freq: Every day | ORAL | Status: DC
  Administered 2019-06-24 – 2019-06-30 (×6): 10 mg via ORAL
  Filled 2019-06-23 (×9): qty 1

## 2019-06-23 MED ORDER — FREE WATER
200.0000 mL | Freq: Three times a day (TID) | Status: DC
  Administered 2019-06-23 – 2019-06-24 (×3): 200 mL

## 2019-06-23 NOTE — Progress Notes (Addendum)
Hospitalist progress note   Erickson Kocian [id number] DOB: Dec 12, 1945 DOA: 06/18/2019  PCP: Patient, No Pcp Per   Narrative:  68 white male-known history of obesity BMI 32 presented code stroke 06/18/2019-CT = large acute PCA infarct EKG = ST elevation MI with reciprocal changes-patient started on heparin Cardiology and neurology consulted  Data Reviewed:  Sodium up from 1 41-145-->148, bicarb up from 18-20-->21-->24 BUN/creatinine 33/1.37-->34/1.34 White count down from 18.4-15.3-->11.9  hemoglobin 13.3, platelets 307 Echocardiogram 06/19/2019 = EF 25-30% decreased function grade 3 diastolic dysfunction CXR [id number] cardiomegaly with vascular congestion CXR 1/13 stable cardiomegaly-minimal pulm vasc congestion pulm edema Assessment & Plan: Large acute PCA infarct Significant deficits with dense hemiparesis/plegia on the left side n.p.o--continue with rectal aspirin--need DAPT once CORTRAK has been placed-will need further discussions with palliative care as below Acute inferior STEMI Initially heparin- troponins trended up- cardiology did not feel he was a candidate for anything but medical interventions Heparin IV was discontinued on 11/10-needs repeat echocardiogram 1/16 Coreg held 2/2 hypotension--change dose to once daily much lower dose--HOLD ACE for now Systolic, diastolic heart failure with pulmonary edema Keep volumes even-some evidence possible fluid overload 1/12 CXR 1/13 shows mild effusions Probable aspiration pneumonia Continue Ceftriaxone and azithromycin at this time-would complete a total of 10 days Dysphagia status post placement of NG tube Very dense hemiplegia--recurrent aspiration risk -? PEG tube Continue 2 mg twice daily imodium--diarreha better CKD stage II-III, mild hypernatremia, mild metabolic acidosis Aim to keep fluid balance even-hold D5, increase free water to 200 q 8 Follow am labs 8 mm parotid neoplasm Outpatient follow-up if does well will need discussion  about the same BMI >30  Prophylactic Lovenox, inpatient Long discussion with the daughter [name] at the bedside-[facility] to meet with her today to tease out goals Not ready for dispo yet until clarify GOC   Subjective:  More awake alert coherent  A little clearer speech Nursing reported was moving finger some--coreg, lisniopril had to be held 2/2 hypotension No other real issue Diarrhea seems less Daughter sitting at bedside   Objective: Vitals:   06/23/19 0500 06/23/19 0630 06/23/19 0748 06/23/19 1100  BP:  (!) 91/49 (!) 92/56 (!) 82/55  Pulse:  (!) 57 (!) 55 (!) 56  Resp:  20 16 20   Temp:   (!) 97.5 F (36.4 C) 98.3 F (36.8 C)  TempSrc:   Oral Axillary  SpO2:  96% 96%   Weight: 97 kg     Height:        Intake/Output Summary (Last 24 hours) at 06/23/2019 1415 Last data filed at 06/23/2019 0753 Gross per 24 hour  Intake --  Output 2500 ml  Net -2500 ml   Filed Weights   06/20/19 0323 06/21/19 0355 06/23/19 0500  Weight: 104 kg 99.9 kg 97 kg    Examination:  A little more verbal NG tube in place Decrease in facial twist Dense paresis plegia right side Abdomen soft nontender S1-S2 no murmur Telemetry first-degree AV block  Scheduled Meds: .  stroke: mapping our early stages of recovery book   Does not apply Once  . aspirin  81 mg Per Tube Daily  . atorvastatin  80 mg Oral q1800  . carvedilol  3.125 mg Oral BID  . clopidogrel  75 mg Per Tube Daily  . feeding supplement (OSMOLITE 1.5 CAL)  1,000 mL Per Tube Q24H  . heparin  5,000 Units Subcutaneous Q8H  . lisinopril  5 mg Oral Daily  . loperamide  2 mg  Oral BID   Continuous Infusions: . azithromycin 500 mg (06/23/19 1357)  . cefTRIAXone (ROCEPHIN)  IV 1 g (06/23/19 1315)  . lactated ringers 1,000 mL with potassium chloride 20 mEq infusion 50 mL/hr at 06/23/19 1019     LOS: 5 days   Time spent: [place], MD [facility]  06/23/2019, 2:15 PM

## 2019-06-23 NOTE — Progress Notes (Signed)
STROKE TEAM PROGRESS NOTE   INTERVAL HISTORY Pt daughter at bedside. Pt more awake alert today, however, still has intermittent wheezing. Still has left hemiplegia. On coreg but this am BP low, put on IVF and discontinued ACEI. Palliative care involved.    OBJECTIVE Vitals:   06/23/19 0500 06/23/19 0630 06/23/19 0748 06/23/19 1100  BP:  (!) 91/49 (!) 92/56 (!) 82/55  Pulse:  (!) 57 (!) 55 (!) 56  Resp:  20 16 20   Temp:   (!) 97.5 F (36.4 C) 98.3 F (36.8 C)  TempSrc:   Oral Axillary  SpO2:  96% 96%   Weight: 97 kg     Height:        CBC:  Recent Labs  Lab 06/18/19 1439 06/18/19 1447 06/21/19 0246 06/22/19 0221 06/23/19 0358  WBC 17.3*   < > 16.4* 15.3* 11.9*  NEUTROABS 13.8*  --  13.3*  --   --   HGB 13.9  --  13.0 13.5 13.3  HCT 42.7  --  40.1 42.9 42.4  MCV 97.3   < > 96.4 99.3 99.5  PLT 243   < > 307 310 338   < > = values in this interval not displayed.    Basic Metabolic Panel:  Recent Labs  Lab 06/21/19 0246 06/22/19 0221 06/23/19 0358 06/23/19 0731  NA 146* 145 148*  --   K 3.8 3.6 3.1*  --   CL 114* 114* 112*  --   CO2 20* 21* 24  --   GLUCOSE 109* 134* 146*  --   BUN 32* 37* 34*  --   CREATININE 1.47* 1.30* 1.34*  --   CALCIUM 8.4* 8.4* 8.2*  --   MG 2.4  --   --  2.2  PHOS 4.0  --   --   --     Lipid Panel:     Component Value Date/Time   CHOL 152 06/19/2019 0422   TRIG 107 06/19/2019 0422   HDL 32 (L) 06/19/2019 0422   CHOLHDL 4.8 06/19/2019 0422   VLDL 21 06/19/2019 0422   LDLCALC 99 06/19/2019 0422   HgbA1c:  Lab Results  Component Value Date   HGBA1C 5.3 06/19/2019   Urine Drug Screen:     Component Value Date/Time   LABOPIA NONE DETECTED 06/19/2019 1420   COCAINSCRNUR NONE DETECTED 06/19/2019 1420   LABBENZ NONE DETECTED 06/19/2019 1420   AMPHETMU NONE DETECTED 06/19/2019 1420   THCU NONE DETECTED 06/19/2019 1420   LABBARB NONE DETECTED 06/19/2019 1420    Alcohol Level No results found for: [facility]  IMAGING  CT HEAD CODE  STROKE WO CONTRAST 06/18/2019 IMPRESSION:  1. Large acute Left PCA territory infarct. No associated hemorrhage. Minor mass effect.  2. No acute anterior circulation infarct is evident (ASPECTS 10). 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   CT Code Stroke CTA Head W/WO contrast CT Code Stroke CTA Neck W/WO contrast CT Code Stroke Cerebral Perfusion with contrast 06/18/2019 IMPRESSION:  1. Positive for acute appearing occlusions of the distal Left Vertebral Artery and Left PCA: - distal left V4 occlusion, with upstream multifocal moderate and severe left vertebral stenosis in the neck. - left PCA P1 occlusion just beyond the origin.  2. CTP detects only some of the left PCA territory core infarct which was visible by plain CT.  3. 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 The right vertebral artery and basilar arteries are patent without stenosis.  4. 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  5. Mild to moderate irregularity of the bilateral MCA branches.  6. Small bilateral pleural effusions with increased pulmonary septal thickening suggestive of pulmonary edema.  7. 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 Recommend follow-up with ENT.   MRI Brain WO Contrast 1. Large acute left PCA infarct. 2. Small acute left frontal lobe infarct. 3. 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  CXR Chest Single View 06/23/2019 IMPRESSION:  1. Stable cardiomegaly. Minimal central pulmonary vascular congestion may be present. No significant consolidative process is noted. 2. No evidence of pulmonary edema. 06/22/2019 IMPRESSION:  Cardiomegaly with pulmonary vascular congestion. Perihilar alveolar opacity, likely alveolar edema. There may be mild interstitial edema superimposed. The overall  appearance is most indicative of congestive heart failure. A degree of atypical organism pneumonia superimposed cannot be excluded given the areas of perihilar airspace opacity. 06/20/2019 IMPRESSION:  Cardiomegaly and worsening vascular congestion/mild edema. Feeding tube tip is below the diaphragm. 06/19/2019 IMPRESSION:  Cardiomegaly with vascular congestion. Left basilar atelectasis/pneumonia.  06/18/2019 IMPRESSION:  1. Mild cardiomegaly with central vascular congestion and small pleural effusions.  2. Patchy airspace disease in the left infrahilar lung, atelectasis versus pneumonia.   Transthoracic Echocardiogram  06/19/2019 IMPRESSIONS:  1. Left ventricular ejection fraction, by visual estimation, is 25 to 30%. The left ventricle has severely decreased function. There is no left ventricular hypertrophy.  2. Elevated left atrial pressure.  3. Left ventricular diastolic parameters are consistent with Grade III diastolic dysfunction (restrictive).  4. Mildly dilated left ventricular internal cavity size.  5. The left ventricle demonstrates regional wall motion abnormalities.  6. Global right ventricle has normal systolic function.The right ventricular size is normal.  7. Left atrial size was normal.  8. Right atrial size was normal.  9. The mitral valve is normal in structure. Mild mitral valve regurgitation. No evidence of mitral stenosis. 10. The tricuspid valve is normal in structure. 11. The aortic valve is tricuspid. Aortic valve regurgitation is not visualized. Mild aortic valve sclerosis without stenosis. 12. The pulmonic valve was not well visualized. Pulmonic valve regurgitation is trivial. 13. The inferior vena cava is dilated in size with >50% respiratory variability, suggesting right atrial pressure of 8 mmHg. 14. Akinesis of the inferior, inferolateral, apical and distal septal walls; overall severe LV dysfunction; restrictive filling; mild LVE; mild MR   PHYSICAL EXAM    Temp:  [97.5 F (36.4 C)-98.3 F (36.8 C)] 98.3 F (36.8 C) (01/13 1100) Pulse Rate:  [55-82] 56 (01/13 1100) Resp:  [16-24] 20 (01/13 1100) BP: (82-156)/(49-73) 82/55 (01/13 1100) SpO2:  [93 %-98 %] 96 % (01/13 0748) Weight:  [97 kg] 97 kg (01/13 0500)  General - Well nourished, well developed, intermittent wheezing.  Ophthalmologic - fundi not visualized due to noncooperation.  Cardiovascular - Regular rhythm and rate.  Neuro - more awake alert than yesterday, eyes spontaneously open.  Able to follow simple commands today, but with perseveration. Severe dysarthria. PERRL, more left gaze preference, right hemianopia, not blinking to visual threat on the right, but able to blink to visual threat on the left.  Right facial droop, tongue midline.  Right upper and lower extremity flaccid, only slight withdrawal on the right lower extremity.  Left upper extremity at least 4/5, but did not follow commands on left hand.  Left lower extremity  3-/5.  DTR 1+, no Babinski.  Sensation and coordination not corporative.  Gait not tested.   ASSESSMENT/PLAN Mr. Moustafa Gladd is a 74 y.o. male with no known significant past medical history presenting as a code stroke with c/o slurred speech, right arm flaccid. Cardiology consult for STEMI.  He did not receive IV t-PA due to late presentation (>4.5 hours from time of onset).  Stroke: Large left PCA territory acute infarct - embolic patter - unknown source, could be related to STEMI or large vessel atherosclerosis.  Resultant right hemianopia, right hemiplegia, right facial droop, severe dysarthria  Code Stroke CT Head - Large acute Left PCA territory infarct. No associated hemorrhage. Minor mass effect. No acute anterior circulation infarct is evident (ASPECTS 10). 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   MRI head - large acute left PCA infarct, small acute left frontal lobe infarct, chronic right temporal and right occipital lobe  infarcts  CTA H&N - acute occlusions of the distal Left V4 and Left P1. Chronic occlusion of the right PCA. 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   2D Echo - EF 25 - 30%. Overall severe LV dysfunction.   Sars Corona Virus 2 - negative  LDL - 99  HgbA1c 5.3  UDS - negative  VTE prophylaxis - Heparin subq  No antithrombotic prior to admission, was on aspirin 81 mg daily and heparin IV. Now on ASA 81 and plavix DAPT. Will repeat TTE Friday 1/15, if EF still < 30-35%, will recommend anticoagulation at that time.   Therapy recommendations:  CIR  Disposition:  Pending  STEMI - inferior wall  Troponin -12,799-> 12,437-> 10,883-> 8791  EKG - II, III, aVF STT elevation  EF 25 - 30%. Overall severe LV dysfunction.  Cardiology on board  On aspirin PR  Heparin IV was discontinued by cardiology  Will repeat TTE Friday 1/15, if EF still < 30-35%, will recommend anticoagulation at that time.  EKG concerning for inferior leads MI  Cardiomyopathy / CHF  EF 25-30%  Cardiology recommend low dose coreg and ACE . On coreg . Lisinopril d/c'ed due to low BP . Long-term BP goal normotensive  Acute pulmonary edema  CXR - Small bilateral pleural effusions with increased pulmonary septal thickening suggestive of pulmonary edema.   Intermittent wheezing 06/19/19, s/p lasix  KVO IVF @ 10 and on cortrak with TF @ 60 with FW 100 Q4 -> decrease TF to 30 and d/c FW  Lasix 40 once  CXR 06/22/19 Cardiomegaly with pulmonary vascular congestion. The overall appearance is most indicative of congestive heart failure.  CXR 1/13 stable cardiomegaly.  No overt edema.  CXR repeat in am  Aspiration pneumonia versus pneumonitis  Severe dysarthria and difficulty handling oral secretions  Intermittent wheezing  Leukocytosis -WBC 17.3  ->15.7->16.6->16.4->15.3->11.9   On Rocephin and azithromycin (started 06/19/19)  Treatment per primary team  Hyperlipidemia  Home Lipid lowering medication: none   LDL 99, goal < 70  Current lipid lowering medication: Lipitor 80 mg daily   Continue statin at discharge  AKI vs. CKD  Cre 1.66 ->1.38->1.49->1.47->1.30->1.34  On TF @ 30cc  IVF due to hypotension  BMP monitoring   Dysphagia . Secondary to stroke . NPO . Attempted NG placement failed at bedside . Cortrak place under fluoro . On TF @ 30cc . 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 Recommend follow-up with ENT.   Small bilateral pleural effusions with increased pulmonary septal thickening suggestive of pulmonary edema.   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 This patient's care requiresreview of multiple databases, neurological assessment, discussion with family, other specialists and medical decision making of high complexity. I had long discussion with daughter at bedside, updated pt current condition, treatment plan and potential prognosis, and answered all the questions.  Daughter expressed understanding and appreciation.   Rosalin Hawking, MD PhD Stroke Neurology 06/23/2019 1:18 PM   To contact Stroke Continuity provider, please refer to  [email or web address] After hours, contact General Neurology

## 2019-06-23 NOTE — Consult Note (Signed)
Consultation Note Date: 06/23/2019   Patient Name: Levi Pruitt  DOB: [birth year]  MRN: [id number]  Age / Sex: 74 y.o., male  PCP: Patient, No Pcp Per Referring Physician: Nita Sells, MD  Reason for Consultation: Establishing goals of care and Psychosocial/spiritual support  HPI/Patient Profile: 74 y.o. male   admitted on 06/18/2019 after being found down at home, outside the window for TPA.  Transported to ER via EMS and admitted for stabilization and treatment.  CT HEAD CODE STROKE WO CONTRAST 06/18/2019 IMPRESSION:  1. Large acute Left PCA territory infarct. No associated hemorrhage. Minor mass effect.  2. No acute anterior circulation infarct is evident (ASPECTS 10). 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   Patient is lethargic, non-verbal and unable to follow commands.  He is with dysphagia, with Core-tac,  Audible throat secretions, unable to clear on his own.  Right sided facial droop, right arm is flacid  Patient and family face treatment option decisions, advanced directive decisiosn and anticipatory care needs   Clinical Assessment and Goals of Care:   This NP [name] reviewed medical records, received report from team, assessed the patient and then  spoke by telephone first with daughter Phenix Vandermeulen and then with significant other/healthcare power of attorney Bonita Quin.  Bonita Quin makes it known that she is the [facility] POA and needs to be the first point of contact for information and decisions.  Created space and opportunity for daughter and significant other to explore their thoughts and feelings regarding current medical situation.  Family are open to all offered and available medical interventions to prolong life, they remain hopeful for improvement over the next several days  Concept of  Palliative Care was discussed  A detailed discussion was had today  regarding advanced directives.  Concepts specific to code status, artifical feeding and hydration, continued IV antibiotics and rehospitalization was had.  The difference between a aggressive medical intervention path  and a palliative comfort care path for this patient at this time was had.    Values and goals of care important to patient and family were attempted to be elicited.    MOST form introduced, Hard Choices booklet left for review   Questions and concerns addressed.   Family encouraged to call with questions or concerns.   I met briefly at bedside this afternoon with daughter [name] and plan to meet in person at bedside tomorrow with significant other Bonita Quin  PMT will continue to support holistically.      HCPOA/Sheilia Dorsett--copy of healthcare power of attorney and advanced directive placed in hard chart    SUMMARY OF RECOMMENDATIONS  -[name] is open to all offered and available medical interventions to prolong life over the next several days;  "we need to see if he can improve"  - remeet with this NP on Monday with family for furtehr clarification of GOCs  Code Status/Advance Care Planning:  Full code   Encouraged H POA to consider DNR/DNI status knowing poor outcomes in similar patients  Palliative Prophylaxis:   Aspiration, Bowel Regimen, Delirium Protocol, Frequent Pain Assessment and Oral Care  Additional Recommendations (Limitations, Scope, Preferences):  Full Scope Treatment  Psycho-social/Spiritual:   Desire for further Chaplaincy support: yes  Additional Recommendations:   Emotional support offered.  Created space and opportunity for family to explore their thoughts and feelings regardring current ,medical situation.    Prognosis:   Unable to determine  Discharge Planning: To Be Determined      Primary Diagnoses: Present on Admission: . 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 Abnormal ECG . Acute ST elevation  myocardial infarction (STEMI) of inferior wall ([facility]) . Acute ischemic stroke ([place])   I have reviewed the medical record, interviewed the patient and family, and examined the patient. The following aspects are pertinent.  Past Medical History:  Diagnosis Date  . 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 Marital status: Widowed    Spouse name: Not on file  . Number of children: Not on file  . Years of education: Not on file  . Highest education level: Not on file  Occupational History  . Occupation: Lawer, semi-retired.  Tobacco Use  . Smoking status: Not on file  Substance and Sexual Activity  . Alcohol use: Not on file  . Drug use: Not on file  . Sexual activity: Not on file  Other Topics Concern  . Not on file  Social History Narrative   Staying home most of the time, does not exercise.    Social Determinants of Health   Financial Resource Strain:   . Difficulty of Paying Living Expenses: Not on file  Food Insecurity:   . Worried About Charity fundraiser in the Last Year: Not on file  . Ran Out of Food in the Last Year: Not on file  Transportation Needs:   . Lack of Transportation (Medical): Not on file  . Lack of Transportation (Non-Medical): Not on file  Physical Activity:   . Days of Exercise per Week: Not on file  . Minutes of Exercise per Session: Not on file  Stress:   . Feeling of Stress : Not on file  Social Connections:   . Frequency of Communication with Friends and Family: Not on file  . Frequency of Social Gatherings with Friends and Family: Not on file  . Attends Religious Services: Not on file  . Active Member of Clubs or Organizations: Not on file  . Attends Archivist Meetings: Not on file  . Marital Status: Not on file   History reviewed. No pertinent family history. Scheduled Meds: .  stroke: mapping our early stages of recovery book   Does not apply Once  .  aspirin  81 mg Per Tube Daily  . atorvastatin  80 mg Oral q1800  . carvedilol  3.125 mg Oral BID  . clopidogrel  75 mg Per Tube Daily  . feeding supplement (OSMOLITE 1.5 CAL)  1,000 mL Per Tube Q24H  . heparin  5,000 Units Subcutaneous Q8H  . lisinopril  5 mg Oral Daily  . loperamide  2 mg Oral BID   Continuous Infusions: . azithromycin 500 mg (06/22/19 1507)  . cefTRIAXone (ROCEPHIN)  IV 1 g (06/22/19 1347)  . lactated ringers with KCl 20 mEq/L     PRN Meds:.acetaminophen **OR** acetaminophen (TYLENOL) oral liquid 160 mg/5 mL **OR** acetaminophen, ipratropium-albuterol Medications Prior to Admission:  Prior to Admission medications  Medication Sig Start Date End Date Taking? Authorizing Provider  Cholecalciferol ([employer] VITAMIN D3) 250 MCG (10000 UT) CAPS Take 1 capsule by mouth daily.   Yes [provider]  vitamin C (ASCORBIC ACID) 250 MG tablet Take 250 mg by mouth daily.   Yes [provider]   No Known Allergies Review of Systems  Unable to perform ROS: Acuity of condition    Physical Exam Constitutional:      Appearance: He is normal weight.     Interventions: Nasal cannula in place.  Cardiovascular:     Rate and Rhythm: Bradycardia present.  Pulmonary:     Breath sounds: Decreased air movement present.  Skin:    General: Skin is warm and dry.  Neurological:     Mental Status: He is lethargic.     Comments: - non verbal and unable to follow commands     Vital Signs: BP (!) 92/56 (BP Location: Left Arm)   Pulse (!) 55   Temp (!) 97.5 F (36.4 C) (Oral)   Resp 16 Comment: rr increases when repositioning rechecked at rest  Ht 5' 9"  (1.753 m)   Wt 97 kg   SpO2 96%   BMI 31.57 kg/m  Pain Scale: 0-10 POSS *See Group Information*: 1-Acceptable,Awake and alert Pain Score: 0-No pain   SpO2: SpO2: 96 % O2 Device:SpO2: 96 % O2 Flow Rate: .O2 Flow Rate (L/min): 2 L/min  IO: Intake/output summary:   Intake/Output Summary (Last 24 hours) at  06/23/2019 0915 Last data filed at 06/23/2019 0753 Gross per 24 hour  Intake --  Output 2500 ml  Net -2500 ml    LBM: Last BM Date: 06/22/19 Baseline Weight: Weight: 97.3 kg Most recent weight: Weight: 97 kg     Palliative Assessment/Data: 30 % at best   Updated Dr. [name] via secure chat  Time In: 0800 Time Out: 0915 Time Total: 75 minutes Greater than 50%  of this time was spent counseling and coordinating care related to the above assessment and plan.  Signed by: [name], NP   Please contact Palliative Medicine Team phone at [phone] for questions and concerns.  For individual provider: See Shea Evans

## 2019-06-24 ENCOUNTER — Inpatient Hospital Stay (HOSPITAL_COMMUNITY): Payer: Medicare Other

## 2019-06-24 LAB — CBC
HCT: 43.9 % (ref 39.0–52.0)
Hemoglobin: 13.5 g/dL (ref 13.0–17.0)
MCH: 30.8 pg (ref 26.0–34.0)
MCHC: 30.8 g/dL (ref 30.0–36.0)
MCV: 100 fL (ref 80.0–100.0)
Platelets: 364 10*3/uL (ref 150–400)
RBC: 4.39 MIL/uL (ref 4.22–5.81)
RDW: 13.2 % (ref 11.5–15.5)
WBC: 11.8 10*3/uL — ABNORMAL HIGH (ref 4.0–10.5)
nRBC: 0 % (ref 0.0–0.2)

## 2019-06-24 LAB — BASIC METABOLIC PANEL
Anion gap: 10 (ref 5–15)
BUN: 32 mg/dL — ABNORMAL HIGH (ref 8–23)
CO2: 25 mmol/L (ref 22–32)
Calcium: 8.3 mg/dL — ABNORMAL LOW (ref 8.9–10.3)
Chloride: 114 mmol/L — ABNORMAL HIGH (ref 98–111)
Creatinine, Ser: 1.12 mg/dL (ref 0.61–1.24)
GFR calc Af Amer: >60 mL/min (ref >60)
GFR calc non Af Amer: >60 mL/min (ref >60)
Glucose, Bld: 143 mg/dL — ABNORMAL HIGH (ref 70–99)
Potassium: 3.5 mmol/L (ref 3.5–5.1)
Sodium: 149 mmol/L — ABNORMAL HIGH (ref 135–145)

## 2019-06-24 LAB — GLUCOSE, CAPILLARY
Glucose-Capillary: 103 mg/dL — ABNORMAL HIGH (ref 70–99)
Glucose-Capillary: 130 mg/dL — ABNORMAL HIGH (ref 70–99)
Glucose-Capillary: 145 mg/dL — ABNORMAL HIGH (ref 70–99)
Glucose-Capillary: 106 mg/dL — ABNORMAL HIGH (ref 70–99)
Glucose-Capillary: 108 mg/dL — ABNORMAL HIGH (ref 70–99)
Glucose-Capillary: 103 mg/dL — ABNORMAL HIGH (ref 70–99)

## 2019-06-24 MED ORDER — FREE WATER
300.0000 mL | Freq: Four times a day (QID) | Status: DC
  Administered 2019-06-24: 300 mL

## 2019-06-24 MED ORDER — DEXTROSE 5 % IV SOLN: INTRAVENOUS | Status: DC

## 2019-06-24 NOTE — Progress Notes (Signed)
Patient ID: Levi Pruitt, male   DOB: 29-Sep-1945, 74 y.o.   MRN: [id number]  This NP visited patient at the bedside as a follow up to  yesterday's [place] and to meet with [name]/[name].for continued conversation regarding [name]'s current medical situation.  Patient is more  alert today and responds to SO at bedside with his eyes and turns his head toward her.  However he is with  dense left hemiplegia, non verbal, unable to effectively swallow and with audible throat secretions.  We discussed human mortality and the limitations of medical interventions to prolong life quality of life in some situations.  We reviewed his Advanced Directives.  [name] understands patient would not want prolonged medical interventions 'if it wasn't going to help"; but for now family need a few more days to see if he can show improvement.   Shelia and I again discussed his high risk for decompensation, and that in spite of current medical interventions he may fail to thrive. I prepared her that anything could happen at anytime.      She understands.  MOST form introduced and discussed, Hard Choices booklet left for review  Plan of Care  -Full Code--strongly recommended  DNR/DNI- knowing poor outcomes                   In similar patients -[name] is open to all offered and available medical interventions to prolong life  " for a few more days"--they continue to hope for improvement - a conference phone call is set up for Monday at 1:15 with [name] and other family members to revisit GOCs and treatment plan   Discussed with [name] the importance of continued conversation with the family and the  medical providers regarding overall plan of care and treatment options,  ensuring decisions are within the context of the patients values and GOCs.  This nurse practitioner informed [name] and the attending that I will be out of the hospital until Monday morning.    Call palliative medicine team phone #  [phone] with questions or concerns.   Discussed with [name]  Total time spent on the unit was 45 minutes  Greater than 50% of the time was spent in counseling and coordination of care  [name] NP  Palliative Medicine Team Team Phone # [phone]

## 2019-06-24 NOTE — Progress Notes (Signed)
I have read over pt's chart, therapy notes including PT, OT, and SLP and  Also spoken to pt's primary doctor, [name]- we both agreed patient is not appropriate for inpt rehab at this time or the near future-they are actually discussing code status and his Cardiopulomanry issues are a significant problem today- he agreed it's appropriate to wait and [name] will consult [place] when pt appropriate to be screened, if/when in the future

## 2019-06-24 NOTE — Evaluation (Signed)
Physical Therapy Evaluation Patient Details Name: Levi Pruitt MRN: [id number] DOB: [birth year] Today's Date: 06/24/2019   History of Present Illness  (P) Pt is a 74 yo male who sustained a stroke due to embolism of L PCA on 1/8. 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 Since admission, pt has been dx with possible pneumonia, acute on chronic CHF, AKI, HLD, and inferior wall STEMI.  Clinical Impression  Pt in bed upon arrival of PT/OT with girlfriend present, agreeable to evaluation at this time. The pt presents with sig limitations in mobility, stability, and independence due to above dx and resultant deficits in strength, sensation, and cognition. The pt was able to communicate with yes/no questions during the evaluation, and was able to initiate some movement in the LUE and LLE to facilitate bed mobility. The pt was not able to demonstrate any volitional movement of RLE or RUE at this time, and does not report sensation to the limbs. The pt will continue to benefit from skilled PT to maximize rehab, progress, and return of function in the acute setting, as well as in an inpatient setting following d/c from the hospital. PT will continue to follow acutely.     Follow Up Recommendations CIR;Supervision/Assistance - 24 hour    Equipment Recommendations  (defer to post-acute. Pt has no DME at baseline as he was independent)    Recommendations for Other Services Rehab consult     Precautions / Restrictions Precautions Precautions: (P) Fall Precaution Comments: (P) dense R hemiparesis Restrictions Weight Bearing Restrictions: (P) No      Mobility  Bed Mobility Overal bed mobility: Needs Assistance Bed Mobility: Rolling Rolling: Max assist;+2 for physical assistance         General bed mobility comments: pt able to initiate movement  with LUE and LLE, benefits from consistent VCs, tactile cues, and extra time, still requires max of 2 to complete roll to both sides for peri-care  Transfers Overall transfer level: (not attempted due to pt fatigue)                  Ambulation/Gait                Stairs            Wheelchair Mobility    Modified Rankin (Stroke Patients Only) Modified Rankin (Stroke Patients Only) Pre-Morbid Rankin Score: No symptoms Modified Rankin: Severe disability     Balance Overall balance assessment: Needs assistance                                           Pertinent Vitals/Pain Pain Assessment: (P) Faces Faces Pain Scale: (P) No hurt Pain Intervention(s): (P) Limited activity within patient's tolerance;Monitored during session;Repositioned    Home Living Family/patient expects to be discharged to:: (P) Private residence Living Arrangements: (P) Alone Available Help at Discharge: (P) Family;Friend(s) Type of Home: (P) House Home Access: (P) Stairs to enter Entrance Stairs-Rails: (P) Right;Left Entrance Stairs-Number of Steps: (P) couple  Home Layout: (P) One level Home Equipment: (P) None('walking sticks') Additional Comments: (P) daughter lives in [place], long time girlfriend works but available PRN     Prior Function Level of Independence: (P) Independent         Comments: (P) independent, driving,  working Biochemist, clinical)      Journalist, newspaper   Dominant Hand: (P) Right    Extremity/Trunk Assessment   Upper Extremity Assessment Upper Extremity Assessment: (P) Generalized weakness;RUE deficits/detail;LUE deficits/detail    Lower Extremity Assessment Lower Extremity Assessment: Generalized weakness;RLE deficits/detail;LLE deficits/detail RLE Deficits / Details: no active movement on command, pt denies sensation. Pt demoed some movement at R ankle, could not clarify if this was reflexive or voluntary. RLE Sensation: decreased light  touch RLE Coordination: decreased fine motor;decreased gross motor LLE Deficits / Details: Pt with limited active movement, 2/5 throughout LLE Sensation: WNL    Cervical / Trunk Assessment Cervical / Trunk Assessment: Normal  Communication   Communication: (P) Receptive difficulties;Expressive difficulties  Cognition Arousal/Alertness: (P) Lethargic(pt able to arouse to stimuli) Behavior During Therapy: (P) Flat affect;WFL for tasks assessed/performed Overall Cognitive Status: (P) Difficult to assess Area of Impairment: (P) Following commands                       Following Commands: (P) Follows one step commands with increased time;Follows one step commands inconsistently       General Comments: (P) difficult to assess due to lethargy and limited communication, patient able to follows 1 step commands inconsistently; will continue to assess      General Comments General comments (skin integrity, edema, etc.): Pt not able to tolerate steated or standing positions today, given weakness noted in bed mobiltiy, expect poor stability in sseated and standing requiring maxA    Exercises     Assessment/Plan    PT Assessment Patient needs continued PT services  PT Problem List Decreased strength;Decreased mobility;Decreased safety awareness;Impaired tone;Decreased range of motion;Decreased coordination;Decreased knowledge of precautions;Obesity;Decreased activity tolerance;Decreased cognition;Decreased balance;Impaired sensation       PT Treatment Interventions DME instruction;Therapeutic exercise;Gait training;Balance training;Neuromuscular re-education;Functional mobility training;Therapeutic activities;Patient/family education    PT Goals (Current goals can be found in the Care Plan section)  Acute Rehab PT Goals Patient Stated Goal: pt girlfriend is present encouraging pt to work hard so he can go home PT Goal Formulation: Patient unable to participate in goal  setting Time For Goal Achievement: 07/08/19 Potential to Achieve Goals: Fair    Frequency Min 4X/week   Barriers to discharge Decreased caregiver support pt living alone prior to admission    Co-evaluation PT/OT/SLP Co-Evaluation/Treatment: Yes Reason for Co-Treatment: (P) Complexity of the patient's impairments (multi-system involvement);Necessary to address cognition/behavior during functional activity;For patient/therapist safety;To address functional/ADL transfers PT goals addressed during session: Mobility/safety with mobility;Balance;Strengthening/ROM OT goals addressed during session: (P) ADL's and self-care       AM-PAC PT "6 Clicks" Mobility  Outcome Measure Help needed turning from your back to your side while in a flat bed without using bedrails?: A Lot Help needed moving from lying on your back to sitting on the side of a flat bed without using bedrails?: A Lot Help needed moving to and from a bed to a chair (including a wheelchair)?: Total Help needed standing up from a chair using your arms (e.g., wheelchair or bedside chair)?: Total Help needed to walk in hospital room?: Total Help needed climbing 3-5 steps with a railing? : Total 6 Click Score: 8    End of Session Equipment Utilized During Treatment: Oxygen Activity Tolerance: Patient limited by fatigue Patient left: in bed;with call bell/phone within reach;with bed alarm set;with family/visitor present(chair position) Nurse Communication: Mobility status(pt having loose BM) PT Visit Diagnosis: Muscle weakness (generalized) (M62.81);Difficulty in walking, not elsewhere classified (  R26.2);Unsteadiness on feet (R26.81);Other symptoms and signs involving the nervous system (R29.898);Hemiplegia and hemiparesis Hemiplegia - Right/Left: Right Hemiplegia - dominant/non-dominant: Dominant Hemiplegia - caused by: Cerebral infarction    Time: (P) 1053-(P) 1130 PT Time Calculation (min) (ACUTE ONLY): (P) 37  min   Charges:   PT Evaluation $PT Eval Moderate Complexity: (P) 1 Mod          Karma Ganja, PT, DPT   Acute Rehabilitation Department Pager #: [phone]  Otho Bellows 06/24/2019, 12:45 PM

## 2019-06-24 NOTE — Progress Notes (Signed)
  Speech Language Pathology Treatment: Dysphagia;Cognitive-Linquistic  Patient Details Name: Levi Pruitt MRN: [id number] DOB: [birth year] Today's Date: 06/24/2019 Time: [id number] SLP Time Calculation (min) (ACUTE ONLY): 41 min  Assessment / Plan / Recommendation Clinical Impression  Palliative care RN leaving as SLP arrived with pt's longtime girlfriend at bedside. Compared to ST's previous notes, he appears to have declined from swallow and respiratory standpoint. SLP performed oral care to remove large piece of dried/adhered secretion from hard palate; xerostomia, dry lips. Wheezing present and significantly worsened after trials of several ice chips and 1/2 tsp water in which swallow was not palpated/observed. Periods of apnea followed by significant inhalation and wheezing. Verbal cues provided to facilitate lingual movement, pressure on tongue with spoon for applesauce trial without success and puree suctioned from mouth. Educated pt and girlfriend to severity of dysphagia and limitations for safe po presently and possibly for the future in light of observations, size of stroke. Recommend frequent oral care and allow periodic ice chips for moisture with nurse.   Limited frequency of responses in single words with decreased intelligibility if context unknown. Accuracy of biographical information approximately 40%. Cognitively he was drowsy with reduced ability to attend and limited awareness of situation- most likely due to respiratory deficits during session. ST will continue to treat while here with prognosis for language-swallow and cognitive recovery unknown.   HPI HPI:  74 year old Caucasian male, obese, with no other documented past medical history. Patient is not able to provide and history.  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 MRI of the brain also revealed chronic right temporal  and right occipital lobe infarcts.      SLP Plan  Continue with current plan of care       Recommendations  Diet recommendations: NPO;Other(comment)(may have ice chips) Medication Administration: Via alternative means                Oral Care Recommendations: Oral care QID Follow up Recommendations: Inpatient Rehab SLP Visit Diagnosis: Aphasia (R47.01);Dysphagia, oropharyngeal phase (R13.12) Plan: Continue with current plan of care       GO                [name] 06/24/2019, 1:23 PM  [name].Ed Risk analyst [phone] Office [phone]

## 2019-06-24 NOTE — Progress Notes (Signed)
Hospitalist progress note   Cinsere Damboise [id number] DOB: 02/09/1946 DOA: 06/18/2019  PCP: Patient, No Pcp Per   Narrative:  41 white male-known history of obesity BMI 32 presented code stroke 06/18/2019-CT = large acute PCA infarct EKG = ST elevation MI with reciprocal changes-patient started on heparin Cardiology and neurology consulted Because of dense hemiparesis as well as prior advanced care directives stating he would not want artificial nutrition, palliative care was consulted He is currently being treated for probable aspiration pneumonia in addition to volume depletion secondary to inadequate p.o. intake  Data Reviewed:  Sodium up from 1 41-145-->148, bicarb up from 18-20-->21-->24 BUN/creatinine 33/1.37-->34/1.34 White count down from 18.4-15.3-->11.9  hemoglobin 13.3, platelets 307 Echocardiogram 06/19/2019 = EF 25-30% decreased function grade 3 diastolic dysfunction CXR [id number] cardiomegaly with vascular congestion CXR 1/13 stable cardiomegaly-minimal pulm vasc congestion pulm edema CXR 1/14 increasing upper lobe area zone opacities right greater than left bronchovascular markings are increased  Assessment & Plan: Large acute PCA infarct Significant deficits with dense hemiparesis/plegia on the left side n.p.o--core track placed but I am holding feeds today-see below Worsened aspiration pneumonia CXR 1/14 shows worsening with right upper lobe infiltrates continue azithromycin ceftriaxone hold feeds, free water today Long discussion as below Acute inferior STEMI Initially heparin- troponins trended up- cardiology did not feel he was a candidate for anything but medical interventions Heparin IV was discontinued on 11/10-needs repeat echocardiogram 1/16 Coreg held 2/2 hypotension--change dose to once daily much lower dose--HOLD ACE for now Systolic, diastolic heart failure with pulmonary edema Present still on x-ray along with pneumonia Treat as able Repeat x-ray a.m. and may be  give Lasix Dysphagia status post placement of NG tube Very dense hemiplegia--recurrent aspiration risk -?  Will ask palliative care to see Speech to see again CKD stage II-III, mild hypernatremia, mild metabolic acidosis As above hold free water, hold diuretics and try to keep volumes even  8 mm parotid neoplasm Outpatient follow-up if does well will need discussion about the same BMI >30  Prophylactic Lovenox, inpatient Long discussion with healthcare surrogate Selinda Flavin is in the medical field as a dental assistant-she tells me that she wants to give him "every chance possible"-when I am seeing the patient today he is much more somnolent awakens but is less verbal which is a change from prior I do not think this is an extension of a stroke as this has happened in conjunction with his increased oxygen requirement and I think he is aspirating more overtly We have put a stop time of may be 1/16 to discuss trajectory-I think patient will do poorly overall with significant morbidity I will ask palliative care to reach out and reassess over the weekend Not ready for dispo yet --therapy services might of seen the patient when he was more conversant-at this juncture he is a very poor candidate for CIR and will need SNF unless he improves significantly  Subjective:  Quite sleepy Looks ill Opens eyes but then gradually drifts off to sleep No new medications-no hypnotics sedatives given-oxygen requirements have increased to 3 L Chest x-ray suggestive of right upper lobe pneumonia that was not there 1/13   Objective: Vitals:   06/24/19 0457 06/24/19 0754 06/24/19 1141 06/24/19 1226  BP:  111/73 116/84   Pulse:  65 78 85  Resp:   (!) 27   Temp:  97.8 F (36.6 C) 97.6 F (36.4 C)   TempSrc:  Axillary Axillary   SpO2:  99% 97% 97%  Weight: 94.3 kg  Height:        Intake/Output Summary (Last 24 hours) at 06/24/2019 1342 Last data filed at 06/24/2019 0803 Gross per 24 hour  Intake --   Output 600 ml  Net -600 ml   Filed Weights   06/21/19 0355 06/23/19 0500 06/24/19 0457  Weight: 99.9 kg 97 kg 94.3 kg    Examination:  Ill-appearing less verbal somewhat sleepy Deficits remain Transmitted upper respiratory sounds cannot appreciate respiratory status well Chest otherwise normal S1-S2 no murmur Abdomen soft No lower extremity edema  Scheduled Meds: .  stroke: mapping our early stages of recovery book   Does not apply Once  . aspirin  81 mg Per Tube Daily  . atorvastatin  80 mg Oral q1800  . carvedilol  10 mg Oral Daily  . clopidogrel  75 mg Per Tube Daily  . heparin  5,000 Units Subcutaneous Q8H  . loperamide  2 mg Oral BID   Continuous Infusions: . azithromycin 500 mg (06/23/19 1357)  . cefTRIAXone (ROCEPHIN)  IV 1 g (06/24/19 1220)  . dextrose       LOS: 6 days   Time spent: [place], MD [facility]  06/24/2019, 1:42 PM

## 2019-06-24 NOTE — Evaluation (Signed)
Occupational Therapy Evaluation Patient Details Name: Levi Pruitt MRN: [id number] DOB: Dec 19, 1945 Today's Date: 06/24/2019    History of Present Illness Pt is a 74 yo male who sustained a stroke due to embolism of L PCA on 1/8. 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 Since admission, pt has been dx with possible pneumonia, acute on chronic CHF, AKI, HLD, and inferior wall STEMI.   Clinical Impression   PTA patient independent and working. Admitted for above and limited by problem list below, including R hemiparesis (dominant UE), impaired activity tolerance, cognition, generalized weakness, vision.  Patient able to complete bed mobility with max assist +2 (rolling), requires total assist +1-2 for all self care at this time (soiled in BM upon entry and requires +2 for hygiene and linen change). Patient is flaccid on R side, no response noted to noxious stimuli; able to scan around room but fatigues easily and keeps eyes closed majority of session.  He is able to follow simple 1 step commands inconsistently given increased time and attempts to verbalizes several times during session. Continue assessing cognition and vision.  VSS on 2L [place].  He will benefit from continued OT services while admitted and after dc at CIR level in order to optimize independence and safety with ADLs, mobility.     Follow Up Recommendations  CIR;Supervision/Assistance - 24 hour    Equipment Recommendations  Other (comment)(TBD at next venue of care)    Recommendations for Other Services Rehab consult     Precautions / Restrictions Precautions Precautions: Fall Precaution Comments: dense R hemiparesis Restrictions Weight Bearing Restrictions: No      Mobility Bed Mobility Overal bed mobility: Needs Assistance Bed Mobility: Rolling Rolling: Max assist;+2 for  physical assistance         General bed mobility comments: pt able to initiate movement with LUE and LLE, benefits from consistent VCs, tactile cues, and extra time, still requires max of 2 to complete roll to both sides for peri-care  Transfers Overall transfer level: (not attempted due to pt fatigue)               General transfer comment: deferred    Balance Overall balance assessment: Needs assistance                                         ADL either performed or assessed with clinical judgement   ADL Overall ADL's : Needs assistance/impaired                                     Functional mobility during ADLs: Maximal assistance;+2 for physical assistance General ADL Comments: total assist for all self care at this time      Vision Baseline Vision/History: Wears glasses Wears Glasses: At all times Additional Comments: pt able to scan L and R, limited assessment as lethargic; continued assessment      Perception     Praxis      Pertinent Vitals/Pain Pain Assessment: Faces Faces Pain Scale: No hurt Pain Intervention(s): Limited activity within patient's tolerance;Monitored during session;Repositioned     Hand Dominance Right   Extremity/Trunk Assessment Upper Extremity Assessment Upper Extremity Assessment: Generalized weakness;RUE deficits/detail;LUE deficits/detail RUE Deficits /  Details: flaccid, no response to noxious stimuli RUE Sensation: decreased proprioception;decreased light touch RUE Coordination: decreased gross motor;decreased fine motor LUE Deficits / Details: able to move to command, squeeze hand and raise UE; grossly 3+/5 MMT  LUE Sensation: WNL LUE Coordination: WNL   Lower Extremity Assessment Lower Extremity Assessment: Defer to PT evaluation RLE Deficits / Details: no active movement on command, pt denies sensation. Pt demoed some movement at R ankle, could not clarify if this was reflexive or  voluntary. RLE Sensation: decreased light touch RLE Coordination: decreased fine motor;decreased gross motor LLE Deficits / Details: Pt with limited active movement, 2/5 throughout LLE Sensation: WNL   Cervical / Trunk Assessment Cervical / Trunk Assessment: Normal   Communication Communication Communication: Receptive difficulties;Expressive difficulties   Cognition Arousal/Alertness: Lethargic(pt able to arouse to stimuli) Behavior During Therapy: Flat affect;WFL for tasks assessed/performed Overall Cognitive Status: Difficult to assess Area of Impairment: Following commands                       Following Commands: Follows one step commands with increased time;Follows one step commands inconsistently       General Comments: difficult to assess due to lethargy and limited communication, patient able to follows 1 step commands inconsistently; will continue to assess   General Comments  pt soiled in BM upon entry, max assist +2 to change linen and complete hygiene; encouarged signficant other to sit on pts R side, provide tactile input to R UE and engage patient throughout the day     Exercises     Shoulder Instructions      Home Living Family/patient expects to be discharged to:: Private residence Living Arrangements: Alone Available Help at Discharge: Family;Friend(s) Type of Home: House Home Access: Stairs to enter [employer] of Steps: couple  Entrance Stairs-Rails: Right;Left Home Layout: One level     Bathroom Shower/Tub: Teacher, early years/pre: Standard     Home Equipment: None('walking sticks')   Additional Comments: daughter lives in [place], long time girlfriend works but available PRN   Lives With: Alone    Prior Functioning/Environment Level of Independence: Independent        Comments: independent, driving, working Biochemist, clinical)         OT Problem List: Decreased strength;Decreased range of motion;Decreased activity  tolerance;Impaired balance (sitting and/or standing);Impaired vision/perception;Decreased coordination;Decreased cognition;Decreased safety awareness;Decreased knowledge of use of DME or AE;Decreased knowledge of precautions;Cardiopulmonary status limiting activity;Impaired sensation;Impaired tone;Impaired UE functional use      OT Treatment/Interventions: Self-care/ADL training;Neuromuscular education;Energy conservation;Therapeutic activities;Balance training;Patient/family education;Visual/perceptual remediation/compensation;Cognitive remediation/compensation;Splinting;Manual therapy;DME and/or AE instruction    OT Goals(Current goals can be found in the care plan section) Acute Rehab OT Goals Patient Stated Goal: none stated OT Goal Formulation: Patient unable to participate in goal setting Time For Goal Achievement: 07/08/19 Potential to Achieve Goals: Fair  OT Frequency: Min 2X/week   Barriers to D/C:            Co-evaluation PT/OT/SLP Co-Evaluation/Treatment: Yes Reason for Co-Treatment: Complexity of the patient's impairments (multi-system involvement);Necessary to address cognition/behavior during functional activity;For patient/therapist safety;To address functional/ADL transfers PT goals addressed during session: Mobility/safety with mobility;Balance;Strengthening/ROM OT goals addressed during session: ADL's and self-care      AM-PAC OT "6 Clicks" Daily Activity     Outcome Measure Help from another person eating meals?: Total Help from another person taking care of personal grooming?: Total Help from another person toileting, which includes using toliet, bedpan, or urinal?: Total  Help from another person bathing (including washing, rinsing, drying)?: Total Help from another person to put on and taking off regular upper body clothing?: Total Help from another person to put on and taking off regular lower body clothing?: Total 6 Click Score: 6   End of Session Equipment  Utilized During Treatment: Oxygen(2L) Nurse Communication: Mobility status  Activity Tolerance: Patient limited by lethargy Patient left: in bed;with call bell/phone within reach;with bed alarm set;with family/visitor present  OT Visit Diagnosis: Other abnormalities of gait and mobility (R26.89);Muscle weakness (generalized) (M62.81);Hemiplegia and hemiparesis;Cognitive communication deficit (R41.841);Other symptoms and signs involving cognitive function Symptoms and signs involving cognitive functions: Cerebral infarction Hemiplegia - Right/Left: Right Hemiplegia - dominant/non-dominant: Dominant Hemiplegia - caused by: Cerebral infarction                Time: 1053-1130 OT Time Calculation (min): 37 min Charges:  OT General Charges $OT Visit: 1 Visit OT Evaluation $OT Eval Moderate Complexity: 1 Mod  Jolaine Artist, OT Acute Rehabilitation Services Pager [phone] Office [phone]    Delight Stare 06/24/2019, 12:50 PM

## 2019-06-24 NOTE — Progress Notes (Addendum)
STROKE TEAM PROGRESS NOTE   INTERVAL HISTORY Pt significant other is at the bedside. Pt lying in bed, intermittent wheezing. I was told by his significant other that pt TF was on hold due to concerns of aspiration pneumonia. Currently on gentle hydration. Pt more awake alert and answer orientation questions. Still has right hemiplegia.    OBJECTIVE Vitals:   06/24/19 0457 06/24/19 0754 06/24/19 1141 06/24/19 1226  BP:  111/73 116/84   Pulse:  65 78 85  Resp:   (!) 27   Temp:  97.8 F (36.6 C) 97.6 F (36.4 C)   TempSrc:  Axillary Axillary   SpO2:  99% 97% 97%  Weight: 94.3 kg     Height:        CBC:  Recent Labs  Lab 06/18/19 1439 06/18/19 1447 06/21/19 0246 06/22/19 0221 06/23/19 0358 06/24/19 0149  WBC 17.3*   < > 16.4*   < > 11.9* 11.8*  NEUTROABS 13.8*  --  13.3*  --   --   --   HGB 13.9   < > 13.0   < > 13.3 13.5  HCT 42.7   < > 40.1   < > 42.4 43.9  MCV 97.3   < > 96.4   < > 99.5 100.0  PLT 243   < > 307   < > 338 364   < > = values in this interval not displayed.    Basic Metabolic Panel:  Recent Labs  Lab 06/21/19 0246 06/22/19 0221 06/23/19 0358 06/23/19 0731 06/24/19 0149  NA 146*   < > 148*  --  149*  K 3.8   < > 3.1*  --  3.5  CL 114*   < > 112*  --  114*  CO2 20*   < > 24  --  25  GLUCOSE 109*   < > 146*  --  143*  BUN 32*   < > 34*  --  32*  CREATININE 1.47*   < > 1.34*  --  1.12  CALCIUM 8.4*   < > 8.2*  --  8.3*  MG 2.4  --   --  2.2  --   PHOS 4.0  --   --   --   --    < > = values in this interval not displayed.    Lipid Panel:     Component Value Date/Time   CHOL 152 06/19/2019 0422   TRIG 107 06/19/2019 0422   HDL 32 (L) 06/19/2019 0422   CHOLHDL 4.8 06/19/2019 0422   VLDL 21 06/19/2019 0422   LDLCALC 99 06/19/2019 0422   HgbA1c:  Lab Results  Component Value Date   HGBA1C 5.3 06/19/2019   Urine Drug Screen:     Component Value Date/Time   LABOPIA NONE DETECTED 06/19/2019 1420   COCAINSCRNUR NONE DETECTED 06/19/2019  1420   LABBENZ NONE DETECTED 06/19/2019 1420   AMPHETMU NONE DETECTED 06/19/2019 1420   THCU NONE DETECTED 06/19/2019 1420   LABBARB NONE DETECTED 06/19/2019 1420    Alcohol Level No results found for: [facility]  IMAGING  CT HEAD CODE STROKE WO CONTRAST 06/18/2019 IMPRESSION:  1. Large acute Left PCA territory infarct. No associated hemorrhage. Minor mass effect.  2. No acute anterior circulation infarct is evident (ASPECTS 10). 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   CT Code Stroke CTA Head W/WO contrast CT Code Stroke CTA Neck W/WO contrast CT Code Stroke Cerebral Perfusion with contrast 06/18/2019 IMPRESSION:  1. Positive for acute appearing occlusions of the distal Left Vertebral Artery and Left PCA: - distal left V4 occlusion, with upstream multifocal moderate and severe left vertebral stenosis in the neck. - left PCA P1 occlusion just beyond the origin.  2. CTP detects only some of the left PCA territory core infarct which was visible by plain CT.  3. 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 The right vertebral artery and basilar arteries are patent without stenosis.  4. 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  5. Mild to moderate irregularity of the bilateral MCA branches.  6. Small bilateral pleural effusions with increased pulmonary septal thickening suggestive of pulmonary edema.  7. 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 Recommend follow-up with ENT.   MRI Brain WO Contrast 1. Large acute left PCA infarct. 2. Small acute left frontal lobe infarct. 3. 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  CXR Chest Single View 06/24/2019 IMPRESSION:  1. Mild increase in upper lung zone airspace opacities, right greater than left, with persistent prominent  bronchovascular markings. Findings may reflect mild worsening pulmonary edema with asymmetric upper lobe airspace edema, versus multifocal infection. 06/23/2019 IMPRESSION:  1. Stable cardiomegaly. Minimal central pulmonary vascular congestion may be present. No significant consolidative process is noted. 2. No evidence of pulmonary edema. 06/22/2019 IMPRESSION:  Cardiomegaly with pulmonary vascular congestion. Perihilar alveolar opacity, likely alveolar edema. There may be mild interstitial edema superimposed. The overall appearance is most indicative of congestive heart failure. A degree of atypical organism pneumonia superimposed cannot be excluded given the areas of perihilar airspace opacity. 06/20/2019 IMPRESSION:  Cardiomegaly and worsening vascular congestion/mild edema. Feeding tube tip is below the diaphragm. 06/19/2019 IMPRESSION:  Cardiomegaly with vascular congestion. Left basilar atelectasis/pneumonia.  06/18/2019 IMPRESSION:  1. Mild cardiomegaly with central vascular congestion and small pleural effusions.  2. Patchy airspace disease in the left infrahilar lung, atelectasis versus pneumonia.    PHYSICAL EXAM   Temp:  [96 F (35.6 C)-98.4 F (36.9 C)] 97.6 F (36.4 C) (01/14 1141) Pulse Rate:  [25-142] 85 (01/14 1226) Resp:  [16-27] 27 (01/14 1141) BP: (107-123)/(68-105) 116/84 (01/14 1141) SpO2:  [88 %-99 %] 97 % (01/14 1226) Weight:  [94.3 kg] 94.3 kg (01/14 0457)  General - Well nourished, well developed, intermittent wheezing.  Ophthalmologic - fundi not visualized due to noncooperation.  Cardiovascular - Regular rhythm and rate.  Neuro - more awake alert today, eyes spontaneously open.  Able to follow simple commands today, but with perseveration. Severe dysarthria. Able to tell me his name and his significant other's name but then became SOB. PERRL, more left gaze preference, but able to cross midline, right hemianopia, not blinking to visual threat on the right,  but able to blink to visual threat on the left.  Right facial droop, tongue midline.  Right upper and lower extremity flaccid, only slight withdrawal on the right lower extremity.  Left upper extremity at least 4/5, left lower extremity 3-/5.  DTR 1+, no Babinski.  Sensation and coordination not corporative.  Gait not tested.   ASSESSMENT/PLAN [name] is a 74 y.o. male with no knownsignificant past medical history presenting as a code stroke with c/o slurred speech, right arm flaccid.Cardiology consult for STEMI.  He did not receive IV t-PA due to late presentation (>4.5 hours from time of  onset).  Stroke: Large left PCA territory acute infarct - embolic pattern - unknown source, could be related to STEMI or large vessel atherosclerosis.  Resultant right hemianopia, right hemiplegia, right facial droop, severe dysarthria  Code Stroke CT Head - Large acute Left PCA territory infarct. No associated hemorrhage. Minor mass effect. No acute anterior circulation infarct is evident (ASPECTS 10). 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   MRI head - large acute left PCA infarct, small acute left frontal lobe infarct, chronic right temporal and right occipital lobe infarcts  CTA H&N - acute occlusions of the distal Left V4 and Left P1. Chronic occlusion of the right PCA. 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   2D Echo - EF 25 - 30%. Overall severe LV dysfunction.   [employer] Virus 2 - negative  LDL - 99  HgbA1c 5.3  UDS - negative  VTE prophylaxis - Heparin subq  No antithrombotic prior to admission, was on aspirin 81 mg daily and heparin IV. Now on ASA 81 and plavix DAPT. Will repeat TTE tomorrow, if EF still < 30-35%, will recommend anticoagulation at that time.   Therapy recommendations:  CIR  Disposition:   Pending  Palliative care to follow up over the weekend  STEMI - inferior wall  Troponin -12,799-> 12,437-> 10,883-> 8791  EKG - II, III, aVF STT elevation  EF 25 - 30%. Overall severe LV dysfunction.  Cardiology on board  On aspirin PR  Heparin IV was discontinued by cardiology  Will repeat TTE tomorrow, if EF still < 30-35%, will recommend anticoagulation at that time.  EKG concerning for inferior leads MI  Cardiomyopathy / CHF  EF 25-30%  Cardiology recommend low dose coreg and ACE  On coreg  Lisinopril d/c'ed due to low BP  Long-term BP goal normotensive  Acute pulmonary edema  CXR - Small bilateral pleural effusions with increased pulmonary septal thickening suggestive of pulmonary edema.   Intermittent wheezing 06/19/19, s/p lasix  KVO IVF @ 10 and on cortrak with TF @ 60 with FW 100 Q4 -> decrease TF to 30 and d/c FW  Lasix 40 once  CXR 06/22/19 Cardiomegaly with pulmonary vascular congestion. The overall appearance is most indicative of congestive heart failure.  CXR 1/13 stable cardiomegaly.  No overt edema.  CXR 1/14 worsening edema vs infection  Aspiration pneumonia versus pneumonitis  Severe dysarthria and difficulty handling oral secretions  Intermittent wheezing  Leukocytosis -WBC 17.3 ->15.7->16.6->16.4->15.3->11.9->11.8  On Rocephin and azithromycin (started 06/19/19)  Now TF on hold, on gentle IVF  Treatment per primary team  CXR 1/14 worsening edema vs infection  Hyperlipidemia  Home Lipid lowering medication: none   LDL 99, goal < 70  Current lipid lowering medication: Lipitor 80 mg daily   Continue statin at discharge  AKI vs. 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 Recommend follow-up with  ENT.   Small bilateral pleural effusions with increased pulmonary septal thickening suggestive of pulmonary edema.   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 After hours, contact General Neurology

## 2019-06-24 NOTE — Progress Notes (Signed)
Rehab Admissions Coordinator Note:  Patient was screened by Cleatrice Burke for appropriateness for an Inpatient Acute Rehab Consult per PT and OT recs.   At this time, we are recommending Inpatient Rehab consult. Noted palliative consulted . I will place rehab consult per protocol.  Cleatrice Burke RN MSN 06/24/2019, 1:25 PM  I can be reached at [phone].

## 2019-06-25 ENCOUNTER — Inpatient Hospital Stay (HOSPITAL_COMMUNITY): Payer: Medicare Other

## 2019-06-25 DIAGNOSIS — I6389 Other cerebral infarction: Secondary | ICD-10-CM

## 2019-06-25 DIAGNOSIS — I63439 Cerebral infarction due to embolism of unspecified posterior cerebral artery: Secondary | ICD-10-CM

## 2019-06-25 DIAGNOSIS — K118 Other diseases of salivary glands: Secondary | ICD-10-CM

## 2019-06-25 LAB — CBC
HCT: 43.9 % (ref 39.0–52.0)
Hemoglobin: 13.4 g/dL (ref 13.0–17.0)
MCH: 30.9 pg (ref 26.0–34.0)
MCHC: 30.5 g/dL (ref 30.0–36.0)
MCV: 101.4 fL — ABNORMAL HIGH (ref 80.0–100.0)
Platelets: 337 10*3/uL (ref 150–400)
RBC: 4.33 MIL/uL (ref 4.22–5.81)
RDW: 13.2 % (ref 11.5–15.5)
WBC: 14 10*3/uL — ABNORMAL HIGH (ref 4.0–10.5)
nRBC: 0 % (ref 0.0–0.2)

## 2019-06-25 LAB — GLUCOSE, CAPILLARY
Glucose-Capillary: 102 mg/dL — ABNORMAL HIGH (ref 70–99)
Glucose-Capillary: 104 mg/dL — ABNORMAL HIGH (ref 70–99)
Glucose-Capillary: 81 mg/dL (ref 70–99)
Glucose-Capillary: 88 mg/dL (ref 70–99)
Glucose-Capillary: 91 mg/dL (ref 70–99)
Glucose-Capillary: 93 mg/dL (ref 70–99)
Glucose-Capillary: 93 mg/dL (ref 70–99)

## 2019-06-25 LAB — BASIC METABOLIC PANEL
Anion gap: 10 (ref 5–15)
BUN: 33 mg/dL — ABNORMAL HIGH (ref 8–23)
CO2: 26 mmol/L (ref 22–32)
Calcium: 8.5 mg/dL — ABNORMAL LOW (ref 8.9–10.3)
Chloride: 112 mmol/L — ABNORMAL HIGH (ref 98–111)
Creatinine, Ser: 1.22 mg/dL (ref 0.61–1.24)
GFR calc Af Amer: >60 mL/min (ref >60)
GFR calc non Af Amer: 58 mL/min — ABNORMAL LOW (ref >60)
Glucose, Bld: 114 mg/dL — ABNORMAL HIGH (ref 70–99)
Potassium: 4 mmol/L (ref 3.5–5.1)
Sodium: 148 mmol/L — ABNORMAL HIGH (ref 135–145)

## 2019-06-25 LAB — ECHOCARDIOGRAM COMPLETE
Height: 69 in
Weight: 3332.8 oz

## 2019-06-25 MED ORDER — FUROSEMIDE 10 MG/ML IJ SOLN
40.0000 mg | Freq: Once | INTRAMUSCULAR | Status: AC
  Administered 2019-06-25: 40 mg via INTRAVENOUS
  Filled 2019-06-25: qty 4

## 2019-06-25 MED ORDER — ASPIRIN 325 MG PO TABS
325.0000 mg | ORAL_TABLET | Freq: Every day | ORAL | Status: DC
  Administered 2019-06-26 – 2019-06-27 (×2): 325 mg via ORAL
  Filled 2019-06-25 (×3): qty 1

## 2019-06-25 MED ORDER — SODIUM CHLORIDE 3 % IN NEBU
4.0000 mL | INHALATION_SOLUTION | Freq: Every day | RESPIRATORY_TRACT | Status: DC
  Filled 2019-06-25: qty 4

## 2019-06-25 MED ORDER — PERFLUTREN LIPID MICROSPHERE
1.0000 mL | INTRAVENOUS | Status: AC | PRN
  Administered 2019-06-25: 3 mL via INTRAVENOUS
  Filled 2019-06-25: qty 10

## 2019-06-25 MED ORDER — PERFLUTREN LIPID MICROSPHERE
INTRAVENOUS | Status: AC
  Filled 2019-06-25: qty 10

## 2019-06-25 MED ORDER — SODIUM CHLORIDE 3 % IN NEBU
4.0000 mL | INHALATION_SOLUTION | Freq: Every day | RESPIRATORY_TRACT | Status: AC
  Administered 2019-06-26 – 2019-06-27 (×2): 4 mL via RESPIRATORY_TRACT
  Filled 2019-06-25 (×2): qty 4

## 2019-06-25 NOTE — Progress Notes (Signed)
STROKE TEAM PROGRESS NOTE   INTERVAL HISTORY Pt daughgter is at the bedside. Pt lying in bed, occasional wheezing, SOB improved from yesterday. More awake alert and interactive. Still has right hemiplegia. TF still on hold. TTE repeat done and EF up to 30-35%.   OBJECTIVE Vitals:   06/25/19 0723 06/25/19 0732 06/25/19 0845 06/25/19 1000  BP:  130/78 124/69 116/81  Pulse: 74 (!) 44 69 71  Resp: (!) 24 (!) 24 20 (!) 23  Temp:  (!) 96.2 F (35.7 C)    TempSrc:  Axillary    SpO2: 100% 90% 93% 99%  Weight:      Height:        CBC:  Recent Labs  Lab 06/21/19 0246 06/22/19 0221 06/24/19 0149 06/25/19 0319  WBC 16.4*   < > 11.8* 14.0*  NEUTROABS 13.3*  --   --   --   HGB 13.0   < > 13.5 13.4  HCT 40.1   < > 43.9 43.9  MCV 96.4   < > 100.0 101.4*  PLT 307   < > 364 337   < > = values in this interval not displayed.    Basic Metabolic Panel:  Recent Labs  Lab 06/21/19 0246 06/22/19 0221 06/23/19 0358 06/23/19 0731 06/24/19 0149 06/25/19 0319  NA 146*   < >   < >  --  149* 148*  K 3.8   < >   < >  --  3.5 4.0  CL 114*   < >   < >  --  114* 112*  CO2 20*   < >   < >  --  25 26  GLUCOSE 109*   < >   < >  --  143* 114*  BUN 32*   < >   < >  --  32* 33*  CREATININE 1.47*   < >   < >  --  1.12 1.22  CALCIUM 8.4*   < >   < >  --  8.3* 8.5*  MG 2.4  --   --  2.2  --   --   PHOS 4.0  --   --   --   --   --    < > = values in this interval not displayed.    Lipid Panel:     Component Value Date/Time   CHOL 152 06/19/2019 0422   TRIG 107 06/19/2019 0422   HDL 32 (L) 06/19/2019 0422   CHOLHDL 4.8 06/19/2019 0422   VLDL 21 06/19/2019 0422   LDLCALC 99 06/19/2019 0422   HgbA1c:  Lab Results  Component Value Date   HGBA1C 5.3 06/19/2019   Urine Drug Screen:     Component Value Date/Time   LABOPIA NONE DETECTED 06/19/2019 1420   COCAINSCRNUR NONE DETECTED 06/19/2019 1420   LABBENZ NONE DETECTED 06/19/2019 1420   AMPHETMU NONE DETECTED 06/19/2019 1420   THCU NONE  DETECTED 06/19/2019 1420   LABBARB NONE DETECTED 06/19/2019 1420    Alcohol Level No results found for: [facility]  IMAGING  CT HEAD CODE STROKE WO CONTRAST 06/18/2019 IMPRESSION:  1. Large acute Left PCA territory infarct. No associated hemorrhage. Minor mass effect.  2. No acute anterior circulation infarct is evident (ASPECTS 10). 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   CT Code Stroke CTA Head W/WO contrast CT Code Stroke CTA Neck W/WO contrast CT Code Stroke Cerebral Perfusion with contrast 06/18/2019 IMPRESSION:  1. Positive for acute appearing  occlusions of the distal Left Vertebral Artery and Left PCA: - distal left V4 occlusion, with upstream multifocal moderate and severe left vertebral stenosis in the neck. - left PCA P1 occlusion just beyond the origin.  2. CTP detects only some of the left PCA territory core infarct which was visible by plain CT.  3. 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 The right vertebral artery and basilar arteries are patent without stenosis.  4. 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  5. Mild to moderate irregularity of the bilateral MCA branches.  6. Small bilateral pleural effusions with increased pulmonary septal thickening suggestive of pulmonary edema.  7. 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 Recommend follow-up with ENT.   MRI Brain WO Contrast 1. Large acute left PCA infarct. 2. Small acute left frontal lobe infarct. 3. 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  CXR Chest Single View 06/25/2019 IMPRESSION: Continued worsening of diffuse airspace opacities throughout both lungs, most confluent within the right upper lobe. 06/24/2019 IMPRESSION:  1. Mild increase in upper lung zone airspace opacities, right greater than  left, with persistent prominent bronchovascular markings. Findings may reflect mild worsening pulmonary edema with asymmetric upper lobe airspace edema, versus multifocal infection. 06/23/2019 IMPRESSION:  1. Stable cardiomegaly. Minimal central pulmonary vascular congestion may be present. No significant consolidative process is noted. 2. No evidence of pulmonary edema. 06/22/2019 IMPRESSION:  Cardiomegaly with pulmonary vascular congestion. Perihilar alveolar opacity, likely alveolar edema. There may be mild interstitial edema superimposed. The overall appearance is most indicative of congestive heart failure. A degree of atypical organism pneumonia superimposed cannot be excluded given the areas of perihilar airspace opacity. 06/20/2019 IMPRESSION:  Cardiomegaly and worsening vascular congestion/mild edema. Feeding tube tip is below the diaphragm. 06/19/2019 IMPRESSION:  Cardiomegaly with vascular congestion. Left basilar atelectasis/pneumonia.  06/18/2019 IMPRESSION:  1. Mild cardiomegaly with central vascular congestion and small pleural effusions.  2. Patchy airspace disease in the left infrahilar lung, atelectasis versus pneumonia.   PHYSICAL EXAM   Temp:  [96.2 F (35.7 C)-97.8 F (36.6 C)] 96.2 F (35.7 C) (01/15 0732) Pulse Rate:  [44-86] 71 (01/15 1000) Resp:  [17-39] 23 (01/15 1000) BP: (97-130)/(58-86) 116/81 (01/15 1000) SpO2:  [90 %-100 %] 99 % (01/15 1000) Weight:  [94.5 kg] 94.5 kg (01/15 0500)  General - Well nourished, well developed, occasionally wheezing.  Ophthalmologic - fundi not visualized due to noncooperation.  Cardiovascular - Regular rhythm and rate.  Neuro - more awake alert and interactive. Orientated to place self but not to time.  Able to follow simple commands today, and answer orientation questions. Severe dysarthria. PERRL, left gaze preference, barely cross midline, right hemianopia and neglect, not blinking to visual threat on the right, but able  to blink to visual threat on the left.  Right facial droop, tongue midline.  Right upper and lower extremity flaccid, only slight withdrawal on the right lower extremity.  Left upper extremity at least 4/5, left lower extremity 3/5.  DTR 1+, no Babinski.  Sensation and coordination not corporative.  Gait not tested.   ASSESSMENT/PLAN Mr. Koner Frerking is a 74 y.o. male with no knownsignificant past medical history presenting as a code stroke with c/o slurred speech, right arm flaccid.Cardiology consult for STEMI.  He did not receive IV t-PA due to late presentation (>4.5 hours  from time of onset).  Stroke: Large left PCA territory acute infarct - embolic pattern - unknown source, could be related to STEMI or large vessel atherosclerosis.  Resultant right hemianopia, right hemiplegia, right facial droop, severe dysarthria  Code Stroke CT Head - Large acute Left PCA territory infarct. No associated hemorrhage. Minor mass effect. No acute anterior circulation infarct is evident (ASPECTS 10). 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   MRI head - large acute left PCA infarct, small acute left frontal lobe infarct, chronic right temporal and right occipital lobe infarcts  CTA H&N - acute occlusions of the distal Left V4 and Left P1. Chronic occlusion of the right PCA. 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   2D Echo 1/9 - EF 25 - 30%. Overall severe LV dysfunction.   2D echo 1/15 - EF 30-35%. Inferior/inferoseptal/apical akinesis  [employer] Virus 2 - negative  LDL - 99  HgbA1c 5.3  UDS - negative  VTE prophylaxis - Heparin subq  No antithrombotic prior to admission, on ASA 325 and plavix DAPT for 3 months and then ASA alone.   Therapy recommendations:  CIR  Disposition:  Pending  Palliative care to follow up over the weekend  STEMI  - inferior wall  Troponin -12,799-> 12,437-> 10,883-> 8791  EKG - II, III, aVF STT elevation  1/9 EF 25 - 30%. Overall severe LV dysfunction.  1/15 Repeat EF 30-35%.   Cardiology on board  Heparin IV was discontinued by cardiology  On ASA and plavix DAPT  EKG concerning for inferior leads MI  Cardiomyopathy / CHF  EF 25-30% 1/9  Repeat EF 30-35% 1/11  Cardiology recommend low dose coreg and ACE  On coreg  Lisinopril d/c'ed due to low BP  Long-term BP goal normotensive  Acute pulmonary edema  CXR - Small bilateral pleural effusions with increased pulmonary septal thickening suggestive of pulmonary edema.   Intermittent wheezing 06/19/19, s/p lasix  Lasix 40 PRN  CXR 06/22/19 Cardiomegaly with pulmonary vascular congestion. The overall appearance is most indicative of congestive heart failure.  CXR 1/13 stable cardiomegaly.  No overt edema.  CXR 1/14 worsening edema vs infection  CXR 1/15 Continued worsening of diffuse airspace opacities throughout both lungs, most confluent within the right upper lobe.  TF on hold -> gentle hydration  Aspiration pneumonia  Severe dysarthria and difficulty handling oral secretions  Intermittent wheezing  Leukocytosis -WBC 17.3 ->15.7->16.6->16.4->15.3->11.9->11.8->14.0  On Rocephin and azithromycin (started 06/19/19)  Now TF on hold, on gentle IVF  CXR 1/15 Continued worsening of diffuse airspace opacities throughout both lungs, most confluent within the right upper lobe.  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 Recommend follow-up with ENT.   Small bilateral pleural effusions with increased pulmonary septal thickening suggestive of pulmonary edema.    Hospital day # 6  Neurology will sign off. Please call with questions. Pt will follow up with stroke clinic Dr. Leonie Man at [facility] in about 4 weeks. Thanks for the consult.   Rosalin Hawking, MD PhD Stroke Neurology 06/24/2019 2:28 PM To contact Stroke Continuity provider, please refer to [email or web address] After hours, contact General Neurology

## 2019-06-25 NOTE — Progress Notes (Signed)
Inpatient Rehab Admissions:  Inpatient Rehab Consult received.  Please see note from Dr. Dagoberto Ligas on 1/14.  Will sign off at this time.   Signed: Shann Medal, PT, DPT Admissions Coordinator [phone] 06/25/19  1:57 PM

## 2019-06-25 NOTE — Progress Notes (Signed)
Physical Therapy Treatment Patient Details Name: Levi Pruitt MRN: [id number] DOB: [birth year] Today's Date: 06/25/2019    History of Present Illness Pt is a 74 yo male who sustained a stroke due to embolism of L PCA on 1/8. 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 Since admission, pt has been dx with possible pneumonia, acute on chronic CHF, AKI, HLD, and inferior wall STEMI.    PT Comments    Pt admitted for above. Per RN, pt had difficult day in terms of respiratory issues, so recommended treatment be limited to bed-level exercise. Pt demonstrates active movement of the L side, so completed L UE and LE exercises AAROM. He does not display any movement on the R, so completed PROM on this side of UE and LE. All UE and LE joints adequately mobile. Pt on RA upon arrival and during session. During L heel slides, he briefly dropped to 86% SpO2, but rebounded quickly and remained above 93% for rest of session. Pt's daughter Levi Pruitt present during treatment. See that CIR declined pt admission, so discharge recommendation updated to SNF at this time to improve independence following acute discharge. Will continue to follow acutely to progress mobility.   Follow Up Recommendations  SNF;Supervision/Assistance - 24 hour     Equipment Recommendations  None recommended by PT(TBD)    Recommendations for Other Services       Precautions / Restrictions Precautions Precautions: Fall Restrictions Weight Bearing Restrictions: No    Mobility  Bed Mobility                  Transfers                    Ambulation/Gait                 Stairs             Wheelchair Mobility    Modified Rankin (Stroke Patients Only)       Balance                                            Cognition  Arousal/Alertness: Lethargic Behavior During Therapy: Flat affect Overall Cognitive Status: Difficult to assess Area of Impairment: Following commands                       Following Commands: Follows one step commands with increased time;Follows one step commands inconsistently       General Comments: difficult to assess due to lethargy and limited communication, patient able to follows 1 step commands inconsistently; will continue to assess      Exercises General Exercises - Upper Extremity Elbow Flexion: Left;10 reps;Supine;AAROM Elbow Extension: AAROM;Left;10 reps;Supine General Exercises - Lower Extremity Ankle Circles/Pumps: PROM;AAROM;Both;10 reps;Supine Heel Slides: PROM;AAROM;Both;10 reps;Supine Straight Leg Raises: PROM;AAROM;Both;10 reps;Supine    General Comments        Pertinent Vitals/Pain Pain Assessment: Faces Pain Score: 0-No pain Pain Intervention(s): Limited activity within patient's tolerance;Monitored during session    Home Living                      Prior Function            PT Goals (current goals can now  be found in the care plan section) Acute Rehab PT Goals Patient Stated Goal: unable PT Goal Formulation: Patient unable to participate in goal setting Time For Goal Achievement: 07/08/19 Potential to Achieve Goals: Fair Progress towards PT goals: Progressing toward goals    Frequency    Min 3X/week      PT Plan Discharge plan needs to be updated;Frequency needs to be updated    Co-evaluation              AM-PAC PT "6 Clicks" Mobility   Outcome Measure  Help needed turning from your back to your side while in a flat bed without using bedrails?: Total Help needed moving from lying on your back to sitting on the side of a flat bed without using bedrails?: Total Help needed moving to and from a bed to a chair (including a wheelchair)?: Total Help needed standing up from a chair using your arms (e.g.,  wheelchair or bedside chair)?: Total Help needed to walk in hospital room?: Total Help needed climbing 3-5 steps with a railing? : Total 6 Click Score: 6    End of Session   Activity Tolerance: Patient limited by lethargy Patient left: in bed;with call bell/phone within reach;with family/visitor present Nurse Communication: Mobility status PT Visit Diagnosis: Muscle weakness (generalized) (M62.81);Difficulty in walking, not elsewhere classified (R26.2);Unsteadiness on feet (R26.81);Other symptoms and signs involving the nervous system (R29.898);Hemiplegia and hemiparesis Hemiplegia - Right/Left: Right Hemiplegia - dominant/non-dominant: Dominant Hemiplegia - caused by: Cerebral infarction     Time: [id number] PT Time Calculation (min) (ACUTE ONLY): 13 min  Charges:  $Therapeutic Exercise: 8-22 mins                     Christel Mormon, SPT    [place] Levi Pruitt 06/25/2019, 4:03 PM

## 2019-06-25 NOTE — Progress Notes (Signed)
  Echocardiogram 2D Echocardiogram has been performed.  Levi Pruitt 06/25/2019, 10:39 AM

## 2019-06-25 NOTE — Progress Notes (Signed)
Hospitalist progress note   Levi Pruitt [id number] DOB: 12-Mar-1946 DOA: 06/18/2019  PCP: Patient, No Pcp Per   Narrative:  53 white male-known history of obesity BMI 32 presented code stroke 06/18/2019-CT = large acute PCA infarct EKG = ST elevation MI with reciprocal changes-patient started on heparin Cardiology and neurology consulted Because of dense hemiparesis as well as prior advanced care directives stating he would not want artificial nutrition, palliative care was consulted He is currently being treated for probable aspiration pneumonia in addition to volume depletion secondary to inadequate p.o. intake  Data Reviewed:  Sodium 148, bicarb 26 BUN/creatinine 33/1.2 White count down from 18.4-15.3--> 14.0  hemoglobin 13.3, platelets 307 Echocardiogram 06/19/2019 = EF 25-30% decreased function grade 3 diastolic dysfunction [id number] [id number] cardiomegaly with vascular congestion CXR 1/13 stable cardiomegaly-minimal pulm vasc congestion pulm edema CXR 1/14 increasing upper lobe area zone opacities right greater than left bronchovascular markings are increased  Assessment & Plan: Large acute PCA infarct Significant deficits with dense hemiparesis/plegia on the left side n.p.o--core track feeds in addition to free water on hold since 1/14 Discuss over the next several days planning Worsened aspiration pneumonia Dysphagia status post placement of NG tube Very dense hemiplegia--recurrent aspiration risk -?  CXR 1/14 shows worsening with right upper lobe infiltrates continue azithromycin ceftriaxone hold feeds, free water today Long discussions with multiple family members 1/14 A.m. 1/15 required deep NT suctioning with coarse mucus around core track I have had long discussions with family that NG tubes can actually worsen aspiration-we will revisit discussions based on trend over the next day with family Acute inferior STEMI Initially heparin- troponins trended up- cardiology did not feel he was a  candidate for anything but medical interventions Heparin IV was discontinued on 11/10-needs repeat echocardiogram 1/16 Coreg held 2/2 hypotension--change dose to once daily much lower dose--HOLD ACE for now Systolic, diastolic heart failure with pulmonary edema Present still on x-ray along with pneumonia Treat as able-given Lasix x1 1/15 CKD stage II-III, mild hypernatremia, mild metabolic acidosis As above hold free water--- symptomatic treatment if possible May not be able to prevent hypernatremia 8 mm parotid neoplasm Outpatient follow-up if does well will need discussion about the same BMI >30  Prophylactic Lovenox, inpatient  stop time of may be 1/16 to discuss trajectory-I think patient will do poorly overall with significant morbidity-I have discussed my concerns with the patient's daughter on several days and then with patient significant other on 1/14 Palliative medicine has seen and the patient is being recommended full care We will have to see how things proceed over the next several days  Subjective:  Temps low 96 range blood pressures maintaining respiratory rate seems to have increased over the past day and was suctioned multiple times by respiratory therapy He seems clearer today more coherent talking better He still has dense paresis on the right side   Objective: Vitals:   06/25/19 0402 06/25/19 0500 06/25/19 0723 06/25/19 0732  BP: (!) 103/59   130/78  Pulse: (!) 59  74 (!) 44  Resp: (!) 39  (!) 24 (!) 24  Temp: (!) 97.4 F (36.3 C)   (!) 96.2 F (35.7 C)  TempSrc: Axillary   Axillary  SpO2: 100%  100% 90%  Weight:  94.5 kg    Height:        Intake/Output Summary (Last 24 hours) at 06/25/2019 0739 Last data filed at 06/25/2019 0600 Gross per 24 hour  Intake 509.66 ml  Output 1055 ml  Net -545.34  ml   Filed Weights   06/23/19 0500 06/24/19 0457 06/25/19 0500  Weight: 97 kg 94.3 kg 94.5 kg    Examination:  More coherent compared to 1/14 able to  verbalize clear and range of verbalization is increased from just single syllables Some crackles in chest Respiratory rate seems to have diminished Abdomen soft no rebound Dense paresis on right side   Scheduled Meds: .  stroke: mapping our early stages of recovery book   Does not apply Once  . aspirin  81 mg Per Tube Daily  . atorvastatin  80 mg Oral q1800  . carvedilol  10 mg Oral Daily  . clopidogrel  75 mg Per Tube Daily  . heparin  5,000 Units Subcutaneous Q8H  . loperamide  2 mg Oral BID   Continuous Infusions: . azithromycin 500 mg (06/24/19 1521)  . cefTRIAXone (ROCEPHIN)  IV 1 g (06/24/19 1220)  . dextrose 40 mL/hr at 06/24/19 1700     LOS: 7 days   Time spent: [place], MD [facility]  06/25/2019, 7:39 AM

## 2019-06-26 LAB — CBC
HCT: 44.8 % (ref 39.0–52.0)
Hemoglobin: 14.3 g/dL (ref 13.0–17.0)
MCH: 31.8 pg (ref 26.0–34.0)
MCHC: 31.9 g/dL (ref 30.0–36.0)
MCV: 99.6 fL (ref 80.0–100.0)
Platelets: 319 10*3/uL (ref 150–400)
RBC: 4.5 MIL/uL (ref 4.22–5.81)
RDW: 13.1 % (ref 11.5–15.5)
WBC: 15.8 10*3/uL — ABNORMAL HIGH (ref 4.0–10.5)
nRBC: 0 % (ref 0.0–0.2)

## 2019-06-26 LAB — GLUCOSE, CAPILLARY
Glucose-Capillary: 103 mg/dL — ABNORMAL HIGH (ref 70–99)
Glucose-Capillary: 75 mg/dL (ref 70–99)
Glucose-Capillary: 85 mg/dL (ref 70–99)
Glucose-Capillary: 86 mg/dL (ref 70–99)
Glucose-Capillary: 93 mg/dL (ref 70–99)
Glucose-Capillary: 98 mg/dL (ref 70–99)

## 2019-06-26 LAB — BASIC METABOLIC PANEL
Anion gap: 11 (ref 5–15)
BUN: 31 mg/dL — ABNORMAL HIGH (ref 8–23)
CO2: 26 mmol/L (ref 22–32)
Calcium: 8.4 mg/dL — ABNORMAL LOW (ref 8.9–10.3)
Chloride: 110 mmol/L (ref 98–111)
Creatinine, Ser: 1.28 mg/dL — ABNORMAL HIGH (ref 0.61–1.24)
GFR calc Af Amer: >60 mL/min (ref >60)
GFR calc non Af Amer: 55 mL/min — ABNORMAL LOW (ref >60)
Glucose, Bld: 101 mg/dL — ABNORMAL HIGH (ref 70–99)
Potassium: 3.9 mmol/L (ref 3.5–5.1)
Sodium: 147 mmol/L — ABNORMAL HIGH (ref 135–145)

## 2019-06-26 NOTE — Progress Notes (Signed)
Hospitalist progress note   Morey Frerichs [id number] DOB: 11/16/1945 DOA: 06/18/2019  PCP: Patient, No Pcp Per   Narrative:  79 white male-known history of obesity BMI 32 presented code stroke 06/18/2019-CT = large acute PCA infarct EKG = ST elevation MI with reciprocal changes-patient started on heparin Cardiology and neurology consulted Because of dense hemiparesis as well as prior advanced care directives stating he would not want artificial nutrition, palliative care was consulted He is currently being treated for probable aspiration pneumonia in addition to volume depletion secondary to inadequate p.o. intake  Data Reviewed:  Sodium 147, bicarb 26 BUN/creatinine 33/1.2 White count down from 18.4-15.3--> 14.0-->15.8  hemoglobin 14.3, platelets 319 Echocardiogram 06/19/2019 = EF 25-30% decreased function grade 3 diastolic dysfunction CXR [id number] cardiomegaly with vascular congestion CXR 1/13 stable cardiomegaly-minimal pulm vasc congestion pulm edema CXR 1/14 increasing upper lobe area zone opacities right greater than left bronchovascular markings are increased CXR 1/15 continued worsening airspace opacities--mostly RUL Assessment & Plan: Large acute PCA infarct Significant deficits with dense hemiparesis/plegia on the left side n.p.o--core track feeds in addition to free  water on hold since 1/14 Await  SLP to re-assess--interested in PEG if needed and if vented by SLP per family member sheila who discussed this me on 1/16 Worsened aspiration pneumonia Dysphagia status post placement of NG tube Very dense hemiplegia--recurrent aspiration risk -?  RT had to deep suction several times on 1/15-Since 1/15  hold feeds, free water CXR 1/14 shows worsening with right upper lobe infiltrates azithromycin ceftriaxone and would not narrow at this time Acute inferior STEMI Initially heparin- troponins trended up- cardiology did not feel was a candidate for anything but medical interventions Heparin  IV was discontinued on 11/10-needs repeat echocardiogram 1/16 Coreg held 2/2 hypotension--change dose to once daily much lower dose--HOLD ACE for now Systolic, diastolic heart failure with pulmonary edema Present still on x-ray along with pneumonia Treat as able-given Lasix x1 1/15 CKD stage II-III, mild hypernatremia, mild metabolic acidosis As above hold free water--- symptomatic treatment if possible May not be able to prevent hypernatremia 8 mm parotid neoplasm Outpatient follow-up if does well will need discussion about the same BMI >30  Prophylactic Lovenox, inpatient-called and left message with significant other 1/16 [name] [id number] explaining course of events in the persistent n.p.o. She is interested in pursuing PEG placement if there is a need  Subjective: Garbled speech occasionally makes sense Seems to have mitten on left hand now Mentation seems to be improving slightly still has NG tube but is n.p.o. Getting IV fluids   Objective: Vitals:   06/25/19 1733 06/25/19 2102 06/26/19 0000 06/26/19 0321  BP:  127/75 132/74 112/66  Pulse:  65 70 67  Resp:  20 20 20   Temp: 98 F (36.7 C) (!) 97.5 F (36.4 C) 97.8 F (36.6 C) 97.8 F (36.6 C)  TempSrc: Oral Oral Oral Oral  SpO2:  95% 96% 99%  Weight:      Height:        Intake/Output Summary (Last 24 hours) at 06/26/2019 0750 Last data filed at 06/26/2019 0600 Gross per 24 hour  Intake 1372.39 ml  Output 800 ml  Net 572.39 ml   Filed Weights   06/23/19 0500 06/24/19 0457 06/25/19 0500  Weight: 97 kg 94.3 kg 94.5 kg    Examination:  EOMI NCAT T no focal deficit but does have twisting of the mouth to the right side Dense right-sided paresis remains Left hand now has a mitten in place Abdomen  is soft no rebound no guarding    Scheduled Meds: .  stroke: mapping our early stages of recovery book   Does not apply Once  . aspirin  325 mg Oral Daily  . atorvastatin  80 mg Oral q1800  . carvedilol  10 mg Oral  Daily  . clopidogrel  75 mg Per Tube Daily  . heparin  5,000 Units Subcutaneous Q8H  . loperamide  2 mg Oral BID  . sodium chloride HYPERTONIC  4 mL Nebulization Daily   Continuous Infusions: . azithromycin 500 mg (06/25/19 1415)  . cefTRIAXone (ROCEPHIN)  IV 1 g (06/25/19 1234)  . dextrose 40 mL/hr at 06/24/19 1700     LOS: 8 days   Time spent: [place], MD [facility]  06/26/2019, 7:50 AM

## 2019-06-26 NOTE — Progress Notes (Signed)
  Speech Language Pathology Treatment: Dysphagia  Patient Details Name: Levi Pruitt MRN: [id number] DOB: 03-Aug-1945 Today's Date: 06/26/2019 Time: 1350-1420 SLP Time Calculation (min) (ACUTE ONLY): 30 min  Assessment / Plan / Recommendation Clinical Impression  Patient seen for f/u diagnostic dysphagia treatment today at the request of MD who is questioning if patient will require long term non-oral means of nutrition. Patient alert upon entrance to room, eyes open, appropriate, spontaneous greeting noted. Per daughter who was present for session, patient sleeping most of the morning. Oral care complete to maximize safety with po intake. Patient positioned upright at 90 degrees and pos administered. Patient able to consume ice chips and pureed solids via tsp, both with prolonged oral transit of bolus and no overt s/s of aspiration, but with consistent increased in RR, expiratory wheezing, use of accessory muscles, and belly breathing during and post swallow. Vocal quality however remaining clear through out trials and receptive to moderate verbal cueing provided today to facilitate improved oral transit time. No suctioning of oral cavity required. Pt is new today to this therapist however upon review of previous notes, today appears to be slight improvement in alertness and ability to orally transit bolus as compared to previous sessions. Discussed with patient's daughter. Would suggest that patient ben seen by SLP over the next 24-48 hours. If patient continues to make improvement, would consider repeat instrumental testing to determine if safe po intake is an efficient possibility. If patient does not make gains, would suggest MD discussion regarding non-oral means of nutrition if this is in line with patient/family wishes.    HPI HPI:  74 year old Caucasian male, obese, with no other documented past medical history. Patient is not able to provide and history.  Patient was admitted with acute large left  PCA infarct with right sided hemiplegia/paresis/speech problems and small acute left frontal lobe infarct. 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      SLP Plan  Continue with current plan of care       Recommendations  Diet recommendations: NPO Medication Administration: Via alternative means                Oral Care Recommendations: Oral care QID Follow up Recommendations: Inpatient Rehab SLP Visit Diagnosis: Aphasia (R47.01);Dysphagia, oropharyngeal phase (R13.12) Plan: Continue with current plan of care       [place] MA, CCC-SLP    Levi Pruitt Levi Pruitt 06/26/2019, 2:39 PM

## 2019-06-27 LAB — BASIC METABOLIC PANEL
Anion gap: 12 (ref 5–15)
BUN: 31 mg/dL — ABNORMAL HIGH (ref 8–23)
CO2: 24 mmol/L (ref 22–32)
Calcium: 8.2 mg/dL — ABNORMAL LOW (ref 8.9–10.3)
Chloride: 109 mmol/L (ref 98–111)
Creatinine, Ser: 1.2 mg/dL (ref 0.61–1.24)
GFR calc Af Amer: >60 mL/min (ref >60)
GFR calc non Af Amer: 60 mL/min — ABNORMAL LOW (ref >60)
Glucose, Bld: 101 mg/dL — ABNORMAL HIGH (ref 70–99)
Potassium: 3.8 mmol/L (ref 3.5–5.1)
Sodium: 145 mmol/L (ref 135–145)

## 2019-06-27 LAB — CBC
HCT: 45.2 % (ref 39.0–52.0)
Hemoglobin: 13.9 g/dL (ref 13.0–17.0)
MCH: 30.7 pg (ref 26.0–34.0)
MCHC: 30.8 g/dL (ref 30.0–36.0)
MCV: 99.8 fL (ref 80.0–100.0)
Platelets: 307 10*3/uL (ref 150–400)
RBC: 4.53 MIL/uL (ref 4.22–5.81)
RDW: 13.2 % (ref 11.5–15.5)
WBC: 16.1 10*3/uL — ABNORMAL HIGH (ref 4.0–10.5)
nRBC: 0 % (ref 0.0–0.2)

## 2019-06-27 LAB — GLUCOSE, CAPILLARY
Glucose-Capillary: 142 mg/dL — ABNORMAL HIGH (ref 70–99)
Glucose-Capillary: 79 mg/dL (ref 70–99)
Glucose-Capillary: 90 mg/dL (ref 70–99)
Glucose-Capillary: 91 mg/dL (ref 70–99)
Glucose-Capillary: 96 mg/dL (ref 70–99)
Glucose-Capillary: 99 mg/dL (ref 70–99)

## 2019-06-27 NOTE — Progress Notes (Signed)
Hospitalist progress note   Levi Pruitt [id number] DOB: Nov 10, 1945 DOA: 06/18/2019  PCP: Patient, No Pcp Per   Narrative:  75 white male-known history of obesity BMI 32 presented code stroke 06/18/2019-CT = large acute PCA infarct EKG = ST elevation MI with reciprocal changes-patient started on heparin Cardiology and neurology consulted Because of dense hemiparesis as well as prior advanced care directives stating he would not want artificial nutrition, palliative care was consulted He is currently being treated for probable aspiration pneumonia in addition to volume depletion secondary to inadequate p.o. intake   Data Reviewed:  Sodium 145, bicarb 26 BUN/creatinine 33/1.2 White count down from 18.4-15.3--> 14.0-->15.8  hemoglobin 14.3, platelets 319 Echocardiogram 06/19/2019 = EF 25-30% decreased function grade 3 diastolic dysfunction CXR [id number] cardiomegaly with vascular congestion CXR 1/13 stable cardiomegaly-minimal pulm vasc congestion pulm edema CXR 1/14 increasing upper lobe area zone opacities right greater than left bronchovascular markings are increased CXR 1/15 continued worsening airspace opacities--mostly RUL  Assessment & Plan: Large acute PCA infarct Significant deficits with dense hemiparesis/plegia on the left side n.p.o--core track feeds in addition to free  water on hold since 1/14 Await  SLP to re-assess--interested in PEG if needed and if vetted by SLP per family member Levi Pruitt who discussed this me on 1/16 Worsened aspiration pneumonia Dysphagia status post placement of NG tube Very dense hemiplegia--recurrent aspiration risk -?  RT had to deep suction several times on 1/15-Since 1/15  hold feeds, free water CXR 1/14 shows worsening with right upper lobe infiltrates azithromycin ceftriaxone and would not narrow at this time Continue IV D5 for now---Will address nutrition in the next 24-48 hours as above Acute inferior STEMI Initially heparin- troponins trended up-  cardiology did not feel was a candidate for anything but medical interventions Heparin IV was discontinued on 11/10-needs repeat echocardiogram 1/16 Coreg held 2/2 hypotension--change dose to once daily much lower dose--HOLD ACE for now Systolic, diastolic heart failure with pulmonary edema Present still on x-ray along with pneumonia Treat as able-given Lasix x1 1/15 CKD stage II-III, mild hypernatremia, mild metabolic acidosis As above hold free water--- symptomatic treatment if possible May not be able to prevent hypernatremia 8 mm parotid neoplasm Outpatient follow-up if does well will need discussion about the same BMI >30  Prophylactic Lovenox, inpatient-called and left message with significant other 1/16 Levi Pruitt [id number] explaining course of events in the persistent n.p.o. She is interested in pursuing PEG placement if there is a need Still not ready for dispo pending clinical improvement and unclear route of nutrition--wherther patient is palliative r not  Subjective:  Making some sense-voice a little stronger--coherent in nad Dense deficits persist Mitten on L hand and nursing states continues to pull at it   Objective: Vitals:   06/27/19 0400 06/27/19 0700 06/27/19 0800 06/27/19 0823  BP: (!) 111/58 (!) 97/57  112/73  Pulse: (!) 57 (!) 57  (!) 53  Resp: (!) 21 18  18   Temp: 98 F (36.7 C)  97.6 F (36.4 C) (!) 97.5 F (36.4 C)  TempSrc: Oral  Axillary Oral  SpO2: 93% 97%  99%  Weight:      Height:        Intake/Output Summary (Last 24 hours) at 06/27/2019 1024 Last data filed at 06/27/2019 0500 Gross per 24 hour  Intake 550 ml  Output 650 ml  Net -100 ml   Filed Weights   06/23/19 0500 06/24/19 0457 06/25/19 0500  Weight: 97 kg 94.3 kg 94.5 kg  Examination:  EOMI NCAT T no focal deficit but does have twisting of the mouth to the right side Mucosa dry R Hemineglect Dense right-sided paresis remains in UE and LE Left hand c mitten in place Abdomen is  soft no rebound no guarding    Scheduled Meds: .  stroke: mapping our early stages of recovery book   Does not apply Once  . aspirin  325 mg Oral Daily  . atorvastatin  80 mg Oral q1800  . carvedilol  10 mg Oral Daily  . clopidogrel  75 mg Per Tube Daily  . heparin  5,000 Units Subcutaneous Q8H   Continuous Infusions: . azithromycin 500 mg (06/26/19 1308)  . cefTRIAXone (ROCEPHIN)  IV 1 g (06/26/19 1152)  . dextrose 40 mL/hr at 06/24/19 1700     LOS: 9 days   Time spent: [place], MD [facility]  06/27/2019, 10:24 AM

## 2019-06-28 ENCOUNTER — Inpatient Hospital Stay (HOSPITAL_COMMUNITY): Payer: Medicare Other

## 2019-06-28 DIAGNOSIS — R531 Weakness: Secondary | ICD-10-CM

## 2019-06-28 LAB — GLUCOSE, CAPILLARY
Glucose-Capillary: 101 mg/dL — ABNORMAL HIGH (ref 70–99)
Glucose-Capillary: 92 mg/dL (ref 70–99)
Glucose-Capillary: 99 mg/dL (ref 70–99)
Glucose-Capillary: 107 mg/dL — ABNORMAL HIGH (ref 70–99)
Glucose-Capillary: 86 mg/dL (ref 70–99)

## 2019-06-28 LAB — COMPREHENSIVE METABOLIC PANEL
ALT: 38 U/L (ref 0–44)
AST: 37 U/L (ref 15–41)
Albumin: 2.2 g/dL — ABNORMAL LOW (ref 3.5–5.0)
Alkaline Phosphatase: 80 U/L (ref 38–126)
Anion gap: 11 (ref 5–15)
BUN: 26 mg/dL — ABNORMAL HIGH (ref 8–23)
CO2: 25 mmol/L (ref 22–32)
Calcium: 8.2 mg/dL — ABNORMAL LOW (ref 8.9–10.3)
Chloride: 108 mmol/L (ref 98–111)
Creatinine, Ser: 1.22 mg/dL (ref 0.61–1.24)
GFR calc Af Amer: >60 mL/min (ref >60)
GFR calc non Af Amer: 58 mL/min — ABNORMAL LOW (ref >60)
Glucose, Bld: 104 mg/dL — ABNORMAL HIGH (ref 70–99)
Potassium: 3.6 mmol/L (ref 3.5–5.1)
Sodium: 144 mmol/L (ref 135–145)
Total Bilirubin: 1.1 mg/dL (ref 0.3–1.2)
Total Protein: 5.3 g/dL — ABNORMAL LOW (ref 6.5–8.1)

## 2019-06-28 MED ORDER — ASPIRIN 81 MG PO CHEW
324.0000 mg | CHEWABLE_TABLET | Freq: Every day | ORAL | Status: DC
  Administered 2019-06-28 – 2019-07-02 (×5): 324 mg via ORAL
  Filled 2019-06-28 (×5): qty 4

## 2019-06-28 MED ORDER — FLUCONAZOLE 100 MG PO TABS
100.0000 mg | ORAL_TABLET | Freq: Every day | ORAL | Status: DC
  Administered 2019-06-28 – 2019-07-02 (×5): 100 mg via ORAL
  Filled 2019-06-28 (×5): qty 1

## 2019-06-28 NOTE — Progress Notes (Signed)
Occupational Therapy Treatment Patient Details Name: Levi Pruitt MRN: [id number] DOB: [birth year] Today's Date: 06/28/2019    History of present illness Pt is a 74 yo male who sustained a stroke due to embolism of L PCA on 1/8. 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 Since admission, pt has been dx with possible pneumonia, acute on chronic CHF, AKI, HLD, and inferior wall STEMI.   OT comments  This 74 yo male admitted with above seen today in conjunction with PT to work on sitting balance and standing. PT tolerated sitting EOB for ~15 minutes and partially stood x 3 with [place] today (no AROM noted in RUE or RLE). Pt will continue to benefit from acute OT with follow up OT on CIR.  Follow Up Recommendations  CIR;Supervision/Assistance - 24 hour    Equipment Recommendations  Other (comment)(TBD next venue)    Recommendations for Other Services Rehab consult    Precautions / Restrictions Precautions Precautions: Fall Precaution Comments: dense R hemiparesis Restrictions Weight Bearing Restrictions: No       Mobility Bed Mobility Overal bed mobility: Needs Assistance Bed Mobility: Rolling;Sidelying to Sit Rolling: Mod assist Sidelying to sit: Total assist       General bed mobility comments: Intiates rolling to right, but if he cannot reach the bed rail on right with LUE he cannot roll further over to reach rail without Mod A; total A to roll to left. Pt initated right sidelying to sit but that was all he was able to do.  Transfers Overall transfer level: Needs assistance Equipment used: 2 person hand held assist Transfers: Sit to/from Stand Sit to Stand: Max assist;+2 physical assistance;Total assist         General transfer comment: Placed back of recliner in front of pateint for him to use left handle on back to if for sit  to stand. Pt with sit<>stand max A +2 but only able to achieve ~75% of upright standing stance. Able to stand for ~1 minute each time with increased A as time increased. right knee blocked entire time.    Balance Overall balance assessment: Needs assistance Sitting-balance support: Single extremity supported;Feet supported   Sitting balance - Comments: Pt varied from fair to zero with tendency to push with LUE at times and other times needed cues to use LUE to try to help him balance   Standing balance support: Bilateral upper extremity supported Standing balance-Leahy Scale: Zero                                    Vision Baseline Vision/History: Wears glasses Wears Glasses: At all times Additional Comments: While seated EOB pt able to look left with head and eyes but not fully (1/2 past midline), to right pt only with head and eyes to midline          Cognition Arousal/Alertness: Awake/alert Behavior During Therapy: Flat affect Overall Cognitive Status: Impaired/Different from baseline Area of Impairment: Following commands;Orientation;Safety/judgement;Problem solving                 Orientation Level: Disoriented to;Place;Time(did not give a response to these)     Following Commands: Follows one step commands inconsistently Safety/Judgement: Decreased awareness of safety;Decreased awareness of deficits   Problem Solving: Slow processing;Difficulty sequencing;Requires verbal cues;Requires tactile cues;Decreased initiation  Pertinent Vitals/ Pain       Pain Assessment: Faces Faces Pain Scale: No hurt         Frequency  Min 2X/week        Progress Toward Goals  OT Goals(current goals can now be found in the care plan section)  Progress towards OT goals: Progressing toward goals     Plan Discharge plan remains appropriate    Co-evaluation    PT/OT/SLP Co-Evaluation/Treatment: Yes Reason for Co-Treatment:  Complexity of the patient's impairments (multi-system involvement);For patient/therapist safety PT goals addressed during session: Mobility/safety with mobility;Balance;Strengthening/ROM OT goals addressed during session: Strengthening/ROM      AM-PAC OT "6 Clicks" Daily Activity     Outcome Measure   Help from another person eating meals?: Total Help from another person taking care of personal grooming?: Total Help from another person toileting, which includes using toliet, bedpan, or urinal?: Total Help from another person bathing (including washing, rinsing, drying)?: Total Help from another person to put on and taking off regular upper body clothing?: Total Help from another person to put on and taking off regular lower body clothing?: Total 6 Click Score: 6    End of Session Equipment Utilized During Treatment: Oxygen(2 liters at rest, 3 liters (with O2 tube extension with activity))  OT Visit Diagnosis: Other abnormalities of gait and mobility (R26.89);Muscle weakness (generalized) (M62.81);Hemiplegia and hemiparesis;Cognitive communication deficit (R41.841);Other symptoms and signs involving cognitive function Symptoms and signs involving cognitive functions: Cerebral infarction Hemiplegia - Right/Left: Right Hemiplegia - dominant/non-dominant: Dominant Hemiplegia - caused by: Cerebral infarction   Activity Tolerance  tolerated well   Patient Left in bed;with call bell/phone within reach;with bed alarm set           Time: 1058-1130 OT Time Calculation (min): 32 min  Charges: OT General Charges $OT Visit: 1 Visit OT Treatments $Therapeutic Activity: 8-22 mins  Cathy  OTR/L Acute [employer] Pager [phone] Office [phone]      06/28/2019, 12:12 PM

## 2019-06-28 NOTE — Progress Notes (Signed)
Nutrition Follow-up  DOCUMENTATION CODES:   Obesity unspecified  INTERVENTION:  Magic cup TID with meals, each supplement provides 290 kcal and 9 grams of protein.  48 hour calorie count initiated via MD.   NUTRITION DIAGNOSIS:   Inadequate oral intake related to dysphagia(s/p CVA) as evidenced by NPO status; diet advanced; improving  GOAL:   Patient will meet greater than or equal to 90% of their needs; progressing  MONITOR:   PO intake, Supplement acceptance, Diet advancement, Skin, Weight trends, Labs, I & O's  REASON FOR ASSESSMENT:   Consult Enteral/tube feeding initiation and management  ASSESSMENT:   74 year old Caucasian male, obese, with no other documented past medical history. Patient is not able to provide and history.  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  1/9: admitted for acute CVA 1/11: Small bore feeding tube placed by diagnostic radiology, tip of tube in distal duodenum.  1/12: tube feeding initiated  Tube feeding has put on hold 1/14 due for probably aspiration PNA. Pt underwent MBS today. Pt with moderate oropharyngeal dysphagia with resultant silent aspiration of thin liquid and deep laryngeal penetration with nectar thick liquids. Diet has been advanced to a dysphagia 1 diet with honey thick liquids. Concerns pt may not meet nutrition needs via PO secondary to lethargy. MD has order 48 hour calorie count. Feeding tube continues to be in place, however clamped at this time. RD to continue to follow up for calorie count.   Labs and medications reviewed.   Diet Order:   Diet Order            DIET - DYS 1 Room service appropriate? No; Fluid consistency: Honey Thick  Diet effective now              EDUCATION NEEDS:   No education needs have been  identified at this time  Skin:  Skin Assessment: Reviewed RN Assessment  Last BM:  1/17  Height:   Ht Readings from Last 1 Encounters:  06/20/19 5\' 9"  (1.753 m)    Weight:   Wt Readings from Last 1 Encounters:  06/25/19 94.5 kg    Ideal Body Weight:  72.7 kg  BMI:  Body mass index is 30.76 kg/m.  Estimated Nutritional Needs:   Kcal:  2000-2200  Protein:  90-105g  Fluid:  2L/day    Corrin Parker, MS, RD, LDN Pager # [phone] After hours/ weekend pager # [phone]

## 2019-06-28 NOTE — Progress Notes (Signed)
Hospitalist progress note   Levi Pruitt [id number] DOB: 1945-07-06 DOA: 06/18/2019  PCP: Patient, No Pcp Per   Narrative:  51 white male-known history of obesity BMI 32 presented code stroke 06/18/2019-CT = large acute PCA infarct EKG = ST elevation MI with reciprocal changes-patient started on heparin Cardiology and neurology consulted Because of dense hemiparesis as well as prior advanced care directives stating he would not want artificial nutrition, palliative care was consulted He is currently being treated for probable aspiration pneumonia in addition to volume depletion secondary to inadequate p.o. intake   Data Reviewed:  Sodium 145-->144, bicarb 26 BUN/creatinine 33/1.2-->26/1.2 White count down from 18.4-15.3--> 14.0-->15.8  hemoglobin 14.3, platelets 319 Echocardiogram 06/19/2019 = EF 25-30% decreased function grade 3 diastolic dysfunction CXR [id number] cardiomegaly with vascular congestion CXR 1/13 stable cardiomegaly-minimal pulm vasc congestion pulm edema CXR 1/14 increasing upper lobe area zone opacities right greater than left bronchovascular markings are increased CXR 1/15 continued worsening airspace opacities--mostly RUL  Assessment & Plan: Large acute PCA infarct Significant deficits with dense hemiparesis/plegia on the left side n.p.o--core track feeds in addition to free  water on hold since 1/14 Await MBS, possible PEG tube placement soon if fails Worsened aspiration pneumonia Dysphagia status post placement of NG tube Very dense hemiplegia--recurrent aspiration risk -?  RT had to deep suction several times on 1/15-Since 1/15  hold feeds, free water CXR 1/14 shows worsening with right upper lobe infiltrates-azithromycin discontinued 1/17 continue ceftriaxone for now until we know results of MBS Continue IV D5 40 cc/H for now---Will address nutrition in the next 24-48 hours as above Acute inferior STEMI Initially heparin- troponins trended up- cardiology did not feel  was a candidate for anything but medical interventions Heparin IV was discontinued on 11/10-needs repeat echocardiogram 1/16 Coreg held 2/2 hypotension--change dose to once daily much lower dose--HOLD ACE for now Systolic, diastolic heart failure with pulmonary edema Present still on x-ray along with pneumonia Treat as able-given Lasix x1 1/15 CKD stage II-III, mild hypernatremia, mild metabolic acidosis As above hold free water--- symptomatic treatment if possible May not be able to prevent hypernatremia 8 mm parotid neoplasm Outpatient follow-up if does well will need discussion about the same BMI >30  Prophylactic Lovenox, inpatient-called and left message with significant other 1/16 [name] [id number] explaining course of events in the persistent n.p.o. Not ready for discharge awaiting clearance as above Dispo unclear at this time  Subjective:  Much more awake coherent Nursing informs me cannot give majority of meds crushed and it is unclear when he last received them-I was made aware this morning Patient tells me he would be willing to get a PEG tube if he fails a swallow eval  Objective: Vitals:   06/27/19 2116 06/27/19 2347 06/28/19 0300 06/28/19 0722  BP: 127/65 136/81 117/62 125/61  Pulse: 64 65 (!) 58 66  Resp: (!) 24 20 18  (!) 22  Temp: 98.1 F (36.7 C) 97.7 F (36.5 C) 97.9 F (36.6 C) 98 F (36.7 C)  TempSrc: Oral Oral Oral Axillary  SpO2: 96% 96% 95% 93%  Weight:      Height:        Intake/Output Summary (Last 24 hours) at 06/28/2019 1027 Last data filed at 06/28/2019 0300 Gross per 24 hour  Intake 1478.81 ml  Output 400 ml  Net 1078.81 ml   Filed Weights   06/23/19 0500 06/24/19 0457 06/25/19 0500  Weight: 97 kg 94.3 kg 94.5 kg    Examination:  Coherent pleasant no distress facial  twisting to the right side EOMI NCAT Poor exam as patient cannot cooperate to sit up on chest however less rhonchorous anteriorly S1-S2 no murmur Seems to be sinus, sinus  tach on exam No lower extremity edema Some intertriginous areas which appear to be yeast Dense right-sided deficits persist  Scheduled Meds: .  stroke: mapping our early stages of recovery book   Does not apply Once  . aspirin  325 mg Oral Daily  . atorvastatin  80 mg Oral q1800  . carvedilol  10 mg Oral Daily  . clopidogrel  75 mg Per Tube Daily  . fluconazole  100 mg Oral Daily  . heparin  5,000 Units Subcutaneous Q8H   Continuous Infusions: . cefTRIAXone (ROCEPHIN)  IV 1 g (06/27/19 1305)  . dextrose 40 mL/hr at 06/27/19 1854     LOS: 10 days   Time spent: [place], MD [facility]  06/28/2019, 10:27 AM

## 2019-06-28 NOTE — Progress Notes (Signed)
Patient ID: Levi Pruitt, male   DOB: May 24, 1946, 74 y.o.   MRN: [id number]  This NP visited patient at the bedside as a follow up to last weeks [place] meeting for Palliative  Medicine  needs and emotional support.   Patient is more alert today, intermittently  follows commands.  Family tells me at times he is verbalizing appropriately.  I do not believe he has medical decision making capacity at this time. He is weak and high risk for decompensation.    Telephone conference call with family to include SO/[name]/[name], Margaret/sister, Alice/daughter, and Therman/son for continued conversation regarding diagnosis, prognosis, treatment option decisions, advanced directive decisions, EOL wishes, disposition and anticipatory care needs.  Conversation was strained at times, explaining to family and helping them understand  the limitations that the healthcare system can currently  offer hospitalized patients; ie, 0000000 visitation policy,  daily therapies vs the current  several times a week.     Most of discussion was around artifical feeding and risk of aspiration.  Dr Macon Large has spoke with [name] and feels that even though he has been cleared for a diet by speech/MBS he remains high risk for aspiration, and consideration of PEG should be considered.    Today we discussed risk and benefits of PEG and the importance of placing a  time frame consideration/trial before intervention and along the course of the patient's medical path.  Plan of Care: -Full code-      recommended DNR/DNI understanding poor           outcomes in similar patients, family will considered - move forward with recommended diet over the next 24-48 hours, hold off on PEG, evaluate for adequate intake and aspiration -family is open to all offered and available medical interventions to prolong life.  They are hopeful for recovery   Patient has a docuemtebnd [name] and a Declaration of a Desire for a Natrual Death   Discussed with patient  the importance of continued conversation with family and their  medical providers regarding overall plan of care and treatment options,  ensuring decisions are within the context of the patients values and GOCs.   Education and emotional support offered  Questions and concerns addressed   Discussed with [name]  Total time spent on the unit was 60 minutes  Greater than 50% of the time was spent in counseling and coordination of care  [name] NP  Palliative Medicine Team Team Phone # [phone] Pager [phone]

## 2019-06-28 NOTE — Progress Notes (Signed)
  Speech Language Pathology Treatment: Dysphagia;Cognitive-Linquistic  Patient Details Name: Levi Pruitt MRN: [id number] DOB: 1946/03/07 Today's Date: 06/28/2019 Time: [id number] SLP Time Calculation (min) (ACUTE ONLY): 18 min  Assessment / Plan / Recommendation Clinical Impression  Pt was seen for skilled ST targeting dysphagia and aphasia.  Pt was more awake/alert than in previous ST sessions and he was observed to have increased spontaneous speech.  Oral care was provided prior to PO trials and pt tolerated without difficulty.  Pt currently has a Cortrak in place for alternative nutrition.  He consumed trials of ice chips, thin liquid via tsp and straw sip, and puree.  He exhibited prolonged mastication of ice chips and prolonged AP transport with puree trials with suspected delayed swallow initiation of all trials.  An immediate cough was observed following 1/4 straw sips of thin liquid; however, no additional clinical s/sx of aspiration were observed with remaining trials.  Pt was additionally observed to have 2-4 swallows per bolus with thin liquid via straw sip, possibly indicative of pharyngeal residue or piecemeal deglutition.  Plan for repeat MBS at 12:30 to further evaluate swallow function and to help determine if pt is appropriate for PO diet initiation.    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  Receptive language appears to be a relative strength in comparison to expressive language.  He participated in simple conversational speech with appropriate responses approximately 80% of the time independently.  He completed a confrontational naming task with 4/8 (50%) accuracy independently, improving to 6/8 (75%) accuracy given phonemic and semantic cues, and 8/8 (100%) given the verbal word for the pt to repeat.  Pt repeated words and short phrases with 100% accuracy independently with a few phonemic paraphasias observed. He additionally answered  yes/no questions with 9/10 (90%) accuracy independently.  Recommend continued ST (inpatient rehab) at time of discharge targeting dysphagia and aphasia.  SLP will continue to f/u acutely.    HPI HPI:  74 year old Caucasian male, obese, with no other documented past medical history. Patient is not able to provide and history.  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      SLP Plan  MBS;New goals to be determined pending instrumental study       Recommendations  Diet recommendations: NPO Medication Administration: Via alternative means                Oral Care Recommendations: Oral care QID Follow up Recommendations: Inpatient Rehab SLP Visit Diagnosis: Aphasia (R47.01);Dysphagia, oropharyngeal phase (R13.12) Plan: MBS;New goals to be determined pending instrumental study                      Colin Mulders M.S., [place] Office: [phone]  [place] 06/28/2019, 9:17 AM

## 2019-06-28 NOTE — Progress Notes (Signed)
Morning medications held due to all are extended release or enteric coated and cannot be crushed.  Patient is NPO and takes medications via Cortrack due to aspiration risk.

## 2019-06-28 NOTE — Progress Notes (Signed)
Physical Therapy Treatment Patient Details Name: Levi Pruitt MRN: [id number] DOB: Jul 08, 1945 Today's Date: 06/28/2019    History of Present Illness Pt is a 74 yo male admitted 06/18/19 with a stroke due to embolism of L PCA on 06/18/19. 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 Since admission, pt has been dx with possible pneumonia, acute on chronic CHF, AKI, HLD, and inferior wall STEMI. No PMH on file.   PT Comments    Pt progressing with mobility. Able to perform 3x standing trials with maxA+2, requiring assist to block R knee and elevate trunk, pt initiating movement to assist with LUE/LLE, no active movement noted on R-side. Pt with R-side inattention throughout, not looking to R-side past midline. Daughter present at end of session and supportive, hoping to be present next session to see pt's current mobility status. Continue to recommend intensive CIR-level therapies to maximize functional mobility and independence.    Follow Up Recommendations  CIR;Supervision/Assistance - 24 hour     Equipment Recommendations  (TBD)    Recommendations for Other Services       Precautions / Restrictions Precautions Precautions: Fall Precaution Comments: dense R hemiparesis with inattention Restrictions Weight Bearing Restrictions: No    Mobility  Bed Mobility Overal bed mobility: Needs Assistance Bed Mobility: Rolling;Sidelying to Sit;Sit to Supine Rolling: Mod assist Sidelying to sit: Total assist   Sit to supine: Total assist;+2 for physical assistance   General bed mobility comments: Intiates rolling to right, but if he cannot reach the bed rail on right with LUE he cannot roll further over to reach rail without Mod A; total A to roll to left. Pt initated right sidelying to sit but that was all he was able to do.  Transfers Overall  transfer level: Needs assistance Equipment used: 2 person hand held assist Transfers: Sit to/from Stand Sit to Stand: Max assist;+2 physical assistance;Total assist         General transfer comment: Placed back of recliner in front of pateint for him to use left handle on back to if for sit to stand. Pt with sit<>stand max A +2 but only able to achieve ~75% of upright standing stance. Able to stand for ~1 minute each time with increased A as time increased. right knee blocked entire time.  Ambulation/Gait                 Stairs             Wheelchair Mobility    Modified Rankin (Stroke Patients Only)       Balance Overall balance assessment: Needs assistance Sitting-balance support: Single extremity supported;Feet supported Sitting balance-Leahy Scale: Poor Sitting balance - Comments: Pt varied from fair to zero with tendency to push with LUE at times and other times needed cues to use LUE to try to help him balance   Standing balance support: Bilateral upper extremity supported Standing balance-Leahy Scale: Zero                              Cognition Arousal/Alertness: Awake/alert Behavior During Therapy: Flat affect Overall Cognitive Status: Impaired/Different from baseline Area of Impairment: Orientation;Attention;Following commands;Safety/judgement;Awareness;Problem solving                 Orientation Level: Disoriented to;Place;Time;Situation Current Attention Level: Focused;Sustained   Following Commands: Follows one  step commands inconsistently Safety/Judgement: Decreased awareness of safety;Decreased awareness of deficits Awareness: Intellectual Problem Solving: Slow processing;Difficulty sequencing;Requires verbal cues;Requires tactile cues;Decreased initiation General Comments: Able to state first name and name preference; when asked orientation questions or specific questions in general (daughter's name), pt mumbling  unintelligible words and not answering question      Exercises      General Comments General comments (skin integrity, edema, etc.): Pt aware of incontinence and able to communicate he needed to be cleaned up; assist to roll, dependent for pericare. SpO2 down to 87% on 2L O2 1x, maintaining >/92% on 3L O2 (w/ 1 extension) with mobility; returned to 2L at end of session and maintaining 94%. Post-session BP 133/67      Pertinent Vitals/Pain Pain Assessment: Faces Faces Pain Scale: No hurt Pain Intervention(s): Monitored during session    Home Living                      Prior Function            PT Goals (current goals can now be found in the care plan section) Acute Rehab PT Goals Patient Stated Goal: unable PT Goal Formulation: Patient unable to participate in goal setting Time For Goal Achievement: 07/08/19 Potential to Achieve Goals: Fair Progress towards PT goals: Progressing toward goals    Frequency    Min 4X/week      PT Plan Discharge plan needs to be updated;Frequency needs to be updated    Co-evaluation PT/OT/SLP Co-Evaluation/Treatment: Yes Reason for Co-Treatment: Complexity of the patient's impairments (multi-system involvement);For patient/therapist safety PT goals addressed during session: Mobility/safety with mobility;Balance OT goals addressed during session: Strengthening/ROM      AM-PAC PT "6 Clicks" Mobility   Outcome Measure  Help needed turning from your back to your side while in a flat bed without using bedrails?: A Lot Help needed moving from lying on your back to sitting on the side of a flat bed without using bedrails?: Total Help needed moving to and from a bed to a chair (including a wheelchair)?: Total Help needed standing up from a chair using your arms (e.g., wheelchair or bedside chair)?: Total Help needed to walk in hospital room?: Total Help needed climbing 3-5 steps with a railing? : Total 6 Click Score: 7    End  of Session Equipment Utilized During Treatment: Oxygen;Gait belt Activity Tolerance: Patient tolerated treatment well Patient left: in bed;with call bell/phone within reach;with family/visitor present;with bed alarm set Nurse Communication: Mobility status PT Visit Diagnosis: Muscle weakness (generalized) (M62.81);Difficulty in walking, not elsewhere classified (R26.2);Unsteadiness on feet (R26.81);Other symptoms and signs involving the nervous system (R29.898);Hemiplegia and hemiparesis Hemiplegia - Right/Left: Right Hemiplegia - dominant/non-dominant: Dominant Hemiplegia - caused by: Cerebral infarction     Time: 1059-1130 PT Time Calculation (min) (ACUTE ONLY): 31 min  Charges:  $Therapeutic Activity: 8-22 mins                    Mabeline Caras, PT, DPT Acute Rehabilitation Services  Pager [phone] Office [place] 06/28/2019, 1:03 PM

## 2019-06-28 NOTE — Progress Notes (Signed)
Modified Barium Swallow Progress Note  Patient Details  Name: Levi Pruitt MRN: [id number] Date of Birth: 08-03-1945  Today's Date: 06/28/2019  Modified Barium Swallow completed.  Full report located under Chart Review in the Imaging Section.  Brief recommendations include the following:  Clinical Impression  Pt was seen for a Modified Barium Swallow Study and he presents with moderate oropharyngeal dysphagia with resultant silent aspiration of thin liquid and deep laryngeal penetration with nectar-thick liquid on today's examination.    Laryngeal penetration and aspiration were secondary to premature spillage to the laryngeal vestibule/pyriform sinuses with a delayed swallow initiation and delayed laryngeal closure.  No laryngeal penetration or aspiration was observed with honey-thick liquid or pureed solids.  Pt was observed to be lethargic during this evaluation and he required verbal encouragement and extra time for AP transport and swallow initiation.  He was unable to trigger a swallow with tsp of thin liquid or honey-thick liquid and he instead pocketed the boluses in his anterior sulci. Oral phase was remarkable for reduced lingual control resulting in premature spillage to the larynx/pharynx, reduced lingual coordination resulting in lingual pumping, and reduced lingual strength resulting in trace-moderate oral residue.  Intermittent piecemeal deglutition was also observed.  Pharyngeal phase was remarkable for reduced BOT retraction and reduced hyolaryngeal excursion resulting in vallecular residue.   Of note, cortrak was in place for this evaluation, but it did not interfere with epiglottic inversion.    Recommend initiation of Dysphagia 1 (puree) solids and honey-thick liquids with the following compensatory strategies: 1) Awake/alert 2) Small bites/sips 3) Sit upright as possible 4) Intermittent dry swallows 5) Check for oral clearance.  Despite initiation of a PO diet, there are still  concerns that the patient may not meet his nutritional needs via PO intake alone secondary to lethargy.  Spoke with MD regarding results and recommendations who additionally relayed them to palliative care NP.  SLP will f/u to monitor diet tolerance and for diagnostic treatment per POC.    Swallow Evaluation Recommendations       SLP Diet Recommendations: Dysphagia 1 (Puree) solids;Honey thick liquids;Alternative means - temporary   Liquid Administration via: Cup;Straw   Medication Administration: Crushed with puree   Supervision: Full assist for feeding;Full supervision/cueing for compensatory strategies   Compensations: Slow rate;Small sips/bites;Effortful swallow   Postural Changes: Seated upright at 90 degrees   Oral Care Recommendations: Oral care BID;Staff/trained caregiver to provide oral care   Other Recommendations: Order thickener from pharmacy;Remove water pitcher   Colin Mulders M.S., [place] Acute Rehabilitation Services Office: [phone]  Elvia Collum Lennin Osmond 06/28/2019,2:18 PM

## 2019-06-29 ENCOUNTER — Inpatient Hospital Stay (HOSPITAL_COMMUNITY): Payer: Medicare Other

## 2019-06-29 LAB — BASIC METABOLIC PANEL
Anion gap: 10 (ref 5–15)
BUN: 21 mg/dL (ref 8–23)
CO2: 25 mmol/L (ref 22–32)
Calcium: 8.1 mg/dL — ABNORMAL LOW (ref 8.9–10.3)
Chloride: 108 mmol/L (ref 98–111)
Creatinine, Ser: 1.18 mg/dL (ref 0.61–1.24)
GFR calc Af Amer: >60 mL/min (ref >60)
GFR calc non Af Amer: >60 mL/min (ref >60)
Glucose, Bld: 97 mg/dL (ref 70–99)
Potassium: 3.6 mmol/L (ref 3.5–5.1)
Sodium: 143 mmol/L (ref 135–145)

## 2019-06-29 LAB — CBC WITH DIFFERENTIAL/PLATELET
Abs Immature Granulocytes: 0.27 10*3/uL — ABNORMAL HIGH (ref 0.00–0.07)
Basophils Absolute: 0.1 10*3/uL (ref 0.0–0.1)
Basophils Relative: 1 %
Eosinophils Absolute: 0.2 10*3/uL (ref 0.0–0.5)
Eosinophils Relative: 1 %
HCT: 40.8 % (ref 39.0–52.0)
Hemoglobin: 13.1 g/dL (ref 13.0–17.0)
Immature Granulocytes: 2 %
Lymphocytes Relative: 9 %
Lymphs Abs: 1.4 10*3/uL (ref 0.7–4.0)
MCH: 31.3 pg (ref 26.0–34.0)
MCHC: 32.1 g/dL (ref 30.0–36.0)
MCV: 97.4 fL (ref 80.0–100.0)
Monocytes Absolute: 1.6 10*3/uL — ABNORMAL HIGH (ref 0.1–1.0)
Monocytes Relative: 10 %
Neutro Abs: 12.7 10*3/uL — ABNORMAL HIGH (ref 1.7–7.7)
Neutrophils Relative %: 77 %
Platelets: 293 10*3/uL (ref 150–400)
RBC: 4.19 MIL/uL — ABNORMAL LOW (ref 4.22–5.81)
RDW: 13.1 % (ref 11.5–15.5)
WBC: 16.2 10*3/uL — ABNORMAL HIGH (ref 4.0–10.5)
nRBC: 0 % (ref 0.0–0.2)

## 2019-06-29 LAB — GLUCOSE, CAPILLARY
Glucose-Capillary: 95 mg/dL (ref 70–99)
Glucose-Capillary: 93 mg/dL (ref 70–99)
Glucose-Capillary: 88 mg/dL (ref 70–99)
Glucose-Capillary: 105 mg/dL — ABNORMAL HIGH (ref 70–99)
Glucose-Capillary: 108 mg/dL — ABNORMAL HIGH (ref 70–99)
Glucose-Capillary: 93 mg/dL (ref 70–99)

## 2019-06-29 NOTE — Progress Notes (Signed)
Patient ID: Levi Pruitt, male   DOB: May 15, 1946, 74 y.o.   MRN: [id number]  This NP visited patient at the bedside as a follow up for  Palliative  Medicine  needs and emotional support.   Patient is weak, he wakes to vigorous verbal and tactile  stimulation.  Per nursing he is only tolerating approximately 25% of tray.     Spoke with   SO/[name]/[name], for continued conversation regarding diagnosis, prognosis, treatment option decisions, advanced directive decisions,  disposition and anticipatory care needs.  Per nursing he is only tolerating approximately 25% of tray.  He is high risk for decompensation.  I shared my concern for long term poor prognosis and meaningful recovery  Molli Knock tells me as dicussed with [name] family is open to PEG placement.  They feel this will give him the "best chance " for improvement.     Again  we discussed risk and benefits of PEG and the importance of placing a  time frame consideration/trial before intervention and along the course of the patient's medical path.  Discussed that patient remains high risk for aspiration, simply on his own secretions.  Plan of Care: -Full code-      recommended DNR/DNI understanding poor           outcomes in similar patients, family will considered - open to PEG placement  -family is open to all offered and available medical interventions to prolong life.  They are hopeful for recovery   Patient has a docuemtebnd [name] and a Declaration of a Desire for a Natrual Death   Discussed with [name] the importance of continued conversation with family and the  medical providers regarding overall plan of care and treatment options,  ensuring decisions are within the context of the patients values and GOCs.   Education and emotional support offered  Questions and concerns addressed   Discussed with [name]  Total time spent on the unit was 35 minutes  Greater than 50% of the time was spent in counseling and coordination of  care  [name] NP  Palliative Medicine Team Team Phone # [phone] Pager [phone]

## 2019-06-29 NOTE — Progress Notes (Signed)
SLP Cancellation Note  Patient Details Name: Aslan Burditt MRN: [id number] DOB: Sep 16, 1945   Cancelled treatment:       Reason Eval/Treat Not Completed: Other (comment) Attempted x2 to see pt thus far - pt sleeping soundly in AM, with PT in PM. Will continue efforts.  Yates Weisgerber B. Quentin Ore, [facility], [place] Speech Language Pathologist Office: [phone] Pager: [phone]  Shonna Chock 06/29/2019, 12:18 PM

## 2019-06-29 NOTE — Progress Notes (Signed)
Physical Therapy Treatment Patient Details Name: Levi Pruitt MRN: [id number] DOB: August 22, 1945 Today's Date: 06/29/2019    History of Present Illness Pt is a 74 yo male admitted 06/18/19 with a stroke due to embolism of L PCA on 06/18/19. 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 Since admission, pt has been dx with possible pneumonia, acute on chronic CHF, AKI, HLD, and inferior wall STEMI. No PMH on file.    PT Comments    Pt seen with daughter present. Pt initiated more with transfers however continues to require max-totalx2. Focused on truncal control and EOB balance this session. All worked on finding objects on the R side. Pt requiring max verbal and tactile cues to turn head to the R. Pt continues with severe R sided neglect. Spoke at length with daughter regarding d/c planning and recommendations and that her family should be discussing who could provide 24/7 as patient will most likely need 24/7 assist even after rehab. If they can't provide that level of assist then they need to consider long term care. Daughter very appreciative of information and explanation of CIR vs SNF. Acute PT to cont to follow.    Follow Up Recommendations  CIR;Supervision/Assistance - 24 hour     Equipment Recommendations  Wheelchair (measurements PT);Wheelchair cushion (measurements PT)    Recommendations for Other Services Rehab consult     Precautions / Restrictions Precautions Precautions: Fall Precaution Comments: dense R hemiparesis with inattention Restrictions Weight Bearing Restrictions: No    Mobility  Bed Mobility Overal bed mobility: Needs Assistance Bed Mobility: Rolling;Sidelying to Sit;Sit to Supine Rolling: Mod assist Sidelying to sit: Max assist;+2 for physical assistance   Sit to supine: Max assist;+2 for physical assistance    General bed mobility comments: with verbal cues pt used L UE to roll self to the R via bed rail, pt total for rolling to the L due to unable to use R UE/LE functionally. pt initiated pushing self up into sitting and transferring to supine, assist required for R LE management  Transfers                 General transfer comment: deferred today  Ambulation/Gait                 Stairs             Wheelchair Mobility    Modified Rankin (Stroke Patients Only) Modified Rankin (Stroke Patients Only) Pre-Morbid Rankin Score: No symptoms Modified Rankin: Severe disability     Balance Overall balance assessment: Needs assistance Sitting-balance support: Single extremity supported;Feet supported Sitting balance-Leahy Scale: Poor Sitting balance - Comments: pt with strong R lateral lean, able to correct with verbal cues but unable to maintain, pt requiring maxA to maitnain EOB balance majority of time   Standing balance support: Bilateral upper extremity supported Standing balance-Leahy Scale: Zero                              Cognition Arousal/Alertness: Awake/alert Behavior During Therapy: Flat affect Overall Cognitive Status: Impaired/Different from baseline Area of Impairment: Orientation;Attention;Following commands;Safety/judgement;Awareness;Problem solving                 Orientation Level: Disoriented to;Place;Time;Situation(pt re-oriented) Current Attention Level: Sustained   Following Commands: Follows one step commands with increased time Safety/Judgement: Decreased awareness of safety;Decreased  awareness of deficits Awareness: Intellectual Problem Solving: Slow processing;Difficulty sequencing;Requires verbal cues;Requires tactile cues;Decreased initiation General Comments: pt with severe R sided neglect however with verbal cues pt able to cross midline today, pt following simple commands majority of time      Exercises       General Comments General comments (skin integrity, edema, etc.): pt s/p loose stool bowel movement, dependent for hygiene      Pertinent Vitals/Pain Pain Assessment: Faces Faces Pain Scale: No hurt    Home Living                      Prior Function            PT Goals (current goals can now be found in the care plan section) Progress towards PT goals: Progressing toward goals    Frequency    Min 4X/week      PT Plan Current plan remains appropriate    Co-evaluation              AM-PAC PT "6 Clicks" Mobility   Outcome Measure  Help needed turning from your back to your side while in a flat bed without using bedrails?: A Lot Help needed moving from lying on your back to sitting on the side of a flat bed without using bedrails?: Total Help needed moving to and from a bed to a chair (including a wheelchair)?: Total Help needed standing up from a chair using your arms (e.g., wheelchair or bedside chair)?: Total Help needed to walk in hospital room?: Total Help needed climbing 3-5 steps with a railing? : Total 6 Click Score: 7    End of Session Equipment Utilized During Treatment: Oxygen Activity Tolerance: Patient tolerated treatment well Patient left: in bed;with call bell/phone within reach;with family/visitor present;with bed alarm set Nurse Communication: Mobility status PT Visit Diagnosis: Muscle weakness (generalized) (M62.81);Difficulty in walking, not elsewhere classified (R26.2);Unsteadiness on feet (R26.81);Other symptoms and signs involving the nervous system (R29.898);Hemiplegia and hemiparesis Hemiplegia - Right/Left: Right Hemiplegia - dominant/non-dominant: Dominant Hemiplegia - caused by: Cerebral infarction     Time: 1200-1234 PT Time Calculation (min) (ACUTE ONLY): 34 min  Charges:  $Therapeutic Activity: 8-22 mins $Neuromuscular Re-education: 8-22 mins                     Kittie Plater, PT, DPT Acute Rehabilitation  Services Pager #: [phone] Office #: [phone]    Berline Lopes 06/29/2019, 2:14 PM

## 2019-06-29 NOTE — Progress Notes (Signed)
Calorie Count Note  48 hour calorie count ordered. Day 1 results.  Please document in flowsheets as RD is working remotely.  Diet: Dysphagia 1, honey thick liquids Supplements:  -Magic cup TID with meals, each supplement provides 290 kcal and 9 grams of protein  1/18-1/19: Breakfast (1/19): 0% Lunch: minimal intakes -pocketing food per report from RN Dinner: minimal intakes- pocketing Supplements: none  Total intake: Minimal intakes, unable to calculate  Messaged RN, states no breakfast was consumed this AM. Will feed pt at lunch today and let RD know how it went.   Nutrition Dx: Inadequate oral intake related to dysphagia(s/p CVA) as evidenced by NPO status -Now on dysphagia diet.  Goal: Pt to meet >/= 90% of their estimated nutrition needs   Intervention:  -Continue Calorie Count -Magic cup TID with meals, each supplement provides 290 kcal and 9 grams of protein -Will likely need PEG to meet nutritional needs,if within [place], MS, RD, LDN Inpatient Clinical Dietitian Pager: [phone] After Hours Pager: [phone]

## 2019-06-29 NOTE — Progress Notes (Signed)
Hospitalist progress note  Patient is from home, disposition likely to either CIR or skilled facility-Will need PEG tube placed which is main barrier as well as coordination of insurances for either venue Levi Pruitt [id number] DOB: 05/16/1946 DOA: 06/18/2019  PCP: Patient, No Pcp Per   Narrative:  50 white male-known history of obesity BMI 32 presented code stroke 06/18/2019-CT = large acute PCA infarct EKG = ST elevation MI with reciprocal changes-patient started on heparin Cardiology and neurology consulted Because of dense hemiparesis as well as prior advanced care directives stating he would not want artificial nutrition, palliative care was consulted He is currently being treated for probable aspiration pneumonia in addition to volume depletion secondary to inadequate p.o. intake   Data Reviewed:  Sodium 145-->143, bicarb 25 BUN/creatinine 33/1.2-->21/1.18 White count down from 18.4-15.3--> 14.0-->16.2  hemoglobin 13.1, platelets293 Echocardiogram 06/19/2019 = EF 25-30% decreased function grade 3 diastolic dysfunction CXR 8/42/10 cardiomegaly with vascular congestion CXR 1/13 stable cardiomegaly-minimal pulm vasc congestion pulm edema CXR 1/14 increasing upper lobe area zone opacities right greater than left bronchovascular markings are increased CXR 1/15 continued worsening airspace opacities--mostly RUL  Assessment & Plan: Large acute PCA infarct Significant deficits with dense hemiparesis/plegia on the left side n.p.o--core track feeds in addition to free water on hold since 1/14 Continue aspirin Plavix at this time Worsened aspiration pneumonia Dysphagia status post placement of NG tube Very dense hemiplegia--recurrent aspiration risk -?  RT had to deep suction several times on 1/15-Since 1/15  hold feeds, free water CXR 1/14 shows worsening with right upper lobe infiltrates-azithromycin discontinued 1/17 continue ceftriaxone and transition to oral meds once PEG tube is  placed Speech therapy, palliative medicine and attending physician have all discussed with the family and [facility] POA understands risk benefits and alternatives to PEG tube placement which will be performed likely 1/20 Continue IV D5 40 cc/H for now and likely can give free water via PEG tube once it is placed Acute inferior STEMI Initially heparin- troponins trended up- cardiology did not feel was a candidate for anything but medical interventions Heparin IV was discontinued on 11/10-needs repeat echocardiogram 1/16 Coreg held 2/2 hypotension--change dose to once daily much lower dose--HOLD ACE for now Systolic, diastolic heart failure with pulmonary edema Present still on x-ray along with pneumonia Treat as able-given Lasix x1 1/15 Monitor trends periodically---She has she is net I/O is +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, mild hypernatremia, mild metabolic acidosis As above hold free water--- symptomatic treatment if possible May not be able to prevent hypernatremia 8 mm parotid neoplasm Outpatient follow-up if does well will need discussion about the same yeast intertriginous Started Diflucan 100 daily for the next 7 days  BMI >30  Prophylactic Lovenox, inpatient-met with daughter in the room and palliative care has discussed clearly PEG tube placement with [facility] POA  Subjective:  Continues to be more coherent and awake Does not seem to be in distress dense deficits persist  Objective: Vitals:   06/29/19 0510 06/29/19 0728 06/29/19 1102 06/29/19 1200  BP:  (!) 107/49 (!) 104/58 (!) 96/56  Pulse: 84 60 (!) 56 (!) 56  Resp: 19 (!) _0 Temp:  97.7 F (36.5 C) 98.8 F (37.1 C) (!) 97.5 F (36.4 C)  TempSrc:  Oral Axillary Oral  SpO2: (!) 89% 93% 94% 94%  Weight:      Height:        Intake/Output Summary (Last 24 hours) at 06/29/2019 1451 Last data filed at 06/29/2019 0600 Gross per 24  hour  Intake 1098.85 ml  Output 650 ml  Net 448.85 ml   Filed Weights   06/24/19 0457 06/25/19 0500  06/29/19 0500  Weight: 94.3 kg 94.5 kg 96.6 kg    Examination:  Persistent facial twisting to the right side EOMI NCAT Poor chest exam--no rales S1-S2 no murmur---Seems to be sinus, sinus tach on exam No lower extremity edema Some intertriginous areas which appear to be yeast Dense right-sided deficits persist  Scheduled Meds: .  stroke: mapping our early stages of recovery book   Does not apply Once  . aspirin  324 mg Oral Daily  . atorvastatin  80 mg Oral q1800  . carvedilol  10 mg Oral Daily  . clopidogrel  75 mg Per Tube Daily  . fluconazole  100 mg Oral Daily  . heparin  5,000 Units Subcutaneous Q8H   Continuous Infusions: . cefTRIAXone (ROCEPHIN)  IV 1 g (06/29/19 1322)  . dextrose 40 mL/hr at 06/28/19 2326     LOS: 11 days   Time spent: [place], MD [facility]  06/29/2019, 2:51 PM

## 2019-06-29 NOTE — Progress Notes (Signed)
  Speech Language Pathology Treatment: Dysphagia;Cognitive-Linquistic  Patient Details Name: Levi Pruitt MRN: [id number] DOB: Jun 05, 1946 Today's Date: 06/29/2019 Time: [id number] SLP Time Calculation (min) (ACUTE ONLY): 60 min  Assessment / Plan / Recommendation Clinical Impression  Pt was seen at bedside for skilled ST intervention targeting goals for po tolerance and improved communicative effectiveness. Daughter was present throughout this session.  DYSPHAGIA: Pt was seen during lunch with puree solids and honey thick liquids. I provided ongoing education to daughter during the meal. Pt was fed, alternating solids and liquids, at a slow rate. Slight anterior leakage noted on the left. Extended oral prep noted as well. Pt benefits from cues to swallow again to clear oral cavity. I provided education regarding MBS results from yesterday to pt and daughter and placed safe swallow precautions at the [facility]. Mentation continues to be a significant hurdle regarding pt ability/safety to meet nutrition/hydration needs PO. I provided education regarding PEG placement to be used to insure adequate and safe intake, while continuing PO presentations for oral satisfaction and therapy. No overt s/s aspiration observed on puree or honey thick liquid trials.  APHASIA: Pt was able to answer simple questions, and verbalize short phrases with fairly good intelligibility. He was able to count and tell me his name, as well as to ask for another bolus of food. He had difficulty following directions during oral care, specifically, he was unable to close his lips around the yankauer even with verbal and tactile cues. 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 Contined ST intervention is recommended to maximize swallow function and safety, and improve communicative effectiveness. Family education will continue as well.     HPI HPI:  74 year old Caucasian male,  obese, with no other documented past medical history. Patient is not able to provide and history.  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      SLP Plan  Continue with current plan of care       Recommendations  Diet recommendations: Dysphagia 1 (puree);Honey-thick liquid Liquids provided via: Cup;Teaspoon Medication Administration: Crushed with puree Supervision: Full supervision/cueing for compensatory strategies Compensations: Slow rate;Small sips/bites;Effortful swallow;Minimize environmental distractions;Monitor for anterior loss Postural Changes and/or Swallow Maneuvers: Seated upright 90 degrees;Upright 30-60 min after meal                Oral Care Recommendations: Oral care QID Follow up Recommendations: Inpatient Rehab SLP Visit Diagnosis: Dysphagia, oropharyngeal phase (R13.12) Plan: Continue with current plan of care       GO               Deasha Clendenin B. Quentin Ore, [facility], [place] Speech Language Pathologist Office: [phone] Pager: [phone]   Shonna Chock 06/29/2019, 1:54 PM

## 2019-06-30 LAB — GLUCOSE, CAPILLARY
Glucose-Capillary: 109 mg/dL — ABNORMAL HIGH (ref 70–99)
Glucose-Capillary: 89 mg/dL (ref 70–99)
Glucose-Capillary: 89 mg/dL (ref 70–99)
Glucose-Capillary: 106 mg/dL — ABNORMAL HIGH (ref 70–99)
Glucose-Capillary: 104 mg/dL — ABNORMAL HIGH (ref 70–99)

## 2019-06-30 NOTE — Plan of Care (Signed)
Pt reviewed these topics/goals with pt and family. They verbalized understanding.

## 2019-06-30 NOTE — Progress Notes (Signed)
Physical Therapy Treatment Patient Details Name: Levi Pruitt MRN: [id number] DOB: 04-10-1946 Today's Date: 06/30/2019    History of Present Illness Pt is a 74 yo male admitted 06/18/19 with a stroke due to embolism of L PCA on 06/18/19. 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 Since admission, pt has been dx with possible pneumonia, acute on chronic CHF, AKI, HLD, and inferior wall STEMI. No PMH on file.    PT Comments    Pt more alert and vocal today. Pt remains only oriented to self. Pt re-oriented to place, date and situation. Pt remains to have dense R hemiplegia and neglect. With max verbal and tactile cues pt able to tend towards R side for seconds. Pt with no active movement of R UE or LE and unable to use functionally. Focused on trunk control and EOB balance. Pt was able to maintain EOB balance a few times with close min guard for about 20 seconds however remains to require maxA majority of time. Pt also with attempt to righting reaction with fall to R side however unable to physically bring self up to neutral. Acute PT to cont to follow.    Follow Up Recommendations  CIR;Supervision/Assistance - 24 hour     Equipment Recommendations  Wheelchair (measurements PT);Wheelchair cushion (measurements PT)    Recommendations for Other Services Rehab consult     Precautions / Restrictions Precautions Precautions: Fall Precaution Comments: dense R hemiparesis with inattention Restrictions Weight Bearing Restrictions: No    Mobility  Bed Mobility Overal bed mobility: Needs Assistance Bed Mobility: Rolling;Sidelying to Sit;Sit to Supine Rolling: Max assist Sidelying to sit: Total assist;+2 for physical assistance   Sit to supine: Total assist;+2 for physical assistance   General bed mobility comments: requires max verbal and tactile  cues to complete task to roll to the L and to the R  Transfers                 General transfer comment: deferred today  Ambulation/Gait                 Stairs             Wheelchair Mobility    Modified Rankin (Stroke Patients Only) Modified Rankin (Stroke Patients Only) Pre-Morbid Rankin Score: No symptoms Modified Rankin: Severe disability     Balance Overall balance assessment: Needs assistance Sitting-balance support: Single extremity supported;Feet supported Sitting balance-Leahy Scale: Poor Sitting balance - Comments: focused on trunk control and finding midline posture and maintaining it. Pt majority of time requiring maxA to maintain EOB balance however with a few episodes of maintaining EOB with close min guard for approx 30 sec                                    Cognition Arousal/Alertness: Awake/alert Behavior During Therapy: Flat affect Overall Cognitive Status: Impaired/Different from baseline Area of Impairment: Orientation;Attention;Following commands;Safety/judgement;Awareness;Problem solving                 Orientation Level: Disoriented to;Place;Time;Situation(pt re-oriented) Current Attention Level: Sustained   Following Commands: Follows one step commands inconsistently;Follows one step commands with increased time Safety/Judgement: Decreased awareness of safety;Decreased awareness of deficits Awareness: Intellectual Problem Solving: Slow processing;Difficulty sequencing;Requires verbal cues;Requires tactile cues;Decreased initiation General Comments: pt more vocal today, however  still muffled. Pt with verbal cues able to turn head towards right, pt following simple commands 50% of time      Exercises      General Comments General comments (skin integrity, edema, etc.): pt with loose stool. Pt's bottom and between his LEs with red rash mostly likely irritated from freq loose stools      Pertinent  Vitals/Pain Pain Assessment: Faces    Home Living                      Prior Function            PT Goals (current goals can now be found in the care plan section) Acute Rehab PT Goals Patient Stated Goal: unable Progress towards PT goals: Progressing toward goals    Frequency    Min 4X/week      PT Plan Current plan remains appropriate    Co-evaluation PT/OT/SLP Co-Evaluation/Treatment: Yes Reason for Co-Treatment: Complexity of the patient's impairments (multi-system involvement) PT goals addressed during session: Mobility/safety with mobility        AM-PAC PT "6 Clicks" Mobility   Outcome Measure  Help needed turning from your back to your side while in a flat bed without using bedrails?: A Lot Help needed moving from lying on your back to sitting on the side of a flat bed without using bedrails?: Total Help needed moving to and from a bed to a chair (including a wheelchair)?: Total Help needed standing up from a chair using your arms (e.g., wheelchair or bedside chair)?: Total Help needed to walk in hospital room?: Total Help needed climbing 3-5 steps with a railing? : Total 6 Click Score: 7    End of Session Equipment Utilized During Treatment: Oxygen Activity Tolerance: Patient tolerated treatment well Patient left: in bed;with call bell/phone within reach;with family/visitor present;with bed alarm set Nurse Communication: Mobility status PT Visit Diagnosis: Muscle weakness (generalized) (M62.81);Difficulty in walking, not elsewhere classified (R26.2);Unsteadiness on feet (R26.81);Other symptoms and signs involving the nervous system (R29.898);Hemiplegia and hemiparesis Hemiplegia - Right/Left: Right Hemiplegia - dominant/non-dominant: Dominant Hemiplegia - caused by: Cerebral infarction     Time: [id number] PT Time Calculation (min) (ACUTE ONLY): 38 min  Charges:  $Neuromuscular Re-education: 8-22 mins                     Kittie Plater, PT,  DPT Acute Rehabilitation Services Pager #: [phone] Office #: [phone]    Berline Lopes 06/30/2019, 8:41 AM

## 2019-06-30 NOTE — Progress Notes (Signed)
Occupational Therapy Treatment Patient Details Name: Levi Pruitt MRN: [id number] DOB: [birth year] Today's Date: 06/30/2019    History of present illness Pt is a 74 yo male admitted 06/18/19 with a stroke due to embolism of L PCA on 06/18/19. 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 Since admission, pt has been dx with possible pneumonia, acute on chronic CHF, AKI, HLD, and inferior wall STEMI. No PMH on file.   OT comments  Patient progressing slowly towards goals.  He is more vocal and alert today, oriented to self only.  Remains flaccid on R UE/LE with neglect.  Able to scan towards R side towards auditory stimuli, but unable to locate # on fingers past midline (able to on L side).  Patient requires max to total assist +2 for bed mobility and sitting EOB with overall max-total assist but at times able to hold static sitting with min-min guard with L UE support.  Engaged in grooming at EOB with max assist, hand over hand to LUE, total assist for hygiene supine.  Will follow acutely.    Follow Up Recommendations  CIR;Supervision/Assistance - 24 hour    Equipment Recommendations  Other (comment)(TBD at next venue of care)    Recommendations for Other Services Rehab consult    Precautions / Restrictions Precautions Precautions: Fall Precaution Comments: dense R hemiparesis with inattention Restrictions Weight Bearing Restrictions: No       Mobility Bed Mobility Overal bed mobility: Needs Assistance Bed Mobility: Rolling;Sidelying to Sit;Sit to Supine Rolling: Max assist Sidelying to sit: Total assist;+2 for physical assistance   Sit to supine: Total assist;+2 for physical assistance   General bed mobility comments: requires max verbal and tactile cues to complete task to roll to the L and to the R  Transfers                  General transfer comment: deferred today    Balance Overall balance assessment: Needs assistance Sitting-balance support: Single extremity supported;Feet supported Sitting balance-Leahy Scale: Poor Sitting balance - Comments: focused on trunk control and finding midline posture and maintaining it. Pt majority of time requiring maxA to maintain EOB balance however with a few episodes of maintaining EOB with close min guard for approx 30 sec                                   ADL either performed or assessed with clinical judgement   ADL Overall ADL's : Needs assistance/impaired     Grooming: Maximal assistance;Sitting Grooming Details (indicate cue type and reason): min-total assist x 1 for sitting balance, EOB max assist with hand over hand support to LUE to wash face and comb hair                      Toileting- Clothing Manipulation and Hygiene: Total assistance;+2 for safety/equipment;+2 for physical assistance;Bed level Toileting - Clothing Manipulation Details (indicate cue type and reason): total assist for bed level hygiene      Functional mobility during ADLs: Maximal assistance;+2 for physical assistance       Vision   Additional Comments: patient responsive to sound in order to scan towards R side with increased time and cueing; able to verbalize # of fingers in L visual field but not R visual field  Perception     Praxis      Cognition Arousal/Alertness: Awake/alert Behavior During Therapy: Flat affect Overall Cognitive Status: Impaired/Different from baseline Area of Impairment: Orientation;Attention;Following commands;Safety/judgement;Awareness;Problem solving                 Orientation Level: Disoriented to;Place;Time;Situation(pt re-oriented) Current Attention Level: Sustained   Following Commands: Follows one step commands inconsistently;Follows one step commands with increased time Safety/Judgement: Decreased awareness of  safety;Decreased awareness of deficits Awareness: Intellectual Problem Solving: Slow processing;Difficulty sequencing;Requires verbal cues;Requires tactile cues;Decreased initiation General Comments: pt more vocal today, however still muffled. Pt with verbal cues able to turn head towards right, pt following simple commands 50% of time        Exercises Exercises: Other exercises Other Exercises Other Exercises: PROM of R UE, R lateral lean weightbearing on elbow x 5  reps total assist +2 with no intiation   Shoulder Instructions       General Comments assisted with hygiene of loose stool; informed RN of red rash between his legs and on his bottom     Pertinent Vitals/ Pain       Pain Assessment: Faces Faces Pain Scale: No hurt  Home Living                                          Prior Functioning/Environment              Frequency  Min 2X/week        Progress Toward Goals  OT Goals(current goals can now be found in the care plan section)  Progress towards OT goals: Progressing toward goals  Acute Rehab OT Goals Patient Stated Goal: unable OT Goal Formulation: Patient unable to participate in goal setting  Plan Discharge plan remains appropriate;Frequency remains appropriate    Co-evaluation    PT/OT/SLP Co-Evaluation/Treatment: Yes Reason for Co-Treatment: Complexity of the patient's impairments (multi-system involvement) PT goals addressed during session: Mobility/safety with mobility OT goals addressed during session: ADL's and self-care      AM-PAC OT "6 Clicks" Daily Activity     Outcome Measure   Help from another person eating meals?: Total Help from another person taking care of personal grooming?: A Lot Help from another person toileting, which includes using toliet, bedpan, or urinal?: Total Help from another person bathing (including washing, rinsing, drying)?: Total Help from another person to put on and taking off regular  upper body clothing?: Total Help from another person to put on and taking off regular lower body clothing?: Total 6 Click Score: 7    End of Session Equipment Utilized During Treatment: Oxygen(2L)  OT Visit Diagnosis: Other abnormalities of gait and mobility (R26.89);Muscle weakness (generalized) (M62.81);Hemiplegia and hemiparesis;Cognitive communication deficit (R41.841);Other symptoms and signs involving cognitive function Symptoms and signs involving cognitive functions: Cerebral infarction Hemiplegia - Right/Left: Right Hemiplegia - dominant/non-dominant: Dominant Hemiplegia - caused by: Cerebral infarction   Activity Tolerance Patient tolerated treatment well   Patient Left in bed;with call bell/phone within reach;with bed alarm set   Nurse Communication Mobility status        Time: [id number] OT Time Calculation (min): 37 min  Charges: OT General Charges $OT Visit: 1 Visit OT Treatments $Self Care/Home Management : 8-22 mins  Jolaine Artist, OT [place] Pager [phone] Office [phone]    Delight Stare 06/30/2019, 9:51 AM

## 2019-06-30 NOTE — Progress Notes (Signed)
Nutrition Follow-up  DOCUMENTATION CODES:   Obesity unspecified  INTERVENTION:   Monitor magnesium, potassium, and phosphorus daily for at least 3 days, MD to replete as needed, as pt is at risk for refeeding syndrome.  Once PEG placed: -Recommend Osmolite 1.5 @ 20 ml/hr, advance by 10 ml every 4 hours to goal rate of 60 ml/hr. -Free water flushes of 200 ml QID (800 ml) -This will provide 2160 kcals, 90g protein and 1897 ml H2O.  -D/c Calorie Count -Magic cup TID with meals, each supplement provides 290 kcal and 9 grams of protein.  NUTRITION DIAGNOSIS:   Inadequate oral intake related to dysphagia(s/p CVA) as evidenced by NPO status.  Now on dysphagia 1 diet.   GOAL:   Patient will meet greater than or equal to 90% of their needs  Not meeting.  MONITOR:   PO intake, Supplement acceptance, Diet advancement, Skin, Weight trends, Labs, I & O's  REASON FOR ASSESSMENT:   Consult Calorie Count  ASSESSMENT:   74 year old Caucasian male, obese, with no other documented past medical history. Patient is not able to provide and history.  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  1/9: admitted for acute CVA 1/11: Small bore feeding tube placed by diagnostic radiology, tip of tube in distal duodenum.  1/12: tube feeding initiated 1/14: TF held d/t aspiration pneumonia 1/18: s/p MBS -moderate oropharyngeal dysphagia w/ silent aspiration  **RD working remotely**  Nothing documented for day 2 of Calorie count. Will d/c.  Per palliative care note, pt consumed ~25% of lunch tray on 1/19 (this would provide ~230 kcals,  8g protein). Family has opted to have PEG tube placed. Per MD note, PEG may be placed today.  Per SLP note, pt's mentation main barrier to meeting nutritional  needs.  Admission weight: 214 lbs. Current weight: 211 lbs.  I/Os: +633 ml since admit UOP: 400 ml x 24 hrs  Medications: D5 infusion Labs reviewed:  CBGs: 89-109  Diet Order:   Diet Order            DIET - DYS 1 Room service appropriate? No; Fluid consistency: Honey Thick  Diet effective now              EDUCATION NEEDS:   No education needs have been identified at this time  Skin:  Skin Assessment: Reviewed RN Assessment  Last BM:  1/19 -type 7  Height:   Ht Readings from Last 1 Encounters:  06/20/19 5\' 9"  (1.753 m)    Weight:   Wt Readings from Last 1 Encounters:  06/30/19 95.9 kg    Ideal Body Weight:  72.7 kg  BMI:  Body mass index is 31.22 kg/m.  Estimated Nutritional Needs:   Kcal:  2000-2200  Protein:  90-105g  Fluid:  2L/day  Clayton Bibles, MS, RD, LDN Inpatient Clinical Dietitian Pager: [phone] After Hours Pager: [phone]

## 2019-06-30 NOTE — Progress Notes (Signed)
[name]  PROGRESS NOTE    Levi Pruitt  [id number] DOB: [birth year] DOA: 06/18/2019 PCP: Patient, No Pcp Per   Brief Narrative:   47 white male-known history of obesity BMI 32 presented code stroke 06/18/2019-CT = large acute PCA infarct EKG = ST elevation MI with reciprocal changes-patient started on heparin Cardiology and neurology consulted Because of dense hemiparesis as well as prior advanced care directives stating he would not want artificial nutrition, palliative care was consulted He is currently being treated for probable aspiration pneumonia in addition to volume depletion secondary to inadequate p.o. intake  06/30/19: No acute events ON. Spoke with dtr at bedside. Family has agreed to PEG. Have consulted IR. Appreciate assistance. PT recs CIR. Working on options.    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     - NPO d/t dysphagia, to have PEG placed     - per neuro:  ASA 325 and plavix DAPT for 3 months and then ASA alone     - A1c 06/19/19: 5.3     - LDL: 99, HDL: 32, Tchol: 152  Aspiration pneumonia Dysphagia     - RT had to deep suction several times on 1/15     - CXR 1/14 shows worsening with right upper lobe infiltrates-azithromycin discontinued 1/17 continue ceftriaxone and transition to oral meds once PEG tube is placed     - Speech therapy, palliative medicine and attending physician have all discussed with the family and [facility] POA understands risk benefits  and alternatives to PEG tube placement which will be performed likely 1/20     - Continue IV D5 40 cc/H for now and likely can give free water via PEG tube once it is placed     - IR consulted for PEG; appreciate assistance  Acute inferior STEMI     - Initially heparin- troponins trended up     - cardiology did not feel was a candidate for anything but medical interventions Heparin IV was discontinued     - repeat echo 1/15: EF 30-35%. G3 diastolic dysfxn (essentially same as 06/19/19 echo)     - Coreg held d/t hypotension and then changed to coreg CR 10mg  daily     - ACEi held d/t BP  Systolic, diastolic heart failure with pulmonary edema     - last lasix 1/15; monitor daily wts, I&Os  CKD stage II  mild hypernatremia mild metabolic acidosis     - hypernatremia ok, monitor     - acidosis resolved, monitor     - renal fxn stable, monitor  8 mm parotid neoplasm     - Outpatient follow-up  yeast intertriginous     - diflucan 100mg  qday through 07/04/19   DVT prophylaxis: heparin Code Status: FULL Family Communication: With dtr at bedside.   Disposition Plan: Needs PEG. To CIR after?  Consultants:   IR  Palliative Care  ROS:  Unable to obtain d/t  mentation.  Subjective: No acute events ON  Objective: Vitals:   06/29/19 2326 06/29/19 2327 06/30/19 0427 06/30/19 0540  BP:      Pulse: 67     Resp:      Temp:  97.8 F (36.6 C) 98.1 F (36.7 C)   TempSrc:  Axillary    SpO2: 93%     Weight:    95.9 kg  Height:        Intake/Output Summary (Last 24 hours) at 06/30/2019 [id number] Last data filed at 06/30/2019 0600 Gross per 24 hour  Intake 1037.69 ml  Output 400 ml  Net 637.69 ml   Filed Weights   06/25/19 0500 06/29/19 0500 06/30/19 0540  Weight: 94.5 kg 96.6 kg 95.9 kg    Examination:  General: 74 y.o. male resting in bed in NAD Cardiovascular: RRR, +S1, S2, no m/g/r, equal pulses throughout Respiratory: CTABL, no w/r/r, normal WOB GI: BS+, NDNT, no masses  noted, HGT in place MSK: No e/c/c Neuro: alert, but sluggish; following some commands   Data Reviewed: I have personally reviewed following labs and imaging studies.  CBC: Recent Labs  Lab 06/24/19 0149 06/25/19 0319 06/26/19 0613 06/27/19 0528 06/29/19 0408  WBC 11.8* 14.0* 15.8* 16.1* 16.2*  NEUTROABS  --   --   --   --  12.7*  HGB 13.5 13.4 14.3 13.9 13.1  HCT 43.9 43.9 44.8 45.2 40.8  MCV 100.0 101.4* 99.6 99.8 97.4  PLT 364 337 319 307 0000000   Basic Metabolic Panel: Recent Labs  Lab 06/23/19 0731 06/24/19 0149 06/25/19 0319 06/26/19 [id number] 06/27/19 0528 06/28/19 0753 06/29/19 0408  NA  --    < > 148* 147* 145 144 143  K  --    < > 4.0 3.9 3.8 3.6 3.6  CL  --    < > 112* 110 109 108 108  CO2  --    < > 26 26 24 25 25   GLUCOSE  --    < > 114* 101* 101* 104* 97  BUN  --    < > 33* 31* 31* 26* 21  CREATININE  --    < > 1.22 1.28* 1.20 1.22 1.18  CALCIUM  --    < > 8.5* 8.4* 8.2* 8.2* 8.1*  MG 2.2  --   --   --   --   --   --    < > = values in this interval not displayed.   GFR: Estimated Creatinine Clearance: 63.7 mL/min (by C-G formula based on SCr of 1.18 mg/dL). Liver Function Tests: Recent Labs  Lab 06/28/19 0753  AST 37  ALT 38  ALKPHOS 80  BILITOT 1.1  PROT 5.3*  ALBUMIN 2.2*   No results for input(s): LIPASE, AMYLASE in the last 168 hours. No results for input(s): AMMONIA in the last 168 hours. Coagulation Profile: No results for input(s): INR, PROTIME in the last 168 hours. Cardiac Enzymes: No results for input(s): CKTOTAL, CKMB, CKMBINDEX, TROPONINI in the last 168 hours. BNP (last 3 results) No results for input(s): PROBNP in the last 8760 hours. HbA1C: No results for input(s): HGBA1C in the last 72 hours. CBG: Recent Labs  Lab 06/29/19 1119 06/29/19 1529 06/29/19 1932 06/29/19 2319 06/30/19 0426  GLUCAP 88 105* 108* 93 109*   Lipid Profile: No results for input(s): CHOL, HDL, LDLCALC, TRIG, CHOLHDL, LDLDIRECT in the last 72  hours. Thyroid Function Tests: No results for input(s): TSH, T4TOTAL, FREET4, T3FREE, THYROIDAB in the last  72 hours. Anemia Panel: No results for input(s): VITAMINB12, FOLATE, FERRITIN, TIBC, IRON, RETICCTPCT in the last 72 hours. Sepsis Labs: No results for input(s): PROCALCITON, LATICACIDVEN in the last 168 hours.  No results found for this or any previous visit (from the past 240 hour(s)).    Radiology Studies: DG Abd Portable 1V  Result Date: 06/29/2019 CLINICAL DATA:  Stroke.  Peg tube placement needed. EXAM: PORTABLE ABDOMEN - 1 VIEW COMPARISON:  06/21/2019. FINDINGS: Feeding tube noted with its tip over the proximal small bowel. No gastric or bowel distention. Contrast in the colon no free air. No acute bony abnormality. IMPRESSION: Feeding tube noted with its tip over the proximal small bowel. No gastric or bowel distention. Electronically Signed   By: Marcello Moores  Register   On: 06/29/2019 16:19   DG Swallowing Func-Speech Pathology  Result Date: 06/28/2019 Objective Swallowing Evaluation: Type of Study: MBS-Modified Barium Swallow Study  Patient Details Name: Qadir Kapple MRN: [id number] Date of Birth: [birth year] Today's Date: 06/28/2019 Time: SLP Start Time (ACUTE ONLY): 1240 -SLP Stop Time (ACUTE ONLY): 1254 SLP Time Calculation (min) (ACUTE ONLY): 14 min Past Medical History: Past Medical History: Diagnosis Date . 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 HPI:  74 year old Caucasian male, obese, with no other documented past medical history. Patient is not able to provide and history.  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  Subjective: Pt was lethargic Assessment / Plan / Recommendation CHL IP CLINICAL IMPRESSIONS  06/28/2019 Clinical Impression Pt was seen for a Modified Barium Swallow Study and he presents with moderate oropharyngeal dysphagia with resultant silent aspiration of thin liquid and deep laryngeal penetration with nectar-thick liquid on today's examination.  Laryngeal penetration and aspiration were secondary to premature spillage to the laryngeal vestibule/pyriform sinuses with a delayed swallow initiation and delayed laryngeal closure.  No laryngeal penetration or aspiration was observed with honey-thick liquid or pureed solids.  Pt was observed to be lethargic during this evaluation and he required verbal encouragement and extra time for AP transport and swallow initiation.  He was unable to trigger a swallow with tsps of thin liquid or honey-thick liquid and he instead pocketed the boluses in his anterior sulci. Oral phase was remarkable for reduced lingual control resulting in premature spillage to the larynx/pharynx, reduced lingual coordination resulting in lingual pumping, and reduced lingual strength resulting in trace-moderate oral residue.  Intermittent piecemeal deglutition was also observed.  Pharyngeal phase was remarkable for reduced BOT retraction and reduced hyolaryngeal excursion resulting in vallecular residue.   Of note, cortrak was in place for this evaluation but it did not interfere with epiglottic inversion.  Recommend initiation of Dysphagia 1 (puree) solids and honey-thick liquids with the following compensatory strategies: 1) Awake/alert 2) Small bites/sips 3) Sit upright as possible 4) Intermittent dry swallows 5) Check for oral clearance.  Despite initiation of a PO diet, there are still concerns that the patient may not meet his nutritional needs via PO intake alone secondary to lethargy.  Spoke with MD regarding results and recommendations who additionally relayed them to palliative care NP.   SLP Visit Diagnosis Dysphagia, oropharyngeal phase (R13.12) Attention and concentration  deficit following -- Frontal lobe and executive function deficit following -- Impact on safety and function Moderate aspiration  risk   CHL IP TREATMENT RECOMMENDATION 06/28/2019 Treatment Recommendations Therapy as outlined in treatment plan below   Prognosis 06/28/2019 Prognosis for Safe Diet Advancement Fair Barriers to Reach Goals Severity of deficits;Time post onset Barriers/Prognosis Comment -- CHL IP DIET RECOMMENDATION 06/28/2019 SLP Diet Recommendations Dysphagia 1 (Puree) solids;Honey thick liquids;Alternative means - temporary Liquid Administration via Cup;Straw Medication Administration Crushed with puree Compensations Slow rate;Small sips/bites;Effortful swallow Postural Changes Seated upright at 90 degrees   CHL IP OTHER RECOMMENDATIONS 06/28/2019 Recommended Consults -- Oral Care Recommendations Oral care BID;Staff/trained caregiver to provide oral care Other Recommendations Order thickener from pharmacy;Remove water pitcher   CHL IP FOLLOW UP RECOMMENDATIONS 06/28/2019 Follow up Recommendations Inpatient Rehab   CHL IP FREQUENCY AND DURATION 06/28/2019 Speech Therapy Frequency (ACUTE ONLY) min 2x/week Treatment Duration 2 weeks      CHL IP ORAL PHASE 06/28/2019 Oral Phase Impaired Oral - Pudding Teaspoon -- Oral - Pudding Cup -- Oral - Honey Teaspoon Holding of bolus;Lingual pumping;Pocketing in anterior sulcus Oral - Honey Cup Weak lingual manipulation;Reduced posterior propulsion;Lingual/palatal residue;Premature spillage Oral - Nectar Teaspoon Weak lingual manipulation;Delayed oral transit;Premature spillage;Lingual/palatal residue Oral - Nectar Cup -- Oral - Nectar Straw Delayed oral transit;Weak lingual manipulation;Lingual/palatal residue Oral - Thin Teaspoon Holding of bolus;Pocketing in anterior sulcus Oral - Thin Cup -- Oral - Thin Straw Piecemeal swallowing;Decreased bolus cohesion;Delayed oral transit;Lingual/palatal residue;Weak lingual manipulation Oral - Puree Weak lingual  manipulation;Lingual/palatal residue;Delayed oral transit;Premature spillage Oral - Mech Soft -- Oral - Regular -- Oral - Multi-Consistency -- Oral - Pill -- Oral Phase - Comment --  CHL IP PHARYNGEAL PHASE 06/28/2019 Pharyngeal Phase Impaired Pharyngeal- Pudding Teaspoon -- Pharyngeal -- Pharyngeal- Pudding Cup -- Pharyngeal -- Pharyngeal- Honey Teaspoon -- Pharyngeal -- Pharyngeal- Honey Cup Delayed swallow initiation-vallecula;Reduced tongue base retraction;Reduced anterior laryngeal mobility;Pharyngeal residue - valleculae Pharyngeal Material does not enter airway Pharyngeal- Nectar Teaspoon Delayed swallow initiation-pyriform sinuses;Penetration/Aspiration before swallow;Penetration/Aspiration during swallow;Trace aspiration;Reduced airway/laryngeal closure;Reduced tongue base retraction;Reduced anterior laryngeal mobility Pharyngeal Material enters airway, passes BELOW cords without attempt by patient to eject out (silent aspiration) Pharyngeal- Nectar Cup -- Pharyngeal -- Pharyngeal- Nectar Straw Delayed swallow initiation-pyriform sinuses;Reduced anterior laryngeal mobility;Reduced tongue base retraction Pharyngeal Material does not enter airway Pharyngeal- Thin Teaspoon -- Pharyngeal -- Pharyngeal- Thin Cup -- Pharyngeal -- Pharyngeal- Thin Straw Delayed swallow initiation-pyriform sinuses;Penetration/Aspiration before swallow;Moderate aspiration;Reduced anterior laryngeal mobility;Reduced airway/laryngeal closure;Reduced tongue base retraction;Pharyngeal residue - valleculae Pharyngeal Material enters airway, passes BELOW cords without attempt by patient to eject out (silent aspiration) Pharyngeal- Puree Delayed swallow initiation-vallecula;Pharyngeal residue - valleculae;Reduced anterior laryngeal mobility;Reduced tongue base retraction Pharyngeal Material does not enter airway Pharyngeal- Mechanical Soft -- Pharyngeal -- Pharyngeal- Regular -- Pharyngeal -- Pharyngeal- Multi-consistency -- Pharyngeal --  Pharyngeal- Pill -- Pharyngeal -- Pharyngeal Comment --  CHL IP CERVICAL ESOPHAGEAL PHASE 06/21/2019 Cervical Esophageal Phase WFL Pudding Teaspoon -- Pudding Cup -- Honey Teaspoon -- Honey Cup -- Nectar Teaspoon -- Nectar Cup -- Nectar Straw -- Thin Teaspoon -- Thin Cup -- Thin Straw -- Puree -- Mechanical Soft -- Regular -- Multi-consistency -- Pill -- Cervical Esophageal Comment -- Colin Mulders M.S., CCC-SLP Acute Rehabilitation Services Office: [phone] [place] 06/28/2019, 2:24 PM                Scheduled Meds: .  stroke: mapping our early stages of recovery book   Does not apply Once  . aspirin  324 mg Oral Daily  . atorvastatin  80 mg Oral q1800  . carvedilol  10 mg  Oral Daily  . clopidogrel  75 mg Per Tube Daily  . fluconazole  100 mg Oral Daily  . heparin  5,000 Units Subcutaneous Q8H   Continuous Infusions: . cefTRIAXone (ROCEPHIN)  IV Stopped (06/29/19 1859)  . dextrose 40 mL/hr at 06/30/19 0202     LOS: 12 days    Time spent: 25 minutes spent in the coordination of care today.    Jonnie Finner, DO [facility]  If 7PM-7AM, please contact night-coverage [email or web address] 06/30/2019, 7:23 AM

## 2019-07-01 ENCOUNTER — Inpatient Hospital Stay (HOSPITAL_COMMUNITY): Payer: Medicare Other

## 2019-07-01 LAB — RENAL FUNCTION PANEL
Albumin: 2.2 g/dL — ABNORMAL LOW (ref 3.5–5.0)
Anion gap: 12 (ref 5–15)
BUN: 17 mg/dL (ref 8–23)
CO2: 23 mmol/L (ref 22–32)
Calcium: 8.3 mg/dL — ABNORMAL LOW (ref 8.9–10.3)
Chloride: 105 mmol/L (ref 98–111)
Creatinine, Ser: 1.16 mg/dL (ref 0.61–1.24)
GFR calc Af Amer: >60 mL/min (ref >60)
GFR calc non Af Amer: >60 mL/min (ref >60)
Glucose, Bld: 94 mg/dL (ref 70–99)
Phosphorus: 3.6 mg/dL (ref 2.5–4.6)
Potassium: 3.8 mmol/L (ref 3.5–5.1)
Sodium: 140 mmol/L (ref 135–145)

## 2019-07-01 LAB — CBC WITH DIFFERENTIAL/PLATELET
Abs Immature Granulocytes: 0.22 10*3/uL — ABNORMAL HIGH (ref 0.00–0.07)
Basophils Absolute: 0.1 10*3/uL (ref 0.0–0.1)
Basophils Relative: 1 %
Eosinophils Absolute: 0.2 10*3/uL (ref 0.0–0.5)
Eosinophils Relative: 1 %
HCT: 42.1 % (ref 39.0–52.0)
Hemoglobin: 13.6 g/dL (ref 13.0–17.0)
Immature Granulocytes: 2 %
Lymphocytes Relative: 8 %
Lymphs Abs: 1.2 10*3/uL (ref 0.7–4.0)
MCH: 31.4 pg (ref 26.0–34.0)
MCHC: 32.3 g/dL (ref 30.0–36.0)
MCV: 97.2 fL (ref 80.0–100.0)
Monocytes Absolute: 1.5 10*3/uL — ABNORMAL HIGH (ref 0.1–1.0)
Monocytes Relative: 10 %
Neutro Abs: 11.7 10*3/uL — ABNORMAL HIGH (ref 1.7–7.7)
Neutrophils Relative %: 78 %
Platelets: 262 10*3/uL (ref 150–400)
RBC: 4.33 MIL/uL (ref 4.22–5.81)
RDW: 13.2 % (ref 11.5–15.5)
WBC: 14.9 10*3/uL — ABNORMAL HIGH (ref 4.0–10.5)
nRBC: 0 % (ref 0.0–0.2)

## 2019-07-01 LAB — MAGNESIUM: Magnesium: 1.9 mg/dL (ref 1.7–2.4)

## 2019-07-01 LAB — GLUCOSE, CAPILLARY
Glucose-Capillary: 82 mg/dL (ref 70–99)
Glucose-Capillary: 85 mg/dL (ref 70–99)
Glucose-Capillary: 87 mg/dL (ref 70–99)
Glucose-Capillary: 90 mg/dL (ref 70–99)
Glucose-Capillary: 91 mg/dL (ref 70–99)
Glucose-Capillary: 98 mg/dL (ref 70–99)

## 2019-07-01 MED ORDER — CARVEDILOL 3.125 MG PO TABS
3.1250 mg | ORAL_TABLET | Freq: Two times a day (BID) | ORAL | Status: DC
  Administered 2019-07-01 – 2019-07-02 (×3): 3.125 mg via ORAL
  Filled 2019-07-01 (×3): qty 1

## 2019-07-01 NOTE — Consult Note (Signed)
Chief Complaint: Patient was seen in consultation today for percutaneous gastrostomy placement.  Referring Physician(s): Cherylann Ratel, MD  Supervising Physician: Corrie Mckusick  Patient Status: [facility] - In-pt  History of Present Illness: Levi Pruitt is a 74 y.o. 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 Patient nonverbal on exam - no family at bedside. Patient does not follow commands for me, he coughs several times while touching his NG. Liquid stool noted on bedding.   Spoke with Bonita Quin (significant other/HCPOA) via phone today and she confirms they would like to proceed with gastrostomy placement. She is hopeful that since we will have to wait until the 26th to place this tube his PO intake will improve to the point we could cancel the procedure. She states that she has been well informed by the primary team regarding the risks, benefits and alternatives and denies any questions at this time. She was encouraged to discuss with primary team or IR if she has any questions or concerns in the mean time.   Past Medical History:  Diagnosis Date  . 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 No pertinent surgical history.  Allergies: Patient has no known allergies.  Medications: Prior to Admission medications   Medication Sig Start Date End Date Taking? Authorizing Provider  Cholecalciferol ([employer] VITAMIN D3) 250 MCG (10000 UT) CAPS Take 1 capsule by mouth daily.   Yes [provider]  vitamin C (ASCORBIC ACID) 250 MG tablet Take 250 mg by mouth daily.   Yes [provider]     History reviewed. No pertinent family history.  Social History   Socioeconomic History  . Marital  status: Widowed    Spouse name: Not on file  . Number of children: Not on file  . Years of education: Not on file  . Highest education level: Not on file  Occupational History  . Occupation: Lawer, semi-retired.  Tobacco Use  . Smoking status: Not on file  Substance and Sexual Activity  . Alcohol use: Not on file  . Drug use: Not on file  . Sexual activity: Not on file  Other Topics Concern  . Not on file  Social History Narrative   Staying home most of the time, does not exercise.    Social Determinants of Health   Financial Resource Strain:   . Difficulty of Paying Living Expenses: Not on file  Food Insecurity:   . Worried About Charity fundraiser in the Last Year: Not on file  . Ran Out of Food in the Last Year: Not on file  Transportation Needs:   . Lack of Transportation (Medical): Not on file  . Lack of Transportation (Non-Medical): Not on file  Physical Activity:   . Days of Exercise per Week: Not on file  . Minutes of Exercise per Session: Not on file  Stress:   . Feeling of Stress : Not on file  Social Connections:   . Frequency of Communication with Friends and Family: Not on file  . Frequency of Social Gatherings with Friends and Family: Not on file  . Attends Religious Services: Not on file  . Active Member of Clubs or Organizations: Not on file  . Attends Archivist Meetings: Not  on file  . Marital Status: Not on file     Review of Systems: A 12 point ROS discussed and pertinent positives are indicated in the HPI above.  All other systems are negative.  Review of Systems  Unable to perform ROS: Patient nonverbal    Vital Signs: BP 133/70 (BP Location: Right Arm)   Pulse 76   Temp 97.8 F (36.6 C) (Oral)   Resp 20   Ht 5\' 9"  (1.753 m)   Wt 211 lb 6.7 oz (95.9 kg)   SpO2 90%   BMI 31.22 kg/m   Physical Exam Vitals and nursing note reviewed.  Constitutional:      General: He is not in acute distress.    Appearance: He is  ill-appearing.  HENT:     Mouth/Throat:     Mouth: Mucous membranes are moist.     Pharynx: Oropharynx is clear. No oropharyngeal exudate or posterior oropharyngeal erythema.     Comments: Does not follow commands - difficult to assess airway on exam Cardiovascular:     Rate and Rhythm: Normal rate and regular rhythm.  Pulmonary:     Effort: Pulmonary effort is normal.     Comments: (+) congestion (+) NG  Abdominal:     General: There is no distension.  Skin:    General: Skin is warm and dry.     Findings: No lesion or rash.  Neurological:     Mental Status: He is alert. Mental status is at baseline. He is disoriented.      MD Evaluation Airway: WNL Heart: WNL Abdomen: WNL Chest/ Lungs: WNL ASA  Classification: 3 Mallampati/Airway Score: Two(Difficult to assess - patient does not always follow commands)   Imaging: CT ABDOMEN WO CONTRAST  Result Date: 07/01/2019 CLINICAL DATA:  Previous stroke. Preop planning for gastrostomy placement. EXAM: CT ABDOMEN WITHOUT CONTRAST TECHNIQUE: Multidetector CT imaging of the abdomen was performed following the standard protocol without IV contrast. COMPARISON:  None. FINDINGS: Lower chest: Small pleural effusions right greater than left. Patchy airspace opacity centrally in the visualized lung bases. No pericardial effusion. Hepatobiliary: No focal liver abnormality is seen. No gallstones, gallbladder wall thickening, or biliary dilatation. Pancreas: Unremarkable. No pancreatic ductal dilatation or surrounding inflammatory changes. Spleen: Normal in size without focal abnormality. Adrenals/Urinary Tract: Unremarkable adrenal glands. No hydronephrosis. 2.6 cm low-attenuation mid left renal lesion possibly cyst but incompletely characterized. Stomach/Bowel: Feeding tube through the decompressed stomach into the proximal jejunum. There is a small window for percutaneous gastrostomy placement, which may be improved with gastric distension; recommend  concurrent colonic opacification. The small bowel is decompressed. retained oral contrast material in the visualized colon and appendix, which are otherwise unremarkable. Vascular/Lymphatic: Aortoiliac atherosclerosis (ICD10-170.0). No abdominal or mesenteric adenopathy. Other: No ascites. No free air. Musculoskeletal: Multilevel lumbar spondylitic change. Bilateral sacroiliitis. No fracture or worrisome bone lesion. IMPRESSION: 1. There is a small window for percutaneous gastrostomy placement, which may be improved with gastric distension; recommend concurrent colonic opacification. 2. Small bilateral pleural effusions right greater than left. Electronically Signed   By: Lucrezia Europe M.D.   On: 07/01/2019 07:47   CT Code Stroke CTA Head W/WO contrast  Result Date: 06/18/2019 CLINICAL DATA:  74 year old male code stroke presentation with right side weakness. EXAM: CT ANGIOGRAPHY HEAD AND NECK CT PERFUSION BRAIN TECHNIQUE: Multidetector CT imaging of the head and neck was performed using the standard protocol during bolus administration of intravenous contrast. Multiplanar CT image reconstructions and MIPs were obtained to evaluate the  vascular anatomy. Carotid stenosis measurements (when applicable) are obtained utilizing NASCET criteria, using the distal internal carotid diameter as the denominator. Multiphase CT imaging of the brain was performed following IV bolus contrast injection. Subsequent parametric perfusion maps were calculated using RAPID software. CONTRAST:  100 milliliters Omnipaque 350 COMPARISON:  Plain head CT 1448 hours today. FINDINGS: CT Brain Perfusion Findings: ASPECTS: 10, although a large left PCA territory infarct was suspected by plain CT. CBF (<30%) Volume: 53mL, corresponding to a portion of the left PCA territory in the occipital lobe Perfusion (Tmax>6.0s) volume: 30mL, although some of this is likely artifactual involving the right temporal lobe portion of which is chronically abnormal.  Mismatch Volume: 33mL Infarction Location:Left PCA CTA NECK Skeleton: Carious dentition. No acute osseous abnormality identified. Upper chest: Small bilateral layering pleural effusions. Septal thickening and mild additional peribronchial opacity near the hila in the upper lobes. No superior mediastinal lymphadenopathy. Other neck: Retropharyngeal course of both carotid arteries. Small round 8 millimeter intermediate density soft tissue nodule in the superficial lobe of the left parotid gland. No cervical lymphadenopathy or other neck mass. Aortic arch: Mild Calcified aortic atherosclerosis. 3 vessel arch configuration. Right carotid system: Mild motion artifact at the right CCA origin. Circumferential soft plaque in the right CCA proximal to the bifurcation as it has a retropharyngeal course on series 3, image 125, but less than 50 % stenosis with respect to the distal vessel. Superimposed calcified plaque at the right carotid bifurcation, and bulky additional soft plaque in the right ICA bulb resulting in a short segment high-grade stenosis approaching a radiographic string sign on series 3, image 94. The right ICA remains patent to the skull base. Left carotid system: Normal left CCA origin. Circumferential soft plaque in the left CCA similar to that on the right without stenosis. Soft and calcified plaque at the left ICA origin and bulb resulting in up to 60 % stenosis with respect to the distal vessel. Left ICA remains patent to the skull base. Vertebral arteries: Mild if any proximal right subclavian artery plaque with no stenosis there or at the right vertebral artery origin. The right vertebral appears dominant and is patent to the skull base without stenosis. Soft and calcified plaque in the proximal left subclavian artery not resulting in significant subclavian artery stenosis, but there is moderate to severe stenosis at the left vertebral artery origin and V1 segment due to mostly soft plaque (series 6,  image 154). The distal left V1 segment is also diminutive and stenotic (image 151). The left V2 segment is intermittently diminutive and stenotic, but the vessel remains patent to the skull base. CTA HEAD Posterior circulation: The right vertebral artery is patent to the vertebrobasilar junction without stenosis. The right PICA origin remains patent. The left V4 segment is occluded distal to the patent left PICA but proximal to the vertebrobasilar junction on series 5, image 132. The basilar artery remains patent to the SCA origins. The left PCA is occluded just beyond its origin (series 9, image 26). No reconstitution. And the right PCA is functionally occluded at its origin with only minimal irregular right PCA branch enhancement. Anterior circulation: Both ICA siphons are patent. On the left there is moderate to severe circumferential calcified plaque in the cavernous segment resulting in moderate to severe stenosis proximal to the anterior genu (series 5, image 102). Moderate additional left supraclinoid segment stenosis. On the right there is similar advanced siphon atherosclerosis with moderate right cavernous and moderate to severe right supraclinoid  segment stenosis. Patent right ICA terminus. There is a patent small left posterior communicating artery. Patent carotid termini. Patent MCA and ACA origins. Left A1 is mildly dominant. Anterior communicating artery is patent with a median artery of the corpus callosum. Bilateral ACA branches are patent with mild irregularity. Left MCA M1 segment and left MCA bifurcation are patent without stenosis. Left MCA M3 branches are mildly to moderately irregular. Right MCA M1 segment and bifurcation are patent without stenosis. Right MCA M3 branches are also mildly to moderately irregular. Venous sinuses: Early contrast timing, grossly patent assuming dominant right side transverse and sigmoid sinuses. Anatomic variants: Median artery of the corpus callosum. Review of  the MIP images confirms the above findings IMPRESSION: 1. Positive for acute appearing occlusions of the distal Left Vertebral Artery and Left PCA: - distal left V4 occlusion, with upstream multifocal moderate and severe left vertebral stenosis in the neck. - left PCA P1 occlusion just beyond the origin. 2. CTP detects only some of the left PCA territory core infarct which was visible by plain CT. 3. 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 The right vertebral artery and basilar arteries are patent without stenosis. 4. 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 5. Mild to moderate irregularity of the bilateral MCA branches. 6. Small bilateral pleural effusions with increased pulmonary septal thickening suggestive of pulmonary edema. 7. 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 Recommend follow-up with ENT. Preliminary results of the arterial findings were text paged to Dr. Lorraine Lax via Shea Evans at 1503 hours, and again at 1518 hours. Electronically Signed   By: Genevie Ann M.D.   On: 06/18/2019 15:26   am PNA cxr  Result Date: 06/25/2019 CLINICAL DATA:  Pneumonia EXAM: CHEST  1 VIEW COMPARISON:  06/24/2019 FINDINGS: Enteric tube courses below the diaphragm, beyond the inferior margin of the film. Stable cardiomegaly. Continued worsening of diffuse airspace opacities throughout both lungs, most confluent within the right upper lobe. No pleural effusion or pneumothorax. IMPRESSION: Continued worsening of diffuse airspace opacities throughout both lungs, most confluent within the right upper lobe. Electronically Signed   By: Davina Poke D.O.   On: 06/25/2019 09:48   DG Abd 1 View  Result Date: 06/21/2019 CLINICAL DATA:  74 year old male with dysphagia. EXAM: ABDOMEN - 1 VIEW COMPARISON:  None. FINDINGS: Single view fluoroscopic  image of the upper abdomen provided. An enteric tube is noted with tip in the distal duodenum. Contrast injected via the tube opacifies distal duodenum. IMPRESSION: Enteric tube with tip in the distal duodenum. Electronically Signed   By: Anner Crete M.D.   On: 06/21/2019 16:55   CT Code Stroke CTA Neck W/WO contrast  Result Date: 06/18/2019 CLINICAL DATA:  74 year old male code stroke presentation with right side weakness. EXAM: CT ANGIOGRAPHY HEAD AND NECK CT PERFUSION BRAIN TECHNIQUE: Multidetector CT imaging of the head and neck was performed using the standard protocol during bolus administration of intravenous contrast. Multiplanar CT image reconstructions and MIPs were obtained to evaluate the vascular anatomy. Carotid stenosis measurements (when applicable) are obtained utilizing NASCET criteria, using the distal internal carotid diameter as the denominator. Multiphase CT imaging of the brain was performed following IV bolus contrast injection. Subsequent parametric perfusion maps were calculated using RAPID software. CONTRAST:  100 milliliters Omnipaque 350 COMPARISON:  Plain head CT 1448 hours today.  FINDINGS: CT Brain Perfusion Findings: ASPECTS: 10, although a large left PCA territory infarct was suspected by plain CT. CBF (<30%) Volume: 21mL, corresponding to a portion of the left PCA territory in the occipital lobe Perfusion (Tmax>6.0s) volume: 73mL, although some of this is likely artifactual involving the right temporal lobe portion of which is chronically abnormal. Mismatch Volume: 46mL Infarction Location:Left PCA CTA NECK Skeleton: Carious dentition. No acute osseous abnormality identified. Upper chest: Small bilateral layering pleural effusions. Septal thickening and mild additional peribronchial opacity near the hila in the upper lobes. No superior mediastinal lymphadenopathy. Other neck: Retropharyngeal course of both carotid arteries. Small round 8 millimeter intermediate density soft  tissue nodule in the superficial lobe of the left parotid gland. No cervical lymphadenopathy or other neck mass. Aortic arch: Mild Calcified aortic atherosclerosis. 3 vessel arch configuration. Right carotid system: Mild motion artifact at the right CCA origin. Circumferential soft plaque in the right CCA proximal to the bifurcation as it has a retropharyngeal course on series 3, image 125, but less than 50 % stenosis with respect to the distal vessel. Superimposed calcified plaque at the right carotid bifurcation, and bulky additional soft plaque in the right ICA bulb resulting in a short segment high-grade stenosis approaching a radiographic string sign on series 3, image 94. The right ICA remains patent to the skull base. Left carotid system: Normal left CCA origin. Circumferential soft plaque in the left CCA similar to that on the right without stenosis. Soft and calcified plaque at the left ICA origin and bulb resulting in up to 60 % stenosis with respect to the distal vessel. Left ICA remains patent to the skull base. Vertebral arteries: Mild if any proximal right subclavian artery plaque with no stenosis there or at the right vertebral artery origin. The right vertebral appears dominant and is patent to the skull base without stenosis. Soft and calcified plaque in the proximal left subclavian artery not resulting in significant subclavian artery stenosis, but there is moderate to severe stenosis at the left vertebral artery origin and V1 segment due to mostly soft plaque (series 6, image 154). The distal left V1 segment is also diminutive and stenotic (image 151). The left V2 segment is intermittently diminutive and stenotic, but the vessel remains patent to the skull base. CTA HEAD Posterior circulation: The right vertebral artery is patent to the vertebrobasilar junction without stenosis. The right PICA origin remains patent. The left V4 segment is occluded distal to the patent left PICA but proximal to the  vertebrobasilar junction on series 5, image 132. The basilar artery remains patent to the SCA origins. The left PCA is occluded just beyond its origin (series 9, image 26). No reconstitution. And the right PCA is functionally occluded at its origin with only minimal irregular right PCA branch enhancement. Anterior circulation: Both ICA siphons are patent. On the left there is moderate to severe circumferential calcified plaque in the cavernous segment resulting in moderate to severe stenosis proximal to the anterior genu (series 5, image 102). Moderate additional left supraclinoid segment stenosis. On the right there is similar advanced siphon atherosclerosis with moderate right cavernous and moderate to severe right supraclinoid segment stenosis. Patent right ICA terminus. There is a patent small left posterior communicating artery. Patent carotid termini. Patent MCA and ACA origins. Left A1 is mildly dominant. Anterior communicating artery is patent with a median artery of the corpus callosum. Bilateral ACA branches are patent with mild irregularity. Left MCA M1 segment and left MCA bifurcation  are patent without stenosis. Left MCA M3 branches are mildly to moderately irregular. Right MCA M1 segment and bifurcation are patent without stenosis. Right MCA M3 branches are also mildly to moderately irregular. Venous sinuses: Early contrast timing, grossly patent assuming dominant right side transverse and sigmoid sinuses. Anatomic variants: Median artery of the corpus callosum. Review of the MIP images confirms the above findings IMPRESSION: 1. Positive for acute appearing occlusions of the distal Left Vertebral Artery and Left PCA: - distal left V4 occlusion, with upstream multifocal moderate and severe left vertebral stenosis in the neck. - left PCA P1 occlusion just beyond the origin. 2. CTP detects only some of the left PCA territory core infarct which was visible by plain CT. 3. Chronic appearing occlusion of the  right PCA. The right vertebral artery and basilar arteries are patent without stenosis. 4. 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 5. Mild to moderate irregularity of the bilateral MCA branches. 6. Small bilateral pleural effusions with increased pulmonary septal thickening suggestive of pulmonary edema. 7. 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 Recommend follow-up with ENT. Preliminary results of the arterial findings were text paged to Dr. Lorraine Lax via Shea Evans at 1503 hours, and again at 1518 hours. Electronically Signed   By: Genevie Ann M.D.   On: 06/18/2019 15:26   MR BRAIN WO CONTRAST  Result Date: 06/19/2019 CLINICAL DATA:  Stroke follow-up. EXAM: MRI HEAD WITHOUT CONTRAST TECHNIQUE: Multiplanar, multiecho pulse sequences of the brain and surrounding structures were obtained without intravenous contrast. COMPARISON:  Head CT, CTA, and CTP 06/18/2019 FINDINGS: Brain: As seen on CT, there is a large acute left PCA infarct involving the medial aspects of the left temporal and occipital lobes. The left thalamus, posterior limb of the left internal capsule, left splenium of the corpus callosum, left mamillary body, and left cerebral peduncle are also involved. There is also a 7 mm acute cortical infarct in the posterior left frontal lobe which involves the anterior aspect of the precentral gyrus. There is no hemorrhage associated with these acute infarcts. Chronic infarcts are again noted in the right temporal and right occipital lobes. There is a single punctate focus of susceptibility artifact in the right cerebellum suggesting a chronic microhemorrhage. Small chronic infarcts are noted in the cerebellum bilaterally. Scattered small foci of T2 hyperintensity in the cerebral white matter bilaterally are nonspecific but compatible  with minimal chronic small vessel ischemic disease. There is no mass, midline shift, or extra-axial fluid collection. There is mild global cerebral atrophy. Vascular: Abnormal appearance of the distal left vertebral artery corresponding to occlusion on CT. Skull and upper cervical spine: No suspicious marrow lesion. Sinuses/Orbits: Unremarkable orbits. Paranasal sinuses and mastoid air cells are clear. Other: None. IMPRESSION: 1. Large acute left PCA infarct. 2. Small acute left frontal lobe infarct. 3. 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 Electronically Signed   By: Logan Bores M.D.   On: 06/19/2019 11:27   CT Code Stroke Cerebral Perfusion with contrast  Result Date: 06/18/2019 CLINICAL DATA:  74 year old male code stroke presentation with right side weakness. EXAM: CT ANGIOGRAPHY HEAD AND NECK CT PERFUSION BRAIN TECHNIQUE: Multidetector CT imaging of the head and neck was performed using the standard protocol during bolus administration of intravenous contrast. Multiplanar CT image reconstructions and MIPs were obtained to evaluate the vascular  anatomy. Carotid stenosis measurements (when applicable) are obtained utilizing NASCET criteria, using the distal internal carotid diameter as the denominator. Multiphase CT imaging of the brain was performed following IV bolus contrast injection. Subsequent parametric perfusion maps were calculated using RAPID software. CONTRAST:  100 milliliters Omnipaque 350 COMPARISON:  Plain head CT 1448 hours today. FINDINGS: CT Brain Perfusion Findings: ASPECTS: 10, although a large left PCA territory infarct was suspected by plain CT. CBF (<30%) Volume: 67mL, corresponding to a portion of the left PCA territory in the occipital lobe Perfusion (Tmax>6.0s) volume: 63mL, although some of this is likely artifactual involving the right temporal lobe portion of which is chronically abnormal. Mismatch Volume: 42mL Infarction Location:Left PCA CTA NECK Skeleton:  Carious dentition. No acute osseous abnormality identified. Upper chest: Small bilateral layering pleural effusions. Septal thickening and mild additional peribronchial opacity near the hila in the upper lobes. No superior mediastinal lymphadenopathy. Other neck: Retropharyngeal course of both carotid arteries. Small round 8 millimeter intermediate density soft tissue nodule in the superficial lobe of the left parotid gland. No cervical lymphadenopathy or other neck mass. Aortic arch: Mild Calcified aortic atherosclerosis. 3 vessel arch configuration. Right carotid system: Mild motion artifact at the right CCA origin. Circumferential soft plaque in the right CCA proximal to the bifurcation as it has a retropharyngeal course on series 3, image 125, but less than 50 % stenosis with respect to the distal vessel. Superimposed calcified plaque at the right carotid bifurcation, and bulky additional soft plaque in the right ICA bulb resulting in a short segment high-grade stenosis approaching a radiographic string sign on series 3, image 94. The right ICA remains patent to the skull base. Left carotid system: Normal left CCA origin. Circumferential soft plaque in the left CCA similar to that on the right without stenosis. Soft and calcified plaque at the left ICA origin and bulb resulting in up to 60 % stenosis with respect to the distal vessel. Left ICA remains patent to the skull base. Vertebral arteries: Mild if any proximal right subclavian artery plaque with no stenosis there or at the right vertebral artery origin. The right vertebral appears dominant and is patent to the skull base without stenosis. Soft and calcified plaque in the proximal left subclavian artery not resulting in significant subclavian artery stenosis, but there is moderate to severe stenosis at the left vertebral artery origin and V1 segment due to mostly soft plaque (series 6, image 154). The distal left V1 segment is also diminutive and stenotic  (image 151). The left V2 segment is intermittently diminutive and stenotic, but the vessel remains patent to the skull base. CTA HEAD Posterior circulation: The right vertebral artery is patent to the vertebrobasilar junction without stenosis. The right PICA origin remains patent. The left V4 segment is occluded distal to the patent left PICA but proximal to the vertebrobasilar junction on series 5, image 132. The basilar artery remains patent to the SCA origins. The left PCA is occluded just beyond its origin (series 9, image 26). No reconstitution. And the right PCA is functionally occluded at its origin with only minimal irregular right PCA branch enhancement. Anterior circulation: Both ICA siphons are patent. On the left there is moderate to severe circumferential calcified plaque in the cavernous segment resulting in moderate to severe stenosis proximal to the anterior genu (series 5, image 102). Moderate additional left supraclinoid segment stenosis. On the right there is similar advanced siphon atherosclerosis with moderate right cavernous and moderate to severe right supraclinoid segment  stenosis. Patent right ICA terminus. There is a patent small left posterior communicating artery. Patent carotid termini. Patent MCA and ACA origins. Left A1 is mildly dominant. Anterior communicating artery is patent with a median artery of the corpus callosum. Bilateral ACA branches are patent with mild irregularity. Left MCA M1 segment and left MCA bifurcation are patent without stenosis. Left MCA M3 branches are mildly to moderately irregular. Right MCA M1 segment and bifurcation are patent without stenosis. Right MCA M3 branches are also mildly to moderately irregular. Venous sinuses: Early contrast timing, grossly patent assuming dominant right side transverse and sigmoid sinuses. Anatomic variants: Median artery of the corpus callosum. Review of the MIP images confirms the above findings IMPRESSION: 1. Positive for  acute appearing occlusions of the distal Left Vertebral Artery and Left PCA: - distal left V4 occlusion, with upstream multifocal moderate and severe left vertebral stenosis in the neck. - left PCA P1 occlusion just beyond the origin. 2. CTP detects only some of the left PCA territory core infarct which was visible by plain CT. 3. 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 The right vertebral artery and basilar arteries are patent without stenosis. 4. 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 5. Mild to moderate irregularity of the bilateral MCA branches. 6. Small bilateral pleural effusions with increased pulmonary septal thickening suggestive of pulmonary edema. 7. 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 Recommend follow-up with ENT. Preliminary results of the arterial findings were text paged to Dr. Lorraine Lax via Shea Evans at 1503 hours, and again at 1518 hours. Electronically Signed   By: Genevie Ann M.D.   On: 06/18/2019 15:26   DG CHEST PORT 1 VIEW  Result Date: 06/24/2019 CLINICAL DATA:  Follow-up so the HF. EXAM: PORTABLE CHEST 1 VIEW COMPARISON:  06/23/2019 FINDINGS: Opacity has become more apparent in the upper lobes, right greater than left. There is more diffuse prominence of the bronchovascular markings, similar to the previous day's exam. No convincing pleural effusion.  No pneumothorax. Orogastric tube passes into the stomach below the included field of view. IMPRESSION: 1. Mild increase in upper lung zone airspace opacities, right greater than left, with persistent prominent bronchovascular markings. Findings may reflect mild worsening pulmonary edema with asymmetric upper lobe airspace edema, versus multifocal infection. Electronically Signed   By: Lajean Manes M.D.   On: 06/24/2019 11:14   DG CHEST PORT 1  VIEW  Result Date: 06/23/2019 CLINICAL DATA:  Congestive heart failure. EXAM: PORTABLE CHEST 1 VIEW COMPARISON:  June 22, 2019. FINDINGS: Stable cardiomegaly. Feeding tube is seen entering stomach. No pneumothorax or pleural effusion is noted. Minimal central pulmonary vascular congestion may be present. No significant consolidative process is noted. Bony thorax is unremarkable. IMPRESSION: 1. Stable cardiomegaly. Minimal central pulmonary vascular congestion may be present. No significant consolidative process is noted. 2. No evidence of pulmonary edema. Electronically Signed   By: Marijo Conception M.D.   On: 06/23/2019 07:46   DG CHEST PORT 1 VIEW  Result Date: 06/22/2019 CLINICAL DATA:  Shortness of breath. EXAM: PORTABLE CHEST 1 VIEW COMPARISON:  June 20, 2019 FINDINGS: Feeding tube tip is below the diaphragm. No pneumothorax. There is cardiomegaly with pulmonary vascular congestion. There is hazy alveolar opacity bilaterally in a predominantly perihilar distribution. There is also mild interstitial thickening. There is an equivocal left pleural  effusion. No adenopathy. No bone lesions. IMPRESSION: Cardiomegaly with pulmonary vascular congestion. Perihilar alveolar opacity, likely alveolar edema. There may be mild interstitial edema superimposed. The overall appearance is most indicative of congestive heart failure. A degree of atypical organism pneumonia superimposed cannot be excluded given the areas of perihilar airspace opacity. Feeding tube tip is below the diaphragm. Electronically Signed   By: Lowella Grip III M.D.   On: 06/22/2019 07:46   DG CHEST PORT 1 VIEW  Result Date: 06/20/2019 CLINICAL DATA:  Patient admitted with a stroke. EXAM: PORTABLE CHEST 1 VIEW COMPARISON:  June 19, 2019 FINDINGS: Persistent cardiomegaly and vascular congestion. The vascular congestion has worsened. No other interval changes. IMPRESSION: Cardiomegaly and worsening vascular congestion/mild edema.  Electronically Signed   By: Dorise Bullion III M.D   On: 06/20/2019 18:10   DG CHEST PORT 1 VIEW  Result Date: 06/19/2019 CLINICAL DATA:  Acute left PCA infarct EXAM: PORTABLE CHEST 1 VIEW COMPARISON:  06/18/2019 FINDINGS: Stable cardiomegaly with central vascular congestion. Left basilar airspace opacity/consolidation obscures the left hemidiaphragm compatible with atelectasis and or pneumonia. Right lung remains clear. No large effusion or pneumothorax. Trachea midline. Degenerative changes of the spine. IMPRESSION: Cardiomegaly with vascular congestion Left basilar atelectasis/pneumonia. Electronically Signed   By: Jerilynn Mages.  Shick M.D.   On: 06/19/2019 13:48   XR Chest Single View  Result Date: 06/18/2019 CLINICAL DATA:  Altered behavior EXAM: PORTABLE CHEST 1 VIEW COMPARISON:  None. FINDINGS: Mild cardiomegaly with central vascular congestion. Probable small pleural effusions. Patchy airspace disease left infrahilar lung. No pneumothorax. IMPRESSION: 1. Mild cardiomegaly with central vascular congestion and small pleural effusions. 2. Patchy airspace disease in the left infrahilar lung, atelectasis versus pneumonia. Electronically Signed   By: Donavan Foil M.D.   On: 06/18/2019 17:40   DG Abd Portable 1V  Result Date: 06/29/2019 CLINICAL DATA:  Stroke.  Peg tube placement needed. EXAM: PORTABLE ABDOMEN - 1 VIEW COMPARISON:  06/21/2019. FINDINGS: Feeding tube noted with its tip over the proximal small bowel. No gastric or bowel distention. Contrast in the colon no free air. No acute bony abnormality. IMPRESSION: Feeding tube noted with its tip over the proximal small bowel. No gastric or bowel distention. Electronically Signed   By: Marcello Moores  Register   On: 06/29/2019 16:19   DG Swallowing Func-Speech Pathology  Result Date: 06/28/2019 Objective Swallowing Evaluation: Type of Study: MBS-Modified Barium Swallow Study  Patient Details Name: Zadok Groman MRN: [id number] Date of Birth: 26-Sep-1945 Today's Date:  06/28/2019 Time: SLP Start Time (ACUTE ONLY): 1240 -SLP Stop Time (ACUTE ONLY): 1254 SLP Time Calculation (min) (ACUTE ONLY): 14 min Past Medical History: Past Medical History: Diagnosis Date . 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 HPI:  74 year old Caucasian male, obese, with no other documented past medical history. Patient is not able to provide and history.  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  Subjective: Pt was lethargic Assessment / Plan / Recommendation CHL IP CLINICAL IMPRESSIONS 06/28/2019 Clinical Impression Pt was seen for a Modified Barium Swallow Study and he presents with moderate oropharyngeal dysphagia with resultant silent aspiration of thin liquid and deep laryngeal penetration with nectar-thick liquid on today's examination.  Laryngeal penetration and aspiration were secondary to premature spillage to the laryngeal vestibule/pyriform sinuses  with a delayed swallow initiation and delayed laryngeal closure.  No laryngeal penetration or aspiration was observed with honey-thick liquid or pureed solids.  Pt was observed to be lethargic during this evaluation and he required verbal encouragement and extra time for AP transport and swallow initiation.  He was unable to trigger a swallow with tsps of thin liquid or honey-thick liquid and he instead pocketed the boluses in his anterior sulci. Oral phase was remarkable for reduced lingual control resulting in premature spillage to the larynx/pharynx, reduced lingual coordination resulting in lingual pumping, and reduced lingual strength resulting in trace-moderate oral residue.  Intermittent piecemeal deglutition was also observed.  Pharyngeal phase was remarkable for  reduced BOT retraction and reduced hyolaryngeal excursion resulting in vallecular residue.   Of note, cortrak was in place for this evaluation but it did not interfere with epiglottic inversion.  Recommend initiation of Dysphagia 1 (puree) solids and honey-thick liquids with the following compensatory strategies: 1) Awake/alert 2) Small bites/sips 3) Sit upright as possible 4) Intermittent dry swallows 5) Check for oral clearance.  Despite initiation of a PO diet, there are still concerns that the patient may not meet his nutritional needs via PO intake alone secondary to lethargy.  Spoke with MD regarding results and recommendations who additionally relayed them to palliative care NP.   SLP Visit Diagnosis Dysphagia, oropharyngeal phase (R13.12) Attention and concentration deficit following -- Frontal lobe and executive function deficit following -- Impact on safety and function Moderate aspiration risk   CHL IP TREATMENT RECOMMENDATION 06/28/2019 Treatment Recommendations Therapy as outlined in treatment plan below   Prognosis 06/28/2019 Prognosis for Safe Diet Advancement Fair Barriers to Reach Goals Severity of deficits;Time post onset Barriers/Prognosis Comment -- CHL IP DIET RECOMMENDATION 06/28/2019 SLP Diet Recommendations Dysphagia 1 (Puree) solids;Honey thick liquids;Alternative means - temporary Liquid Administration via Cup;Straw Medication Administration Crushed with puree Compensations Slow rate;Small sips/bites;Effortful swallow Postural Changes Seated upright at 90 degrees   CHL IP OTHER RECOMMENDATIONS 06/28/2019 Recommended Consults -- Oral Care Recommendations Oral care BID;Staff/trained caregiver to provide oral care Other Recommendations Order thickener from pharmacy;Remove water pitcher   CHL IP FOLLOW UP RECOMMENDATIONS 06/28/2019 Follow up Recommendations Inpatient Rehab   CHL IP FREQUENCY AND DURATION 06/28/2019 Speech Therapy Frequency (ACUTE ONLY) min 2x/week Treatment Duration 2 weeks      CHL  IP ORAL PHASE 06/28/2019 Oral Phase Impaired Oral - Pudding Teaspoon -- Oral - Pudding Cup -- Oral - Honey Teaspoon Holding of bolus;Lingual pumping;Pocketing in anterior sulcus Oral - Honey Cup Weak lingual manipulation;Reduced posterior propulsion;Lingual/palatal residue;Premature spillage Oral - Nectar Teaspoon Weak lingual manipulation;Delayed oral transit;Premature spillage;Lingual/palatal residue Oral - Nectar Cup -- Oral - Nectar Straw Delayed oral transit;Weak lingual manipulation;Lingual/palatal residue Oral - Thin Teaspoon Holding of bolus;Pocketing in anterior sulcus Oral - Thin Cup -- Oral - Thin Straw Piecemeal swallowing;Decreased bolus cohesion;Delayed oral transit;Lingual/palatal residue;Weak lingual manipulation Oral - Puree Weak lingual manipulation;Lingual/palatal residue;Delayed oral transit;Premature spillage Oral - Mech Soft -- Oral - Regular -- Oral - Multi-Consistency -- Oral - Pill -- Oral Phase - Comment --  CHL IP PHARYNGEAL PHASE 06/28/2019 Pharyngeal Phase Impaired Pharyngeal- Pudding Teaspoon -- Pharyngeal -- Pharyngeal- Pudding Cup -- Pharyngeal -- Pharyngeal- Honey Teaspoon -- Pharyngeal -- Pharyngeal- Honey Cup Delayed swallow initiation-vallecula;Reduced tongue base retraction;Reduced anterior laryngeal mobility;Pharyngeal residue - valleculae Pharyngeal Material does not enter airway Pharyngeal- Nectar Teaspoon Delayed swallow initiation-pyriform sinuses;Penetration/Aspiration before swallow;Penetration/Aspiration during swallow;Trace aspiration;Reduced airway/laryngeal closure;Reduced tongue base retraction;Reduced anterior laryngeal mobility Pharyngeal Material enters airway, passes BELOW  cords without attempt by patient to eject out (silent aspiration) Pharyngeal- Nectar Cup -- Pharyngeal -- Pharyngeal- Nectar Straw Delayed swallow initiation-pyriform sinuses;Reduced anterior laryngeal mobility;Reduced tongue base retraction Pharyngeal Material does not enter airway Pharyngeal-  Thin Teaspoon -- Pharyngeal -- Pharyngeal- Thin Cup -- Pharyngeal -- Pharyngeal- Thin Straw Delayed swallow initiation-pyriform sinuses;Penetration/Aspiration before swallow;Moderate aspiration;Reduced anterior laryngeal mobility;Reduced airway/laryngeal closure;Reduced tongue base retraction;Pharyngeal residue - valleculae Pharyngeal Material enters airway, passes BELOW cords without attempt by patient to eject out (silent aspiration) Pharyngeal- Puree Delayed swallow initiation-vallecula;Pharyngeal residue - valleculae;Reduced anterior laryngeal mobility;Reduced tongue base retraction Pharyngeal Material does not enter airway Pharyngeal- Mechanical Soft -- Pharyngeal -- Pharyngeal- Regular -- Pharyngeal -- Pharyngeal- Multi-consistency -- Pharyngeal -- Pharyngeal- Pill -- Pharyngeal -- Pharyngeal Comment --  CHL IP CERVICAL ESOPHAGEAL PHASE 06/21/2019 Cervical Esophageal Phase WFL Pudding Teaspoon -- Pudding Cup -- Honey Teaspoon -- Honey Cup -- Nectar Teaspoon -- Nectar Cup -- Nectar Straw -- Thin Teaspoon -- Thin Cup -- Thin Straw -- Puree -- Mechanical Soft -- Regular -- Multi-consistency -- Pill -- Cervical Esophageal Comment -- Colin Mulders M.S., CCC-SLP Acute Rehabilitation Services Office: [phone] [place] 06/28/2019, 2:24 PM              DG Swallowing Func-Speech Pathology  Result Date: 06/21/2019 Objective Swallowing Evaluation: Type of Study: MBS-Modified Barium Swallow Study  Patient Details Name: Quishawn Fuselier MRN: [id number] Date of Birth: Jan 16, 1946 Today's Date: 06/21/2019 Time: SLP Start Time (ACUTE ONLY): 1100 -SLP Stop Time (ACUTE ONLY): 1128 SLP Time Calculation (min) (ACUTE ONLY): 28 min Past Medical History: Past Medical History: Diagnosis Date . 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 HPI:  74 year old Caucasian male, obese, with no other documented past medical history. Patient is not able to provide  and history.  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  Subjective: lethargic, arousable with multimodal cues Assessment / Plan / Recommendation CHL IP CLINICAL IMPRESSIONS 06/21/2019 Clinical Impression MBSS reveals moderate oral dysphagia, mild to moderate pharyngeal dyshpagia suspected to be of multifactorial etiology; neurogenic and cognitive decline post CVA. Pt required maximal cueing for improving alertness throughout MBSS.  Oral deficits include right sided oral motor deficits including reduced lingual coordination, oral holding, intermittent right sided anterior spillage, right sided buccal pocketing, and delayed oral transit. Thin liquid by teaspoon bolus, extracted from oral cavity after prolonged oral holding despite cues. Oral suction utilized post MBSS to assist with removal of oral residuals.  Pharyngeal deficits c/b delay in swallow initiation, decreased timely epiglottic inversion, reduced base of tongue retraction and decreased laryngeal elevation. This allowed for intermittent pre swallow penetration of nectar thick liquids, intermittent during the swallow penetration of honey thick liquids by cup. Despite no aspiration visualized on MBSS this date, risk remains significantly increased due to fluctuation in patients mentation and deficits noted on exam. Recommend continue NPO with short term alternative nutrition with PO trials with SLP (nectar thick, honey thick, and puree POs) and hopes of diet advancement. SLP to follow up.   SLP Visit Diagnosis Dysphagia, oropharyngeal phase (R13.12) Attention and concentration deficit following -- Frontal lobe and executive function deficit following -- Impact on safety and function Moderate aspiration risk;Severe aspiration risk;Risk for inadequate  nutrition/hydration   CHL IP TREATMENT RECOMMENDATION 06/21/2019 Treatment  Recommendations Therapy as outlined in treatment plan below   Prognosis 06/21/2019 Prognosis for Safe Diet Advancement Fair Barriers to Reach Goals Time post onset Barriers/Prognosis Comment -- CHL IP DIET RECOMMENDATION 06/21/2019 SLP Diet Recommendations Alternative means - temporary Liquid Administration via -- Medication Administration Via alternative means Compensations -- Postural Changes --   CHL IP OTHER RECOMMENDATIONS 06/21/2019 Recommended Consults -- Oral Care Recommendations Oral care QID Other Recommendations --   CHL IP FOLLOW UP RECOMMENDATIONS 06/21/2019 Follow up Recommendations Inpatient Rehab;Skilled Nursing facility   [facility] IP FREQUENCY AND DURATION 06/21/2019 Speech Therapy Frequency (ACUTE ONLY) min 2x/week Treatment Duration 2 weeks      CHL IP ORAL PHASE 06/21/2019 Oral Phase Impaired Oral - Pudding Teaspoon -- Oral - Pudding Cup -- Oral - Honey Teaspoon -- Oral - Honey Cup Weak lingual manipulation;Right anterior bolus loss;Reduced posterior propulsion;Incomplete tongue to palate contact;Holding of bolus;Lingual/palatal residue;Right pocketing in lateral sulci;Delayed oral transit Oral - Nectar Teaspoon -- Oral - Nectar Cup Right anterior bolus loss;Weak lingual manipulation;Incomplete tongue to palate contact;Reduced posterior propulsion;Holding of bolus;Right pocketing in lateral sulci;Pocketing in anterior sulcus;Lingual/palatal residue;Delayed oral transit Oral - Nectar Straw -- Oral - Thin Teaspoon Holding of bolus;Right anterior bolus loss Oral - Thin Cup -- Oral - Thin Straw -- Oral - Puree Delayed oral transit;Lingual/palatal residue;Weak lingual manipulation;Incomplete tongue to palate contact;Reduced posterior propulsion;Decreased bolus cohesion Oral - Mech Soft -- Oral - Regular -- Oral - Multi-Consistency -- Oral - Pill -- Oral Phase - Comment --  CHL IP PHARYNGEAL PHASE 06/21/2019 Pharyngeal Phase Impaired  Pharyngeal- Pudding Teaspoon -- Pharyngeal -- Pharyngeal- Pudding Cup -- Pharyngeal -- Pharyngeal- Honey Teaspoon -- Pharyngeal -- Pharyngeal- Honey Cup Delayed swallow initiation-pyriform sinuses;Reduced anterior laryngeal mobility;Reduced airway/laryngeal closure;Reduced tongue base retraction;Penetration/Aspiration during swallow;Pharyngeal residue - valleculae;Pharyngeal residue - pyriform;Reduced epiglottic inversion Pharyngeal Material does not enter airway;Material enters airway, remains ABOVE vocal cords then ejected out Pharyngeal- Nectar Teaspoon -- Pharyngeal -- Pharyngeal- Nectar Cup Delayed swallow initiation-pyriform sinuses;Reduced epiglottic inversion;Reduced airway/laryngeal closure;Reduced tongue base retraction;Penetration/Aspiration before swallow Pharyngeal Material does not enter airway;Material enters airway, remains ABOVE vocal cords and not ejected out Pharyngeal- Nectar Straw -- Pharyngeal -- Pharyngeal- Thin Teaspoon Other (Comment) Pharyngeal -- Pharyngeal- Thin Cup -- Pharyngeal -- Pharyngeal- Thin Straw -- Pharyngeal -- Pharyngeal- Puree Delayed swallow initiation-vallecula;Reduced tongue base retraction;Pharyngeal residue - valleculae;Reduced anterior laryngeal mobility;Reduced laryngeal elevation;Pharyngeal residue - pyriform Pharyngeal -- Pharyngeal- Mechanical Soft -- Pharyngeal -- Pharyngeal- Regular -- Pharyngeal -- Pharyngeal- Multi-consistency -- Pharyngeal -- Pharyngeal- Pill -- Pharyngeal -- Pharyngeal Comment --  CHL IP CERVICAL ESOPHAGEAL PHASE 06/21/2019 Cervical Esophageal Phase WFL Pudding Teaspoon -- Pudding Cup -- Honey Teaspoon -- Honey Cup -- Nectar Teaspoon -- Nectar Cup -- Nectar Straw -- Thin Teaspoon -- Thin Cup -- Thin Straw -- Puree -- Mechanical Soft -- Regular -- Multi-consistency -- Pill -- Cervical Esophageal Comment -- Chelsea E Hartness MA, CCC-SLP Acute Rehabilitation Services 06/21/2019, 12:00 PM              ECHOCARDIOGRAM COMPLETE  Result Date:  06/25/2019   ECHOCARDIOGRAM REPORT   Patient Name:   Dewayne Sadowsky Date of Exam: 06/25/2019 Medical Rec #:  [id number]   Height:       69.0 in Accession #:    [id number]  Weight:       208.3 lb Date of Birth:  1945/06/19   BSA:          2.10 m Patient Age:    75 years    BP:  124/69 mmHg Patient Gender: M           HR:           69 bpm. Exam Location:  Inpatient Procedure: 2D Echo Indications:    stroke  History:        Patient has prior history of Echocardiogram examinations, most                 recent 06/19/2019. Stroke.  Sonographer:    Jannett Celestine RDCS (AE) Referring Phys: [id number] Rosalin Hawking  Sonographer Comments: Technically difficult study due to poor echo windows. Image acquisition challenging due to patient body habitus. extremely limited mobility (please see comments) IMPRESSIONS  1. Left ventricular ejection fraction, by visual estimation, is 30 to 35%. The left ventricle has moderate to severely decreased function. There is mildly increased left ventricular hypertrophy.  2. The left ventricle demonstrates regional wall motion abnormalities. Inferior/inferoseptal/apical akinesis  3. Moderately dilated left ventricular internal cavity size.  4. Left ventricular diastolic parameters are consistent with Grade III diastolic dysfunction (restrictive).  5. Elevated left atrial pressure.  6. Global right ventricle has normal systolic function.The right ventricular size is normal.  7. The mitral valve is normal in structure. Trivial mitral valve regurgitation.  8. The tricuspid valve is normal in structure.  9. The aortic valve is tricuspid. Aortic valve regurgitation is not visualized. No evidence of aortic valve sclerosis or stenosis. 10. The pulmonic valve was not well visualized. Pulmonic valve regurgitation is not visualized. 11. TR signal is inadequate for assessing pulmonary artery systolic pressure. 12. The inferior vena cava is normal in size with greater than 50% respiratory variability, suggesting  right atrial pressure of 3 mmHg. FINDINGS  Left Ventricle: Left ventricular ejection fraction, by visual estimation, is 30 to 35%. The left ventricle has moderate to severely decreased function. Definity contrast agent was given IV to delineate the left ventricular endocardial borders. The left ventricle demonstrates regional wall motion abnormalities. The left ventricular internal cavity size was moderately dilated left ventricle. There is mildly increased left ventricular hypertrophy. Left ventricular diastolic parameters are consistent with Grade III diastolic dysfunction (restrictive). Elevated left atrial pressure. Right Ventricle: The right ventricular size is normal. No increase in right ventricular wall thickness. Global RV systolic function is has normal systolic function. Left Atrium: Left atrial size was not well visualized. Right Atrium: Right atrial size was not well visualized Pericardium: There is no evidence of pericardial effusion. Mitral Valve: The mitral valve is normal in structure. Trivial mitral valve regurgitation. Tricuspid Valve: The tricuspid valve is normal in structure. Tricuspid valve regurgitation is not demonstrated. Aortic Valve: The aortic valve is tricuspid. Aortic valve regurgitation is not visualized. The aortic valve is structurally normal, with no evidence of sclerosis or stenosis. Pulmonic Valve: The pulmonic valve was not well visualized. Pulmonic valve regurgitation is not visualized. Pulmonic regurgitation is not visualized. Aorta: The aortic root is normal in size and structure. Venous: The inferior vena cava is normal in size with greater than 50% respiratory variability, suggesting right atrial pressure of 3 mmHg. IAS/Shunts: The interatrial septum was not well visualized.  LEFT VENTRICLE PLAX 2D LVIDd:         6.30 cm  Diastology LVIDs:         4.80 cm  LV e' lateral:   9.57 cm/s LV PW:         1.20 cm  LV E/e' lateral: 10.8 LV IVS:        1.10 cm  LV e' medial:    3.81  cm/s LVOT diam:     2.10 cm  LV E/e' medial:  27.0 LV SV:         94 ml LV SV Index:   43.13 LVOT Area:     3.46 cm  LEFT ATRIUM         Index LA diam:    4.20 cm 2.00 cm/m  AORTIC VALVE LVOT Vmax:   65.80 cm/s LVOT Vmean:  43.300 cm/s LVOT VTI:    0.136 m  AORTA Ao Root diam: 3.30 cm MITRAL VALVE MV Area (PHT): 4.39 cm              SHUNTS MV PHT:        50.17 msec            Systemic VTI:  0.14 m MV Decel Time: 173 msec              Systemic Diam: 2.10 cm MV E velocity: 103.00 cm/s 103 cm/s MV A velocity: 41.20 cm/s  70.3 cm/s MV E/A ratio:  2.50        1.5  Oswaldo Milian MD Electronically signed by Oswaldo Milian MD Signature Date/Time: 06/25/2019/4:18:14 PM    Final    ECHOCARDIOGRAM COMPLETE  Result Date: 06/19/2019   ECHOCARDIOGRAM REPORT   Patient Name:   Jeet Ohlinger Date of Exam: 06/19/2019 Medical Rec #:  [id number]   Height:       69.0 in Accession #:    [id number]  Weight:       214.5 lb Date of Birth:  [birth year]   BSA:          2.13 m Patient Age:    39 years    BP:           126/69 mmHg Patient Gender: M           HR:           69 bpm. Exam Location:  Inpatient Procedure: 2D Echo Indications:    STEMI I21.3  History:        Patient has no prior history of Echocardiogram examinations.                 Stroke. AKI (acute kidney injury)                 Abnormal ECG.  Sonographer:    Vikki Ports Turrentine Referring Phys: [place]  1. Left ventricular ejection fraction, by visual estimation, is 25 to 30%. The left ventricle has severely decreased function. There is no left ventricular hypertrophy.  2. Elevated left atrial pressure.  3. Left ventricular diastolic parameters are consistent with Grade III diastolic dysfunction (restrictive).  4. Mildly dilated left ventricular internal cavity size.  5. The left ventricle demonstrates regional wall motion abnormalities.  6. Global right ventricle has normal systolic function.The right ventricular size is normal.  7. Left  atrial size was normal.  8. Right atrial size was normal.  9. The mitral valve is normal in structure. Mild mitral valve regurgitation. No evidence of mitral stenosis. 10. The tricuspid valve is normal in structure. 11. The aortic valve is tricuspid. Aortic valve regurgitation is not visualized. Mild aortic valve sclerosis without stenosis. 12. The pulmonic valve was not well visualized. Pulmonic valve regurgitation is trivial. 13. The inferior vena cava is dilated in size with >50% respiratory variability, suggesting right atrial pressure of 8 mmHg. 14. Akinesis of the inferior, inferolateral, apical  and distal septal walls; overall severe LV dysfunction; restrictive filling; mild LVE; mild MR. FINDINGS  Left Ventricle: Left ventricular ejection fraction, by visual estimation, is 25 to 30%. The left ventricle has severely decreased function. The left ventricle demonstrates regional wall motion abnormalities. The left ventricular internal cavity size was  mildly dilated left ventricle. There is no left ventricular hypertrophy. Left ventricular diastolic parameters are consistent with Grade III diastolic dysfunction (restrictive). Elevated left atrial pressure. Right Ventricle: The right ventricular size is normal.Global RV systolic function is has normal systolic function. Left Atrium: Left atrial size was normal in size. Right Atrium: Right atrial size was normal in size Pericardium: There is no evidence of pericardial effusion. Mitral Valve: The mitral valve is normal in structure. Mild mitral valve regurgitation. No evidence of mitral valve stenosis by observation. Tricuspid Valve: The tricuspid valve is normal in structure. Tricuspid valve regurgitation is trivial. Aortic Valve: The aortic valve is tricuspid. Aortic valve regurgitation is not visualized. Mild aortic valve sclerosis is present, with no evidence of aortic valve stenosis. Pulmonic Valve: The pulmonic valve was not well visualized. Pulmonic valve  regurgitation is trivial. Pulmonic regurgitation is trivial. Aorta: The aortic root is normal in size and structure. Venous: The inferior vena cava is dilated in size with greater than 50% respiratory variability, suggesting right atrial pressure of 8 mmHg. IAS/Shunts: No atrial level shunt detected by color flow Doppler. Additional Comments: Akinesis of the inferior, inferolateral, apical and distal septal walls; overall severe LV dysfunction; restrictive filling; mild LVE; mild MR.  LEFT VENTRICLE PLAX 2D LVIDd:         5.80 cm       Diastology LVIDs:         4.60 cm       LV e' lateral:   5.33 cm/s LV PW:         0.90 cm       LV E/e' lateral: 22.3 LV IVS:        0.90 cm       LV e' medial:    3.68 cm/s LVOT diam:     2.00 cm       LV E/e' medial:  32.3 LV SV:         69 ml LV SV Index:   31.38 LVOT Area:     3.14 cm  LV Volumes (MOD) LV area d, A2C:    28.50 cm LV area d, A4C:    33.00 cm LV area s, A2C:    23.30 cm LV area s, A4C:    26.30 cm LV major d, A2C:   7.37 cm LV major d, A4C:   7.76 cm LV major s, A2C:   7.40 cm LV major s, A4C:   7.50 cm LV vol d, MOD A2C: 93.2 ml LV vol d, MOD A4C: 119.0 ml LV vol s, MOD A2C: 63.6 ml LV vol s, MOD A4C: 77.4 ml LV SV MOD A2C:     29.6 ml LV SV MOD A4C:     119.0 ml LV SV MOD BP:      37.6 ml RIGHT VENTRICLE RV S prime:     13.70 cm/s TAPSE (M-mode): 2.0 cm LEFT ATRIUM             Index       RIGHT ATRIUM           Index LA diam:        4.10 cm 1.93 cm/m  RA Area:  18.10 cm LA Vol (A2C):   60.5 ml 28.43 ml/m RA Volume:   49.30 ml  23.16 ml/m LA Vol (A4C):   68.4 ml 32.14 ml/m LA Biplane Vol: 64.2 ml 30.16 ml/m  AORTIC VALVE LVOT Vmax:   94.60 cm/s LVOT Vmean:  64.700 cm/s LVOT VTI:    0.149 m  AORTA Ao Root diam: 2.70 cm MITRAL VALVE MV Area (PHT): 3.99 cm              SHUNTS MV PHT:        55.10 msec            Systemic VTI:  0.15 m MV Decel Time: 190 msec              Systemic Diam: 2.00 cm MV E velocity: 119.00 cm/s 103 cm/s MV A velocity: 55.30 cm/s   70.3 cm/s MV E/A ratio:  2.15        1.5  Kirk Ruths MD Electronically signed by Kirk Ruths MD Signature Date/Time: 06/19/2019/3:17:27 PM    Final    CT HEAD CODE STROKE WO CONTRAST  Result Date: 06/18/2019 CLINICAL DATA:  Code stroke. 74 year old male with right side weakness and facial droop. EXAM: CT HEAD WITHOUT CONTRAST TECHNIQUE: Contiguous axial images were obtained from the base of the skull through the vertex without intravenous contrast. COMPARISON:  None. FINDINGS: Brain: Large area of hypodense cytotoxic edema demonstrated from the mesial left temporal lobe through the left occipital pole in keeping with a large left PCA territory infarct. Mild regional mass effect. No associated hemorrhage. Comparatively mild hypodensity in the left thalamus. Superimposed chronic appearing encephalomalacia in the right temporal and occipital lobes. No MCA territory acute cortically based infarct changes identified. No acute intracranial hemorrhage identified. No ventriculomegaly. No midline shift and patent basilar cisterns. Vascular: Calcified atherosclerosis at the skull base. No suspicious intracranial vascular hyperdensity. Skull: No acute osseous abnormality identified. Sinuses/Orbits: Well pneumatized paranasal sinuses. Tympanic cavities and mastoids are clear. Other: Visualized orbits and scalp soft tissues are within normal limits. ASPECTS [facility] Stroke Program Early CT Score) Total score (0-10 with 10 being normal): 10, but not applicable in this case given evidence of large acute left PCA infarct. IMPRESSION: 1. Large acute Left PCA territory infarct. No associated hemorrhage. Minor mass effect. 2. No acute anterior circulation infarct is evident (ASPECTS 10). 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 3. These results were communicated to Dr. Lorraine Lax at 2:59 pm on 06/18/2019 by text page via the [facility] messaging system. Electronically Signed   By: Genevie Ann M.D.   On: 06/18/2019 15:02     Labs:  CBC: Recent Labs    06/26/19 0613 06/27/19 0528 06/29/19 0408 07/01/19 0253  WBC 15.8* 16.1* 16.2* 14.9*  HGB 14.3 13.9 13.1 13.6  HCT 44.8 45.2 40.8 42.1  PLT 319 307 293 262    COAGS: Recent Labs    06/18/19 1439  INR 1.1  APTT 26    BMP: Recent Labs    06/27/19 0528 06/28/19 0753 06/29/19 0408 07/01/19 0253  NA 145 144 143 140  K 3.8 3.6 3.6 3.8  CL 109 108 108 105  CO2 24 25 25 23   GLUCOSE 101* 104* 97 94  BUN 31* 26* 21 17  CALCIUM 8.2* 8.2* 8.1* 8.3*  CREATININE 1.20 1.22 1.18 1.16  GFRNONAA 60* 58* >60 >60  GFRAA >60 >60 >60 >60    LIVER FUNCTION TESTS: Recent Labs    06/18/19 1439 06/18/19 1439  06/19/19 0422 06/21/19 0246 06/28/19 0753 07/01/19 0253  BILITOT 0.9  --  1.1  --  1.1  --   AST 73*  --  63*  --  37  --   ALT 60*  --  59*  --  38  --   ALKPHOS 69  --  68  --  80  --   PROT 6.7  --  6.6  --  5.3*  --   ALBUMIN 2.9*   < > 2.7* 2.4* 2.2* 2.2*   < > = values in this interval not displayed.    TUMOR MARKERS: No results for input(s): AFPTM, CEA, CA199, CHROMGRNA in the last 8760 hours.  Assessment and Plan:  74 y/o M with recent STEMI and large acute PCA infarct with residual left sided hemiparesis and dysphagia who is seen today for a percutaneous gastrostomy placement due to concern for continued aspiration with PO intake and need for long term nutrition.  Patient currently on Plavix which will need to be held x 5 days prior to procedure - will tentatively plan for gastrostomy placement 1/26 in IR. He will need to have his tube feeds and heparin SQ held at midnight on 1/26 as well.   Risks and benefits discussed with the patient's significant other Bonita Quin via phone today including, but not limited to the need for a barium enema during the procedure, bleeding, infection, peritonitis, or damage to adjacent structures.  All of the patient's significant other's questions were answered, patient's significant other  is agreeable to proceed.  Consent signed and in IR control room.  Thank you for this interesting consult.  I greatly enjoyed meeting [employer] and look forward to participating in their care.  A copy of this report was sent to the requesting provider on this date.  Electronically Signed: Joaquim Nam, PA-C 07/01/2019, 10:37 AM   I spent a total of 40 Minutes in face to face in clinical consultation, greater than 50% of which was counseling/coordinating care for gastrostomy placement.

## 2019-07-01 NOTE — Progress Notes (Signed)
Levi Pruitt  PROGRESS NOTE    Levi Pruitt  [id number] DOB: [birth year] DOA: 06/18/2019 PCP: Patient, No Pcp Per   Brief Narrative:   61 white male-known history of obesity BMI 32 presented code stroke 06/18/2019-CT = large acute PCA infarct EKG = ST elevation MI with reciprocal changes-patient started on heparin Cardiology and neurology consulted Because of dense hemiparesis as well as prior advanced care directives stating he would not want artificial nutrition, palliative care was consulted He is currently being treated for probable aspiration pneumonia in addition to volume depletion secondary to inadequate p.o. intake  07/01/19: Let's hold plavix. Will get PEG 07/06/19. Will convert CR coreg to IR. Doing ok.    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     - NPO d/t dysphagia, to have PEG placed     - per neuro:  ASA 325 and plavix DAPT for 3 months and then ASA alone     - A1c 06/19/19: 5.3     - LDL: 99, HDL: 32, Tchol: 152  Aspiration pneumonia Dysphagia     - RT had to deep suction several times on 1/15     - CXR 1/14 shows worsening with right upper lobe infiltrates-azithromycin discontinued 1/17 continue ceftriaxone and transition to oral meds once PEG tube is placed     - Speech therapy, palliative medicine and attending physician have all discussed with the family and [facility] POA understands risk benefits and alternatives to PEG tube placement which will be performed  likely 1/20     - Continue IV D5 40 cc/H for now and likely can give free water via PEG tube once it is placed     - IR consulted for PEG; appreciate assistance     - needs to be off plavix for 5 days     - CIR rec'd by PT, let's get them consulted  Acute inferior STEMI     - Initially heparin- troponins trended up     - cardiology did not feel was a candidate for anything but medical interventions Heparin IV was discontinued     - repeat echo 1/15: EF 30-35%. G3 diastolic dysfxn (essentially same as 06/19/19 echo)     - Coreg held d/t hypotension and then changed to coreg CR 10mg  daily     - ACEi held d/t BP     - can coreg CR to IR [id number] mg BID  Systolic, diastolic heart failure with pulmonary edema     - last lasix 1/15; monitor daily wts, I&Os  CKD stage II  mild hypernatremia mild metabolic acidosis     - hypernatremia ok, monitor     - acidosis resolved, monitor     - renal fxn stable, monitor  8 mm parotid neoplasm     - Outpatient follow-up  yeast intertriginous     - diflucan 100mg  qday through 07/04/19   DVT prophylaxis: SCDs Code Status: FULL Family  Communication: With dtr at bedside   Disposition Plan: To CIR? Needs PEG.   Consultants:   IR  PC   Subjective: No acute events ON per nursing.   Objective: Vitals:   07/01/19 0400 07/01/19 0600 07/01/19 0744 07/01/19 1130  BP: 109/61 117/62 133/70 117/84  Pulse: 60 61 76 95  Resp: (!) 24 (!) 24 20 (!) 25  Temp: 98.1 F (36.7 C) 97.9 F (36.6 C) 97.8 F (36.6 C) 97.6 F (36.4 C)  TempSrc: Axillary Axillary Oral Oral  SpO2: 93% 95% 90% 90%  Weight:      Height:       No intake or output data in the 24 hours ending 07/01/19 1455 Filed Weights   06/25/19 0500 06/29/19 0500 06/30/19 0540  Weight: 94.5 kg 96.6 kg 95.9 kg    Examination:  General: 74 y.o. male resting in bed in NAD Cardiovascular: RRR, +S1, S2, no m/g/r Respiratory: CTABL, no w/r/r, normal WOB GI: BS+, NDNT, no masses noted, no  organomegaly noted, NGT in place MSK: No e/c/c Neuro: alert and following commands Psyc: calm/cooperative   Data Reviewed: I have personally reviewed following labs and imaging studies.  CBC: Recent Labs  Lab 06/25/19 0319 06/26/19 0613 06/27/19 0528 06/29/19 0408 07/01/19 0253  WBC 14.0* 15.8* 16.1* 16.2* 14.9*  NEUTROABS  --   --   --  12.7* 11.7*  HGB 13.4 14.3 13.9 13.1 13.6  HCT 43.9 44.8 45.2 40.8 42.1  MCV 101.4* 99.6 99.8 97.4 97.2  PLT 337 319 307 293 [id number]   Basic Metabolic Panel: Recent Labs  Lab 06/26/19 0613 06/27/19 0528 06/28/19 0753 06/29/19 0408 07/01/19 0253  NA 147* 145 144 143 140  K 3.9 3.8 3.6 3.6 3.8  CL 110 109 108 108 105  CO2 26 24 25 25 23   GLUCOSE 101* 101* 104* 97 94  BUN 31* 31* 26* 21 17  CREATININE 1.28* 1.20 1.22 1.18 1.16  CALCIUM 8.4* 8.2* 8.2* 8.1* 8.3*  MG  --   --   --   --  1.9  PHOS  --   --   --   --  3.6   GFR: Estimated Creatinine Clearance: 64.8 mL/min (by C-G formula based on SCr of 1.16 mg/dL). Liver Function Tests: Recent Labs  Lab 06/28/19 0753 07/01/19 0253  AST 37  --   ALT 38  --   ALKPHOS 80  --   BILITOT 1.1  --   PROT 5.3*  --   ALBUMIN 2.2* 2.2*   No results for input(s): LIPASE, AMYLASE in the last 168 hours. No results for input(s): AMMONIA in the last 168 hours. Coagulation Profile: No results for input(s): INR, PROTIME in the last 168 hours. Cardiac Enzymes: No results for input(s): CKTOTAL, CKMB, CKMBINDEX, TROPONINI in the last 168 hours. BNP (last 3 results) No results for input(s): PROBNP in the last 8760 hours. HbA1C: No results for input(s): HGBA1C in the last 72 hours. CBG: Recent Labs  Lab 06/30/19 1627 06/30/19 2018 07/01/19 0056 07/01/19 0747 07/01/19 1129  GLUCAP 106* 104* 90 98 87   Lipid Profile: No results for input(s): CHOL, HDL, LDLCALC, TRIG, CHOLHDL, LDLDIRECT in the last 72 hours. Thyroid Function Tests: No results for input(s): TSH, T4TOTAL, FREET4, T3FREE,  THYROIDAB in the last 72 hours. Anemia Panel: No results for input(s): VITAMINB12, FOLATE, FERRITIN, TIBC, IRON, RETICCTPCT in the last 72 hours. Sepsis Labs: No results for input(s): PROCALCITON, LATICACIDVEN in the last 168 hours.  No results  found for this or any previous visit (from the past 240 hour(s)).    Radiology Studies: CT ABDOMEN WO CONTRAST  Result Date: 07/01/2019 CLINICAL DATA:  Previous stroke. Preop planning for gastrostomy placement. EXAM: CT ABDOMEN WITHOUT CONTRAST TECHNIQUE: Multidetector CT imaging of the abdomen was performed following the standard protocol without IV contrast. COMPARISON:  None. FINDINGS: Lower chest: Small pleural effusions right greater than left. Patchy airspace opacity centrally in the visualized lung bases. No pericardial effusion. Hepatobiliary: No focal liver abnormality is seen. No gallstones, gallbladder wall thickening, or biliary dilatation. Pancreas: Unremarkable. No pancreatic ductal dilatation or surrounding inflammatory changes. Spleen: Normal in size without focal abnormality. Adrenals/Urinary Tract: Unremarkable adrenal glands. No hydronephrosis. 2.6 cm low-attenuation mid left renal lesion possibly cyst but incompletely characterized. Stomach/Bowel: Feeding tube through the decompressed stomach into the proximal jejunum. There is a small window for percutaneous gastrostomy placement, which may be improved with gastric distension; recommend concurrent colonic opacification. The small bowel is decompressed. retained oral contrast material in the visualized colon and appendix, which are otherwise unremarkable. Vascular/Lymphatic: Aortoiliac atherosclerosis (ICD10-170.0). No abdominal or mesenteric adenopathy. Other: No ascites. No free air. Musculoskeletal: Multilevel lumbar spondylitic change. Bilateral sacroiliitis. No fracture or worrisome bone lesion. IMPRESSION: 1. There is a small window for percutaneous gastrostomy placement, which may be  improved with gastric distension; recommend concurrent colonic opacification. 2. Small bilateral pleural effusions right greater than left. Electronically Signed   By: Lucrezia Europe M.D.   On: 07/01/2019 07:47   DG Abd Portable 1V  Result Date: 06/29/2019 CLINICAL DATA:  Stroke.  Peg tube placement needed. EXAM: PORTABLE ABDOMEN - 1 VIEW COMPARISON:  06/21/2019. FINDINGS: Feeding tube noted with its tip over the proximal small bowel. No gastric or bowel distention. Contrast in the colon no free air. No acute bony abnormality. IMPRESSION: Feeding tube noted with its tip over the proximal small bowel. No gastric or bowel distention. Electronically Signed   By: Marcello Moores  Register   On: 06/29/2019 16:19     Scheduled Meds:   stroke: mapping our early stages of recovery book   Does not apply Once   aspirin  324 mg Oral Daily   atorvastatin  80 mg Oral q1800   carvedilol  3.125 mg Oral BID WC   fluconazole  100 mg Oral Daily   heparin  5,000 Units Subcutaneous Q8H   Continuous Infusions:  cefTRIAXone (ROCEPHIN)  IV 1 g (07/01/19 1320)   dextrose 40 mL/hr at 06/30/19 0202     LOS: 13 days    Time spent: 25 minutes spent in the coordination of care today.    Jonnie Finner, DO [facility]  If 7PM-7AM, please contact night-coverage [email or web address] 07/01/2019, 2:55 PM

## 2019-07-01 NOTE — Progress Notes (Signed)
Coreg and Plavix held due to non-crushable.

## 2019-07-01 NOTE — Progress Notes (Signed)
Physical Therapy Treatment Patient Details Name: Levi Pruitt MRN: [id number] DOB: 12/05/1945 Today's Date: 07/01/2019    History of Present Illness Pt is a 74 yo male admitted 06/18/19 with a stroke due to embolism of L PCA on 06/18/19. 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 Since admission, pt has been dx with possible pneumonia, acute on chronic CHF, AKI, HLD, and inferior wall STEMI. Plan for PEG placement 1/26. No PMH on file.   PT Comments    Pt seen for mobility progression. Attempted standing trials with use of Stedy frame, pt able to utilize LUE to assist but unable to achieve fully upright standing despite maxA+2 for multiple trials. Pt unable to state daughter's name, stating "Elmyra Ricks" is in room with him (daughter, [name], reports this is a Mudlogger). Continues to demonstrate R-side inattention, improved ability to use LUE to aide RUE with mobility. Pt will likely require extensive post-acute rehab. Daughter very receptive to information and education.  Seated BP 112/59 Post-mobility BP 118/71  SpO2 >/90% on RA   Follow Up Recommendations  CIR;Supervision/Assistance - 24 hour     Equipment Recommendations  Wheelchair (measurements PT);Wheelchair cushion (measurements PT)    Recommendations for Other Services       Precautions / Restrictions Precautions Precautions: Fall Precaution Comments: dense R hemiparesis with inattention; NGT Restrictions Weight Bearing Restrictions: No    Mobility  Bed Mobility Overal bed mobility: Needs Assistance Bed Mobility: Supine to Sit;Sit to Supine Rolling: Max assist;Total assist   Supine to sit: Max assist Sit to supine: Total assist;+2 for physical assistance   General bed mobility comments: MaxA to assist trunk elevation and scooting R hip to EOB; totalA+2 for return to supine.  MaxA to initiate rolling to R-side, pt able to assist with LUE when placed on hand rail; totalA rolling onto L-side  Transfers Overall transfer level: Needs assistance Equipment used: Ambulation equipment used Transfers: Sit to/from Stand Sit to Stand: Max assist;+2 physical assistance         General transfer comment: Attempted standing with stedy frame this session, stood 3x with minimal assist from LUE with max cues, pt unable to achieve fully upright posture despite maxA+2. TotalA for LUE/LLE positioning, bilateral knee instability blocked by standing frame  Ambulation/Gait                 Stairs             Wheelchair Mobility    Modified Rankin (Stroke Patients Only)       Balance Overall balance assessment: Needs assistance Sitting-balance support: Single extremity supported;Feet supported Sitting balance-Leahy Scale: Poor Sitting balance - Comments: Able to maintain static sitting for brief moments without support, following cues to lean towards L-side; R hand placed on bed to encourage WB; significant R-side lean worsening with fatigue Postural control: Right lateral lean Standing balance support: Bilateral upper extremity supported Standing balance-Leahy Scale: Zero                              Cognition Arousal/Alertness: Awake/alert Behavior During Therapy: Flat affect Overall Cognitive Status: Impaired/Different from baseline Area of Impairment: Orientation;Attention;Following commands;Safety/judgement;Awareness;Problem solving                 Orientation Level: Disoriented to;Place;Time;Situation Current Attention Level: Focused;Sustained   Following Commands: Follows one step commands  inconsistently;Follows one step commands with increased time Safety/Judgement: Decreased awareness of safety;Decreased awareness of deficits Awareness: Intellectual Problem Solving: Slow processing;Difficulty sequencing;Requires verbal  cues;Requires tactile cues;Decreased initiation General Comments: Following simple commands with L-side ~50% of session. Pt's daughter sitting on R-side of bed, pt asked to look over to her, only getting to midline and stating it was Elmyra Ricks (pt reports this is his coworker they had recently been talking about). Pt unable to state daughter's name despite cues      Exercises      General Comments General comments (skin integrity, edema, etc.): Daughter present and able to demonstrate AAROM/PROM LE exercises she has been helping pt with, including supine SLR, hip/knee flexion      Pertinent Vitals/Pain Pain Assessment: No/denies pain    Home Living                      Prior Function            PT Goals (current goals can now be found in the care plan section) Progress towards PT goals: Progressing toward goals    Frequency    Min 4X/week      PT Plan Current plan remains appropriate    Co-evaluation              AM-PAC PT "6 Clicks" Mobility   Outcome Measure  Help needed turning from your back to your side while in a flat bed without using bedrails?: A Lot Help needed moving from lying on your back to sitting on the side of a flat bed without using bedrails?: Total Help needed moving to and from a bed to a chair (including a wheelchair)?: Total Help needed standing up from a chair using your arms (e.g., wheelchair or bedside chair)?: Total Help needed to walk in hospital room?: Total Help needed climbing 3-5 steps with a railing? : Total 6 Click Score: 7    End of Session   Activity Tolerance: Patient tolerated treatment well;Patient limited by fatigue Patient left: in bed;with call bell/phone within reach;with family/visitor present;with bed alarm set Nurse Communication: Mobility status;Need for lift equipment PT Visit Diagnosis: Muscle weakness (generalized) (M62.81);Difficulty in walking, not elsewhere classified (R26.2);Unsteadiness on feet  (R26.81);Other symptoms and signs involving the nervous system (R29.898);Hemiplegia and hemiparesis Hemiplegia - Right/Left: Right Hemiplegia - dominant/non-dominant: Dominant Hemiplegia - caused by: Cerebral infarction     Time: [id number] PT Time Calculation (min) (ACUTE ONLY): 40 min  Charges:  $Therapeutic Activity: 23-37 mins $Neuromuscular Re-education: 8-22 mins                    Mabeline Caras, PT, DPT Acute Rehabilitation Services  Pager [phone] [place] 07/01/2019, 5:06 PM

## 2019-07-01 NOTE — Progress Notes (Signed)
Patient ID: Levi Pruitt, male   DOB: Feb 11, 1946, 74 y.o.   MRN: [id number]  This NP visited patient at the bedside as a follow up for  Palliative  Medicine  needs and emotional support.   Patient is weak,  Continues to have inadequate po intake, high risk for aspiration.  Plan is for PEG.   Spoke with   SO/[name]/[name], for continued conversation regarding diagnosis, prognosis, treatment option decisions, advanced directive decisions,  disposition and anticipatory care needs.   I continue to share my concern for long term poor prognosis and meaningful recovery within the context of patient's AD and patient's values.   Again  we discussed risk and benefits of PEG and the importance of placing a  time frame consideration/trial before intervention and along the course of the patient's medical path.   Plan of Care: -Full code-   Again  recommended DNR/DNI understanding poor           outcomes in similar patients, family will considered - open to PEG placement  -family is open to all offered and available medical interventions to prolong life.  They are hopeful for meaningful recovery   Patient has a docuemtebnd [name] and a Declaration of a Desire for a Natrual Death   Discussed with [name] the importance of continued conversation with family and the  medical providers regarding overall plan of care and treatment options,  ensuring decisions are within the context of the patients values and GOCs.   Education and emotional support offered  Questions and concerns addressed    Total time spent on the unit was 35 minutes  This nurse practitioner informed  the family  that I will be out of the hospital until Monday morning.  If the patient is still hospitalized I will follow-up at that time.  Call palliative medicine team phone # [phone] with questions or concerns.  Greater than 50% of the time was spent in counseling and coordination of care  [name] NP  Palliative Medicine Team Team  Phone # [phone] Pager [phone]

## 2019-07-02 ENCOUNTER — Inpatient Hospital Stay (HOSPITAL_COMMUNITY): Payer: Medicare Other

## 2019-07-02 LAB — RENAL FUNCTION PANEL
Albumin: 2.2 g/dL — ABNORMAL LOW (ref 3.5–5.0)
Anion gap: 13 (ref 5–15)
BUN: 14 mg/dL (ref 8–23)
CO2: 22 mmol/L (ref 22–32)
Calcium: 8.2 mg/dL — ABNORMAL LOW (ref 8.9–10.3)
Chloride: 104 mmol/L (ref 98–111)
Creatinine, Ser: 1 mg/dL (ref 0.61–1.24)
GFR calc Af Amer: >60 mL/min (ref >60)
GFR calc non Af Amer: >60 mL/min (ref >60)
Glucose, Bld: 88 mg/dL (ref 70–99)
Phosphorus: 3.3 mg/dL (ref 2.5–4.6)
Potassium: 3.6 mmol/L (ref 3.5–5.1)
Sodium: 139 mmol/L (ref 135–145)

## 2019-07-02 LAB — CBC WITH DIFFERENTIAL/PLATELET
Abs Immature Granulocytes: 0.15 10*3/uL — ABNORMAL HIGH (ref 0.00–0.07)
Basophils Absolute: 0.1 10*3/uL (ref 0.0–0.1)
Basophils Relative: 1 %
Eosinophils Absolute: 0.2 10*3/uL (ref 0.0–0.5)
Eosinophils Relative: 1 %
HCT: 42.5 % (ref 39.0–52.0)
Hemoglobin: 13.7 g/dL (ref 13.0–17.0)
Immature Granulocytes: 1 %
Lymphocytes Relative: 7 %
Lymphs Abs: 1 10*3/uL (ref 0.7–4.0)
MCH: 31.4 pg (ref 26.0–34.0)
MCHC: 32.2 g/dL (ref 30.0–36.0)
MCV: 97.5 fL (ref 80.0–100.0)
Monocytes Absolute: 1.6 10*3/uL — ABNORMAL HIGH (ref 0.1–1.0)
Monocytes Relative: 11 %
Neutro Abs: 11.2 10*3/uL — ABNORMAL HIGH (ref 1.7–7.7)
Neutrophils Relative %: 79 %
Platelets: 304 10*3/uL (ref 150–400)
RBC: 4.36 MIL/uL (ref 4.22–5.81)
RDW: 13.2 % (ref 11.5–15.5)
WBC: 14.1 10*3/uL — ABNORMAL HIGH (ref 4.0–10.5)
nRBC: 0 % (ref 0.0–0.2)

## 2019-07-02 LAB — MAGNESIUM: Magnesium: 1.9 mg/dL (ref 1.7–2.4)

## 2019-07-02 LAB — GLUCOSE, CAPILLARY
Glucose-Capillary: 99 mg/dL (ref 70–99)
Glucose-Capillary: 102 mg/dL — ABNORMAL HIGH (ref 70–99)
Glucose-Capillary: 107 mg/dL — ABNORMAL HIGH (ref 70–99)
Glucose-Capillary: 103 mg/dL — ABNORMAL HIGH (ref 70–99)
Glucose-Capillary: 135 mg/dL — ABNORMAL HIGH (ref 70–99)

## 2019-07-02 MED ORDER — FUROSEMIDE 10 MG/ML IJ SOLN
40.0000 mg | Freq: Once | INTRAMUSCULAR | Status: AC
  Administered 2019-07-02: 40 mg via INTRAVENOUS
  Filled 2019-07-02: qty 4

## 2019-07-02 NOTE — Progress Notes (Signed)
Called to patients room due to respiratory distress. Placed patient on 100% NRB with Sp02 97%. Nebulizer tx given and patient nasal trachea suction. Rapid Response at bedside.

## 2019-07-02 NOTE — Progress Notes (Signed)
[name]  PROGRESS NOTE    June Koestler  [id number] DOB: 02/26/1946 DOA: 06/18/2019 PCP: Patient, No Pcp Per   Brief Narrative:   54 white male-known history of obesity BMI 32 presented code stroke 06/18/2019-CT = large acute PCA infarct EKG = ST elevation MI with reciprocal changes-patient started on heparin Cardiology and neurology consulted Because of dense hemiparesis as well as prior advanced care directives stating he would not want artificial nutrition, palliative care was consulted He is currently being treated for probable aspiration pneumonia in addition to volume depletion secondary to inadequate p.o. intake  07/02/19:Will get PEG 07/06/19. Lab work looks ok. CIR to follow up on Monday. No acute changes. Continue current Tx.     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) Active Problems:   Abnormal ECG   Acute ST elevation myocardial infarction (STEMI) of inferior wall (HCC)   Acute ischemic stroke (HCC)   Parotid mass   AKI (acute kidney injury) ([place])   ST elevation myocardial infarction (STEMI) (HCC)   LV dysfunction   Palliative care by specialist   DNR (do not resuscitate) discussion   Dysphagia   Weakness generalized   Large acute PCA infarct - Significant deficits with dense hemiparesis/plegia on the left side - NPO d/t dysphagia, to have PEG placed - per neuro: ASA 325 and plavix DAPT for 3 months and then ASA alone - A1c 06/19/19: 5.3 - LDL: 99, HDL: 32, Tchol: 152  Aspiration pneumonia Dysphagia - RT had to deep suction several times on 1/15 - CXR 1/14 shows worsening with right upper lobe infiltrates-azithromycin discontinued 1/17 continue ceftriaxone and transition to oral meds once PEG tube is placed - Speech therapy, palliative medicine and attending physician have all discussed with the family and [facility] POA understands risk benefits and alternatives to PEG tube  placement which will be performed likely 1/20 - Continue IV D5 40 cc/H for now and likely can give free water via PEG tube once it is placed - IR consulted for PEG; appreciate assistance     - needs to be off plavix for 5 days     - CIR rec'd by PT, ip rehab consulted, to follow up on Monday  Acute inferior STEMI - Initially heparin- troponins trended up - cardiology did not feel was a candidate for anything but medical interventions Heparin IV was discontinued - repeat echo 1/15: EF 30-35%. G3 diastolic dysfxn (essentially same as 06/19/19 echo) - Coreg held d/t hypotension and then changed to coreg CR 10mg  daily - ACEi held d/t BP     - coreg [id number] mg BID  Systolic, diastolic heart failure with pulmonary edema - last lasix 1/15; monitor daily wts, I&Os  CKD stage II  mild hypernatremia mild metabolic acidosis - hypernatremia ok, monitor - acidosis resolved, monitor - renal fxn stable, monitor  8 mm parotid neoplasm - Outpatient follow-up  yeast intertriginous - diflucan 100mg  qday through 07/04/19   DVT prophylaxis: SCDs Code Status: FULL Family Communication: None at bedside.   Disposition Plan: Needs PEG. CIR to follow up on Monday  Consultants:   IR  PC  Antimicrobials:  . diflucan   Subjective: "Ok"  Objective: Vitals:   07/01/19 1943 07/01/19 2314 07/02/19 0414 07/02/19 0500  BP: (!) 144/66 115/62 120/65   Pulse: 70 66 66   Resp: (!) 21 (!) 21 (!) 24   Temp: 98.4 F (36.9 C) 98.2 F (36.8 C) 98.4 F (  36.9 C)   TempSrc: Oral  Oral   SpO2: 93% 96% 95%   Weight:    95 kg  Height:        Intake/Output Summary (Last 24 hours) at 07/02/2019 0725 Last data filed at 07/01/2019 1320 Gross per 24 hour  Intake 30 ml  Output --  Net 30 ml   Filed Weights   06/29/19 0500 06/30/19 0540 07/02/19 0500  Weight: 96.6 kg 95.9 kg 95 kg    Examination:  General: 74 y.o. male resting in bed in  NAD Cardiovascular: RRR, +S1, S2, no m/g/r, equal pulses throughout Respiratory: CTABL, no w/r/r, normal WOB GI: BS+, NDNT, soft, NGT in place MSK: No e/c/c Neuro:alert and following commands Psyc: calm/cooperative   Data Reviewed: I have personally reviewed following labs and imaging studies.  CBC: Recent Labs  Lab 06/26/19 0613 06/27/19 0528 06/29/19 0408 07/01/19 0253 07/02/19 0212  WBC 15.8* 16.1* 16.2* 14.9* 14.1*  NEUTROABS  --   --  12.7* 11.7* 11.2*  HGB 14.3 13.9 13.1 13.6 13.7  HCT 44.8 45.2 40.8 42.1 42.5  MCV 99.6 99.8 97.4 97.2 97.5  PLT 319 307 293 262 [id number]   Basic Metabolic Panel: Recent Labs  Lab 06/27/19 0528 06/28/19 0753 06/29/19 0408 07/01/19 0253 07/02/19 0212  NA 145 144 143 140 139  K 3.8 3.6 3.6 3.8 3.6  CL 109 108 108 105 104  CO2 24 25 25 23 22   GLUCOSE 101* 104* 97 94 88  BUN 31* 26* 21 17 14   CREATININE 1.20 1.22 1.18 1.16 1.00  CALCIUM 8.2* 8.2* 8.1* 8.3* 8.2*  MG  --   --   --  1.9 1.9  PHOS  --   --   --  3.6 3.3   GFR: Estimated Creatinine Clearance: 74.8 mL/min (by C-G formula based on SCr of 1 mg/dL). Liver Function Tests: Recent Labs  Lab 06/28/19 0753 07/01/19 0253 07/02/19 0212  AST 37  --   --   ALT 38  --   --   ALKPHOS 80  --   --   BILITOT 1.1  --   --   PROT 5.3*  --   --   ALBUMIN 2.2* 2.2* 2.2*   No results for input(s): LIPASE, AMYLASE in the last 168 hours. No results for input(s): AMMONIA in the last 168 hours. Coagulation Profile: No results for input(s): INR, PROTIME in the last 168 hours. Cardiac Enzymes: No results for input(s): CKTOTAL, CKMB, CKMBINDEX, TROPONINI in the last 168 hours. BNP (last 3 results) No results for input(s): PROBNP in the last 8760 hours. HbA1C: No results for input(s): HGBA1C in the last 72 hours. CBG: Recent Labs  Lab 07/01/19 1129 07/01/19 1608 07/01/19 1937 07/01/19 2312 07/02/19 0422  GLUCAP 87 85 91 82 99   Lipid Profile: No results for input(s): CHOL, HDL,  LDLCALC, TRIG, CHOLHDL, LDLDIRECT in the last 72 hours. Thyroid Function Tests: No results for input(s): TSH, T4TOTAL, FREET4, T3FREE, THYROIDAB in the last 72 hours. Anemia Panel: No results for input(s): VITAMINB12, FOLATE, FERRITIN, TIBC, IRON, RETICCTPCT in the last 72 hours. Sepsis Labs: No results for input(s): PROCALCITON, LATICACIDVEN in the last 168 hours.  No results found for this or any previous visit (from the past 240 hour(s)).    Radiology Studies: CT ABDOMEN WO CONTRAST  Result Date: 07/01/2019 CLINICAL DATA:  Previous stroke. Preop planning for gastrostomy placement. EXAM: CT ABDOMEN WITHOUT CONTRAST TECHNIQUE: Multidetector CT imaging of the abdomen was performed following  the standard protocol without IV contrast. COMPARISON:  None. FINDINGS: Lower chest: Small pleural effusions right greater than left. Patchy airspace opacity centrally in the visualized lung bases. No pericardial effusion. Hepatobiliary: No focal liver abnormality is seen. No gallstones, gallbladder wall thickening, or biliary dilatation. Pancreas: Unremarkable. No pancreatic ductal dilatation or surrounding inflammatory changes. Spleen: Normal in size without focal abnormality. Adrenals/Urinary Tract: Unremarkable adrenal glands. No hydronephrosis. 2.6 cm low-attenuation mid left renal lesion possibly cyst but incompletely characterized. Stomach/Bowel: Feeding tube through the decompressed stomach into the proximal jejunum. There is a small window for percutaneous gastrostomy placement, which may be improved with gastric distension; recommend concurrent colonic opacification. The small bowel is decompressed. retained oral contrast material in the visualized colon and appendix, which are otherwise unremarkable. Vascular/Lymphatic: Aortoiliac atherosclerosis (ICD10-170.0). No abdominal or mesenteric adenopathy. Other: No ascites. No free air. Musculoskeletal: Multilevel lumbar spondylitic change. Bilateral  sacroiliitis. No fracture or worrisome bone lesion. IMPRESSION: 1. There is a small window for percutaneous gastrostomy placement, which may be improved with gastric distension; recommend concurrent colonic opacification. 2. Small bilateral pleural effusions right greater than left. Electronically Signed   By: Lucrezia Europe M.D.   On: 07/01/2019 07:47     Scheduled Meds: .  stroke: mapping our early stages of recovery book   Does not apply Once  . aspirin  324 mg Oral Daily  . atorvastatin  80 mg Oral q1800  . carvedilol  3.125 mg Oral BID WC  . fluconazole  100 mg Oral Daily  . heparin  5,000 Units Subcutaneous Q8H   Continuous Infusions: . cefTRIAXone (ROCEPHIN)  IV 1 g (07/01/19 1320)  . dextrose 40 mL/hr at 07/02/19 0030     LOS: 14 days    Time spent: 25 minutes spent in the coordination of care today.    Jonnie Finner, DO [facility]  If 7PM-7AM, please contact night-coverage [email or web address] 07/02/2019, 7:25 AM

## 2019-07-02 NOTE — Progress Notes (Signed)
Occupational Therapy Treatment Patient Details Name: Levi Pruitt MRN: [id number] DOB: 1945/09/19 Today's Date: 07/02/2019    History of present illness Pt is a 74 yo male admitted 06/18/19 with a stroke due to embolism of L PCA on 06/18/19. 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 Since admission, pt has been dx with possible pneumonia, acute on chronic CHF, AKI, HLD, and inferior wall STEMI. Plan for PEG placement 1/26. No PMH on file.   OT comments  Pt making steady progress towards OT goals this session.Pt able to complete lateral scoot transfer from EOB>drop arm recliner to L side with MAX A +2. Pt required total A for seated UB ADL once in chair for cleanliness. Pt attempting to communicate with therapist with majority of speech unintelligible. Continue to recommend CIR level therapy at DC. Will follow acutely per POC.    Follow Up Recommendations  CIR;Supervision/Assistance - 24 hour    Equipment Recommendations  Other (comment)(TBD to next venue of care)    Recommendations for Other Services      Precautions / Restrictions Precautions Precautions: Fall Precaution Comments: dense R hemiparesis with inattention; NGT; bowel/bladder incontinence Restrictions Weight Bearing Restrictions: No       Mobility Bed Mobility Overal bed mobility: Needs Assistance Bed Mobility: Supine to Sit;Rolling Rolling: Max assist;Total assist   Supine to sit: Max assist;+2 for physical assistance     General bed mobility comments: MaxA to initiate rolling to R-side, pt able to assist with LUE when placed on hand rail; totalA rolling onto L-side. MaxA+2 for trunk elevation and scooting hips to sit on R-side of bed  Transfers Overall transfer level: Needs assistance Equipment used: 2 person hand held assist Transfers: Lateral/Scoot Transfers           Lateral/Scoot Transfers: Max assist;+2 physical assistance General transfer comment: Pt performed lateral scoot transfer from bed to drop-arm recliner towards L-side with maxA+2 and use of bed pad; pt able to assist with repositioning LLE and using LUE for support, totalA for repositioning R-side    Balance Overall balance assessment: Needs assistance Sitting-balance support: Single extremity supported;Feet supported Sitting balance-Leahy Scale: Poor Sitting balance - Comments: Able to maintain static sitting for brief moments without support, following cues to lean towards L-side                                   ADL either performed or assessed with clinical judgement   ADL Overall ADL's : Needs assistance/impaired     Grooming: Wash/dry face;Sitting;Total assistance Grooming Details (indicate cue type and reason): total A to wash face d/t level of arousal                     Toileting- Clothing Manipulation and Hygiene: Total assistance;+2 for safety/equipment;+2 for physical assistance;Bed level Toileting - Clothing Manipulation Details (indicate cue type and reason): total assist for bed level hygiene      Functional mobility during ADLs: Maximal assistance;+2 for physical assistance;Total assistance;+2 for safety/equipment(bed mobility and total A +2 stand pivot to recliner to L side) General ADL Comments: pt continues to require Total - max A +2 for all ADLs and functional mobility at this time     Vision Baseline Vision/History: Wears glasses Wears Glasses: At all times     Perception  Praxis      Cognition Arousal/Alertness: Awake/alert Behavior During Therapy: Flat affect Overall Cognitive Status: Impaired/Different from baseline Area of Impairment: Attention;Following commands;Safety/judgement;Awareness;Problem solving                   Current Attention Level: Focused;Sustained   Following Commands: Follows one step  commands inconsistently;Follows one step commands with increased time Safety/Judgement: Decreased awareness of safety;Decreased awareness of deficits Awareness: Intellectual Problem Solving: Slow processing;Difficulty sequencing;Requires verbal cues;Requires tactile cues;Decreased initiation          Exercises     Shoulder Instructions       General Comments Doffed O2 for functional transfer with O2 decreasing to 86% on RA. Post mobility BP 134/67 with pt seatd in recliner with legs down    Pertinent Vitals/ Pain       Pain Assessment: No/denies pain  Home Living                                          Prior Functioning/Environment              Frequency  Min 2X/week        Progress Toward Goals  OT Goals(current goals can now be found in the care plan section)  Progress towards OT goals: Progressing toward goals  Acute Rehab OT Goals Patient Stated Goal: Hopeful for eventual return home after post-acute rehab OT Goal Formulation: Patient unable to participate in goal setting Time For Goal Achievement: 07/08/19 Potential to Achieve Goals: [place] Discharge plan remains appropriate;Frequency remains appropriate    Co-evaluation      Reason for Co-Treatment: Complexity of the patient's impairments (multi-system involvement);For patient/therapist safety;To address functional/ADL transfers PT goals addressed during session: Mobility/safety with mobility;Balance        AM-PAC OT "6 Clicks" Daily Activity     Outcome Measure   Help from another person eating meals?: Total Help from another person taking care of personal grooming?: A Lot Help from another person toileting, which includes using toliet, bedpan, or urinal?: Total Help from another person bathing (including washing, rinsing, drying)?: Total Help from another person to put on and taking off regular upper body clothing?: Total Help from another person to put on and taking off  regular lower body clothing?: Total 6 Click Score: 7    End of Session Equipment Utilized During Treatment: Oxygen;Other (comment);Gait belt(3L O2)  OT Visit Diagnosis: Other abnormalities of gait and mobility (R26.89);Muscle weakness (generalized) (M62.81);Hemiplegia and hemiparesis;Cognitive communication deficit (R41.841);Other symptoms and signs involving cognitive function Symptoms and signs involving cognitive functions: Cerebral infarction Hemiplegia - Right/Left: Right Hemiplegia - dominant/non-dominant: Dominant Hemiplegia - caused by: Cerebral infarction   Activity Tolerance Patient tolerated treatment well   Patient Left in chair;with call bell/phone within reach   Nurse Communication Mobility status;Other (comment)(needs to be lifted back to bed around 4 pm)        Time: [id number] OT Time Calculation (min): 37 min  Charges: OT General Charges $OT Visit: 1 Visit OT Treatments $Self Care/Home Management : 8-22 mins   Lanier Clam., COTA/L Acute Rehabilitation Services [phone]    Ihor Gully 07/02/2019, 3:58 PM

## 2019-07-02 NOTE — Significant Event (Addendum)
Rapid Response Event Note  Overview:Called d/t resp distress, pt SpO2 low(60s) on 10L HFNC, respiratory on way to see pt-asked RN to increase pt to 15L. Time Called: 2006 Arrival Time: 2014 Event Type: Respiratory  Initial Focused Assessment: Pt laying in bed in mild distress, RR mid 20s, Sp02-100% while getting breathing tx. Lungs with wheezes originally per RT but now with crackles/rhonchi t/o. Pt had been placed on NRB and NTS sx'd by RT prior to my arrival and a duoneb was started.   Interventions: NRB NTS, duoneb PTA RRT PCXR 40mg  lasix IV x 1 Plan of Care (if not transferred): Await PCXR results, give lasix and monitor response.  Wean NRB mask if SpO2 allows. Call RRT if further assistance needed.  2120-PCXR-Bilateral confluent densities may represent pneumonia or edema. Pt resp distress better, RR-20, SpO2-99% on NRB.  Plan: attempt to wean off NRB if SpO2 allows.   Event End:2120 Outcome: Stayed on unit and stabilized  Event Summary: Name of Physician Notified: Kennon Holter, NP at 2019    at          [place], Carren Rang

## 2019-07-02 NOTE — Progress Notes (Signed)
Physical Therapy Treatment Patient Details Name: Levi Pruitt MRN: [id number] DOB: 13-Jun-1945 Today's Date: 07/02/2019    History of Present Illness Pt is a 74 yo male admitted 06/18/19 with a stroke due to embolism of L PCA on 06/18/19. 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 Since admission, pt has been dx with possible pneumonia, acute on chronic CHF, AKI, HLD, and inferior wall STEMI. Plan for PEG placement 1/26. No PMH on file.   PT Comments    Pt seen for additional session. Tolerated sitting in recliner ~1.25 hrs, although with significant L-lateral lean having slid forward in chair. Transfer back to bed with use of maximove lift, pt tolerated well. Noted increased fatigue; pt with difficulty using LUE to attend to RUE this session. Daughter reports pt much more alert while seated in chair, attempted to feed self with L hand although unable. Cough remains unproductive.    Follow Up Recommendations  CIR;Supervision/Assistance - 24 hour     Equipment Recommendations  (TBD)    Recommendations for Other Services Rehab consult     Precautions / Restrictions Precautions Precautions: Fall Precaution Comments: dense R hemiparesis with inattention; NGT; bowel/bladder incontinence Restrictions Weight Bearing Restrictions: No    Mobility  Bed Mobility Overal bed mobility: Needs Assistance Bed Mobility: Rolling Rolling: Max assist;Total assist   Supine to sit: Max assist;+2 for physical assistance     General bed mobility comments: MaxA to initiate rolling to R-side, pt able to assist with LUE when placed on hand rail; totalA rolling onto L-side. MaxA+2 for trunk elevation and scooting hips to sit on R-side of bed  Transfers Overall transfer level: Needs assistance Equipment used: 2 person hand held assist Transfers: Lateral/Scoot  Transfers          Lateral/Scoot Transfers: Max assist;+2 physical assistance General transfer comment: Pt transferred from recliner to bed with use of maximove lift, tolerated well. Increased fatigue with more difficulty noted using LUE to support RUE when cued  Ambulation/Gait                 Stairs             Wheelchair Mobility    Modified Rankin (Stroke Patients Only)       Balance Overall balance assessment: Needs assistance Sitting-balance support: Single extremity supported;Feet supported Sitting balance-Leahy Scale: Poor Sitting balance - Comments: Able to maintain static sitting for brief moments without support, following cues to lean towards L-side                                    Cognition Arousal/Alertness: Awake/alert Behavior During Therapy: Flat affect Overall Cognitive Status: Impaired/Different from baseline Area of Impairment: Attention;Following commands;Safety/judgement;Awareness;Problem solving                   Current Attention Level: Focused;Sustained   Following Commands: Follows one step commands inconsistently;Follows one step commands with increased time Safety/Judgement: Decreased awareness of safety;Decreased awareness of deficits Awareness: Intellectual Problem Solving: Slow processing;Difficulty sequencing;Requires verbal cues;Requires tactile cues;Decreased initiation        Exercises      General Comments General comments (skin integrity, edema, etc.): Doffed O2 for functional transfer with O2 decreasing to 86% on RA. Post mobility BP 134/67 with pt seatd in recliner with legs down  Pertinent Vitals/Pain Pain Assessment: Faces Faces Pain Scale: No hurt Pain Intervention(s): Monitored during session    Home Living                      Prior Function            PT Goals (current goals can now be found in the care plan section) Acute Rehab PT Goals Patient Stated Goal:  Hopeful for eventual return home after post-acute rehab PT Goal Formulation: With family Time For Goal Achievement: 07/15/19 Potential to Achieve Goals: Fair Progress towards PT goals: Progressing toward goals    Frequency    Min 4X/week      PT Plan Current plan remains appropriate    Co-evaluation PT/OT/SLP Co-Evaluation/Treatment: Yes Reason for Co-Treatment: Complexity of the patient's impairments (multi-system involvement);For patient/therapist safety;To address functional/ADL transfers PT goals addressed during session: Mobility/safety with mobility;Balance        AM-PAC PT "6 Clicks" Mobility   Outcome Measure  Help needed turning from your back to your side while in a flat bed without using bedrails?: A Lot Help needed moving from lying on your back to sitting on the side of a flat bed without using bedrails?: Total Help needed moving to and from a bed to a chair (including a wheelchair)?: Total Help needed standing up from a chair using your arms (e.g., wheelchair or bedside chair)?: Total Help needed to walk in hospital room?: Total Help needed climbing 3-5 steps with a railing? : Total 6 Click Score: 7    End of Session Equipment Utilized During Treatment: Oxygen Activity Tolerance: Patient tolerated treatment well;Patient limited by fatigue Patient left: in bed;with call bell/phone within reach;with bed alarm set Nurse Communication: Mobility status;Need for lift equipment PT Visit Diagnosis: Muscle weakness (generalized) (M62.81);Difficulty in walking, not elsewhere classified (R26.2);Unsteadiness on feet (R26.81);Other symptoms and signs involving the nervous system (R29.898);Hemiplegia and hemiparesis Hemiplegia - Right/Left: Right Hemiplegia - dominant/non-dominant: Dominant Hemiplegia - caused by: Cerebral infarction     Time: [id number] PT Time Calculation (min) (ACUTE ONLY): 19 min  Charges:  $Therapeutic Activity: 8-22 mins                      Mabeline Caras, PT, DPT Acute Rehabilitation Services  Pager [phone] Office [place] 07/02/2019, 5:18 PM

## 2019-07-02 NOTE — Progress Notes (Signed)
Inpatient Rehab Admissions:  Inpatient Rehab Consult received.  I met with patient and his daughter, [name], at the bedside for rehabilitation assessment and to discuss goals and expectations of an inpatient rehab admission.  They are both hopeful for CIR and recognize that he will need significant care at discharge from our facility.  We discussed having a conference call with the family on Monday.  As I have no beds available through the weekend, I think it's reasonable to f/u with them at that time.  PEG to be placed on 1/26, per documentation.    Signed: Shann Medal, PT, DPT Admissions Coordinator [phone] 07/02/19  2:22 PM

## 2019-07-02 NOTE — Progress Notes (Signed)
Physical Therapy Treatment Patient Details Name: Levi Pruitt MRN: [id number] DOB: December 22, 1945 Today's Date: 07/02/2019    History of Present Illness Pt is a 74 yo male admitted 06/18/19 with a stroke due to embolism of L PCA on 06/18/19. 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 Since admission, pt has been dx with possible pneumonia, acute on chronic CHF, AKI, HLD, and inferior wall STEMI. Plan for PEG placement 1/26. No PMH on file.   PT Comments    Pt progressing with mobility. Tolerated lateral scoot transfer to recliner with maxA+2, able to assist with LUE/LLE. Pt with dry cough and bowel incontinence during session. More talkative, although difficult to understand at times. Attempting to scan towards R-side with max cues. Daughter [name]) present and supportive. Continue to recommend intensive CIR-level therapies to maximize functional mobility and decrease caregiver burden. Pt remains motivated to participate.   Follow Up Recommendations  CIR;Supervision/Assistance - 24 hour     Equipment Recommendations  (TBD)    Recommendations for Other Services       Precautions / Restrictions Precautions Precautions: Fall Precaution Comments: dense R hemiparesis with inattention; NGT; bowel/bladder incontinence Restrictions Weight Bearing Restrictions: No    Mobility  Bed Mobility Overal bed mobility: Needs Assistance Bed Mobility: Supine to Sit;Rolling Rolling: Max assist;Total assist   Supine to sit: Max assist;+2 for physical assistance     General bed mobility comments: MaxA to initiate rolling to R-side, pt able to assist with LUE when placed on hand rail; totalA rolling onto L-side. MaxA+2 for trunk elevation and scooting hips to sit on R-side of bed  Transfers Overall transfer level: Needs assistance Equipment used: 1 person  hand held assist Transfers: Lateral/Scoot Transfers          Lateral/Scoot Transfers: Max assist;+2 physical assistance General transfer comment: Pt performed lateral scoot transfer from bed to drop-arm recliner towards L-side with maxA+2 and use of bed pad; pt able to assist with repositioning LLE and using LUE for support, totalA for repositioning R-side  Ambulation/Gait                 Stairs             Wheelchair Mobility    Modified Rankin (Stroke Patients Only)       Balance Overall balance assessment: Needs assistance Sitting-balance support: Single extremity supported;Feet supported Sitting balance-Leahy Scale: Poor Sitting balance - Comments: Able to maintain static sitting for brief moments without support, following cues to lean towards L-side                                    Cognition Arousal/Alertness: Awake/alert Behavior During Therapy: Flat affect Overall Cognitive Status: Impaired/Different from baseline Area of Impairment: Attention;Following commands;Safety/judgement;Awareness;Problem solving;Memory                   Current Attention Level: Focused;Sustained   Following Commands: Follows one step commands inconsistently;Follows one step commands with increased time Safety/Judgement: Decreased awareness of safety;Decreased awareness of deficits Awareness: Intellectual Problem Solving: Slow processing;Difficulty sequencing;Requires verbal cues;Requires tactile cues;Decreased initiation        Exercises      General Comments General comments (skin integrity, edema, etc.): SpO2 down to 86% on RA, maintaining >/90% on 2L O2 [place]. Post-mobility BP 134/67 while seated  in recliner      Pertinent Vitals/Pain Pain Assessment: No/denies pain    Home Living                      Prior Function            PT Goals (current goals can now be found in the care plan section) Acute Rehab PT Goals Patient  Stated Goal: Hopeful for eventual return home after post-acute rehab PT Goal Formulation: With family Time For Goal Achievement: 07/15/19 Potential to Achieve Goals: Fair Progress towards PT goals: Progressing toward goals    Frequency    Min 4X/week      PT Plan Current plan remains appropriate    Co-evaluation PT/OT/SLP Co-Evaluation/Treatment: Yes Reason for Co-Treatment: Complexity of the patient's impairments (multi-system involvement);For patient/therapist safety;To address functional/ADL transfers PT goals addressed during session: Mobility/safety with mobility;Balance        AM-PAC PT "6 Clicks" Mobility   Outcome Measure  Help needed turning from your back to your side while in a flat bed without using bedrails?: A Lot Help needed moving from lying on your back to sitting on the side of a flat bed without using bedrails?: Total Help needed moving to and from a bed to a chair (including a wheelchair)?: Total Help needed standing up from a chair using your arms (e.g., wheelchair or bedside chair)?: Total Help needed to walk in hospital room?: Total Help needed climbing 3-5 steps with a railing? : Total 6 Click Score: 7    End of Session Equipment Utilized During Treatment: Oxygen;Gait belt Activity Tolerance: Patient tolerated treatment well Patient left: in chair;with call bell/phone within reach;with chair alarm set Nurse Communication: Mobility status;Need for lift equipment PT Visit Diagnosis: Muscle weakness (generalized) (M62.81);Difficulty in walking, not elsewhere classified (R26.2);Unsteadiness on feet (R26.81);Other symptoms and signs involving the nervous system (R29.898);Hemiplegia and hemiparesis Hemiplegia - Right/Left: Right Hemiplegia - dominant/non-dominant: Dominant Hemiplegia - caused by: Cerebral infarction     Time: [id number] PT Time Calculation (min) (ACUTE ONLY): 37 min  Charges:  $Therapeutic Activity: 8-22 mins                      Mabeline Caras, PT, DPT Acute Rehabilitation Services  Pager [phone] Office [place] 07/02/2019, 3:18 PM

## 2019-07-02 NOTE — Progress Notes (Addendum)
2000 Pt desating in the 80's, HOB elevated, 02 via [place] increased to 6l, pt congested, RT was called.  2005 RRT was called, pt sats dropping, Respiratory therapist arrived and suctioned pt with good results, non rebreather placed on pt and sats elevated to 96%.  2015 RRT arrived and evaluated pt, will call Dr Kennon Holter for CXR , will cont to monitor. Breathing TX given by RT.  2040 Portable CXR done.  2048 Lasix 40 mg IVP given at this time, will cont to monitor.

## 2019-07-03 LAB — CBC WITH DIFFERENTIAL/PLATELET
Abs Immature Granulocytes: 0.14 10*3/uL — ABNORMAL HIGH (ref 0.00–0.07)
Basophils Absolute: 0.1 10*3/uL (ref 0.0–0.1)
Basophils Relative: 0 %
Eosinophils Absolute: 0.1 10*3/uL (ref 0.0–0.5)
Eosinophils Relative: 0 %
HCT: 44.3 % (ref 39.0–52.0)
Hemoglobin: 14.6 g/dL (ref 13.0–17.0)
Immature Granulocytes: 1 %
Lymphocytes Relative: 5 %
Lymphs Abs: 1 10*3/uL (ref 0.7–4.0)
MCH: 31.1 pg (ref 26.0–34.0)
MCHC: 33 g/dL (ref 30.0–36.0)
MCV: 94.3 fL (ref 80.0–100.0)
Monocytes Absolute: 1.8 10*3/uL — ABNORMAL HIGH (ref 0.1–1.0)
Monocytes Relative: 9 %
Neutro Abs: 16.6 10*3/uL — ABNORMAL HIGH (ref 1.7–7.7)
Neutrophils Relative %: 85 %
Platelets: 285 10*3/uL (ref 150–400)
RBC: 4.7 MIL/uL (ref 4.22–5.81)
RDW: 13.3 % (ref 11.5–15.5)
WBC: 19.7 10*3/uL — ABNORMAL HIGH (ref 4.0–10.5)
nRBC: 0 % (ref 0.0–0.2)

## 2019-07-03 LAB — RENAL FUNCTION PANEL
Albumin: 2.4 g/dL — ABNORMAL LOW (ref 3.5–5.0)
Anion gap: 12 (ref 5–15)
BUN: 15 mg/dL (ref 8–23)
CO2: 25 mmol/L (ref 22–32)
Calcium: 8.3 mg/dL — ABNORMAL LOW (ref 8.9–10.3)
Chloride: 101 mmol/L (ref 98–111)
Creatinine, Ser: 1.25 mg/dL — ABNORMAL HIGH (ref 0.61–1.24)
GFR calc Af Amer: >60 mL/min (ref >60)
GFR calc non Af Amer: 57 mL/min — ABNORMAL LOW (ref >60)
Glucose, Bld: 108 mg/dL — ABNORMAL HIGH (ref 70–99)
Phosphorus: 3.7 mg/dL (ref 2.5–4.6)
Potassium: 3.7 mmol/L (ref 3.5–5.1)
Sodium: 138 mmol/L (ref 135–145)

## 2019-07-03 LAB — GLUCOSE, CAPILLARY
Glucose-Capillary: 121 mg/dL — ABNORMAL HIGH (ref 70–99)
Glucose-Capillary: 92 mg/dL (ref 70–99)
Glucose-Capillary: 94 mg/dL (ref 70–99)
Glucose-Capillary: 94 mg/dL (ref 70–99)
Glucose-Capillary: 97 mg/dL (ref 70–99)
Glucose-Capillary: 97 mg/dL (ref 70–99)

## 2019-07-03 LAB — MAGNESIUM: Magnesium: 1.9 mg/dL (ref 1.7–2.4)

## 2019-07-03 MED ORDER — FLUCONAZOLE 100 MG PO TABS
100.0000 mg | ORAL_TABLET | Freq: Every day | ORAL | Status: DC
  Administered 2019-07-03 – 2019-07-05 (×3): 100 mg
  Filled 2019-07-03 (×3): qty 1

## 2019-07-03 MED ORDER — ATORVASTATIN CALCIUM 80 MG PO TABS
80.0000 mg | ORAL_TABLET | Freq: Every day | ORAL | Status: DC
  Administered 2019-07-03 – 2019-07-04 (×2): 80 mg
  Filled 2019-07-03 (×3): qty 1

## 2019-07-03 MED ORDER — CARVEDILOL 3.125 MG PO TABS
3.1250 mg | ORAL_TABLET | Freq: Two times a day (BID) | ORAL | Status: DC
  Administered 2019-07-03 – 2019-07-07 (×6): 3.125 mg
  Filled 2019-07-03 (×8): qty 1

## 2019-07-03 MED ORDER — ASPIRIN 81 MG PO CHEW
324.0000 mg | CHEWABLE_TABLET | Freq: Every day | ORAL | Status: DC
  Administered 2019-07-03 – 2019-07-07 (×4): 324 mg
  Filled 2019-07-03 (×5): qty 4

## 2019-07-03 NOTE — Progress Notes (Signed)
[name]  PROGRESS NOTE    Levi Pruitt  [id number] DOB: 04/08/1946 DOA: 06/18/2019 PCP: Patient, No Pcp Per   Brief Narrative:   64 white male-known history of obesity BMI 32 presented code stroke 06/18/2019-CT = large acute PCA infarct EKG = ST elevation MI with reciprocal changes-patient started on heparin Cardiology and neurology consulted Because of dense hemiparesis as well as prior advanced care directives stating he would not want artificial nutrition, palliative care was consulted He is currently being treated for probable aspiration pneumonia in addition to volume depletion secondary to inadequate p.o. intake  07/03/19:Will get PEG 07/06/19. Some difficulty with aspiration/UAT last night. S/p lasix. Lungs sound good today; but has UAT. Needs suction. White count up a bit but no fever. Watch off abx for now. CIR to follow up on Monday.    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) Active Problems:   Abnormal ECG   Acute ST elevation myocardial infarction (STEMI) of inferior wall (HCC)   Acute ischemic stroke (HCC)   Parotid mass   AKI (acute kidney injury) ([place])   ST elevation myocardial infarction (STEMI) (HCC)   LV dysfunction   Palliative care by specialist   DNR (do not resuscitate) discussion   Dysphagia   Weakness generalized  Large acute PCA infarct - Significant deficits with dense hemiparesis/plegia on the left side - NPO d/t dysphagia, to have PEG placed - per neuro: ASA 325 and plavix DAPT for 3 months and then ASA alone - A1c 06/19/19: 5.3 - LDL: 99, HDL: 32, Tchol: 152  Aspiration pneumonia Dysphagia - RT had to deep suction several times on 1/15 - CXR 1/14 shows worsening with right upper lobe infiltrates-azithromycin discontinued 1/17 continue ceftriaxone and transition to oral meds once PEG tube is placed - Speech therapy, palliative medicine and attending physician  have all discussed with the family and [facility] POA understands risk benefits and alternatives to PEG tube placement which will be performed likely 1/20 - Continue IV D5 40 cc/H for now and likely can give free water via PEG tube once it is placed - IR consulted for PEG; appreciate assistance - needs to be off plavix for 5 days - CIR rec'd by PT, ip rehab consulted, to follow up on Monday     - 07/03/19: likely had aspiration last night, got lasix, but still with UAT; needs suction; wean O2 as able, WBC up, but no fever; hold abx for now  Acute inferior STEMI - Initially heparin- troponins trended up - cardiology did not feel was a candidate for anything but medical interventions Heparin IV was discontinued - repeat echo 1/15: EF 30-35%. G3 diastolic dysfxn (essentially same as 06/19/19 echo) - Coreg held d/t hypotension and then changed to coreg CR 10mg  daily - ACEi held d/t BP - coreg [id number] mg BID  Systolic, diastolic heart failure with pulmonary edema - last lasix 1/15; monitor daily wts, I&Os  CKD stage II  mild hypernatremia mild metabolic acidosis - hypernatremia ok, monitor - acidosis resolved, monitor - Scr up after lasix, monitor for now.  8 mm parotid neoplasm - Outpatient follow-up  yeast intertriginous - diflucan 100mg  qday through 07/04/19  DVT prophylaxis: SCDs Code Status: FULL Family Communication: None at bedside   Disposition Plan: Needs PEG. CIR to follow up on Monday  Consultants:   IR  PC  Antimicrobials:  . diflucan   Subjective: Aspiration event ON.  Objective: Vitals:  07/03/19 0800 07/03/19 0842 07/03/19 1000 07/03/19 1218  BP: (!) 102/57 (!) 102/57 (!) 103/58 120/74  Pulse: (!) 58 65 (!) 57   Resp: 19  (!) 23   Temp:  97.6 F (36.4 C)    TempSrc:  Oral    SpO2: 95%  98%   Weight:      Height:        Intake/Output Summary (Last 24 hours) at 07/03/2019 1459 Last data  filed at 07/02/2019 2317 Gross per 24 hour  Intake --  Output 850 ml  Net -850 ml   Filed Weights   06/29/19 0500 06/30/19 0540 07/02/19 0500  Weight: 96.6 kg 95.9 kg 95 kg    Examination:  General: 74 y.o. male resting in bed in NAD Cardiovascular: RRR, +S1, S2, no m/g/r Respiratory: clear anteriorly, no w/r/r, but he does have UAT, needs suction GI: BS+, NDNT, no masses noted, NGT in place MSK: No e/c/c Neuro: somnolent   Data Reviewed: I have personally reviewed following labs and imaging studies.  CBC: Recent Labs  Lab 06/27/19 0528 06/29/19 0408 07/01/19 0253 07/02/19 0212 07/03/19 0429  WBC 16.1* 16.2* 14.9* 14.1* 19.7*  NEUTROABS  --  12.7* 11.7* 11.2* 16.6*  HGB 13.9 13.1 13.6 13.7 14.6  HCT 45.2 40.8 42.1 42.5 44.3  MCV 99.8 97.4 97.2 97.5 94.3  PLT 307 293 262 304 [id number]   Basic Metabolic Panel: Recent Labs  Lab 06/28/19 0753 06/29/19 0408 07/01/19 0253 07/02/19 0212 07/03/19 0429  NA 144 143 140 139 138  K 3.6 3.6 3.8 3.6 3.7  CL 108 108 105 104 101  CO2 25 25 23 22 25   GLUCOSE 104* 97 94 88 108*  BUN 26* 21 17 14 15   CREATININE 1.22 1.18 1.16 1.00 1.25*  CALCIUM 8.2* 8.1* 8.3* 8.2* 8.3*  MG  --   --  1.9 1.9 1.9  PHOS  --   --  3.6 3.3 3.7   GFR: Estimated Creatinine Clearance: 59.9 mL/min (A) (by C-G formula based on SCr of 1.25 mg/dL (H)). Liver Function Tests: Recent Labs  Lab 06/28/19 0753 07/01/19 0253 07/02/19 0212 07/03/19 0429  AST 37  --   --   --   ALT 38  --   --   --   ALKPHOS 80  --   --   --   BILITOT 1.1  --   --   --   PROT 5.3*  --   --   --   ALBUMIN 2.2* 2.2* 2.2* 2.4*   No results for input(s): LIPASE, AMYLASE in the last 168 hours. No results for input(s): AMMONIA in the last 168 hours. Coagulation Profile: No results for input(s): INR, PROTIME in the last 168 hours. Cardiac Enzymes: No results for input(s): CKTOTAL, CKMB, CKMBINDEX, TROPONINI in the last 168 hours. BNP (last 3 results) No results for  input(s): PROBNP in the last 8760 hours. HbA1C: No results for input(s): HGBA1C in the last 72 hours. CBG: Recent Labs  Lab 07/02/19 2048 07/03/19 0027 07/03/19 0346 07/03/19 0845 07/03/19 1217  GLUCAP 135* 94 97 94 97   Lipid Profile: No results for input(s): CHOL, HDL, LDLCALC, TRIG, CHOLHDL, LDLDIRECT in the last 72 hours. Thyroid Function Tests: No results for input(s): TSH, T4TOTAL, FREET4, T3FREE, THYROIDAB in the last 72 hours. Anemia Panel: No results for input(s): VITAMINB12, FOLATE, FERRITIN, TIBC, IRON, RETICCTPCT in the last 72 hours. Sepsis Labs: No results for input(s): PROCALCITON, LATICACIDVEN in the last 168 hours.  No results found for this or any previous visit (from the past 240 hour(s)).    Radiology Studies: DG CHEST PORT 1 VIEW  Result Date: 07/02/2019 CLINICAL DATA:  74 year old male with shortness of breath. EXAM: PORTABLE CHEST 1 VIEW COMPARISON:  Chest radiograph dated 06/25/2019. FINDINGS: Feeding tube extends below the diaphragm. Bilateral confluent airspace opacities may represent edema or multifocal pneumonia or combination. Probable small bilateral pleural effusions. No pneumothorax. Stable cardiac silhouette. No acute osseous pathology. IMPRESSION: Bilateral confluent densities may represent pneumonia or edema. Clinical correlation and follow-up recommended. Electronically Signed   By: Anner Crete M.D.   On: 07/02/2019 20:48     Scheduled Meds: .  stroke: mapping our early stages of recovery book   Does not apply Once  . aspirin  324 mg Per Tube Daily  . atorvastatin  80 mg Per Tube q1800  . carvedilol  3.125 mg Per Tube BID WC  . fluconazole  100 mg Per Tube Daily  . heparin  5,000 Units Subcutaneous Q8H   Continuous Infusions: . dextrose 40 mL/hr at 07/02/19 0030     LOS: 15 days    Time spent: 25 minutes spent in the coordination of care today.    Jonnie Finner, DO [facility]  If 7PM-7AM, please contact  night-coverage [email or web address] 07/03/2019, 2:59 PM

## 2019-07-03 NOTE — Progress Notes (Signed)
RT assessed patient for need of NT suction. Upon auscultation, patient does not sound bad. Patient O2 sats 100%. Patient in no distress. RT expressed with daughter what signs to look for in case patient needed to be suctioned. RT will also reassess patient later.

## 2019-07-03 NOTE — Progress Notes (Signed)
Attempt to call-back pt point of care Bonita Quin. No answer, no voicemail initiated to leave a message.

## 2019-07-04 LAB — RENAL FUNCTION PANEL
Albumin: 2.1 g/dL — ABNORMAL LOW (ref 3.5–5.0)
Anion gap: 11 (ref 5–15)
BUN: 18 mg/dL (ref 8–23)
CO2: 26 mmol/L (ref 22–32)
Calcium: 8.1 mg/dL — ABNORMAL LOW (ref 8.9–10.3)
Chloride: 102 mmol/L (ref 98–111)
Creatinine, Ser: 1.1 mg/dL (ref 0.61–1.24)
GFR calc Af Amer: >60 mL/min (ref >60)
GFR calc non Af Amer: >60 mL/min (ref >60)
Glucose, Bld: 100 mg/dL — ABNORMAL HIGH (ref 70–99)
Phosphorus: 4 mg/dL (ref 2.5–4.6)
Potassium: 3.5 mmol/L (ref 3.5–5.1)
Sodium: 139 mmol/L (ref 135–145)

## 2019-07-04 LAB — GLUCOSE, CAPILLARY
Glucose-Capillary: 93 mg/dL (ref 70–99)
Glucose-Capillary: 95 mg/dL (ref 70–99)
Glucose-Capillary: 98 mg/dL (ref 70–99)
Glucose-Capillary: 99 mg/dL (ref 70–99)
Glucose-Capillary: 90 mg/dL (ref 70–99)

## 2019-07-04 LAB — CBC WITH DIFFERENTIAL/PLATELET
Abs Immature Granulocytes: 0.07 10*3/uL (ref 0.00–0.07)
Basophils Absolute: 0.1 10*3/uL (ref 0.0–0.1)
Basophils Relative: 1 %
Eosinophils Absolute: 0.2 10*3/uL (ref 0.0–0.5)
Eosinophils Relative: 2 %
HCT: 40.7 % (ref 39.0–52.0)
Hemoglobin: 13.1 g/dL (ref 13.0–17.0)
Immature Granulocytes: 1 %
Lymphocytes Relative: 10 %
Lymphs Abs: 1.3 10*3/uL (ref 0.7–4.0)
MCH: 31 pg (ref 26.0–34.0)
MCHC: 32.2 g/dL (ref 30.0–36.0)
MCV: 96.4 fL (ref 80.0–100.0)
Monocytes Absolute: 1.5 10*3/uL — ABNORMAL HIGH (ref 0.1–1.0)
Monocytes Relative: 11 %
Neutro Abs: 10.1 10*3/uL — ABNORMAL HIGH (ref 1.7–7.7)
Neutrophils Relative %: 75 %
Platelets: 262 10*3/uL (ref 150–400)
RBC: 4.22 MIL/uL (ref 4.22–5.81)
RDW: 13.4 % (ref 11.5–15.5)
WBC: 13.2 10*3/uL — ABNORMAL HIGH (ref 4.0–10.5)
nRBC: 0 % (ref 0.0–0.2)

## 2019-07-04 NOTE — Progress Notes (Signed)
Spoke with Levi Pruitt this am and updated on pt status. She stated that daughter may not come in today and wanted to know if she doesn't come in would it be ok for her to come. Informed 1 visitor a day, so if daughter is not coming pt may still have 1 visitor. Levi Pruitt thanked [place] for our time, call disconnected.

## 2019-07-04 NOTE — Progress Notes (Addendum)
[name]  PROGRESS NOTE    Levi Pruitt  [id number] DOB: 1945/08/09 DOA: 06/18/2019 PCP: Patient, No Pcp Per   Brief Narrative:   73 white male-known history of obesity BMI 32 presented code stroke 06/18/2019-CT = large acute PCA infarct EKG = ST elevation MI with reciprocal changes-patient started on heparin Cardiology and neurology consulted Because of dense hemiparesis as well as prior advanced care directives stating he would not want artificial nutrition, palliative care was consulted He is currently being treated for probable aspiration pneumonia in addition to volume depletion secondary to inadequate p.o. intake  07/04/19:Will get PEG 07/06/19.More alert today. More interactive. Denies complaints today. Wean O2 as able (sats 100% on 6L). White count coming back down and he's afebrile. Continue current Tx.    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 - NPO d/t dysphagia, to have PEG placed - per neuro: ASA 325 and plavix DAPT for 3 months and then ASA alone - A1c 06/19/19: 5.3 - LDL: 99, HDL: 32, Tchol: 152  Aspiration pneumonia Dysphagia - RT had to deep suction several times on 1/15 - CXR 1/14 shows worsening with right upper lobe infiltrates-azithromycin discontinued 1/17 continue ceftriaxone and transition to oral meds once PEG tube is placed - Speech therapy, palliative medicine and attending physician have all discussed with the  family and [facility] POA understands risk benefits and alternatives to PEG tube placement which will be performed likely 1/20 - Continue IV D5 40 cc/H for now and likely can give free water via PEG tube once it is placed - IR consulted for PEG; appreciate assistance - needs to be off plavix for 5 days - CIR rec'd by PT,ip rehab consulted, to follow up on Monday     - 07/03/19: likely had aspiration last night, got lasix, but still with UAT; needs suction; wean O2 as able, WBC up, but no fever; hold abx for now     - 07/04/19: respiration better today; sats ok; wean O2; no need for abx at this time.   Acute inferior STEMI - Initially heparin- troponins trended up - cardiology did not feel was a candidate for anything but medical interventions Heparin IV was discontinued - repeat echo 1/15: EF 30-35%. G3 diastolic dysfxn (essentially same as 06/19/19 echo) - Coreg held d/t hypotension and then changed to coreg CR 10mg  daily - ACEi held d/t BP -coreg3.125 mg BID     - 07/04/19: pressures are moving down as well as HR; place hold parameters on coreg  Systolic, diastolic heart failure with pulmonary edema - last lasix 1/15; monitor daily wts, I&Os  CKD stage II  mild hypernatremia mild metabolic acidosis - hypernatremia ok, monitor - acidosis resolved, monitor - Scr up after lasix, monitor for now.     - 07/04/19: Scr has returned to normal, monitor  8 mm parotid neoplasm - Outpatient follow-up  yeast intertriginous - diflucan 100mg  qday through today  DVT prophylaxis: SCDs Code Status: FULL Family  Communication: None at bedside   Disposition Plan: Needs PEG. CIR to follow up tomorrow  Consultants:   IR  PC  Antimicrobials:  . diflucan   ROS:  Denies CP, ab pain, N, V . Remainder 10-pt ROS is negative for all not previously mentioned.  Subjective: "It's ok."  Objective: Vitals:   07/04/19 0855 07/04/19 1000  07/04/19 1029 07/04/19 1246  BP:  120/73  106/66  Pulse: 63 (!) 25 64   Resp:  (!) 28 20   Temp:      TempSrc:      SpO2:  94% 98%   Weight:      Height:        Intake/Output Summary (Last 24 hours) at 07/04/2019 1444 Last data filed at 07/03/2019 2100 Gross per 24 hour  Intake --  Output 500 ml  Net -500 ml   Filed Weights   06/29/19 0500 06/30/19 0540 07/02/19 0500  Weight: 96.6 kg 95.9 kg 95 kg    Examination:  General: 74 y.o. male resting in bed in NAD Cardiovascular: RRR, +S1, S2, no m/g/r, equal pulses throughout Respiratory: CTABL, no w/r/r, normal WOB GI: BS+, NDNT, soft, NGT in place MSK: No e/c/c Neuro: alert to name, follows commands Psyc: calm/cooperative   Data Reviewed: I have personally reviewed following labs and imaging studies.  CBC: Recent Labs  Lab 06/29/19 0408 07/01/19 0253 07/02/19 0212 07/03/19 0429 07/04/19 1048  WBC 16.2* 14.9* 14.1* 19.7* 13.2*  NEUTROABS 12.7* 11.7* 11.2* 16.6* 10.1*  HGB 13.1 13.6 13.7 14.6 13.1  HCT 40.8 42.1 42.5 44.3 40.7  MCV 97.4 97.2 97.5 94.3 96.4  PLT 293 262 304 285 [id number]   Basic Metabolic Panel: Recent Labs  Lab 06/29/19 0408 07/01/19 0253 07/02/19 0212 07/03/19 0429 07/04/19 1048  NA 143 140 139 138 139  K 3.6 3.8 3.6 3.7 3.5  CL 108 105 104 101 102  CO2 25 23 22 25 26   GLUCOSE 97 94 88 108* 100*  BUN 21 17 14 15 18   CREATININE 1.18 1.16 1.00 1.25* 1.10  CALCIUM 8.1* 8.3* 8.2* 8.3* 8.1*  MG  --  1.9 1.9 1.9  --   PHOS  --  3.6 3.3 3.7 4.0   GFR: Estimated Creatinine Clearance: 68 mL/min (by C-G formula based on SCr of 1.1 mg/dL). Liver Function Tests: Recent Labs  Lab 06/28/19 0753 07/01/19 0253 07/02/19 0212 07/03/19 0429 07/04/19 1048  AST 37  --   --   --   --   ALT 38  --   --   --   --   ALKPHOS 80  --   --   --   --   BILITOT 1.1  --   --   --   --   PROT 5.3*  --   --   --   --   ALBUMIN 2.2* 2.2* 2.2* 2.4* 2.1*   No results for input(s): LIPASE, AMYLASE in the last 168  hours. No results for input(s): AMMONIA in the last 168 hours. Coagulation Profile: No results for input(s): INR, PROTIME in the last 168 hours. Cardiac Enzymes: No results for input(s): CKTOTAL, CKMB, CKMBINDEX, TROPONINI in the last 168 hours. BNP (last 3 results) No results for input(s): PROBNP in the last 8760 hours. HbA1C: No results for input(s): HGBA1C in the last 72 hours. CBG: Recent Labs  Lab 07/03/19 2020 07/03/19 2353 07/04/19 0420 07/04/19 0813 07/04/19 1217  GLUCAP 121* 92 93 95 98   Lipid Profile:  No results for input(s): CHOL, HDL, LDLCALC, TRIG, CHOLHDL, LDLDIRECT in the last 72 hours. Thyroid Function Tests: No results for input(s): TSH, T4TOTAL, FREET4, T3FREE, THYROIDAB in the last 72 hours. Anemia Panel: No results for input(s): VITAMINB12, FOLATE, FERRITIN, TIBC, IRON, RETICCTPCT in the last 72 hours. Sepsis Labs: No results for input(s): PROCALCITON, LATICACIDVEN in the last 168 hours.  No results found for this or any previous visit (from the past 240 hour(s)).    Radiology Studies: DG CHEST PORT 1 VIEW  Result Date: 07/02/2019 CLINICAL DATA:  74 year old male with shortness of breath. EXAM: PORTABLE CHEST 1 VIEW COMPARISON:  Chest radiograph dated 06/25/2019. FINDINGS: Feeding tube extends below the diaphragm. Bilateral confluent airspace opacities may represent edema or multifocal pneumonia or combination. Probable small bilateral pleural effusions. No pneumothorax. Stable cardiac silhouette. No acute osseous pathology. IMPRESSION: Bilateral confluent densities may represent pneumonia or edema. Clinical correlation and follow-up recommended. Electronically Signed   By: Anner Crete M.D.   On: 07/02/2019 20:48     Scheduled Meds: .  stroke: mapping our early stages of recovery book   Does not apply Once  . aspirin  324 mg Per Tube Daily  . atorvastatin  80 mg Per Tube q1800  . carvedilol  3.125 mg Per Tube BID WC  . fluconazole  100 mg Per Tube  Daily  . heparin  5,000 Units Subcutaneous Q8H   Continuous Infusions: . dextrose 40 mL/hr at 07/02/19 0030     LOS: 16 days    Time spent: 25 minutes spent in the coordination of care today.    Jonnie Finner, DO [facility]  If 7PM-7AM, please contact night-coverage [email or web address] 07/04/2019, 2:44 PM

## 2019-07-05 LAB — CBC WITH DIFFERENTIAL/PLATELET
Abs Immature Granulocytes: 0.08 10*3/uL — ABNORMAL HIGH (ref 0.00–0.07)
Basophils Absolute: 0.1 10*3/uL (ref 0.0–0.1)
Basophils Relative: 1 %
Eosinophils Absolute: 0.3 10*3/uL (ref 0.0–0.5)
Eosinophils Relative: 2 %
HCT: 43.9 % (ref 39.0–52.0)
Hemoglobin: 14 g/dL (ref 13.0–17.0)
Immature Granulocytes: 1 %
Lymphocytes Relative: 11 %
Lymphs Abs: 1.7 10*3/uL (ref 0.7–4.0)
MCH: 30.6 pg (ref 26.0–34.0)
MCHC: 31.9 g/dL (ref 30.0–36.0)
MCV: 96.1 fL (ref 80.0–100.0)
Monocytes Absolute: 1.7 10*3/uL — ABNORMAL HIGH (ref 0.1–1.0)
Monocytes Relative: 11 %
Neutro Abs: 11.3 10*3/uL — ABNORMAL HIGH (ref 1.7–7.7)
Neutrophils Relative %: 74 %
Platelets: 297 10*3/uL (ref 150–400)
RBC: 4.57 MIL/uL (ref 4.22–5.81)
RDW: 13.6 % (ref 11.5–15.5)
WBC: 15 10*3/uL — ABNORMAL HIGH (ref 4.0–10.5)
nRBC: 0 % (ref 0.0–0.2)

## 2019-07-05 LAB — RENAL FUNCTION PANEL
Albumin: 2.2 g/dL — ABNORMAL LOW (ref 3.5–5.0)
Anion gap: 13 (ref 5–15)
BUN: 17 mg/dL (ref 8–23)
CO2: 26 mmol/L (ref 22–32)
Calcium: 8.4 mg/dL — ABNORMAL LOW (ref 8.9–10.3)
Chloride: 100 mmol/L (ref 98–111)
Creatinine, Ser: 1.1 mg/dL (ref 0.61–1.24)
GFR calc Af Amer: >60 mL/min (ref >60)
GFR calc non Af Amer: >60 mL/min (ref >60)
Glucose, Bld: 97 mg/dL (ref 70–99)
Phosphorus: 3.8 mg/dL (ref 2.5–4.6)
Potassium: 3.3 mmol/L — ABNORMAL LOW (ref 3.5–5.1)
Sodium: 139 mmol/L (ref 135–145)

## 2019-07-05 LAB — GLUCOSE, CAPILLARY
Glucose-Capillary: 108 mg/dL — ABNORMAL HIGH (ref 70–99)
Glucose-Capillary: 98 mg/dL (ref 70–99)
Glucose-Capillary: 108 mg/dL — ABNORMAL HIGH (ref 70–99)
Glucose-Capillary: 112 mg/dL — ABNORMAL HIGH (ref 70–99)
Glucose-Capillary: 119 mg/dL — ABNORMAL HIGH (ref 70–99)
Glucose-Capillary: 111 mg/dL — ABNORMAL HIGH (ref 70–99)

## 2019-07-05 LAB — MAGNESIUM: Magnesium: 2.1 mg/dL (ref 1.7–2.4)

## 2019-07-05 MED ORDER — CEFAZOLIN SODIUM-DEXTROSE 2-4 GM/100ML-% IV SOLN
2.0000 g | INTRAVENOUS | Status: AC
  Administered 2019-07-05: 2 g via INTRAVENOUS
  Filled 2019-07-05: qty 100

## 2019-07-05 MED ORDER — POTASSIUM CHLORIDE 10 MEQ/100ML IV SOLN
10.0000 meq | INTRAVENOUS | Status: AC
  Administered 2019-07-05: 10 meq via INTRAVENOUS
  Filled 2019-07-05 (×2): qty 100

## 2019-07-05 MED ORDER — POTASSIUM CHLORIDE 10 MEQ/100ML IV SOLN
10.0000 meq | Freq: Once | INTRAVENOUS | Status: AC
  Administered 2019-07-05: 10 meq via INTRAVENOUS

## 2019-07-05 MED ORDER — HEPARIN SODIUM (PORCINE) 5000 UNIT/ML IJ SOLN
5000.0000 [IU] | Freq: Three times a day (TID) | INTRAMUSCULAR | Status: DC
  Administered 2019-07-07 (×2): 5000 [IU] via SUBCUTANEOUS
  Filled 2019-07-05 (×2): qty 1

## 2019-07-05 MED ORDER — POTASSIUM CHLORIDE 10 MEQ/100ML IV SOLN
10.0000 meq | INTRAVENOUS | Status: AC
  Administered 2019-07-05 (×2): 10 meq via INTRAVENOUS
  Filled 2019-07-05 (×2): qty 100

## 2019-07-05 NOTE — Progress Notes (Signed)
Physical Therapy Treatment Patient Details Name: Levi Pruitt MRN: [id number] DOB: 08-Dec-1945 Today's Date: 07/05/2019    History of Present Illness Pt is a 74 yo male admitted 06/18/19 with a stroke due to embolism of L PCA on 06/18/19. 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 Since admission, pt has been dx with possible pneumonia, acute on chronic CHF, AKI, HLD, and inferior wall STEMI. Plan for PEG placement 1/26. No PMH on file.    PT Comments    Pt remains to demo dense R hemiplegia however is more interactive with PT, improved command follow, and improved sitting EOB balance. Pt remains to require totalAX2 for OOB mobility/lateral scoot to chair. Instructed RN staff to use maxi lift to transfer pt back to bed.  Cont to recommend CIR upon d/c. Acute pt to cont follow.   Follow Up Recommendations  CIR;Supervision/Assistance - 24 hour     Equipment Recommendations  (TBD)    Recommendations for Other Services Rehab consult     Precautions / Restrictions Precautions Precautions: Fall Precaution Comments: dense R hemiparesis with inattention; NGT; bowel/bladder incontinence Restrictions Weight Bearing Restrictions: No    Mobility  Bed Mobility Overal bed mobility: Needs Assistance Bed Mobility: Rolling;Sidelying to Sit Rolling: Max assist;+2 for physical assistance Sidelying to sit: Max assist;Total assist;+2 for physical assistance Supine to sit: Max assist;+2 for physical assistance     General bed mobility comments: patient able to assist with rolling using L UE/LE given cueing; overall requiring max-total assist +2 due to sequencing, motor planning and initation (as well as weakness and impaired balance)   Transfers Overall transfer level: Needs assistance Equipment used: 2 person hand held assist(3 person) Transfers:  Lateral/Scoot Transfers          Lateral/Scoot Transfers: Total assist;+2 physical assistance;+2 safety/equipment(+ 3 assist ) General transfer comment: attempted to transfer to the R however unsuccessful, Pt transfered with bed pad underneath patient towards the L  via lateral scoot to chair, pt with minimal active participation due to impaired sequencing and R sided flaccidity  Ambulation/Gait                 Stairs             Wheelchair Mobility    Modified Rankin (Stroke Patients Only) Modified Rankin (Stroke Patients Only) Pre-Morbid Rankin Score: No symptoms Modified Rankin: Severe disability     Balance Overall balance assessment: Needs assistance Sitting-balance support: No upper extremity supported;Feet supported Sitting balance-Leahy Scale: Poor Sitting balance - Comments: Able to maintain static sitting for brief moments without support, following cues to lean towards L-side Postural control: Right lateral lean                                  Cognition Arousal/Alertness: Awake/alert Behavior During Therapy: Flat affect Overall Cognitive Status: Impaired/Different from baseline Area of Impairment: Attention;Following commands;Safety/judgement;Awareness;Problem solving;Orientation;Memory                 Orientation Level: Disoriented to;Place;Time;Situation Current Attention Level: Focused;Sustained Memory: Decreased short-term memory Following Commands: Follows one step commands inconsistently;Follows one step commands with increased time Safety/Judgement: Decreased awareness of safety;Decreased awareness of deficits Awareness: Intellectual Problem Solving: Slow processing;Difficulty sequencing;Requires verbal cues;Requires tactile cues;Decreased initiation General Comments: patient with severe R sided neglect however will briefly attend to  R side with max tactile and directional cues to turn head to the R. Pt remains to have  garbled speech but is attempting to communicate more      Exercises Other Exercises Other Exercises: PROM of R UE from hand to shoulder, no active motion noted    General Comments General comments (skin integrity, edema, etc.): VSS      Pertinent Vitals/Pain Pain Assessment: Faces Faces Pain Scale: No hurt    Home Living                      Prior Function            PT Goals (current goals can now be found in the care plan section) Acute Rehab PT Goals Potential to Achieve Goals: Fair Progress towards PT goals: Progressing toward goals    Frequency    Min 4X/week      PT Plan Current plan remains appropriate    Co-evaluation PT/OT/SLP Co-Evaluation/Treatment: Yes Reason for Co-Treatment: Complexity of the patient's impairments (multi-system involvement) PT goals addressed during session: Mobility/safety with mobility OT goals addressed during session: ADL's and self-care      AM-PAC PT "6 Clicks" Mobility   Outcome Measure  Help needed turning from your back to your side while in a flat bed without using bedrails?: A Lot Help needed moving from lying on your back to sitting on the side of a flat bed without using bedrails?: Total Help needed moving to and from a bed to a chair (including a wheelchair)?: Total Help needed standing up from a chair using your arms (e.g., wheelchair or bedside chair)?: Total Help needed to walk in hospital room?: Total Help needed climbing 3-5 steps with a railing? : Total 6 Click Score: 7    End of Session Equipment Utilized During Treatment: Oxygen Activity Tolerance: Patient tolerated treatment well;Patient limited by fatigue Patient left: with call bell/phone within reach;in chair;with chair alarm set Nurse Communication: Mobility status;Need for lift equipment PT Visit Diagnosis: Muscle weakness (generalized) (M62.81);Difficulty in walking, not elsewhere classified (R26.2);Unsteadiness on feet (R26.81);Other  symptoms and signs involving the nervous system (R29.898);Hemiplegia and hemiparesis Hemiplegia - Right/Left: Right Hemiplegia - dominant/non-dominant: Dominant Hemiplegia - caused by: Cerebral infarction     Time: [id number] PT Time Calculation (min) (ACUTE ONLY): 35 min  Charges:  $Neuromuscular Re-education: 8-22 mins                     Kittie Plater, PT, DPT Acute Rehabilitation Services Pager #: [phone] Office #: [phone]    Berline Lopes 07/05/2019, 10:31 AM

## 2019-07-05 NOTE — Progress Notes (Addendum)
Inpatient Rehab Admissions Coordinator:   Met with pt and daughter at bedside, family on conference call to discuss CIR.  They are all hopeful admission pending PEG placement tomorrow.  We discussed likely need for physical assist at completion of CIR program. Family asked appropriate questions about expected progress and decision making, DME, etc.  Will f/u with them tomorrow for possible admission pending bed availability and medical readiness.   Shann Medal, PT, DPT Admissions Coordinator [phone] 07/05/19  3:25 PM

## 2019-07-05 NOTE — Progress Notes (Signed)
Occupational Therapy Treatment Patient Details Name: Levi Pruitt MRN: [id number] DOB: Jun 22, 1945 Today's Date: 07/05/2019    History of present illness Pt is a 74 yo male admitted 06/18/19 with a stroke due to embolism of L PCA on 06/18/19. 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 Since admission, pt has been dx with possible pneumonia, acute on chronic CHF, AKI, HLD, and inferior wall STEMI. Plan for PEG placement 1/26. No PMH on file.   OT comments  Patient completing bed mobility with max assist +2, EOB sitting with mod to at best min guard for safety with R lateral lean, and lateral scoot transfers towards L side with total assist +2-3 for safety.  Attempted transfer to R side due to improved sitting balance, but patient unable to initiate and engage in transfer.  Once seated in recliner able to complete hand washing with max assist and brushing hair using L hand with min assist. Will follow acutely.    Follow Up Recommendations  CIR;Supervision/Assistance - 24 hour    Equipment Recommendations  Other (comment)(TBD at next venue of care)    Recommendations for Other Services Rehab consult    Precautions / Restrictions Precautions Precautions: Fall Precaution Comments: dense R hemiparesis with inattention; NGT; bowel/bladder incontinence Restrictions Weight Bearing Restrictions: No       Mobility Bed Mobility Overal bed mobility: Needs Assistance Bed Mobility: Rolling;Sidelying to Sit Rolling: Max assist;+2 for physical assistance Sidelying to sit: Max assist;Total assist;+2 for physical assistance       General bed mobility comments: patient able to assist with rolling using L UE/LE given cueing; overall requiring max-total assist +2 due to sequencing, motor planning and initation (as well as weakness and impaired balance)    Transfers Overall transfer level: Needs assistance   Transfers: Lateral/Scoot Transfers          Lateral/Scoot Transfers: Total assist;+2 physical assistance;+2 safety/equipment(+ 3 assist ) General transfer comment: lateral scoot from EOB to recliner towards L side with total assist +3 (after attempt to R side and unable to successfully complete)    Balance Overall balance assessment: Needs assistance Sitting-balance support: No upper extremity supported;Feet supported Sitting balance-Leahy Scale: Poor Sitting balance - Comments: Able to maintain static sitting for brief moments without support, following cues to lean towards L-side Postural control: Right lateral lean                                 ADL either performed or assessed with clinical judgement   ADL Overall ADL's : Needs assistance/impaired     Grooming: Maximal assistance;Sitting;Wash/dry hands;Brushing hair Grooming Details (indicate cue type and reason): seated in recliner, able to wash hands with inital hand over hand assist but sustained attention to wash R UE partially given multimodal cueing; min assist to comb hair with L UE for thoroughness                              Functional mobility during ADLs: Total assistance;+2 for physical assistance;+2 for safety/equipment General ADL Comments: requires max-total assist for all self care     Vision       Perception     Praxis      Cognition Arousal/Alertness: Awake/alert Behavior During Therapy: Flat affect Overall Cognitive Status:  Impaired/Different from baseline Area of Impairment: Attention;Following commands;Safety/judgement;Awareness;Problem solving;Orientation;Memory                 Orientation Level: Disoriented to;Place;Time;Situation Current Attention Level: Focused;Sustained Memory: Decreased short-term memory Following Commands: Follows one step commands inconsistently;Follows one step commands with  increased time Safety/Judgement: Decreased awareness of safety;Decreased awareness of deficits Awareness: Intellectual Problem Solving: Slow processing;Difficulty sequencing;Requires verbal cues;Requires tactile cues;Decreased initiation General Comments: patient following simple commands with increased time but inconsistent, poor awareness to deficits and only oriented to self        Exercises Exercises: Other exercises Other Exercises Other Exercises: PROM of R UE from hand to shoulder, no active motion noted   Shoulder Instructions       General Comments VSS, pt on .5L      Pertinent Vitals/ Pain       Pain Assessment: Faces Faces Pain Scale: No hurt  Home Living                                          Prior Functioning/Environment              Frequency  Min 2X/week        Progress Toward Goals  OT Goals(current goals can now be found in the care plan section)  Progress towards OT goals: Progressing toward goals  Acute Rehab OT Goals OT Goal Formulation: Patient unable to participate in goal setting Time For Goal Achievement: 07/08/19 Potential to Achieve Goals: [place] Discharge plan remains appropriate;Frequency remains appropriate    Co-evaluation    PT/OT/SLP Co-Evaluation/Treatment: Yes Reason for Co-Treatment: Complexity of the patient's impairments (multi-system involvement)   OT goals addressed during session: ADL's and self-care      AM-PAC OT "6 Clicks" Daily Activity     Outcome Measure   Help from another person eating meals?: Total Help from another person taking care of personal grooming?: Total Help from another person toileting, which includes using toliet, bedpan, or urinal?: Total Help from another person bathing (including washing, rinsing, drying)?: Total Help from another person to put on and taking off regular upper body clothing?: Total Help from another person to put on and taking off regular lower  body clothing?: Total 6 Click Score: 6    End of Session Equipment Utilized During Treatment: Oxygen;Other (comment);Gait belt  OT Visit Diagnosis: Other abnormalities of gait and mobility (R26.89);Muscle weakness (generalized) (M62.81);Hemiplegia and hemiparesis;Cognitive communication deficit (R41.841);Other symptoms and signs involving cognitive function Symptoms and signs involving cognitive functions: Cerebral infarction Hemiplegia - Right/Left: Right Hemiplegia - dominant/non-dominant: Dominant Hemiplegia - caused by: Cerebral infarction   Activity Tolerance Patient tolerated treatment well   Patient Left with call bell/phone within reach;in chair;with chair alarm set   Nurse Communication Mobility status;Need for lift equipment        Time: [phone] OT Time Calculation (min): 35 min  Charges: OT General Charges $OT Visit: 1 Visit OT Treatments $Self Care/Home Management : 8-22 mins  Jolaine Artist, OT Acute Rehabilitation Services Pager [phone] Office [phone]     Delight Stare 07/05/2019, 9:34 AM

## 2019-07-05 NOTE — Progress Notes (Signed)
  Speech Language Pathology Treatment: Dysphagia;Cognitive-Linquistic  Patient Details Name: Levi Pruitt MRN: [id number] DOB: [birth year] Today's Date: 07/05/2019 Time: [id number] SLP Time Calculation (min) (ACUTE ONLY): 30 min  Assessment / Plan / Recommendation Clinical Impression  Pt seen at bedside for skilled ST intervention targeting goals for diet tolerance and communicative effectiveness. Daughter present today. Pt appeared fatigued - Daughter reports pt was up in the recliner for several hours today.   DYSPHAGIA: Oral care was completed with suction, after which, pt was given small boluses of magic cup x2. Pt exhibited cough response, and continued to cough for several minutes. PO presentations were discontinued due to aspiration risk. Pt is having PEG placed tomorrow (Tuesday 07/06/19). Daughter and pt were educated regarding the goal for PEG placement is to provide adequate nutrition/hydration safely. Pt will be able to continue to have PO intake for oral comfort and dysphagia treatment session.  APHASIA: Pt demonstrating language of confusion today, possibly related to fatigue. Speech was difficult to understand. Pt able to follow 1-step commands with multimodal cues (verbal, visual and tactile). He was unable to name objects in his bed, but was able to complete automatic sequences with mod verbal assist.   Will continue ST acutely for speech, language, and swallowing. Recommend continued ST intervention at next venue given level of severity and previous level of function.    HPI HPI:  74 year old Caucasian male, obese, with no other documented past medical history. Patient is not able to provide and history.  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      SLP Plan  Continue with current  plan of care       Recommendations  Diet recommendations: Dysphagia 1 (puree);Honey-thick liquid Liquids provided via: Cup;Teaspoon Medication Administration: Crushed with puree Supervision: Full supervision/cueing for compensatory strategies Compensations: Slow rate;Small sips/bites;Effortful swallow;Minimize environmental distractions;Monitor for anterior loss Postural Changes and/or Swallow Maneuvers: Seated upright 90 degrees;Upright 30-60 min after meal                Oral Care Recommendations: Oral care QID Follow up Recommendations: Inpatient Rehab SLP Visit Diagnosis: Dysphagia, oropharyngeal phase (R13.12);Aphasia (R47.01) Plan: Continue with current plan of care       GO               Yochanan Eddleman B. Quentin Ore, [facility], [place] Speech Language Pathologist Office: [phone] Pager: [phone]   Shonna Chock 07/05/2019, 3:34 PM

## 2019-07-05 NOTE — Progress Notes (Signed)
[name]  PROGRESS NOTE    Tirrell Luellen  [id number] DOB: [birth year] DOA: 06/18/2019 PCP: Patient, No Pcp Per   Brief Narrative:   74 white male-known history of obesity BMI 32 presented code stroke 06/18/2019-CT = large acute PCA infarct EKG = ST elevation MI with reciprocal changes-patient started on heparin Cardiology and neurology consulted Because of dense hemiparesis as well as prior advanced care directives stating he would not want artificial nutrition, palliative care was consulted He is currently being treated for probable aspiration pneumonia in addition to volume depletion secondary to inadequate p.o. intake  07/05/19:Will get PEG 07/06/19. Up an in chair today. Looks better. Weaning O2. Now on 1L and tolerating. PT/OT rec CIR. Awaiting for final review. Looks positive.    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) Active Problems:   Abnormal ECG   Acute ST elevation myocardial infarction (STEMI) of inferior wall (HCC)   Acute ischemic stroke (HCC)   Parotid mass   AKI (acute kidney injury) ([place])   ST elevation myocardial infarction (STEMI) (HCC)   LV dysfunction   Palliative care by specialist   DNR (do not resuscitate) discussion   Dysphagia   Weakness generalized  Large acute PCA infarct - Significant deficits with dense hemiparesis/plegia on the left side - NPO d/t dysphagia, to have PEG placed - per neuro: ASA 325 and plavix DAPT for 3 months and then ASA alone - A1c 06/19/19: 5.3 - LDL: 99, HDL: 32, Tchol: 152  Aspiration pneumonia Dysphagia - RT had to deep suction several times on 1/15 - CXR 1/14 shows worsening with right upper lobe infiltrates-azithromycin discontinued 1/17 continue ceftriaxone and transition to oral meds once PEG tube is placed - Speech therapy, palliative medicine and attending physician have all discussed with the family and [facility] POA understands risk  benefits and alternatives to PEG tube placement which will be performed likely 1/20 - Continue IV D5 40 cc/H for now and likely can give free water via PEG tube once it is placed - IR consulted for PEG; appreciate assistance - needs to be off plavix for 5 days - CIR rec'd by PT,ip rehab consulted, to follow up on Monday - 07/03/19: likely had aspiration last night, got lasix, but still with UAT; needs suction; wean O2 as able, WBC up, but no fever; hold abx for now     - 07/04/19: respiration better today; sats ok; wean O2; no need for abx at this time.     - 07/05/19: PEG tomorrow. No changes  Acute inferior STEMI - Initially heparin- troponins trended up - cardiology did not feel was a candidate for anything but medical interventions Heparin IV was discontinued - repeat echo 1/15: EF 30-35%. G3 diastolic dysfxn (essentially same as 06/19/19 echo) - Coreg held d/t hypotension and then changed to coreg CR 10mg  daily - ACEi held d/t BP -coreg3.125 mg BID     - 07/04/19: pressures are moving down as well as HR; place hold parameters on coreg     - 07/05/19: BP a little better today, continue current regimen  Systolic, diastolic heart failure with pulmonary edema - last lasix 1/15; monitor daily wts, I&Os  CKD stage II  mild hypernatremia mild metabolic acidosis - hypernatremia ok, monitor - acidosis resolved, monitor -Scr up after lasix, monitor for now.     - 07/05/19: resolved  8 mm parotid neoplasm - Outpatient follow-up  yeast intertriginous - completed diflucan; improved.  DVT prophylaxis: SCDs Code Status: FULL Family Communication: None at bedside   Disposition Plan: Needs PEG. CIR follow up?  Consultants:   IR  PC  ROS:  Denies dyspnea, N, V, CP . Remainder 10-pt ROS is negative for all not previously mentioned.  Subjective: "I think it's ok today."  Objective: Vitals:   07/04/19 2300  07/05/19 0729 07/05/19 1000 07/05/19 1150  BP:  115/71 119/78 115/62  Pulse:  64 (!) 58 (!) 57  Resp:  19 12 13   Temp: 98.2 F (36.8 C) 98.1 F (36.7 C)  97.7 F (36.5 C)  TempSrc: Oral Axillary  Oral  SpO2:  95% 94% (!) 89%  Weight:      Height:        Intake/Output Summary (Last 24 hours) at 07/05/2019 1211 Last data filed at 07/05/2019 0500 Gross per 24 hour  Intake 689.45 ml  Output 375 ml  Net 314.45 ml   Filed Weights   06/29/19 0500 06/30/19 0540 07/02/19 0500  Weight: 96.6 kg 95.9 kg 95 kg    Examination:  General: 74 y.o. male resting in chair in NAD Cardiovascular: RRR, +S1, S2, no m/g/r Respiratory: CTABL, no w/r/r, normal WOB on 1L [place] GI: BS+, NDNT, no masses noted, no organomegaly noted, NGT in place MSK: No e/c/c Neuro:  Alert to name, follows commands Psyc: calm/cooperative   Data Reviewed: I have personally reviewed following labs and imaging studies.  CBC: Recent Labs  Lab 07/01/19 0253 07/02/19 0212 07/03/19 0429 07/04/19 1048 07/05/19 0341  WBC 14.9* 14.1* 19.7* 13.2* 15.0*  NEUTROABS 11.7* 11.2* 16.6* 10.1* 11.3*  HGB 13.6 13.7 14.6 13.1 14.0  HCT 42.1 42.5 44.3 40.7 43.9  MCV 97.2 97.5 94.3 96.4 96.1  PLT 262 304 285 262 [id number]   Basic Metabolic Panel: Recent Labs  Lab 07/01/19 0253 07/02/19 0212 07/03/19 0429 07/04/19 1048 07/05/19 0341  NA 140 139 138 139 139  K 3.8 3.6 3.7 3.5 3.3*  CL 105 104 101 102 100  CO2 23 22 25 26 26   GLUCOSE 94 88 108* 100* 97  BUN 17 14 15 18 17   CREATININE 1.16 1.00 1.25* 1.10 1.10  CALCIUM 8.3* 8.2* 8.3* 8.1* 8.4*  MG 1.9 1.9 1.9  --  2.1  PHOS 3.6 3.3 3.7 4.0 3.8   GFR: Estimated Creatinine Clearance: 68 mL/min (by C-G formula based on SCr of 1.1 mg/dL). Liver Function Tests: Recent Labs  Lab 07/01/19 0253 07/02/19 0212 07/03/19 0429 07/04/19 1048 07/05/19 0341  ALBUMIN 2.2* 2.2* 2.4* 2.1* 2.2*   No results for input(s): LIPASE, AMYLASE in the last 168 hours. No results for input(s):  AMMONIA in the last 168 hours. Coagulation Profile: No results for input(s): INR, PROTIME in the last 168 hours. Cardiac Enzymes: No results for input(s): CKTOTAL, CKMB, CKMBINDEX, TROPONINI in the last 168 hours. BNP (last 3 results) No results for input(s): PROBNP in the last 8760 hours. HbA1C: No results for input(s): HGBA1C in the last 72 hours. CBG: Recent Labs  Lab 07/04/19 1217 07/04/19 1959 07/04/19 2303 07/05/19 0437 07/05/19 0727  GLUCAP 98 99 90 108* 98   Lipid Profile: No results for input(s): CHOL, HDL, LDLCALC, TRIG, CHOLHDL, LDLDIRECT in the last 72 hours. Thyroid Function Tests: No results for input(s): TSH, T4TOTAL, FREET4, T3FREE, THYROIDAB in the last 72 hours. Anemia Panel: No results for input(s): VITAMINB12, FOLATE, FERRITIN, TIBC, IRON, RETICCTPCT in the last 72 hours. Sepsis Labs: No results for input(s): PROCALCITON, LATICACIDVEN in the last 168  hours.  No results found for this or any previous visit (from the past 240 hour(s)).    Radiology Studies: No results found.   Scheduled Meds: .  stroke: mapping our early stages of recovery book   Does not apply Once  . aspirin  324 mg Per Tube Daily  . atorvastatin  80 mg Per Tube q1800  . carvedilol  3.125 mg Per Tube BID WC  . fluconazole  100 mg Per Tube Daily  . [START ON 07/07/2019] heparin  5,000 Units Subcutaneous Q8H   Continuous Infusions: . [START ON 07/06/2019]  ceFAZolin (ANCEF) IV    . dextrose 40 mL/hr at 07/02/19 0030  . potassium chloride       LOS: 17 days    Time spent: 25 minutes spent in the coordination of care today.    Jonnie Finner, DO [facility]  If 7PM-7AM, please contact night-coverage [email or web address] 07/05/2019, 12:11 PM

## 2019-07-06 ENCOUNTER — Inpatient Hospital Stay (HOSPITAL_COMMUNITY): Payer: Medicare Other

## 2019-07-06 HISTORY — PX: IR GASTROSTOMY TUBE MOD SED: IMG625

## 2019-07-06 LAB — RENAL FUNCTION PANEL
Albumin: 2.3 g/dL — ABNORMAL LOW (ref 3.5–5.0)
Anion gap: 11 (ref 5–15)
BUN: 14 mg/dL (ref 8–23)
CO2: 23 mmol/L (ref 22–32)
Calcium: 8.4 mg/dL — ABNORMAL LOW (ref 8.9–10.3)
Chloride: 104 mmol/L (ref 98–111)
Creatinine, Ser: 1.02 mg/dL (ref 0.61–1.24)
GFR calc Af Amer: >60 mL/min (ref >60)
GFR calc non Af Amer: >60 mL/min (ref >60)
Glucose, Bld: 107 mg/dL — ABNORMAL HIGH (ref 70–99)
Phosphorus: 3.8 mg/dL (ref 2.5–4.6)
Potassium: 3.8 mmol/L (ref 3.5–5.1)
Sodium: 138 mmol/L (ref 135–145)

## 2019-07-06 LAB — GLUCOSE, CAPILLARY
Glucose-Capillary: 105 mg/dL — ABNORMAL HIGH (ref 70–99)
Glucose-Capillary: 104 mg/dL — ABNORMAL HIGH (ref 70–99)
Glucose-Capillary: 101 mg/dL — ABNORMAL HIGH (ref 70–99)
Glucose-Capillary: 140 mg/dL — ABNORMAL HIGH (ref 70–99)
Glucose-Capillary: 73 mg/dL (ref 70–99)
Glucose-Capillary: 92 mg/dL (ref 70–99)

## 2019-07-06 LAB — PROTIME-INR
INR: 1 (ref 0.8–1.2)
Prothrombin Time: 13.4 seconds (ref 11.4–15.2)

## 2019-07-06 LAB — CBC
HCT: 43.2 % (ref 39.0–52.0)
Hemoglobin: 14.2 g/dL (ref 13.0–17.0)
MCH: 30.9 pg (ref 26.0–34.0)
MCHC: 32.9 g/dL (ref 30.0–36.0)
MCV: 93.9 fL (ref 80.0–100.0)
Platelets: 279 10*3/uL (ref 150–400)
RBC: 4.6 MIL/uL (ref 4.22–5.81)
RDW: 13.3 % (ref 11.5–15.5)
WBC: 14.8 10*3/uL — ABNORMAL HIGH (ref 4.0–10.5)
nRBC: 0 % (ref 0.0–0.2)

## 2019-07-06 MED ORDER — CEFAZOLIN SODIUM-DEXTROSE 2-4 GM/100ML-% IV SOLN: 2.0000 g | Freq: Once | INTRAVENOUS | Status: AC

## 2019-07-06 MED ORDER — MIDAZOLAM HCL 2 MG/2ML IJ SOLN
INTRAMUSCULAR | Status: AC
  Filled 2019-07-06: qty 4

## 2019-07-06 MED ORDER — LORAZEPAM 2 MG/ML IJ SOLN
0.5000 mg | Freq: Once | INTRAMUSCULAR | Status: AC
  Administered 2019-07-06: 0.5 mg via INTRAVENOUS
  Filled 2019-07-06: qty 1

## 2019-07-06 MED ORDER — GLUCAGON HCL (RDNA) 1 MG IJ SOLR
INTRAMUSCULAR | Status: AC | PRN
  Administered 2019-07-06: 1 mg via INTRAVENOUS

## 2019-07-06 MED ORDER — FENTANYL CITRATE (PF) 100 MCG/2ML IJ SOLN
INTRAMUSCULAR | Status: AC | PRN
  Administered 2019-07-06: 50 ug via INTRAVENOUS
  Administered 2019-07-06: 25 ug via INTRAVENOUS

## 2019-07-06 MED ORDER — LIDOCAINE HCL 1 % IJ SOLN
INTRAMUSCULAR | Status: AC
  Filled 2019-07-06: qty 20

## 2019-07-06 MED ORDER — IOHEXOL 300 MG/ML  SOLN
50.0000 mL | Freq: Once | INTRAMUSCULAR | Status: AC | PRN
  Administered 2019-07-06: 15 mL

## 2019-07-06 MED ORDER — GLUCAGON HCL RDNA (DIAGNOSTIC) 1 MG IJ SOLR
INTRAMUSCULAR | Status: AC
  Filled 2019-07-06: qty 1

## 2019-07-06 MED ORDER — LORAZEPAM 2 MG/ML IJ SOLN
INTRAMUSCULAR | Status: AC
  Filled 2019-07-06: qty 1

## 2019-07-06 MED ORDER — CEFAZOLIN SODIUM-DEXTROSE 2-4 GM/100ML-% IV SOLN
INTRAVENOUS | Status: AC
  Administered 2019-07-06: 2 g via INTRAVENOUS
  Filled 2019-07-06: qty 100

## 2019-07-06 MED ORDER — LORAZEPAM 2 MG/ML IJ SOLN
0.5000 mg | Freq: Once | INTRAMUSCULAR | Status: AC
  Administered 2019-07-06: 0.5 mg via INTRAVENOUS

## 2019-07-06 MED ORDER — MIDAZOLAM HCL 2 MG/2ML IJ SOLN
INTRAMUSCULAR | Status: AC | PRN
  Administered 2019-07-06: 1 mg via INTRAVENOUS
  Administered 2019-07-06: 0.5 mg via INTRAVENOUS

## 2019-07-06 MED ORDER — FENTANYL CITRATE (PF) 100 MCG/2ML IJ SOLN
INTRAMUSCULAR | Status: AC
  Filled 2019-07-06: qty 4

## 2019-07-06 MED ORDER — LIDOCAINE HCL (PF) 1 % IJ SOLN
INTRAMUSCULAR | Status: AC | PRN
  Administered 2019-07-06: 5 mL

## 2019-07-06 NOTE — Progress Notes (Signed)
Physical Therapy Treatment Patient Details Name: Levi Pruitt MRN: [id number] DOB: Jan 25, 1946 Today's Date: 07/06/2019    History of Present Illness Pt is a 74 yo male admitted 06/18/19 with a stroke due to embolism of L PCA on 06/18/19. 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 Since admission, pt has been dx with possible pneumonia, acute on chronic CHF, AKI, HLD, and inferior wall STEMI. Plan for PEG placement 1/26. No PMH on file.    PT Comments    Pt more lethargic today and required increased stimuli to to stay awake during session. Pt continues with dense R hemi plegia and R neglect. Pt is improving with his ability to maintain EOB balance. Pt continues to require maxA for all mobility. Spoke with dtr Levi Pruitt about his progress. Plan is for pt to have PEG tube placed and admitted to CIR when bed available. Acute PT to cont to follow.    Follow Up Recommendations  CIR;Supervision/Assistance - 24 hour     Equipment Recommendations       Recommendations for Other Services Rehab consult     Precautions / Restrictions Precautions Precautions: Fall Precaution Comments: dense R hemiparesis with inattention; NGT; bowel/bladder incontinence Restrictions Weight Bearing Restrictions: No    Mobility  Bed Mobility Overal bed mobility: Needs Assistance Bed Mobility: Rolling;Sidelying to Sit Rolling: Max assist;+2 for physical assistance Sidelying to sit: Max assist;Total assist;+2 for physical assistance          Transfers Overall transfer level: Needs assistance Equipment used: 2 person hand held assist Transfers: Lateral/Scoot Transfers          Lateral/Scoot Transfers: Total assist;+2 physical assistance;+2 safety/equipment(3rd person in back due to weight) General transfer comment: pt with little initiation and voluntary  effort, transfered to the L   Ambulation/Gait             General Gait Details: unable   Stairs             Wheelchair Mobility    Modified Rankin (Stroke Patients Only) Modified Rankin (Stroke Patients Only) Pre-Morbid Rankin Score: No symptoms Modified Rankin: Severe disability     Balance Overall balance assessment: Needs assistance Sitting-balance support: No upper extremity supported;Feet supported Sitting balance-Leahy Scale: Poor Sitting balance - Comments: worked on trunk control and EOB balance, pt occasionally able to maintain midline with close min guard for about 30 sec,  Postural control: Right lateral lean                                  Cognition Arousal/Alertness: Lethargic Behavior During Therapy: Flat affect Overall Cognitive Status: Impaired/Different from baseline Area of Impairment: Attention;Following commands;Safety/judgement;Awareness;Problem solving;Orientation;Memory                 Orientation Level: Time;Situation;Place Current Attention Level: Focused Memory: Decreased short-term memory Following Commands: Follows one step commands with increased time;Follows one step commands inconsistently Safety/Judgement: Decreased awareness of safety;Decreased awareness of deficits(R sided neglect) Awareness: Intellectual Problem Solving: Slow processing;Difficulty sequencing;Requires verbal cues;Requires tactile cues;Decreased initiation General Comments: Pt more lethargic/sleepy today, harder to keep attention, less initiation and ability to stay awake to actively participate      Exercises      General Comments General comments (skin integrity, edema, etc.): VSS      Pertinent Vitals/Pain Pain Assessment: Faces  Faces Pain Scale: No hurt    Home Living                      Prior Function            PT Goals (current goals can now be found in the care plan section) Progress towards PT goals:  Progressing toward goals    Frequency    Min 4X/week      PT Plan Current plan remains appropriate    Co-evaluation              AM-PAC PT "6 Clicks" Mobility   Outcome Measure  Help needed turning from your back to your side while in a flat bed without using bedrails?: A Lot Help needed moving from lying on your back to sitting on the side of a flat bed without using bedrails?: Total Help needed moving to and from a bed to a chair (including a wheelchair)?: Total Help needed standing up from a chair using your arms (e.g., wheelchair or bedside chair)?: Total Help needed to walk in hospital room?: Total Help needed climbing 3-5 steps with a railing? : Total 6 Click Score: 7    End of Session Equipment Utilized During Treatment: Oxygen Activity Tolerance: Patient tolerated treatment well;Patient limited by fatigue Patient left: in chair;with call bell/phone within reach;with family/visitor present;with chair alarm set Nurse Communication: Mobility status;Need for lift equipment PT Visit Diagnosis: Muscle weakness (generalized) (M62.81);Difficulty in walking, not elsewhere classified (R26.2);Unsteadiness on feet (R26.81);Other symptoms and signs involving the nervous system (R29.898);Hemiplegia and hemiparesis Hemiplegia - Right/Left: Right Hemiplegia - dominant/non-dominant: Dominant Hemiplegia - caused by: Cerebral infarction     Time: 1004-1036 PT Time Calculation (min) (ACUTE ONLY): 32 min  Charges:  $Therapeutic Activity: 8-22 mins $Neuromuscular Re-education: 8-22 mins                     Kittie Plater, PT, DPT Acute Rehabilitation Services Pager #: [phone] Office #: [phone]    Berline Lopes 07/06/2019, 11:54 AM

## 2019-07-06 NOTE — Procedures (Signed)
Interventional Radiology Procedure Note  Procedure: Placement of percutaneous 20F pull-through gastrostomy tube. Complications: None Recommendations: - NPO except for sips and chips remainder of today and overnight - Maintain G-tube to LWS until tomorrow morning  - May advance diet as tolerated and begin using tube tomorrow morning  Signed,  Glynn Freas K. Micaiah Litle, MD   

## 2019-07-06 NOTE — Progress Notes (Signed)
Patient ID: Levi Pruitt, male   DOB: August 24, 1945, 74 y.o.   MRN: [id number]  This NP visited patient at the bedside as a follow up for  Palliative  Medicine  needs and emotional support.   Patient is weak,  appears generally uncomfortable.  They had to restrain him last night as he continually tried to get OOB   Continues to have inadequate po intake, high risk for aspiration.  Plan is for PEG today.    Spoke with  SO/[name]/[name], for continued conversation regarding diagnosis, prognosis, treatment option decisions, advanced directive decisions,  disposition and anticipatory care needs.   I continue to share my concern for long term poor prognosis and meaningful recovery within the context of patient's AD and patient's values.  Again  we discussed risk and benefits of PEG and the importance of placing a  time frame consideration/trial before intervention and along the course of the patient's medical path.   Plan of Care: -Full code-   Again  recommended DNR/DNI understanding poor  outcomes in similar patients, [name] will consider - open to PEG placement  -family is open to all offered and available medical interventions to prolong life.  They are hopeful for meaningful recovery   Patient has a docuemtebnd [name] and a Declaration of a Desire for a Natrual Death   Discussed with [name] the importance of continued conversation with family and the  medical providers regarding overall plan of care and treatment options,  ensuring decisions are within the context of the patients values and GOCs.   Education and emotional support offered  Questions and concerns addressed    Plan is to meet later this week in person with [name] for ongoing discussion regarding long term treatment plan   Total time spent on the unit was 35 minutes  Greater than 50% of the time was spent in counseling and coordination of care  [name] NP  Palliative Medicine Team Team Phone # [phone] Pager [phone]

## 2019-07-06 NOTE — Progress Notes (Signed)
Levi Pruitt  PROGRESS NOTE    Levi Pruitt  [id number] DOB: [birth year] DOA: 06/18/2019 PCP: Patient, No Pcp Per   Brief Narrative:   42 white male-known history of obesity BMI 32 presented code stroke 06/18/2019-CT = large acute PCA infarct EKG = ST elevation MI with reciprocal changes-patient started on heparin Cardiology and neurology consulted Because of dense hemiparesis as well as prior advanced care directives stating he would not want artificial nutrition, palliative care was consulted He is currently being treated for probable aspiration pneumonia in addition to volume depletion secondary to inadequate p.o. intake  07/06/19:PEG today? Hopeful for positive CIR assessment after. Otherwise, we need to look for SNF. He looks good today. Continue weaning O2.     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 - NPO d/t dysphagia, to have PEG placed - per neuro: ASA 325 and plavix DAPT for 3 months and then ASA alone - A1c 06/19/19: 5.3 - LDL: 99, HDL: 32, Tchol: 152     - 07/06/19: CIR eval after PEG; resume plavix after PEG  Aspiration pneumonia Dysphagia - RT had to deep suction several times on 1/15 - CXR 1/14 shows worsening with right upper lobe infiltrates-azithromycin discontinued 1/17 continue ceftriaxone and transition to oral meds once PEG tube is placed - Speech therapy, palliative medicine and attending physician have all discussed with the  family and [facility] POA understands risk benefits and alternatives to PEG tube placement which will be performed likely 1/20 - Continue IV D5 40 cc/H for now and likely can give free water via PEG tube once it is placed - IR consulted for PEG; appreciate assistance - needs to be off plavix for 5 days - CIR rec'd by PT,ip rehab consulted, to follow up on Monday - 07/03/19: likely had aspiration last night, got lasix, but still with UAT; needs suction; wean O2 as able, WBC up, but no fever; hold abx for now - 07/04/19: respiration better today; sats ok; wean O2; no need for abx at this time.     - 07/06/19: PEG today?  Acute inferior STEMI - Initially heparin- troponins trended up - cardiology did not feel was a candidate for anything but medical interventions Heparin IV was discontinued - repeat echo 1/15: EF 30-35%. G3 diastolic dysfxn (essentially same as 06/19/19 echo) - Coreg held d/t hypotension and then changed to coreg CR 10mg  daily - ACEi held d/t BP -coreg3.125 mg BID - 07/04/19: pressures are moving down as well as HR; place hold parameters on coreg     - 07/06/19: BP is acceptable, continue current Tx.  Systolic, diastolic heart failure with pulmonary edema - last lasix 1/15; monitor daily wts, I&Os     - he's overall a liter down since admission; continue monitoring fluid status.  CKD stage II  mild hypernatremia mild metabolic acidosis - hypernatremia ok, monitor - acidosis resolved, monitor -Scr up after lasix, monitor for now. - 07/05/19: resolved  8 mm parotid neoplasm -  Outpatient follow-up  yeast intertriginous - completed diflucan; improved  DVT prophylaxis: SCDs Code Status: FULL Family Communication: None at bedside   Disposition Plan: PEG today. CIR eval. Hopefully to CIR soon.   Consultants:   IR  PC  Subjective: No acute events ON  Objective: Vitals:   07/06/19 0035  07/06/19 0400 07/06/19 0412 07/06/19 0822  BP:  130/71  112/79  Pulse: 78 71  71  Resp: (!) 21   (!) 24  Temp:    98.1 F (36.7 C)  TempSrc:    Axillary  SpO2: (!) 88% 93%  92%  Weight:   93.8 kg   Height:        Intake/Output Summary (Last 24 hours) at 07/06/2019 1051 Last data filed at 07/06/2019 0111 Gross per 24 hour  Intake 997.97 ml  Output 750 ml  Net 247.97 ml   Filed Weights   06/30/19 0540 07/02/19 0500 07/06/19 0412  Weight: 95.9 kg 95 kg 93.8 kg    Examination:  General: 74 y.o. male resting in bed in NAD Cardiovascular: RRR, +S1, S2, no m/g/r, equal pulses throughout Respiratory: CTABL, no w/r/r, normal WOB GI: BS+, NDNT, soft MSK: No e/c/c Neuro: Alert to name, follows commands Psyc: calm/cooperative   Data Reviewed: I have personally reviewed following labs and imaging studies.  CBC: Recent Labs  Lab 07/01/19 0253 07/01/19 0253 07/02/19 0212 07/03/19 0429 07/04/19 1048 07/05/19 0341 07/06/19 0747  WBC 14.9*   < > 14.1* 19.7* 13.2* 15.0* 14.8*  NEUTROABS 11.7*  --  11.2* 16.6* 10.1* 11.3*  --   HGB 13.6   < > 13.7 14.6 13.1 14.0 14.2  HCT 42.1   < > 42.5 44.3 40.7 43.9 43.2  MCV 97.2   < > 97.5 94.3 96.4 96.1 93.9  PLT 262   < > 304 285 262 297 279   < > = values in this interval not displayed.   Basic Metabolic Panel: Recent Labs  Lab 07/01/19 0253 07/02/19 0212 07/03/19 0429 07/04/19 1048 07/05/19 0341  NA 140 139 138 139 139  K 3.8 3.6 3.7 3.5 3.3*  CL 105 104 101 102 100  CO2 23 22 25 26 26   GLUCOSE 94 88 108* 100* 97  BUN 17 14 15 18 17   CREATININE 1.16 1.00 1.25* 1.10 1.10  CALCIUM 8.3* 8.2* 8.3* 8.1* 8.4*  MG 1.9 1.9 1.9  --  2.1  PHOS 3.6 3.3 3.7 4.0 3.8   GFR: Estimated Creatinine Clearance: 67.6 mL/min (by C-G formula based on SCr of 1.1 mg/dL). Liver Function Tests: Recent Labs  Lab 07/01/19 0253 07/02/19 0212 07/03/19 0429 07/04/19 1048 07/05/19 0341  ALBUMIN 2.2* 2.2* 2.4* 2.1* 2.2*   No results for  input(s): LIPASE, AMYLASE in the last 168 hours. No results for input(s): AMMONIA in the last 168 hours. Coagulation Profile: Recent Labs  Lab 07/06/19 0747  INR 1.0   Cardiac Enzymes: No results for input(s): CKTOTAL, CKMB, CKMBINDEX, TROPONINI in the last 168 hours. BNP (last 3 results) No results for input(s): PROBNP in the last 8760 hours. HbA1C: No results for input(s): HGBA1C in the last 72 hours. CBG: Recent Labs  Lab 07/05/19 1531 07/05/19 1914 07/05/19 2321 07/06/19 0407 07/06/19 0819  GLUCAP 112* 119* 111* 105* 104*   Lipid Profile: No results for input(s): CHOL, HDL, LDLCALC, TRIG, CHOLHDL, LDLDIRECT in the last 72 hours. Thyroid Function Tests: No results for input(s): TSH, T4TOTAL, FREET4, T3FREE, THYROIDAB in the last 72 hours. Anemia Panel: No  results for input(s): VITAMINB12, FOLATE, FERRITIN, TIBC, IRON, RETICCTPCT in the last 72 hours. Sepsis Labs: No results for input(s): PROCALCITON, LATICACIDVEN in the last 168 hours.  No results found for this or any previous visit (from the past 240 hour(s)).    Radiology Studies: No results found.   Scheduled Meds: .  stroke: mapping our early stages of recovery book   Does not apply Once  . aspirin  324 mg Per Tube Daily  . atorvastatin  80 mg Per Tube q1800  . carvedilol  3.125 mg Per Tube BID WC  . [START ON 07/07/2019] heparin  5,000 Units Subcutaneous Q8H   Continuous Infusions: . dextrose 40 mL/hr at 07/06/19 0032     LOS: 18 days    Time spent: 25 minutes spent in the coordination of care today.    Jonnie Finner, DO [facility]  If 7PM-7AM, please contact night-coverage [email or web address] 07/06/2019, 10:51 AM

## 2019-07-07 ENCOUNTER — Encounter (HOSPITAL_COMMUNITY): Payer: Self-pay | Admitting: Obstetrics and Gynecology

## 2019-07-07 ENCOUNTER — Inpatient Hospital Stay (HOSPITAL_COMMUNITY): Payer: Medicare Other

## 2019-07-07 ENCOUNTER — Inpatient Hospital Stay (HOSPITAL_COMMUNITY)
Admission: RE | Admit: 2019-07-07 | Discharge: 2019-07-15 | DRG: 056 | Disposition: A | Discharge status: Other Acute Inpatient Hospital | Payer: Medicare Other | Source: Intra-hospital | Attending: Physical Medicine & Rehabilitation | Admitting: Physical Medicine & Rehabilitation

## 2019-07-07 ENCOUNTER — Encounter (HOSPITAL_COMMUNITY): Payer: Self-pay | Admitting: Physical Medicine & Rehabilitation

## 2019-07-07 DIAGNOSIS — R918 Other nonspecific abnormal finding of lung field: Secondary | ICD-10-CM | POA: Diagnosis present

## 2019-07-07 DIAGNOSIS — R197 Diarrhea, unspecified: Secondary | ICD-10-CM | POA: Diagnosis not present

## 2019-07-07 DIAGNOSIS — R338 Other retention of urine: Secondary | ICD-10-CM | POA: Diagnosis present

## 2019-07-07 DIAGNOSIS — S80211A Abrasion, right knee, initial encounter: Secondary | ICD-10-CM | POA: Diagnosis present

## 2019-07-07 DIAGNOSIS — I5023 Acute on chronic systolic (congestive) heart failure: Secondary | ICD-10-CM | POA: Diagnosis present

## 2019-07-07 DIAGNOSIS — I69322 Dysarthria following cerebral infarction: Secondary | ICD-10-CM | POA: Diagnosis not present

## 2019-07-07 DIAGNOSIS — E46 Unspecified protein-calorie malnutrition: Secondary | ICD-10-CM | POA: Diagnosis present

## 2019-07-07 DIAGNOSIS — I69351 Hemiplegia and hemiparesis following cerebral infarction affecting right dominant side: Secondary | ICD-10-CM | POA: Diagnosis present

## 2019-07-07 DIAGNOSIS — I69391 Dysphagia following cerebral infarction: Secondary | ICD-10-CM

## 2019-07-07 DIAGNOSIS — I69390 Apraxia following cerebral infarction: Secondary | ICD-10-CM

## 2019-07-07 DIAGNOSIS — J9601 Acute respiratory failure with hypoxia: Secondary | ICD-10-CM | POA: Diagnosis present

## 2019-07-07 DIAGNOSIS — I509 Heart failure, unspecified: Secondary | ICD-10-CM | POA: Diagnosis present

## 2019-07-07 DIAGNOSIS — R609 Edema, unspecified: Secondary | ICD-10-CM | POA: Diagnosis present

## 2019-07-07 DIAGNOSIS — X58XXXA Exposure to other specified factors, initial encounter: Secondary | ICD-10-CM | POA: Diagnosis present

## 2019-07-07 DIAGNOSIS — B370 Candidal stomatitis: Secondary | ICD-10-CM | POA: Diagnosis present

## 2019-07-07 DIAGNOSIS — N401 Enlarged prostate with lower urinary tract symptoms: Secondary | ICD-10-CM | POA: Diagnosis present

## 2019-07-07 DIAGNOSIS — S80212A Abrasion, left knee, initial encounter: Secondary | ICD-10-CM | POA: Diagnosis present

## 2019-07-07 DIAGNOSIS — G4700 Insomnia, unspecified: Secondary | ICD-10-CM | POA: Diagnosis not present

## 2019-07-07 DIAGNOSIS — Z6829 Body mass index (BMI) 29.0-29.9, adult: Secondary | ICD-10-CM | POA: Diagnosis not present

## 2019-07-07 DIAGNOSIS — I639 Cerebral infarction, unspecified: Secondary | ICD-10-CM

## 2019-07-07 DIAGNOSIS — I2510 Atherosclerotic heart disease of native coronary artery without angina pectoris: Secondary | ICD-10-CM | POA: Diagnosis not present

## 2019-07-07 DIAGNOSIS — R32 Unspecified urinary incontinence: Secondary | ICD-10-CM | POA: Diagnosis present

## 2019-07-07 DIAGNOSIS — I2119 ST elevation (STEMI) myocardial infarction involving other coronary artery of inferior wall: Secondary | ICD-10-CM | POA: Diagnosis present

## 2019-07-07 DIAGNOSIS — R0682 Tachypnea, not elsewhere classified: Secondary | ICD-10-CM

## 2019-07-07 DIAGNOSIS — I110 Hypertensive heart disease with heart failure: Secondary | ICD-10-CM | POA: Diagnosis present

## 2019-07-07 DIAGNOSIS — I6521 Occlusion and stenosis of right carotid artery: Secondary | ICD-10-CM | POA: Diagnosis present

## 2019-07-07 DIAGNOSIS — R1312 Dysphagia, oropharyngeal phase: Secondary | ICD-10-CM | POA: Diagnosis not present

## 2019-07-07 DIAGNOSIS — I5043 Acute on chronic combined systolic (congestive) and diastolic (congestive) heart failure: Secondary | ICD-10-CM

## 2019-07-07 DIAGNOSIS — I255 Ischemic cardiomyopathy: Secondary | ICD-10-CM | POA: Diagnosis not present

## 2019-07-07 DIAGNOSIS — Z931 Gastrostomy status: Secondary | ICD-10-CM | POA: Diagnosis not present

## 2019-07-07 DIAGNOSIS — I63432 Cerebral infarction due to embolism of left posterior cerebral artery: Principal | ICD-10-CM

## 2019-07-07 DIAGNOSIS — Z79899 Other long term (current) drug therapy: Secondary | ICD-10-CM

## 2019-07-07 DIAGNOSIS — Z09 Encounter for follow-up examination after completed treatment for conditions other than malignant neoplasm: Secondary | ICD-10-CM

## 2019-07-07 DIAGNOSIS — Z515 Encounter for palliative care: Secondary | ICD-10-CM | POA: Diagnosis not present

## 2019-07-07 DIAGNOSIS — J690 Pneumonitis due to inhalation of food and vomit: Secondary | ICD-10-CM | POA: Diagnosis present

## 2019-07-07 DIAGNOSIS — D490 Neoplasm of unspecified behavior of digestive system: Secondary | ICD-10-CM | POA: Diagnosis present

## 2019-07-07 DIAGNOSIS — Z7189 Other specified counseling: Secondary | ICD-10-CM | POA: Diagnosis not present

## 2019-07-07 DIAGNOSIS — I69319 Unspecified symptoms and signs involving cognitive functions following cerebral infarction: Secondary | ICD-10-CM

## 2019-07-07 DIAGNOSIS — Z8249 Family history of ischemic heart disease and other diseases of the circulatory system: Secondary | ICD-10-CM

## 2019-07-07 DIAGNOSIS — R062 Wheezing: Secondary | ICD-10-CM

## 2019-07-07 DIAGNOSIS — R0603 Acute respiratory distress: Secondary | ICD-10-CM | POA: Diagnosis not present

## 2019-07-07 DIAGNOSIS — R339 Retention of urine, unspecified: Secondary | ICD-10-CM

## 2019-07-07 DIAGNOSIS — Z8673 Personal history of transient ischemic attack (TIA), and cerebral infarction without residual deficits: Secondary | ICD-10-CM | POA: Diagnosis not present

## 2019-07-07 DIAGNOSIS — I63532 Cerebral infarction due to unspecified occlusion or stenosis of left posterior cerebral artery: Secondary | ICD-10-CM | POA: Diagnosis not present

## 2019-07-07 DIAGNOSIS — Z9189 Other specified personal risk factors, not elsewhere classified: Secondary | ICD-10-CM

## 2019-07-07 DIAGNOSIS — R1310 Dysphagia, unspecified: Secondary | ICD-10-CM | POA: Diagnosis present

## 2019-07-07 LAB — URINALYSIS, ROUTINE W REFLEX MICROSCOPIC
Bilirubin Urine: NEGATIVE
Glucose, UA: NEGATIVE mg/dL
Hgb urine dipstick: NEGATIVE
Ketones, ur: NEGATIVE mg/dL
Leukocytes,Ua: NEGATIVE
Nitrite: NEGATIVE
Protein, ur: NEGATIVE mg/dL
Specific Gravity, Urine: 1.005 (ref 1.005–1.030)
pH: 6 (ref 5.0–8.0)

## 2019-07-07 LAB — GLUCOSE, CAPILLARY
Glucose-Capillary: 91 mg/dL (ref 70–99)
Glucose-Capillary: 101 mg/dL — ABNORMAL HIGH (ref 70–99)
Glucose-Capillary: 107 mg/dL — ABNORMAL HIGH (ref 70–99)

## 2019-07-07 LAB — RENAL FUNCTION PANEL
Albumin: 2.2 g/dL — ABNORMAL LOW (ref 3.5–5.0)
Anion gap: 11 (ref 5–15)
BUN: 12 mg/dL (ref 8–23)
CO2: 27 mmol/L (ref 22–32)
Calcium: 8.3 mg/dL — ABNORMAL LOW (ref 8.9–10.3)
Chloride: 102 mmol/L (ref 98–111)
Creatinine, Ser: 1.1 mg/dL (ref 0.61–1.24)
GFR calc Af Amer: >60 mL/min (ref >60)
GFR calc non Af Amer: >60 mL/min (ref >60)
Glucose, Bld: 85 mg/dL (ref 70–99)
Phosphorus: 3.7 mg/dL (ref 2.5–4.6)
Potassium: 3.5 mmol/L (ref 3.5–5.1)
Sodium: 140 mmol/L (ref 135–145)

## 2019-07-07 LAB — CBC WITH DIFFERENTIAL/PLATELET
Abs Immature Granulocytes: 0.08 10*3/uL — ABNORMAL HIGH (ref 0.00–0.07)
Basophils Absolute: 0.1 10*3/uL (ref 0.0–0.1)
Basophils Relative: 1 %
Eosinophils Absolute: 0.2 10*3/uL (ref 0.0–0.5)
Eosinophils Relative: 2 %
HCT: 45.1 % (ref 39.0–52.0)
Hemoglobin: 14.4 g/dL (ref 13.0–17.0)
Immature Granulocytes: 1 %
Lymphocytes Relative: 13 %
Lymphs Abs: 1.5 10*3/uL (ref 0.7–4.0)
MCH: 30.8 pg (ref 26.0–34.0)
MCHC: 31.9 g/dL (ref 30.0–36.0)
MCV: 96.6 fL (ref 80.0–100.0)
Monocytes Absolute: 1.7 10*3/uL — ABNORMAL HIGH (ref 0.1–1.0)
Monocytes Relative: 14 %
Neutro Abs: 8.3 10*3/uL — ABNORMAL HIGH (ref 1.7–7.7)
Neutrophils Relative %: 69 %
Platelets: 281 10*3/uL (ref 150–400)
RBC: 4.67 MIL/uL (ref 4.22–5.81)
RDW: 13.5 % (ref 11.5–15.5)
WBC: 11.9 10*3/uL — ABNORMAL HIGH (ref 4.0–10.5)
nRBC: 0 % (ref 0.0–0.2)

## 2019-07-07 LAB — MAGNESIUM: Magnesium: 1.9 mg/dL (ref 1.7–2.4)

## 2019-07-07 MED ORDER — POLYETHYLENE GLYCOL 3350 17 G PO PACK: 17.0000 g | PACK | Freq: Every day | ORAL | Status: DC | PRN

## 2019-07-07 MED ORDER — DIPHENHYDRAMINE HCL 12.5 MG/5ML PO ELIX: 12.5000 mg | ORAL_SOLUTION | Freq: Four times a day (QID) | ORAL | Status: DC | PRN

## 2019-07-07 MED ORDER — TRAZODONE HCL 50 MG PO TABS
25.0000 mg | ORAL_TABLET | Freq: Every evening | ORAL | Status: DC | PRN
  Administered 2019-07-08: 50 mg
  Administered 2019-07-08: 25 mg
  Filled 2019-07-07 (×2): qty 1

## 2019-07-07 MED ORDER — PROCHLORPERAZINE 25 MG RE SUPP: 12.5000 mg | Freq: Four times a day (QID) | RECTAL | Status: DC | PRN

## 2019-07-07 MED ORDER — LIDOCAINE HCL URETHRAL/MUCOSAL 2 % EX GEL: 1.0000 "application " | CUTANEOUS | Status: DC | PRN

## 2019-07-07 MED ORDER — CARVEDILOL 3.125 MG PO TABS
3.1250 mg | ORAL_TABLET | Freq: Two times a day (BID) | ORAL | Status: DC
  Administered 2019-07-07 – 2019-07-14 (×15): 3.125 mg
  Filled 2019-07-07 (×15): qty 1

## 2019-07-07 MED ORDER — PROCHLORPERAZINE MALEATE 5 MG PO TABS: 5.0000 mg | ORAL_TABLET | Freq: Four times a day (QID) | ORAL | Status: DC | PRN

## 2019-07-07 MED ORDER — OSMOLITE 1.5 CAL PO LIQD
1000.0000 mL | ORAL | Status: DC
  Filled 2019-07-07 (×2): qty 1000

## 2019-07-07 MED ORDER — OSMOLITE 1.5 CAL PO LIQD
1000.0000 mL | ORAL | Status: DC
  Administered 2019-07-07: 1000 mL
  Filled 2019-07-07: qty 1000

## 2019-07-07 MED ORDER — LIDOCAINE HCL URETHRAL/MUCOSAL 2 % EX GEL
1.0000 "application " | CUTANEOUS | Status: AC
  Filled 2019-07-07: qty 5

## 2019-07-07 MED ORDER — GUAIFENESIN-DM 100-10 MG/5ML PO SYRP
5.0000 mL | ORAL_SOLUTION | Freq: Four times a day (QID) | ORAL | Status: DC | PRN
  Administered 2019-07-08: 10 mL via ORAL
  Filled 2019-07-07 (×2): qty 10

## 2019-07-07 MED ORDER — ORAL CARE MOUTH RINSE
15.0000 mL | Freq: Two times a day (BID) | OROMUCOSAL | Status: DC
  Administered 2019-07-07 – 2019-07-14 (×15): 15 mL via OROMUCOSAL

## 2019-07-07 MED ORDER — FLEET ENEMA 7-19 GM/118ML RE ENEM: 1.0000 | ENEMA | Freq: Once | RECTAL | Status: DC | PRN

## 2019-07-07 MED ORDER — ACETAMINOPHEN 325 MG PO TABS
325.0000 mg | ORAL_TABLET | ORAL | Status: DC | PRN
  Administered 2019-07-08: 325 mg via ORAL
  Administered 2019-07-13: 650 mg via ORAL
  Filled 2019-07-07 (×2): qty 2

## 2019-07-07 MED ORDER — ATORVASTATIN CALCIUM 80 MG PO TABS: 80.0000 mg | ORAL_TABLET | Freq: Every day | ORAL | Status: DC

## 2019-07-07 MED ORDER — CLOPIDOGREL BISULFATE 75 MG PO TABS
75.0000 mg | ORAL_TABLET | Freq: Every day | ORAL | Status: DC
  Administered 2019-07-08 – 2019-07-14 (×7): 75 mg
  Filled 2019-07-07 (×7): qty 1

## 2019-07-07 MED ORDER — CARVEDILOL 3.125 MG PO TABS: 3.1250 mg | ORAL_TABLET | Freq: Two times a day (BID) | ORAL | Status: DC

## 2019-07-07 MED ORDER — ASPIRIN 81 MG PO CHEW: 324.0000 mg | CHEWABLE_TABLET | Freq: Every day | ORAL | Status: DC

## 2019-07-07 MED ORDER — ASPIRIN 81 MG PO CHEW
324.0000 mg | CHEWABLE_TABLET | Freq: Every day | ORAL | Status: DC
  Administered 2019-07-08 – 2019-07-14 (×7): 324 mg
  Filled 2019-07-07 (×7): qty 4

## 2019-07-07 MED ORDER — LIDOCAINE HCL URETHRAL/MUCOSAL 2 % EX GEL: CUTANEOUS | Status: DC | PRN

## 2019-07-07 MED ORDER — CHLORHEXIDINE GLUCONATE 0.12 % MT SOLN
15.0000 mL | Freq: Two times a day (BID) | OROMUCOSAL | Status: DC
  Administered 2019-07-07 – 2019-07-14 (×15): 15 mL via OROMUCOSAL
  Filled 2019-07-07 (×15): qty 15

## 2019-07-07 MED ORDER — PROCHLORPERAZINE EDISYLATE 10 MG/2ML IJ SOLN: 5.0000 mg | Freq: Four times a day (QID) | INTRAMUSCULAR | Status: DC | PRN

## 2019-07-07 MED ORDER — HEPARIN SODIUM (PORCINE) 5000 UNIT/ML IJ SOLN: 5000.0000 [IU] | Freq: Three times a day (TID) | INTRAMUSCULAR | Status: DC

## 2019-07-07 MED ORDER — IPRATROPIUM-ALBUTEROL 0.5-2.5 (3) MG/3ML IN SOLN
3.0000 mL | Freq: Four times a day (QID) | RESPIRATORY_TRACT | Status: DC | PRN
  Administered 2019-07-09 – 2019-07-15 (×6): 3 mL via RESPIRATORY_TRACT
  Filled 2019-07-07 (×5): qty 3

## 2019-07-07 MED ORDER — FREE WATER
100.0000 mL | Freq: Four times a day (QID) | Status: DC
  Administered 2019-07-07: 100 mL

## 2019-07-07 MED ORDER — FREE WATER
100.0000 mL | Freq: Four times a day (QID) | Status: DC
  Administered 2019-07-07 – 2019-07-08 (×4): 100 mL

## 2019-07-07 MED ORDER — CLOPIDOGREL BISULFATE 75 MG PO TABS
75.0000 mg | ORAL_TABLET | Freq: Every day | ORAL | Status: DC
  Administered 2019-07-07: 75 mg
  Filled 2019-07-07: qty 1

## 2019-07-07 MED ORDER — ALUM & MAG HYDROXIDE-SIMETH 200-200-20 MG/5ML PO SUSP: 30.0000 mL | ORAL | Status: DC | PRN

## 2019-07-07 MED ORDER — BISACODYL 10 MG RE SUPP: 10.0000 mg | Freq: Every day | RECTAL | Status: DC | PRN

## 2019-07-07 MED ORDER — HEPARIN SODIUM (PORCINE) 5000 UNIT/ML IJ SOLN
5000.0000 [IU] | Freq: Three times a day (TID) | INTRAMUSCULAR | Status: DC
  Administered 2019-07-07 – 2019-07-09 (×5): 5000 [IU] via SUBCUTANEOUS
  Filled 2019-07-07 (×5): qty 1

## 2019-07-07 MED ORDER — ATORVASTATIN CALCIUM 80 MG PO TABS
80.0000 mg | ORAL_TABLET | Freq: Every day | ORAL | Status: DC
  Administered 2019-07-07 – 2019-07-14 (×8): 80 mg
  Filled 2019-07-07 (×8): qty 1

## 2019-07-07 MED ORDER — CLOPIDOGREL BISULFATE 75 MG PO TABS: 75.0000 mg | ORAL_TABLET | Freq: Every day | ORAL | Status: DC

## 2019-07-07 NOTE — Plan of Care (Signed)
  Problem: Consults Goal: RH STROKE PATIENT EDUCATION Description: See Patient Education module for education specifics  Outcome: Progressing   Problem: RH BOWEL ELIMINATION Goal: RH STG MANAGE BOWEL WITH ASSISTANCE Description: STG Manage Bowel with min Assistance. Outcome: Progressing Goal: RH STG MANAGE BOWEL W/MEDICATION W/ASSISTANCE Description: STG Manage Bowel with Medication with min Assistance. Outcome: Progressing   Problem: RH BLADDER ELIMINATION Goal: RH STG MANAGE BLADDER WITH ASSISTANCE Description: STG Manage Bladder With min/mod Assistance Outcome: Progressing   Problem: RH SKIN INTEGRITY Goal: RH STG SKIN FREE OF INFECTION/BREAKDOWN Description: Patients skin will remain free from further infection or breakdown with mod assist. Outcome: Progressing Goal: RH STG MAINTAIN SKIN INTEGRITY WITH ASSISTANCE Description: STG Maintain Skin Integrity With mod Assistance. Outcome: Progressing Goal: RH STG ABLE TO PERFORM INCISION/WOUND CARE W/ASSISTANCE Description: STG Able To Perform Incision/Wound Care With mod Assistance. Outcome: Progressing   Problem: RH SAFETY Goal: RH STG ADHERE TO SAFETY PRECAUTIONS W/ASSISTANCE/DEVICE Description: STG Adhere to Safety Precautions With min Assistance/Device. Outcome: Progressing   Problem: RH COGNITION-NURSING Goal: RH STG USES MEMORY AIDS/STRATEGIES W/ASSIST TO PROBLEM SOLVE Description: STG Uses Memory Aids/Strategies With min Assistance to Problem Solve. Outcome: Progressing   Problem: RH PAIN MANAGEMENT Goal: RH STG PAIN MANAGED AT OR BELOW PT'S PAIN GOAL Description: < 3 Outcome: Progressing   Problem: RH KNOWLEDGE DEFICIT Goal: RH STG INCREASE KNOWLEDGE OF HYPERTENSION Description: Patient and family will verbalize understanding of HTN management including monitoring, diet, exercise, medications, and follow up care with min assist. Outcome: Progressing Goal: RH STG INCREASE KNOWLEGDE OF HYPERLIPIDEMIA Description:  Patient and family will verbalize understanding of HLD management including monitoring, diet, exercise, medications, and follow up care with min assist. Outcome: Progressing Goal: RH STG INCREASE KNOWLEDGE OF STROKE PROPHYLAXIS Description: Patient and family will verbalize understanding of stroke management including monitoring, diet, exercise, medications, and follow up care with min assist. Outcome: Progressing

## 2019-07-07 NOTE — Progress Notes (Signed)
PMR Admission Coordinator Pre-Admission Assessment   Patient: Levi Pruitt is an 74 y.o., male MRN: [id number] DOB: 1945/12/29 Height: 5' 9"  (175.3 cm) Weight: 92.7 kg   Insurance Information HMO:     PPO:      PCP:      IPA:      80/20:      OTHER:  PRIMARY: Medicare A and B      Policy#: [id number]      Subscriber: pt CM Name:       Phone#:      Fax#:  Pre-Cert#: verified eligibility online      Employer:  Benefits:  Phone #:      Name:  Eff. Date: 07/11/2010 A and B     Deduct: $1484      Out of Pocket Max: n/a      Life Max: n/a CIR: 100%      SNF: 20 full days  Outpatient: 80%     Co-Pay: 20% Home Health: 100%      Co-Pay:  DME: 80%     Co-Pay: 20% Providers: pt choice SECONDARY: BCBS Medicare Part D supplement      Policy#:       Subscriber:  CM Name:       Phone#:      Fax#:  Pre-Cert#:       Employer:  Benefits:  Phone #:      Name:  Eff. Date:      Deduct:       Out of Pocket Max:       Life Max:  CIR:       SNF:  Outpatient:      Co-Pay:  Home Health:       Co-Pay:  DME:      Co-Pay:    Medicaid Application Date:       Case Manager:  Disability Application Date:       Case Worker:    The "Data Collection Information Summary" for patients in Inpatient Rehabilitation Facilities with attached "Privacy Act [place] Records" was provided and verbally reviewed with: Patient and Family   Emergency Contact Information         Contact Information     Name Relation Home Work [place], [place] Significant other     [phone]    Arminda Resides Daughter     [phone]         Current Medical History  Patient Admitting Diagnosis: L CVAs   History of Present Illness: Pt is a 74 y/o male with no known significant PMH admitted to [facility] on 06/18/19 as a code stroke for slurred speech and flaccid RUE.  Per chart review, daughter spoke to pt in the evening on 1/7 and he appeared normal.  The following day neighbors noticed the papers were piling up on the front  porch and called for a wellness check.  EMS had to force their way in and pt was found in bed with above listed symptoms.  CT showed established large L PCA infarct with CTA confirming P1 occlusion.  Pt was not a TPA candidate as he was outside the window and not a candidate for thrombectomy as infarct was already established. EKG was concerning for ST elevated in inferior leads, cardiology felt patient not a candidate for emergent heart cath due to large CVA and recommended medical management of STEMI.  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  Neuro recommended DAPT x3 months, and then ASA alone.  ECHO revealed severe LV dysfunction with EF of 25-30% to be managed with low dose coreg and ACE.  Hospital course complicated by acute onset pulmonary edema and aspiration PNA 2/2 difficulty managing secretions.  Also CKD stage II, monitoring renal function.  Pt with PEG placed 1/26 and NPO except ice chips after oral care.  Palliative had been following pt and family elected to pursue all offered and available medical interventions to prolong life.  Therapy evaluations were completed and pt present with profound R hemiparesis and deficits with cognition, swallowing, and speech.  CIR was recommended for aggressive, multidisciplinary therapy to reduce burden of care for d/c to SNF or home with family.    Complete NIHSS TOTAL: 17   Patient's medical record from [facility] has been reviewed by the rehabilitation admission coordinator and physician.   Past Medical History      Past Medical History:  Diagnosis Date  . 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   Prior Rehab/Hospitalizations Has the patient had prior rehab or hospitalizations prior to admission? No   Has the patient had major surgery during 100 days prior to admission? Yes               Current Medications   Current Facility-Administered Medications:  .   stroke: mapping our early stages of recovery book, , Does not apply, Once, Wouk, Ailene Rud, MD .  acetaminophen (TYLENOL) tablet 650 mg, 650 mg, Oral, Q4H PRN **OR** acetaminophen (TYLENOL) 160 MG/5ML solution 650 mg, 650 mg, Per Tube, Q4H PRN **OR** acetaminophen (TYLENOL) suppository 650 mg, 650 mg, Rectal, Q4H PRN, Wouk, Ailene Rud, MD .  aspirin chewable tablet 324 mg, 324 mg, Per Tube, Daily, Kyle, Tyrone A, DO, 324 mg at 07/07/19 0904 .  atorvastatin (LIPITOR) tablet 80 mg, 80 mg, Per Tube, q1800, Kyle, Tyrone A, DO, 80 mg at 07/04/19 1805 .  carvedilol (COREG) tablet 3.125 mg, 3.125 mg, Per Tube, BID WC, Kyle, Tyrone A, DO, 3.125 mg at 07/07/19 0904 .  dextrose 5 % solution, , Intravenous, Continuous, Lang Snow, [place], Last Rate: 60 mL/hr at 07/07/19 0551, Rate Change at 07/07/19 0551 .  feeding supplement (OSMOLITE 1.5 CAL) liquid 1,000 mL, 1,000 mL, Per Tube, Continuous, Dessa Phi, DO .  heparin injection 5,000 Units, 5,000 Units, Subcutaneous, Q8H, Monia Sabal, PA-C, 5,000 Units at 07/07/19 0551 .  ipratropium-albuterol (DUONEB) 0.5-2.5 (3) MG/3ML nebulizer solution 3 mL, 3 mL, Nebulization, Q6H PRN, Dana Allan I, MD, 3 mL at 07/02/19 2015   Patients Current Diet:     Diet Order                      Diet NPO time specified Except for: Ice Chips  Diet effective now                   Precautions / Restrictions Precautions Precautions: Fall Precaution Comments: dense R hemiparesis with inattention; NGT; bowel/bladder incontinence Restrictions Weight Bearing Restrictions: No    Has the patient had 2 or more falls or a fall with injury in the past year? No   Prior Activity Level Community (5-7x/wk): independent PLOF, driving, working as a Psychologist, educational Care: Did the patient need help bathing, dressing, using the toilet or eating?  Independent    Indoor Mobility: Did the patient need assistance with walking from room to room (with or without device)? Independent   Stairs: Did the patient need assistance with internal or external stairs (with or without device)? Independent   Functional Cognition: Did the patient need help planning regular tasks such as shopping or remembering to take medications? Independent   Home Assistive Devices / Equipment Home Equipment: None('walking sticks')   Prior Device Use: Indicate devices/aids used by the patient prior to current illness, exacerbation or injury? None of the above   Current Functional Level Cognition   Arousal/Alertness: Lethargic Overall Cognitive Status: Impaired/Different from baseline Difficult to assess due to: Impaired communication(expressive difficulties) Current Attention Level: Focused Orientation Level: Oriented to person Following Commands: Follows one step commands with increased time, Follows one step commands inconsistently Safety/Judgement: Decreased awareness of safety, Decreased awareness of deficits(R sided neglect) General Comments: Pt more lethargic/sleepy today, harder to keep attention, less initiation and ability to stay awake to actively participate Comments: Difficult to assess due to lethargy and language deficits     Extremity Assessment (includes Sensation/Coordination)   Upper Extremity Assessment: Generalized weakness, RUE deficits/detail, LUE deficits/detail RUE Deficits / Details: flaccid, no response to noxious stimuli RUE Sensation: decreased proprioception, decreased light touch RUE Coordination: decreased gross motor, decreased fine motor LUE Deficits / Details: able to move to command, squeeze hand and raise UE; grossly 3+/5 MMT  LUE Sensation: WNL LUE Coordination: WNL  Lower Extremity Assessment: Defer to PT evaluation RLE Deficits / Details: no active movement on command, pt denies sensation. Pt demoed some movement at R ankle, could not  clarify if this was reflexive or voluntary. RLE Sensation: decreased light touch RLE Coordination: decreased fine motor, decreased gross motor LLE Deficits / Details: Pt with limited active movement, 2/5 throughout LLE Sensation: WNL     ADLs   Overall ADL's : Needs assistance/impaired Grooming: Maximal assistance, Sitting, Wash/dry hands, Brushing hair Grooming Details (indicate cue type and reason): seated in recliner, able to wash hands with inital hand over hand assist but sustained attention to wash R UE partially given multimodal cueing; min assist to comb hair with L UE for thoroughness  Toileting- Clothing Manipulation and Hygiene: Total assistance, +2 for safety/equipment, +2 for physical assistance, Bed level Toileting - Clothing Manipulation Details (indicate cue type and reason): total assist for bed level hygiene  Functional mobility during ADLs: Total assistance, +2 for physical assistance, +2 for safety/equipment General ADL Comments: requires max-total assist for all self care     Mobility   Overal bed mobility: Needs Assistance Bed Mobility: Rolling, Sidelying to Sit Rolling: Max assist, +2 for physical assistance Sidelying to sit: Max assist, Total assist, +2 for physical assistance Supine to sit: Max assist, +2 for physical assistance Sit to supine: Total assist, +2 for physical assistance General bed mobility comments: patient able to assist with rolling using L UE/LE given cueing; overall requiring max-total assist +2 due to sequencing, motor planning and initation (as well as weakness and impaired balance)      Transfers   Overall transfer level: Needs assistance Equipment used: 2 person hand held assist Transfer via Lift Equipment: Maximove Transfers: Lateral/Scoot Transfers Sit to Stand: Max assist, +2 physical assistance  Lateral/Scoot Transfers: Total assist, +2 physical assistance, +2 safety/equipment(3rd person in back due to weight) General transfer  comment: pt with little initiation and voluntary effort, transfered to the L      Ambulation / Gait / Stairs / Emergency planning/management officer  Ambulation/Gait General Gait Details: unable     Posture / Balance Dynamic Sitting Balance Sitting balance - Comments: worked on trunk control and EOB balance, pt occasionally able to maintain midline with close min guard for about 30 sec,  Balance Overall balance assessment: Needs assistance Sitting-balance support: No upper extremity supported, Feet supported Sitting balance-Leahy Scale: Poor Sitting balance - Comments: worked on trunk control and EOB balance, pt occasionally able to maintain midline with close min guard for about 30 sec,  Postural control: Right lateral lean Standing balance support: Bilateral upper extremity supported Standing balance-Leahy Scale: Zero     Special needs/care consideration BiPAP/CPAP no CPM no Continuous Drip IV no Dialysis no        Days n/a Life Vest no Oxygen on 2-3L in hospital Special Bed may benefit from air mattress  Trach Size n/a Wound Vac (area) no      Location n/a Skin new peg site                       Bowel mgmt: incontinent Bladder mgmt: incontinent Diabetic mgmt: no Behavioral consideration no Chemo/radiation no    Previous Home Environment (from acute therapy documentation) Living Arrangements: Alone  Lives With: Alone Available Help at Discharge: Family, Friend(s) Type of Home: House Home Layout: One level Home Access: Stairs to enter Entrance Stairs-Rails: Right, Left Entrance Stairs-Number of Steps: couple  Bathroom Shower/Tub: Chiropodist: Standard Additional Comments: daughter lives in [place], long time girlfriend works but available PRN    Discharge Living Setting Plans for Discharge Living Setting: Patient's home, Other (Comment)(most likely will d/c to SNF for further rehab) Type of Home at Discharge: [place] Name at Discharge:  tbd Discharge Home Layout: One level Discharge Home Access: Stairs to enter Entrance Stairs-Rails: None(front has BHR w/ grass to access steps, carport has no HR) Entrance Stairs-Number of Steps: 2 from carport, 6 from front Discharge Bathroom Shower/Tub: Tub/shower unit Discharge Bathroom Toilet: Standard Discharge Bathroom Accessibility: Yes How Accessible: Accessible via walker Does the patient have any problems obtaining your medications?: No   Social/Family/Support Systems Anticipated Caregiver: [name] (sig other), Amy (dtr, lives out of state, works remotely) Anticipated Ambulance person Information: [name] [phone], Alice [phone] Ability/Limitations of Caregiver: [name] works, Patent examiner can work from home but lives out of state, other family members work Careers adviser: Other (Comment)(see additional info below) Discharge Plan Discussed with Primary Caregiver: Yes Is Caregiver In Agreement with Plan?: Yes Does Caregiver/Family have Issues with Lodging/Transportation while Pt is in Rehab?: No   Goals/Additional Needs Patient/Family Goal for Rehab: PT/OT min/mod assist, SLP mod asisst Expected length of stay: 21-24 days Dietary Needs: NPO except chips, PEG tube Additional Information: Pt will most likely d/c to SNF for continued rehab following CIR; however, if he makes good progress family could potentially provide 24/7 assist short term.   Pt/Family Agrees to Admission and willing to participate: Yes Program Orientation Provided & Reviewed with Pt/Caregiver Including Roles  & Responsibilities: Yes  Barriers to Discharge: Decreased caregiver support, Home environment access/layout   Decrease burden of Care through IP rehab admission: Specialzed equipment needs, Diet advancement, Decrease number of caregivers, Bowel and bladder program and Patient/family education   Possible need for SNF placement upon discharge: Probably.  Pt and family aware that he will likely  still need significant assist at completion of CIR program.  If family unable to provide this will need SNF placement.    Patient  Condition: I have reviewed medical records from [facility], spoken with CM, and pt, significant other, son, daughter and other family members. I met with patient at the bedside and discussed via phone for inpatient rehabilitation assessment.  Patient will benefit from ongoing PT, OT and SLP, can actively participate in 3 hours of therapy a day 5 days of the week, and can make measurable gains during the admission.  Patient will also benefit from the coordinated team approach during an Inpatient Acute Rehabilitation admission.  The patient will receive intensive therapy as well as Rehabilitation physician, nursing, social worker, and care management interventions.  Due to bladder management, bowel management, safety, skin/wound care, disease management, medication administration, pain management and patient education the patient requires 24 hour a day rehabilitation nursing.  The patient is currently max to total +2 with mobility and basic ADLs.  Discharge setting and therapy post discharge at SNF/home is anticipated.  Patient has agreed to participate in the Acute Inpatient Rehabilitation Program and will admit today.   Preadmission Screen Completed By:  Michel Santee, 07/07/2019 10:17 AM ______________________________________________________________________   Discussed status with Dr. Posey Pronto on 07/07/19  at 10:51 AM  and received approval for admission today.   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 Does the need for close, 24 hr/day Medical supervision in concert with the patient's rehab needs make it unreasonable for this patient to be served in a less intensive setting? Yes   2. Co-Morbidities requiring supervision/potential complications:  Acute CHF with EF of 25-30%, acute onset pulmonary edema and aspiration PNA 2/2, CKD stage II, dysphagia s/p PEG placed  3. Due to bladder management, bowel management, safety, skin/wound care, disease management, medication administration, pain management and patient education, does the patient require 24 hr/day rehab nursing? Yes 4. Does the patient require coordinated care of a physician, rehab nurse, PT, OT, and SLP to address physical and functional deficits in the context of the above medical diagnosis(es)? Yes Addressing deficits in the following areas: balance, endurance, locomotion, strength, transferring, bowel/bladder control, bathing, dressing, feeding, grooming, toileting, cognition, speech, language, swallowing and psychosocial support 5. Can the patient actively participate in an intensive therapy program of at least 3 hrs of therapy 5 days a week? Potentially 6. The potential for patient to make measurable gains while on inpatient rehab is excellent 7. Anticipated functional outcomes upon discharge from inpatient rehab: mod assist and max assist PT, mod assist and max assist OT, min assist SLP 8. Estimated rehab length of stay to reach the above functional goals is: 27-32 days. 9. Anticipated discharge destination: Other 10. Overall Rehab/Functional Prognosis: fair     MD Signature: Delice Lesch, MD, ABPMR

## 2019-07-07 NOTE — Progress Notes (Signed)
Inpatient Rehab Admissions Coordinator:   I have a bed available and approval from Dr. Maylene Roes to admit pt to CIR today.  I will let pt/family and CM know.   Shann Medal, PT, DPT Admissions Coordinator [phone] 07/07/19  12:28 PM

## 2019-07-07 NOTE — TOC Transition Note (Signed)
Transition of Care [facility]) - CM/SW Discharge Note   Patient Details  Name: Levi Pruitt MRN: [id number] Date of Birth: 08-30-1945  Transition of Care [facility]) CM/SW Contact:  Pollie Friar, RN Phone Number: 07/07/2019, 1:16 PM   Clinical Narrative:    Pt discharging to CIR today. CM signing off.   Final next level of care: IP Rehab Facility Barriers to Discharge: No Barriers Identified   Patient Goals and CMS Choice     Choice offered to / list presented to : Patient  Discharge Placement                       Discharge Plan and Services                                     Social Determinants of Health (SDOH) Interventions     Readmission Risk Interventions No flowsheet data found.

## 2019-07-07 NOTE — Progress Notes (Signed)
Physical Therapy Treatment Patient Details Name: Levi Pruitt MRN: [id number] DOB: 12-29-1945 Today's Date: 07/07/2019    History of Present Illness Pt is a 74 yo male admitted 06/18/19 with a stroke due to embolism of L PCA on 06/18/19. 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 Since admission, pt has been dx with possible pneumonia, acute on chronic CHF, AKI, HLD, and inferior wall STEMI. Plan for PEG placement 1/26. No PMH on file.    PT Comments    Patient progressing with mobility although slowly, nice to see he tolerated well after PEG placement yesterday and unsuccessful in/out cath just prior to session.  Patient remains heavy +2 and still unable to stand fully.  Continues to need intensive skilled PT to allow decreased burden of care prior to d/c with assist.  PT to follow acutely.    Follow Up Recommendations  CIR;Supervision/Assistance - 24 hour     Equipment Recommendations  Other (comment)(TBA)    Recommendations for Other Services       Precautions / Restrictions Precautions Precautions: Fall Precaution Comments: R hemiparesis; inattention, incontinence    Mobility  Bed Mobility Overal bed mobility: Needs Assistance Bed Mobility: Rolling;Sidelying to Sit;Sit to Supine;Sit to Sidelying Rolling: Max assist;+2 for physical assistance Sidelying to sit: Max assist;+2 for safety/equipment     Sit to sidelying: Mod assist;+2 for physical assistance General bed mobility comments: assist to initiate rolling to L flexing R LE and placing R UE across his body, used pad to help him roll, then assisted to bring L leg off bed, guided R leg off, pt pushing through R elbow with mod A to come up to sit; to side cues for technique, he leaned onto L elbow and assist for R LE into bed, pt lifting L LE and guided back onto  bed  Transfers Overall transfer level: Needs assistance Equipment used: None Transfers: Sit to/from Stand Sit to Stand: +2 physical assistance;Total assist         General transfer comment: attempted x 2 from EOB with blocking R knee and able to achieve lift off with +2 total A but unable to get pt to engage to rise to stand  Ambulation/Gait                 Stairs             Wheelchair Mobility    Modified Rankin (Stroke Patients Only) Modified Rankin (Stroke Patients Only) Pre-Morbid Rankin Score: No symptoms Modified Rankin: Severe disability     Balance Overall balance assessment: Needs assistance Sitting-balance support: Feet supported;Single extremity supported;No upper extremity supported Sitting balance-Leahy Scale: Poor Sitting balance - Comments: min to mod A for sitting balance EOB, leaning to L elbow at times to engage cervical and L shoulder for turning to look at ceiling, then pt pushed up with L UE to sit and balanced about 20 sec with S.  mostly leaning back and to R Postural control: Posterior lean;Right lateral lean   Standing balance-Leahy Scale: Zero                              Cognition Arousal/Alertness: Awake/alert Behavior During Therapy: Flat affect Overall Cognitive Status: Impaired/Different from baseline Area of Impairment: Attention;Safety/judgement;Awareness;Problem solving  Current Attention Level: Sustained Memory: Decreased short-term memory Following Commands: Follows one step commands consistently;Follows one step commands with increased time Safety/Judgement: Decreased awareness of safety;Decreased awareness of deficits   Problem Solving: Slow processing;Requires verbal cues General Comments: awake and participative, but limited tolerance      Exercises Other Exercises Other Exercises: PROM RLE and UE, AAROM L LE    General Comments General comments (skin integrity, edema,  etc.): VSS, daughter present throughout      Pertinent Vitals/Pain Pain Assessment: Faces Faces Pain Scale: Hurts little more Pain Location: abdomen at PEG site and some due to unsuccessful i/o cath Pain Descriptors / Indicators: Discomfort;Grimacing Pain Intervention(s): Monitored during session;Repositioned;Limited activity within patient's tolerance    Home Living                      Prior Function            PT Goals (current goals can now be found in the care plan section) Progress towards PT goals: Progressing toward goals    Frequency    Min 4X/week      PT Plan Current plan remains appropriate    Co-evaluation              AM-PAC PT "6 Clicks" Mobility   Outcome Measure  Help needed turning from your back to your side while in a flat bed without using bedrails?: Total Help needed moving from lying on your back to sitting on the side of a flat bed without using bedrails?: A Lot Help needed moving to and from a bed to a chair (including a wheelchair)?: Total Help needed standing up from a chair using your arms (e.g., wheelchair or bedside chair)?: Total Help needed to walk in hospital room?: Total Help needed climbing 3-5 steps with a railing? : Total 6 Click Score: 7    End of Session Equipment Utilized During Treatment: Gait belt;Oxygen Activity Tolerance: Patient tolerated treatment well;Patient limited by fatigue Patient left: in bed;with call bell/phone within reach;with family/visitor present Nurse Communication: Mobility status PT Visit Diagnosis: Muscle weakness (generalized) (M62.81);Difficulty in walking, not elsewhere classified (R26.2);Unsteadiness on feet (R26.81);Other symptoms and signs involving the nervous system (R29.898);Hemiplegia and hemiparesis Hemiplegia - Right/Left: Right Hemiplegia - dominant/non-dominant: Dominant Hemiplegia - caused by: Cerebral infarction     Time: 1217-1241 PT Time Calculation (min) (ACUTE  ONLY): 24 min  Charges:  $Therapeutic Activity: 23-37 mins                     Levi Pruitt, [place] Acute Rehabilitation Services [phone] 07/07/2019    Reginia Naas 07/07/2019, 3:19 PM

## 2019-07-07 NOTE — H&P (Signed)
Physical Medicine and Rehabilitation Admission H&P    Chief Complaint  Patient presents with  . L-PCA stroke with functional decline.     HPI: Levi Pruitt is a 74 year old male in relatively good health (no medical care for 40+ years) who was admitted on 06/18/2019 after he did not show up for work" work-related EMS.  History taken chart review due to cognition.  He was found to have right facial droop, right hemiparesis, and dysarthria.  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  MRI brain revealed acute large left PCA infarct, small acute left frontal lobe infarct and chronic right temporal and right occipital lobe infarct.  Echocardiogram showed ejection fraction of 25-30% with severe decrease in LVEF, grade 3 diastolic dysfunction, akinesis of inferior, inferior lateral, apical and distal septal wall and severe LV dysfunction. He was started on IV Rocephin/Zithromax due to concerns of aspiration PNA on on 06/19/2019.  Cardiology consult for input on abnormal EKG and patient felt to have inferior wall STEMI. Patient not a candidate for intervention and medical management recommended with low-dose Coreg and ACE---Lisinopril DC'd due to soft blood pressures.  He was treated with IV heparin through 1/10 and has been transitioned to ASA/Plavix.  Repeat 2D echo of 1/15 showed improvement in EF 30 to 35% and Dr. Erlinda Hong recommends DAPT X 3 months followed by ASA alone.   He was kept n.p.o. due to lethargy and cortak placed by radiology on 06/22/2019.  He has had issues with fluid overload requiring intermittent diuresis.  Patient continued to have issues with lethargy with  difficulty handling secretions and palliative care consulted to discuss [place] as well as for input on PEG placement for nutritional support.  Family elected on full scope of care.  Follow-up chest x-ray 1/15 showed worsening of airspace opacities RUL and IV antibiotics added due to concerns of aspiration pneumonia.  As mentation improved MBS done on 1/18 and he was started on dysphagia 1 honey liquids.  He did have recurrent episode of hypoxia on 1/23 likely due to aspiration event and treated with Lasix for diuresis and continues to have ongoing supplemental oxygenation needs.[name]  He continued to have poor p.o. intake therefore PEG was placed on 07/06/2019 by Dr. Laurence Ferrari.  He has been incontinent of bladder and today found to have urinary retention with no urine output for > 18  Hrs--nurse unable to catheterize patient due to BPH.  Therapy ongoing and patient continues to be limited by dense right hemiplegia with right inattention and poor postural reflexes, decreased safety awareness with delayed processing, difficulty sequencing, oral apraxia, dysarthria with language of confusion and cognitive deficits.  Currently requiring +3 assist for pregait activity.  CIR recommended due to functional decline.  Please see preadmission assessment from earlier today as well.   Review of Systems  Unable to perform ROS: Mental acuity    Past Medical History:  Diagnosis Date  . 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 IR GASTROSTOMY TUBE MOD SED  07/06/2019    Family History  Problem Relation Age of Onset  . Heart disease Father   . High  blood pressure Sister        is his twin     Social History:  Single. 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 Daughter lives in [place] and working remotely. Son lives in [place] and works full time. Has had a girlfriend for 20 years but live apart (she's lives in [place] and they  see each other once a week). He does not use tobacco, smokeless tobacco, ETOH or drugs.  Allergies: No Known Allergies    Medications Prior to Admission  Medication Sig Dispense Refill  . Cholecalciferol ([employer] VITAMIN D3) 250 MCG (10000 UT) CAPS Take 1 capsule by mouth daily.    . vitamin C (ASCORBIC ACID) 250 MG tablet Take 250 mg by mouth daily.      Drug Regimen Review  Drug regimen was reviewed and remains appropriate with no significant issues identified  Home: Home Living Family/patient expects to be discharged to:: Private residence Living Arrangements: Alone Available Help at Discharge: Family, Friend(s) Type of Home: House Home Access: Stairs to enter [employer] of Steps: couple  Entrance Stairs-Rails: Right, Left Home Layout: One level Bathroom Shower/Tub: Chiropodist: Standard Home Equipment: None('walking sticks') Additional Comments: daughter lives in [place], long time girlfriend works but available PRN   Lives With: Alone   Functional History: Prior Function Level of Independence: Independent Comments: independent, driving, working Biochemist, clinical)   Functional Status:  Mobility: Bed Mobility Overal bed mobility: Needs Assistance Bed Mobility: Rolling, Sidelying to Sit Rolling: Max assist, +2 for physical assistance Sidelying to sit: Max assist, Total assist, +2 for physical assistance Supine to sit: Max assist, +2 for physical assistance Sit to supine: Total assist, +2 for physical assistance General bed mobility comments: patient able to assist with rolling using L UE/LE given cueing; overall requiring max-total assist +2 due to sequencing, motor planning and initation (as well as weakness and impaired balance)  Transfers Overall transfer level: Needs assistance Equipment used: 2 person hand held assist Transfer via Lift Equipment: Maximove Transfers: Lateral/Scoot Transfers Sit to Stand: Max assist, +2 physical assistance   Lateral/Scoot Transfers: Total assist, +2 physical assistance, +2 safety/equipment(3rd person in back due to weight) General transfer comment: pt with little initiation and voluntary effort, transfered to the L  Ambulation/Gait General Gait Details: unable    ADL: ADL Overall ADL's : Needs assistance/impaired Grooming: Maximal assistance, Sitting, Wash/dry hands, Brushing hair Grooming Details (indicate cue type and reason): seated in recliner, able to wash hands with inital hand over hand assist but sustained attention to wash R UE partially given multimodal cueing; min assist to comb hair with L UE for thoroughness  Toileting- Clothing Manipulation and Hygiene: Total assistance, +2 for safety/equipment, +2 for physical assistance, Bed level Toileting - Clothing Manipulation Details (indicate cue type and reason): total assist for bed level hygiene  Functional mobility during ADLs: Total assistance, +2 for physical assistance, +2 for safety/equipment General ADL Comments: requires max-total assist for all self care  Cognition: Cognition Overall Cognitive Status: Impaired/Different from baseline Arousal/Alertness: Lethargic Orientation Level: Oriented to person Comments: Difficult to assess due to lethargy and language deficits  Cognition Arousal/Alertness: Lethargic Behavior During Therapy: Flat affect Overall Cognitive Status: Impaired/Different from baseline Area of Impairment: Attention, Following commands, Safety/judgement, Awareness, Problem solving, Orientation, Memory Orientation Level: Time, Situation, Place Current Attention Level: Focused Memory: Decreased short-term memory Following Commands: Follows one step commands with increased time, Follows one step commands inconsistently Safety/Judgement: Decreased awareness of safety, Decreased awareness of deficits(R sided  neglect) Awareness: Intellectual Problem Solving: Slow processing, Difficulty sequencing, Requires verbal  cues, Requires tactile cues, Decreased initiation General Comments: Pt more lethargic/sleepy today, harder to keep attention, less initiation and ability to stay awake to actively participate Difficult to assess due to: Impaired communication(expressive difficulties)   Blood pressure 119/72, pulse 61, temperature 97.7 F (36.5 C), temperature source Oral, resp. rate (!) 24, height 5\' 9"  (1.753 m), weight 92.7 kg, SpO2 95 %. Physical Exam  Nursing note and vitals reviewed. Constitutional: He appears well-developed and well-nourished. No distress. Nasal cannula in place.  HENT:  Head: Normocephalic and atraumatic.  Eyes: Conjunctivae are normal. Right eye exhibits no discharge. Left eye exhibits no discharge.  Left gaze preference  Neck: No tracheal deviation present. No thyromegaly present.  Cardiovascular: Normal rate.  Respiratory: Effort normal. No stridor. No respiratory distress.  + [place].  GI: Soft. He exhibits no distension. There is no abdominal tenderness.  PEG site with dry dressing.   Musculoskeletal:        General: Edema present.     Comments: Mild edema RUE/RLE.   Neurological: He is alert.  Oriented to self only.  Dysarthria and language of confusion noted.  Motor: Limited due to participation, however dense right hemiparesis noted LUE/LLE:?  3+-4 -/5 proximal distal.  Repeating "Uh-huh".  Skin: Skin is warm and dry. He is not diaphoretic.  Bilateral abrasions to knees  Psychiatric:  Flat affect and appears disengaged.     Results for orders placed or performed during the hospital encounter of 06/18/19 (from the past 48 hour(s))  Glucose, capillary     Status: Abnormal   Collection Time: 07/05/19  3:31 PM  Result Value Ref Range   Glucose-Capillary 112 (H) 70 - 99 mg/dL  Glucose, capillary     Status: Abnormal   Collection Time: 07/05/19  7:14 PM  Result Value Ref Range   Glucose-Capillary 119 (H) 70 - 99 mg/dL  Glucose, capillary     Status: Abnormal    Collection Time: 07/05/19 11:21 PM  Result Value Ref Range   Glucose-Capillary 111 (H) 70 - 99 mg/dL  Glucose, capillary     Status: Abnormal   Collection Time: 07/06/19  4:07 AM  Result Value Ref Range   Glucose-Capillary 105 (H) 70 - 99 mg/dL  CBC     Status: Abnormal   Collection Time: 07/06/19  7:47 AM  Result Value Ref Range   WBC 14.8 (H) 4.0 - 10.5 K/uL   RBC 4.60 4.22 - 5.81 MIL/uL   Hemoglobin 14.2 13.0 - 17.0 g/dL   HCT 43.2 39.0 - 52.0 %   MCV 93.9 80.0 - 100.0 fL   MCH 30.9 26.0 - 34.0 pg   MCHC 32.9 30.0 - 36.0 g/dL   RDW 13.3 11.5 - 15.5 %   Platelets 279 150 - 400 K/uL   nRBC 0.0 0.0 - 0.2 %    Comment: Performed at [facility], [place] [address], [place], [place] [address]  Protime-INR     Status: None   Collection Time: 07/06/19  7:47 AM  Result Value Ref Range   Prothrombin Time 13.4 11.4 - 15.2 seconds   INR 1.0 0.8 - 1.2    Comment: (NOTE) INR goal varies based on device and disease states. Performed at [facility], [place] [address], [place], [place] [address]   Renal function panel     Status: Abnormal   Collection Time: 07/06/19  7:47 AM  Result Value Ref Range  Sodium 138 135 - 145 mmol/L   Potassium 3.8 3.5 - 5.1 mmol/L   Chloride 104 98 - 111 mmol/L   CO2 23 22 - 32 mmol/L   Glucose, Bld 107 (H) 70 - 99 mg/dL   BUN 14 8 - 23 mg/dL   Creatinine, Ser 1.02 0.61 - 1.24 mg/dL   Calcium 8.4 (L) 8.9 - 10.3 mg/dL   Phosphorus 3.8 2.5 - 4.6 mg/dL   Albumin 2.3 (L) 3.5 - 5.0 g/dL   GFR calc non Af Amer >60 >60 mL/min   GFR calc Af Amer >60 >60 mL/min   Anion gap 11 5 - 15    Comment: Performed at [facility] [address], [place], [place] [address]  Glucose, capillary     Status: Abnormal   Collection Time: 07/06/19  8:19 AM  Result Value Ref Range   Glucose-Capillary 104 (H) 70 - 99 mg/dL  Glucose, capillary     Status: Abnormal   Collection Time: 07/06/19 11:56 AM  Result Value Ref Range   Glucose-Capillary 101 (H) 70 - 99  mg/dL  Glucose, capillary     Status: Abnormal   Collection Time: 07/06/19  3:44 PM  Result Value Ref Range   Glucose-Capillary 140 (H) 70 - 99 mg/dL  Glucose, capillary     Status: None   Collection Time: 07/06/19  7:21 PM  Result Value Ref Range   Glucose-Capillary 73 70 - 99 mg/dL  Glucose, capillary     Status: None   Collection Time: 07/06/19 11:10 PM  Result Value Ref Range   Glucose-Capillary 92 70 - 99 mg/dL  CBC with Differential/Platelet     Status: Abnormal   Collection Time: 07/07/19  2:15 AM  Result Value Ref Range   WBC 11.9 (H) 4.0 - 10.5 K/uL   RBC 4.67 4.22 - 5.81 MIL/uL   Hemoglobin 14.4 13.0 - 17.0 g/dL   HCT 45.1 39.0 - 52.0 %   MCV 96.6 80.0 - 100.0 fL   MCH 30.8 26.0 - 34.0 pg   MCHC 31.9 30.0 - 36.0 g/dL   RDW 13.5 11.5 - 15.5 %   Platelets 281 150 - 400 K/uL   nRBC 0.0 0.0 - 0.2 %   Neutrophils Relative % 69 %   Neutro Abs 8.3 (H) 1.7 - 7.7 K/uL   Lymphocytes Relative 13 %   Lymphs Abs 1.5 0.7 - 4.0 K/uL   Monocytes Relative 14 %   Monocytes Absolute 1.7 (H) 0.1 - 1.0 K/uL   Eosinophils Relative 2 %   Eosinophils Absolute 0.2 0.0 - 0.5 K/uL   Basophils Relative 1 %   Basophils Absolute 0.1 0.0 - 0.1 K/uL   Immature Granulocytes 1 %   Abs Immature Granulocytes 0.08 (H) 0.00 - 0.07 K/uL    Comment: Performed at [facility], [address], [place], [place] [address]  Magnesium     Status: None   Collection Time: 07/07/19  2:15 AM  Result Value Ref Range   Magnesium 1.9 1.7 - 2.4 mg/dL    Comment: Performed at [facility] [address] [place], [place] [address]  Renal function panel     Status: Abnormal   Collection Time: 07/07/19  2:15 AM  Result Value Ref Range   Sodium 140 135 - 145 mmol/L   Potassium 3.5 3.5 - 5.1 mmol/L   Chloride 102 98 - 111 mmol/L   CO2 27 22 - 32 mmol/L   Glucose, Bld  85 70 - 99 mg/dL   BUN 12 8 - 23 mg/dL   Creatinine, Ser 1.10 0.61 - 1.24 mg/dL   Calcium 8.3 (L) 8.9 - 10.3 mg/dL   Phosphorus 3.7  2.5 - 4.6 mg/dL   Albumin 2.2 (L) 3.5 - 5.0 g/dL   GFR calc non Af Amer >60 >60 mL/min   GFR calc Af Amer >60 >60 mL/min   Anion gap 11 5 - 15    Comment: Performed at [facility] [address], [place], [place] [address]  Glucose, capillary     Status: None   Collection Time: 07/07/19  3:11 AM  Result Value Ref Range   Glucose-Capillary 91 70 - 99 mg/dL  Glucose, capillary     Status: Abnormal   Collection Time: 07/07/19  7:54 AM  Result Value Ref Range   Glucose-Capillary 101 (H) 70 - 99 mg/dL  Glucose, capillary     Status: Abnormal   Collection Time: 07/07/19 12:48 PM  Result Value Ref Range   Glucose-Capillary 107 (H) 70 - 99 mg/dL   IR GASTROSTOMY TUBE MOD SED  Result Date: 07/06/2019 INDICATION: 74 year old male with dysphagia following cerebrovascular accident. EXAM: Fluoroscopically guided placement of percutaneous pull-through gastrostomy tube Interventional Radiologist:  Criselda Peaches, MD MEDICATIONS: 2 g Ancef, 1 mg glucagon; Antibiotics were administered within 1 hour of the procedure. ANESTHESIA/SEDATION: Versed 1.5 mg IV; Fentanyl 75 mcg IV Moderate Sedation Time:  8 minutes The patient was continuously monitored during the procedure by the interventional radiology nurse under my direct supervision. CONTRAST:  87mL OMNIPAQUE IOHEXOL 300 MG/ML  SOLN FLUOROSCOPY TIME:  Fluoroscopy Time: 4 minutes 54 seconds (17 mGy). COMPLICATIONS: None immediate. PROCEDURE: Informed written consent was obtained from the patient after a thorough discussion of the procedural risks, benefits and alternatives. All questions were addressed. Maximal Sterile Barrier Technique was utilized including caps, mask, sterile gowns, sterile gloves, sterile drape, hand hygiene and skin antiseptic. A timeout was performed prior to the initiation of the procedure. Maximal barrier sterile technique utilized including caps, mask, sterile gowns, sterile gloves, large sterile drape, hand hygiene, and  chlorhexadine skin prep. An angled catheter was advanced over a wire under fluoroscopic guidance through the nose, down the esophagus and into the body of the stomach. The stomach was then insufflated with several 100 ml of air. Fluoroscopy confirmed location of the gastric bubble, as well as inferior displacement of the barium stained colon. Under direct fluoroscopic guidance, a single T-tack was placed, and the anterior gastric wall drawn up against the anterior abdominal wall. Percutaneous access was then obtained into the mid gastric body with an 18 gauge sheath needle. Aspiration of air, and injection of contrast material under fluoroscopy confirmed needle placement. An Amplatz wire was advanced in the gastric body and the access needle exchanged for a 9-French vascular sheath. A snare device was advanced through the vascular sheath and an Amplatz wire advanced through the angled catheter. The Amplatz wire was successfully snared and this was pulled up through the esophagus and out the mouth. A 20-French Alinda Dooms MIC-PEG tube was then connected to the snare and pulled through the mouth, down the esophagus, into the stomach and out to the anterior abdominal wall. Hand injection of contrast material confirmed intragastric location. The T-tack retention suture was then cut. The pull through peg tube was then secured with the external bumper and capped. The patient will be observed for several hours with the newly placed tube on low wall suction  to evaluate for any post procedure complication. The patient tolerated the procedure well, there is no immediate complication. IMPRESSION: Successful placement of a 20 French pull through gastrostomy tube. Electronically Signed   By: Jacqulynn Cadet M.D.   On: 07/06/2019 18:43       Medical Problem List and Plan: 1.  Dense right hemiplegia with right inattention and poor postural reflexes, decreased safety awareness with delayed processing, difficulty  sequencing, oral apraxia, dysarthria with language of confusion and cognitive deficits secondary to L PCA CVA and chronic R temporal and occipital lobe infarcts with small acute L frontal lobe infarct  -patient may shower  -ELOS/Goals: 27-32 days/mod/max A  Admit to CIR 2.  Antithrombotics: -DVT/anticoagulation:  Pharmaceutical: Heparin  -antiplatelet therapy: DAPT X 3 months followed by ASA alone.   3. Pain Management:  Tylenol prn.  4. Mood: LCSW to follow for evaluation and support when appropriate.   -antipsychotic agents: N/A 5. Neuropsych: This patient is not capable of making decisions on his own behalf. 6. Skin/Wound Care: Routine pressure relief measures. Needs air mattress as unable to position himself.  7. Fluids/Electrolytes/Nutrition: Monitor I/O.  CMP ordered for tomorrow a.m. 8. Aspiration PNA: Leucocytosis resolving. Completed 2 week course of Rocephin 1/22 and Zithromax X 5 days. Oral candida treated with diflucan X & days. 9. Severe post stroke dysphagia: PEG placed on 01/26--Tube feeds being slowly advanced every 4 hours to prevent refeeding syndrome.   Advance diet as tolerated 10 Malnutrition: At risk for refeeding syndrome--need to check  Mg  Phos and K daily X 2  Labs ordered for tomorrow 11. Acute on chronic CHF: Monitor for signs and symptoms of fluid overload.  Blood pressures remain soft--SBP 90's-110.    Daily weights 12. Inferior STEMI: On Lipitor, Coreg BID 13. Urinary retention: Has not voided for since > 18 hours. Needs PVRs as well as caths for volumes > 350cc.   UA/urine culture ordered 14. Hypoxia: Continues to drop to 80's requires deep suctioning and 2 liters per [place].  Discontinue IVF.   Chest x-ray ordered as showing signs of fluid overload 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   Bary Leriche, PA-C 07/07/2019  I have personally  performed a face to face diagnostic evaluation, including, but not limited to relevant history and physical exam findings, of this patient and developed relevant assessment and plan.  Additionally, I have reviewed and concur with the physician assistant's documentation above.  Delice Lesch, MD, ABPMR  The patient's status has not changed. The original post admission physician evaluation remains appropriate, and any changes from the pre-admission screening or documentation from the acute chart are noted above.   Delice Lesch, MD, ABPMR

## 2019-07-07 NOTE — Progress Notes (Signed)
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 Laurence Ferrari. Patient awake and alert laying in bed. Speech dysarthric. No complaints. Gastrostomy tube site c/d/i.   Allergies: Patient has no known allergies.  Medications: Prior to Admission medications   Medication Sig Start Date End Date Taking? Authorizing Provider  Cholecalciferol ([employer] VITAMIN D3) 250 MCG (10000 UT) CAPS Take 1 capsule by mouth daily.   Yes [provider]  vitamin C (ASCORBIC ACID) 250 MG tablet Take 250 mg by mouth daily.   Yes [provider]     Vital Signs: BP 106/64 (BP Location: Right Arm)   Pulse 60   Temp (!) 97.5 F (36.4 C) (Axillary)   Resp (!) 28   Ht 5\' 9"  (1.753 m)   Wt 204 lb 4.8 oz (92.7 kg)   SpO2 97%   BMI 30.17 kg/m   Physical Exam Vitals and nursing note reviewed.  Constitutional:      General: He is not in acute distress.    Appearance: Normal appearance.  Pulmonary:     Effort: Pulmonary effort is normal. No respiratory distress.  Abdominal:     Comments: Gastrostomy tube site without tenderness, drainage, or active bleeding; bumper appears tight against skin causing indentation/erythema- this was loosened slightly to ensure that bumper is cinched to skin but not too tight to cause indentation.  Skin:    General: Skin is warm and dry.  Neurological:     Mental Status: He is alert and oriented to person, place, and time.     Comments: Speech dysarthric.     Imaging: IR GASTROSTOMY TUBE MOD SED  Result Date: 07/06/2019 INDICATION: 74 year old male with dysphagia following cerebrovascular accident. EXAM: Fluoroscopically guided placement of percutaneous pull-through gastrostomy tube Interventional Radiologist:  Criselda Peaches, MD MEDICATIONS: 2 g Ancef, 1  mg glucagon; Antibiotics were administered within 1 hour of the procedure. ANESTHESIA/SEDATION: Versed 1.5 mg IV; Fentanyl 75 mcg IV Moderate Sedation Time:  8 minutes The patient was continuously monitored during the procedure by the interventional radiology nurse under my direct supervision. CONTRAST:  54mL OMNIPAQUE IOHEXOL 300 MG/ML  SOLN FLUOROSCOPY TIME:  Fluoroscopy Time: 4 minutes 54 seconds (17 mGy). COMPLICATIONS: None immediate. PROCEDURE: Informed written consent was obtained from the patient after a thorough discussion of the procedural risks, benefits and alternatives. All questions were addressed. Maximal Sterile Barrier Technique was utilized including caps, mask, sterile gowns, sterile gloves, sterile drape, hand hygiene and skin antiseptic. A timeout was performed prior to the initiation of the procedure. Maximal barrier sterile technique utilized including caps, mask, sterile gowns, sterile gloves, large sterile drape, hand hygiene, and chlorhexadine skin prep. An angled catheter was advanced over a wire under fluoroscopic guidance through the nose, down the esophagus and into the body of the stomach. The stomach was then insufflated with several 100 ml of air. Fluoroscopy confirmed location of the gastric bubble, as well as inferior displacement of the barium stained colon. Under direct fluoroscopic guidance, a single T-tack was placed, and the anterior gastric wall drawn up against the anterior abdominal wall. Percutaneous access was then obtained into the mid gastric body with an 18 gauge sheath needle. Aspiration of air, and injection of contrast material under fluoroscopy confirmed needle placement. An Amplatz wire was advanced in the gastric body and the access needle  exchanged for a 9-French vascular sheath. A snare device was advanced through the vascular sheath and an Amplatz wire advanced through the angled catheter. The Amplatz wire was successfully snared and this was pulled up through  the esophagus and out the mouth. A 20-French Alinda Dooms MIC-PEG tube was then connected to the snare and pulled through the mouth, down the esophagus, into the stomach and out to the anterior abdominal wall. Hand injection of contrast material confirmed intragastric location. The T-tack retention suture was then cut. The pull through peg tube was then secured with the external bumper and capped. The patient will be observed for several hours with the newly placed tube on low wall suction to evaluate for any post procedure complication. The patient tolerated the procedure well, there is no immediate complication. IMPRESSION: Successful placement of a 20 French pull through gastrostomy tube. Electronically Signed   By: Jacqulynn Cadet M.D.   On: 07/06/2019 18:43    Labs:  CBC: Recent Labs    07/04/19 1048 07/05/19 0341 07/06/19 0747 07/07/19 0215  WBC 13.2* 15.0* 14.8* 11.9*  HGB 13.1 14.0 14.2 14.4  HCT 40.7 43.9 43.2 45.1  PLT 262 297 279 281    COAGS: Recent Labs    06/18/19 1439 07/06/19 0747  INR 1.1 1.0  APTT 26  --     BMP: Recent Labs    07/04/19 1048 07/05/19 0341 07/06/19 0747 07/07/19 0215  NA 139 139 138 140  K 3.5 3.3* 3.8 3.5  CL 102 100 104 102  CO2 26 26 23 27   GLUCOSE 100* 97 107* 85  BUN 18 17 14 12   CALCIUM 8.1* 8.4* 8.4* 8.3*  CREATININE 1.10 1.10 1.02 1.10  GFRNONAA >60 >60 >60 >60  GFRAA >60 >60 >60 >60    LIVER FUNCTION TESTS: Recent Labs    06/18/19 1439 06/18/19 1439 06/19/19 0422 06/21/19 0246 06/28/19 0753 07/01/19 0253 07/04/19 1048 07/05/19 0341 07/06/19 0747 07/07/19 0215  BILITOT 0.9  --  1.1  --  1.1  --   --   --   --   --   AST 73*  --  63*  --  37  --   --   --   --   --   ALT 60*  --  59*  --  38  --   --   --   --   --   ALKPHOS 69  --  68  --  80  --   --   --   --   --   PROT 6.7  --  6.6  --  5.3*  --   --   --   --   --   ALBUMIN 2.9*   < > 2.7*   < > 2.2*   < > 2.1* 2.2* 2.3* 2.2*   < > = values in this  interval not displayed.    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 Laurence Ferrari. Gastrostomy tube stable- tube is ready for use. Further plans per [facility]/neurology- appreciate and agree with management.  Please call IR with questions/concerns.   Electronically Signed: Earley Abide, PA-C 07/07/2019, 10:45 AM   I spent a total of 25 Minutes at the the patient's bedside AND on the patient's hospital floor or unit, greater than 50% of which was counseling/coordinating care for dysphagia s/p gastrostomy tube placement.

## 2019-07-07 NOTE — Progress Notes (Signed)
RN gave report to CIR RN.

## 2019-07-07 NOTE — H&P (Signed)
Physical Medicine and Rehabilitation Admission H&P    Chief Complaint  Patient presents with  . L-PCA stroke with functional decline.     HPI: Levi Pruitt is a 74 year old male in relatively good health (no medical care for 40+ years) who was admitted on 06/18/2019 after he did not show up for work" work-related EMS.  History taken chart review due to cognition.  He was found to have right facial droop, right hemiparesis, and dysarthria.  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  MRI brain revealed acute large left PCA infarct, small acute left frontal lobe infarct and chronic right temporal and right occipital lobe infarct.  Echocardiogram showed ejection fraction of 25-30% with severe decrease in LVEF, grade 3 diastolic dysfunction, akinesis of inferior, inferior lateral, apical and distal septal wall and severe LV dysfunction. He was started on IV Rocephin/Zithromax due to concerns of aspiration PNA on on 06/19/2019.  Cardiology consult for input on abnormal EKG and patient felt to have inferior wall STEMI. Patient not a candidate for intervention and medical management recommended with low-dose Coreg and ACE---Lisinopril DC'd due to soft blood pressures.  He was treated with IV heparin through 1/10 and has been transitioned to ASA/Plavix.  Repeat 2D echo of 1/15 showed improvement in EF 30 to 35% and Dr. Erlinda Hong recommends DAPT X 3 months followed by ASA alone.   He was kept n.p.o. due to lethargy and cortak placed by radiology on 06/22/2019.  He has had issues with fluid overload requiring intermittent diuresis.  Patient continued to have issues with lethargy with  difficulty handling secretions and palliative care consulted to discuss Bowles as well as for input on PEG placement for nutritional support.  Family elected on full scope of care.  Follow-up chest x-ray 1/15 showed worsening of airspace opacities RUL and IV antibiotics added due to concerns of aspiration pneumonia.  As mentation improved MBS done on 1/18 and he was started on dysphagia 1 honey liquids.  He did have recurrent episode of hypoxia on 1/23 likely due to aspiration event and treated with Lasix for diuresis and continues to have ongoing supplemental oxygenation needs.[name]  He continued to have poor p.o. intake therefore PEG was placed on 07/06/2019 by Dr. Laurence Ferrari.  He has been incontinent of bladder and today found to have urinary retention with no urine output for > 18  Hrs--nurse unable to catheterize patient due to BPH.  Therapy ongoing and patient continues to be limited by dense right hemiplegia with right inattention and poor postural reflexes, decreased safety awareness with delayed processing, difficulty sequencing, oral apraxia, dysarthria with language of confusion and cognitive deficits.  Currently requiring +3 assist for pregait activity.  CIR recommended due to functional decline.  Please see preadmission assessment from earlier today as well.   Review of Systems  Unable to perform ROS: Mental acuity    Past Medical History:  Diagnosis Date  . 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 IR GASTROSTOMY TUBE MOD SED  07/06/2019    Family History  Problem Relation Age of Onset  . Heart disease Father   . High  blood pressure Sister        is his twin     Social History:  Single. 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 Daughter lives in [place] and working remotely. Son lives in [place] and works full time. Has had a girlfriend for 20 years but live apart (she's lives in [place] and they  see each other once a week). He does not use tobacco, smokeless tobacco, ETOH or drugs.  Allergies: No Known Allergies    Medications Prior to Admission  Medication Sig Dispense Refill  . Cholecalciferol ([employer] VITAMIN D3) 250 MCG (10000 UT) CAPS Take 1 capsule by mouth daily.    . vitamin C (ASCORBIC ACID) 250 MG tablet Take 250 mg by mouth daily.      Drug Regimen Review  Drug regimen was reviewed and remains appropriate with no significant issues identified  Home: Home Living Family/patient expects to be discharged to:: Private residence Living Arrangements: Alone Available Help at Discharge: Family, Friend(s) Type of Home: House Home Access: Stairs to enter [employer] of Steps: couple  Entrance Stairs-Rails: Right, Left Home Layout: One level Bathroom Shower/Tub: Chiropodist: Standard Home Equipment: None('walking sticks') Additional Comments: daughter lives in [place], long time girlfriend works but available PRN   Lives With: Alone   Functional History: Prior Function Level of Independence: Independent Comments: independent, driving, working Biochemist, clinical)   Functional Status:  Mobility: Bed Mobility Overal bed mobility: Needs Assistance Bed Mobility: Rolling, Sidelying to Sit Rolling: Max assist, +2 for physical assistance Sidelying to sit: Max assist, Total assist, +2 for physical assistance Supine to sit: Max assist, +2 for physical assistance Sit to supine: Total assist, +2 for physical assistance General bed mobility comments: patient able to assist with rolling using L UE/LE given cueing; overall requiring max-total assist +2 due to sequencing, motor planning and initation (as well as weakness and impaired balance)  Transfers Overall transfer level: Needs assistance Equipment used: 2 person hand held assist Transfer via Lift Equipment: Maximove Transfers: Lateral/Scoot Transfers Sit to Stand: Max assist, +2 physical assistance   Lateral/Scoot Transfers: Total assist, +2 physical assistance, +2 safety/equipment(3rd person in back due to weight) General transfer comment: pt with little initiation and voluntary effort, transfered to the L  Ambulation/Gait General Gait Details: unable    ADL: ADL Overall ADL's : Needs assistance/impaired Grooming: Maximal assistance, Sitting, Wash/dry hands, Brushing hair Grooming Details (indicate cue type and reason): seated in recliner, able to wash hands with inital hand over hand assist but sustained attention to wash R UE partially given multimodal cueing; min assist to comb hair with L UE for thoroughness  Toileting- Clothing Manipulation and Hygiene: Total assistance, +2 for safety/equipment, +2 for physical assistance, Bed level Toileting - Clothing Manipulation Details (indicate cue type and reason): total assist for bed level hygiene  Functional mobility during ADLs: Total assistance, +2 for physical assistance, +2 for safety/equipment General ADL Comments: requires max-total assist for all self care  Cognition: Cognition Overall Cognitive Status: Impaired/Different from baseline Arousal/Alertness: Lethargic Orientation Level: Oriented to person Comments: Difficult to assess due to lethargy and language deficits  Cognition Arousal/Alertness: Lethargic Behavior During Therapy: Flat affect Overall Cognitive Status: Impaired/Different from baseline Area of Impairment: Attention, Following commands, Safety/judgement, Awareness, Problem solving, Orientation, Memory Orientation Level: Time, Situation, Place Current Attention Level: Focused Memory: Decreased short-term memory Following Commands: Follows one step commands with increased time, Follows one step commands inconsistently Safety/Judgement: Decreased awareness of safety, Decreased awareness of deficits(R sided  neglect) Awareness: Intellectual Problem Solving: Slow processing, Difficulty sequencing, Requires verbal  cues, Requires tactile cues, Decreased initiation General Comments: Pt more lethargic/sleepy today, harder to keep attention, less initiation and ability to stay awake to actively participate Difficult to assess due to: Impaired communication(expressive difficulties)   Blood pressure 119/72, pulse 61, temperature 97.7 F (36.5 C), temperature source Oral, resp. rate (!) 24, height 5\' 9"  (1.753 m), weight 92.7 kg, SpO2 95 %. Physical Exam  Nursing note and vitals reviewed. Constitutional: He appears well-developed and well-nourished. No distress. Nasal cannula in place.  HENT:  Head: Normocephalic and atraumatic.  Eyes: Conjunctivae are normal. Right eye exhibits no discharge. Left eye exhibits no discharge.  Left gaze preference  Neck: No tracheal deviation present. No thyromegaly present.  Cardiovascular: Normal rate.  Respiratory: Effort normal. No stridor. No respiratory distress.  + Dale.  GI: Soft. He exhibits no distension. There is no abdominal tenderness.  PEG site with dry dressing.   Musculoskeletal:        General: Edema present.     Comments: Mild edema RUE/RLE.   Neurological: He is alert.  Oriented to self only.  Dysarthria and language of confusion noted.  Motor: Limited due to participation, however dense right hemiparesis noted LUE/LLE:?  3+-4 -/5 proximal distal.  Repeating "Uh-huh".  Skin: Skin is warm and dry. He is not diaphoretic.  Bilateral abrasions to knees  Psychiatric:  Flat affect and appears disengaged.     Results for orders placed or performed during the hospital encounter of 06/18/19 (from the past 48 hour(s))  Glucose, capillary     Status: Abnormal   Collection Time: 07/05/19  3:31 PM  Result Value Ref Range   Glucose-Capillary 112 (H) 70 - 99 mg/dL  Glucose, capillary     Status: Abnormal   Collection Time: 07/05/19  7:14 PM  Result Value Ref Range   Glucose-Capillary 119 (H) 70 - 99 mg/dL  Glucose, capillary     Status: Abnormal    Collection Time: 07/05/19 11:21 PM  Result Value Ref Range   Glucose-Capillary 111 (H) 70 - 99 mg/dL  Glucose, capillary     Status: Abnormal   Collection Time: 07/06/19  4:07 AM  Result Value Ref Range   Glucose-Capillary 105 (H) 70 - 99 mg/dL  CBC     Status: Abnormal   Collection Time: 07/06/19  7:47 AM  Result Value Ref Range   WBC 14.8 (H) 4.0 - 10.5 K/uL   RBC 4.60 4.22 - 5.81 MIL/uL   Hemoglobin 14.2 13.0 - 17.0 g/dL   HCT 43.2 39.0 - 52.0 %   MCV 93.9 80.0 - 100.0 fL   MCH 30.9 26.0 - 34.0 pg   MCHC 32.9 30.0 - 36.0 g/dL   RDW 13.3 11.5 - 15.5 %   Platelets 279 150 - 400 K/uL   nRBC 0.0 0.0 - 0.2 %    Comment: Performed at [facility], [place] [address], [place], [place] [address]  Protime-INR     Status: None   Collection Time: 07/06/19  7:47 AM  Result Value Ref Range   Prothrombin Time 13.4 11.4 - 15.2 seconds   INR 1.0 0.8 - 1.2    Comment: (NOTE) INR goal varies based on device and disease states. Performed at [facility], [place] [address] [place], [place] [address]   Renal function panel     Status: Abnormal   Collection Time: 07/06/19  7:47 AM  Result Value Ref Range  Sodium 138 135 - 145 mmol/L   Potassium 3.8 3.5 - 5.1 mmol/L   Chloride 104 98 - 111 mmol/L   CO2 23 22 - 32 mmol/L   Glucose, Bld 107 (H) 70 - 99 mg/dL   BUN 14 8 - 23 mg/dL   Creatinine, Ser 1.02 0.61 - 1.24 mg/dL   Calcium 8.4 (L) 8.9 - 10.3 mg/dL   Phosphorus 3.8 2.5 - 4.6 mg/dL   Albumin 2.3 (L) 3.5 - 5.0 g/dL   GFR calc non Af Amer >60 >60 mL/min   GFR calc Af Amer >60 >60 mL/min   Anion gap 11 5 - 15    Comment: Performed at [facility] [address] [place], [place] [address]  Glucose, capillary     Status: Abnormal   Collection Time: 07/06/19  8:19 AM  Result Value Ref Range   Glucose-Capillary 104 (H) 70 - 99 mg/dL  Glucose, capillary     Status: Abnormal   Collection Time: 07/06/19 11:56 AM  Result Value Ref Range   Glucose-Capillary 101 (H) 70 - 99  mg/dL  Glucose, capillary     Status: Abnormal   Collection Time: 07/06/19  3:44 PM  Result Value Ref Range   Glucose-Capillary 140 (H) 70 - 99 mg/dL  Glucose, capillary     Status: None   Collection Time: 07/06/19  7:21 PM  Result Value Ref Range   Glucose-Capillary 73 70 - 99 mg/dL  Glucose, capillary     Status: None   Collection Time: 07/06/19 11:10 PM  Result Value Ref Range   Glucose-Capillary 92 70 - 99 mg/dL  CBC with Differential/Platelet     Status: Abnormal   Collection Time: 07/07/19  2:15 AM  Result Value Ref Range   WBC 11.9 (H) 4.0 - 10.5 K/uL   RBC 4.67 4.22 - 5.81 MIL/uL   Hemoglobin 14.4 13.0 - 17.0 g/dL   HCT 45.1 39.0 - 52.0 %   MCV 96.6 80.0 - 100.0 fL   MCH 30.8 26.0 - 34.0 pg   MCHC 31.9 30.0 - 36.0 g/dL   RDW 13.5 11.5 - 15.5 %   Platelets 281 150 - 400 K/uL   nRBC 0.0 0.0 - 0.2 %   Neutrophils Relative % 69 %   Neutro Abs 8.3 (H) 1.7 - 7.7 K/uL   Lymphocytes Relative 13 %   Lymphs Abs 1.5 0.7 - 4.0 K/uL   Monocytes Relative 14 %   Monocytes Absolute 1.7 (H) 0.1 - 1.0 K/uL   Eosinophils Relative 2 %   Eosinophils Absolute 0.2 0.0 - 0.5 K/uL   Basophils Relative 1 %   Basophils Absolute 0.1 0.0 - 0.1 K/uL   Immature Granulocytes 1 %   Abs Immature Granulocytes 0.08 (H) 0.00 - 0.07 K/uL    Comment: Performed at [facility], [address], [place], [place] [address]  Magnesium     Status: None   Collection Time: 07/07/19  2:15 AM  Result Value Ref Range   Magnesium 1.9 1.7 - 2.4 mg/dL    Comment: Performed at [facility] [address] [place], [place] [address]  Renal function panel     Status: Abnormal   Collection Time: 07/07/19  2:15 AM  Result Value Ref Range   Sodium 140 135 - 145 mmol/L   Potassium 3.5 3.5 - 5.1 mmol/L   Chloride 102 98 - 111 mmol/L   CO2 27 22 - 32 mmol/L   Glucose, Bld  85 70 - 99 mg/dL   BUN 12 8 - 23 mg/dL   Creatinine, Ser 1.10 0.61 - 1.24 mg/dL   Calcium 8.3 (L) 8.9 - 10.3 mg/dL   Phosphorus 3.7  2.5 - 4.6 mg/dL   Albumin 2.2 (L) 3.5 - 5.0 g/dL   GFR calc non Af Amer >60 >60 mL/min   GFR calc Af Amer >60 >60 mL/min   Anion gap 11 5 - 15    Comment: Performed at [facility] [address], [place], [place] [address]  Glucose, capillary     Status: None   Collection Time: 07/07/19  3:11 AM  Result Value Ref Range   Glucose-Capillary 91 70 - 99 mg/dL  Glucose, capillary     Status: Abnormal   Collection Time: 07/07/19  7:54 AM  Result Value Ref Range   Glucose-Capillary 101 (H) 70 - 99 mg/dL  Glucose, capillary     Status: Abnormal   Collection Time: 07/07/19 12:48 PM  Result Value Ref Range   Glucose-Capillary 107 (H) 70 - 99 mg/dL   IR GASTROSTOMY TUBE MOD SED  Result Date: 07/06/2019 INDICATION: 74 year old male with dysphagia following cerebrovascular accident. EXAM: Fluoroscopically guided placement of percutaneous pull-through gastrostomy tube Interventional Radiologist:  Criselda Peaches, MD MEDICATIONS: 2 g Ancef, 1 mg glucagon; Antibiotics were administered within 1 hour of the procedure. ANESTHESIA/SEDATION: Versed 1.5 mg IV; Fentanyl 75 mcg IV Moderate Sedation Time:  8 minutes The patient was continuously monitored during the procedure by the interventional radiology nurse under my direct supervision. CONTRAST:  27mL OMNIPAQUE IOHEXOL 300 MG/ML  SOLN FLUOROSCOPY TIME:  Fluoroscopy Time: 4 minutes 54 seconds (17 mGy). COMPLICATIONS: None immediate. PROCEDURE: Informed written consent was obtained from the patient after a thorough discussion of the procedural risks, benefits and alternatives. All questions were addressed. Maximal Sterile Barrier Technique was utilized including caps, mask, sterile gowns, sterile gloves, sterile drape, hand hygiene and skin antiseptic. A timeout was performed prior to the initiation of the procedure. Maximal barrier sterile technique utilized including caps, mask, sterile gowns, sterile gloves, large sterile drape, hand hygiene, and  chlorhexadine skin prep. An angled catheter was advanced over a wire under fluoroscopic guidance through the nose, down the esophagus and into the body of the stomach. The stomach was then insufflated with several 100 ml of air. Fluoroscopy confirmed location of the gastric bubble, as well as inferior displacement of the barium stained colon. Under direct fluoroscopic guidance, a single T-tack was placed, and the anterior gastric wall drawn up against the anterior abdominal wall. Percutaneous access was then obtained into the mid gastric body with an 18 gauge sheath needle. Aspiration of air, and injection of contrast material under fluoroscopy confirmed needle placement. An Amplatz wire was advanced in the gastric body and the access needle exchanged for a 9-French vascular sheath. A snare device was advanced through the vascular sheath and an Amplatz wire advanced through the angled catheter. The Amplatz wire was successfully snared and this was pulled up through the esophagus and out the mouth. A 20-French Alinda Dooms MIC-PEG tube was then connected to the snare and pulled through the mouth, down the esophagus, into the stomach and out to the anterior abdominal wall. Hand injection of contrast material confirmed intragastric location. The T-tack retention suture was then cut. The pull through peg tube was then secured with the external bumper and capped. The patient will be observed for several hours with the newly placed tube on low wall suction  to evaluate for any post procedure complication. The patient tolerated the procedure well, there is no immediate complication. IMPRESSION: Successful placement of a 20 French pull through gastrostomy tube. Electronically Signed   By: Jacqulynn Cadet M.D.   On: 07/06/2019 18:43       Medical Problem List and Plan: 1.  Dense right hemiplegia with right inattention and poor postural reflexes, decreased safety awareness with delayed processing, difficulty  sequencing, oral apraxia, dysarthria with language of confusion and cognitive deficits secondary to L PCA CVA and chronic R temporal and occipital lobe infarcts with small acute L frontal lobe infarct  -patient may shower  -ELOS/Goals: 27-32 days/mod/max A  Admit to CIR 2.  Antithrombotics: -DVT/anticoagulation:  Pharmaceutical: Heparin  -antiplatelet therapy: DAPT X 3 months followed by ASA alone.   3. Pain Management:  Tylenol prn.  4. Mood: LCSW to follow for evaluation and support when appropriate.   -antipsychotic agents: N/A 5. Neuropsych: This patient is not capable of making decisions on his own behalf. 6. Skin/Wound Care: Routine pressure relief measures. Needs air mattress as unable to position himself.  7. Fluids/Electrolytes/Nutrition: Monitor I/O.  CMP ordered for tomorrow a.m. 8. Aspiration PNA: Leucocytosis resolving. Completed 2 week course of Rocephin 1/22 and Zithromax X 5 days. Oral candida treated with diflucan X & days. 9. Severe post stroke dysphagia: PEG placed on 01/26--Tube feeds being slowly advanced every 4 hours to prevent refeeding syndrome.   Advance diet as tolerated 10 Malnutrition: At risk for refeeding syndrome--need to check  Mg  Phos and K daily X 2  Labs ordered for tomorrow 11. Acute on chronic CHF: Monitor for signs and symptoms of fluid overload.  Blood pressures remain soft--SBP 90's-110.    Daily weights 12. Inferior STEMI: On Lipitor, Coreg BID 13. Urinary retention: Has not voided for since > 18 hours. Needs PVRs as well as caths for volumes > 350cc.   UA/urine culture ordered 14. Hypoxia: Continues to drop to 80's requires deep suctioning and 2 liters per [place].  Discontinue IVF.   Chest x-ray ordered as showing signs of fluid overload 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   Bary Leriche, PA-C 07/07/2019  I have personally  performed a face to face diagnostic evaluation, including, but not limited to relevant history and physical exam findings, of this patient and developed relevant assessment and plan.  Additionally, I have reviewed and concur with the physician assistant's documentation above.  Delice Lesch, MD, ABPMR

## 2019-07-07 NOTE — Progress Notes (Signed)
Pt arrived to unit accompanied by daughter. Pt was drowsy upon arrival. Medication and plan of care were reviewed with pt and daughter. Pt denied any pains. Oriented to unit. No questions or concerns at this time. Amanda Cockayne, LPN

## 2019-07-07 NOTE — Discharge Summary (Signed)
Physician Discharge Summary  Levi Pruitt [id number] DOB: [birth year] DOA: 06/18/2019  PCP: Patient, No Pcp Per  Admit date: 06/18/2019 Discharge date: 07/07/2019  Admitted From: Home Disposition:  CIR   Discharge Condition: Stable CODE STATUS: Full  Diet recommendation: N.p.o., PEG tube for tube feeds  Brief/Interim Summary: Levi Pruitt is a 37 white male with known history of obesity BMI 32 who presented via code stroke 06/18/2019. CT revealed large acute PCA infarct. EKG was concerning for ST elevation MI with reciprocal changes. He was started on heparin and cardiology and neurology consulted. Because of dense hemiparesis as well as prior advanced care directives stating he would not want artificial nutrition, palliative care was consulted. He eventually underwent PEG placement and awaiting rehab placement.   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  Tube feeding recommendations per dietitian  Acute inferior STEMI - Cardiology did not feel was a candidate for anything but medical interventions Heparin IV was discontinued -  Repeat echo 1/15: EF 30-35%. 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 Leonie Man at [facility] in 4 weeks. Too complicated for NP to follow. Thanks.   Ambulatory referral to Vascular Surgery   Complete by: As directed    Right ICA high grade stenosis   Increase activity slowly   Complete by: As directed      Allergies as of 07/07/2019   No Known Allergies     Medication List    TAKE these medications   aspirin 81 MG chewable tablet Place 4 tablets (324 mg total) into feeding tube daily. Start taking on: July 08, 2019   atorvastatin 80 MG tablet Commonly known as: LIPITOR Place 1 tablet (80 mg total) into feeding tube daily at 6 PM.   carvedilol 3.125 MG tablet Commonly known as: COREG Place 1 tablet (3.125 mg total) into feeding tube 2 (two) times daily with a meal.   clopidogrel 75 MG tablet Commonly known as: PLAVIX Place 1 tablet (75 mg total) into feeding tube daily.   [facility] Vitamin D3 250 MCG (10000 UT) Caps Generic drug: Cholecalciferol Take 1 capsule by mouth daily.   vitamin C 250 MG tablet Commonly  known as: ASCORBIC ACID Take 250 mg by mouth daily.      Follow-up Information    Garvin Fila, MD. Schedule an appointment as soon as possible for a visit in 4 week(s).   Specialties: Neurology, Radiology Contact information: [address] [place] [address] [phone]        [facility] [place]. Schedule an appointment as soon as possible for a visit in 4 week(s).   Specialty: Vascular Surgery Contact information: [address] [place]  [place] [phone]         No Known Allergies  Consultations:  Neurology  Cardiology  Critical care  Palliative care  IR   Procedures/Studies: CT ABDOMEN WO CONTRAST  Result Date: 07/01/2019 CLINICAL DATA:  Previous stroke. Preop planning for gastrostomy placement. EXAM: CT ABDOMEN WITHOUT CONTRAST TECHNIQUE: Multidetector CT imaging of the abdomen was performed following the standard protocol without IV contrast. COMPARISON:  None. FINDINGS: Lower chest: Small pleural effusions right greater than left. Patchy airspace opacity centrally in the visualized lung bases. No pericardial effusion. Hepatobiliary: No focal liver abnormality is seen. No gallstones, gallbladder wall thickening, or biliary dilatation. Pancreas: Unremarkable. No pancreatic ductal dilatation or surrounding inflammatory changes. Spleen: Normal in size without focal abnormality. Adrenals/Urinary Tract: Unremarkable adrenal glands. No hydronephrosis. 2.6 cm low-attenuation mid left renal lesion possibly cyst but incompletely characterized. Stomach/Bowel: Feeding tube through the decompressed stomach into the proximal jejunum. There is a small window for percutaneous gastrostomy placement, which may be improved with gastric distension; recommend concurrent colonic opacification. The small bowel is decompressed. retained oral contrast material in the visualized colon and appendix, which are otherwise unremarkable. Vascular/Lymphatic: Aortoiliac atherosclerosis (ICD10-170.0). No abdominal or mesenteric adenopathy. Other: No ascites. No free air. Musculoskeletal: Multilevel lumbar spondylitic change. Bilateral sacroiliitis. No fracture or worrisome bone lesion. IMPRESSION: 1. There is a small window for percutaneous gastrostomy placement, which may be improved with gastric distension; recommend concurrent colonic opacification. 2. Small bilateral pleural effusions right greater than left. Electronically Signed   By: Lucrezia Europe  M.D.   On: 07/01/2019 07:47   CT Code Stroke CTA Head W/WO contrast  Result Date: 06/18/2019 CLINICAL DATA:  74 year old male code stroke presentation with right side weakness. EXAM: CT ANGIOGRAPHY HEAD AND NECK CT PERFUSION BRAIN TECHNIQUE: Multidetector CT imaging of the head and neck was performed using the standard protocol during bolus administration of intravenous contrast. Multiplanar CT image reconstructions and MIPs were obtained to evaluate the vascular anatomy. Carotid stenosis measurements (when applicable) are obtained utilizing NASCET criteria, using the distal internal carotid diameter as the denominator. Multiphase CT imaging of the brain was performed following IV bolus contrast injection. Subsequent parametric perfusion maps were calculated using RAPID software. CONTRAST:  100 milliliters Omnipaque 350 COMPARISON:  Plain head CT 1448 hours today. FINDINGS: CT Brain Perfusion Findings: ASPECTS: 10, although a large left PCA territory infarct was suspected by plain CT. CBF (<30%) Volume: 11mL, corresponding to a portion of the left PCA territory in the occipital lobe Perfusion (Tmax>6.0s) volume: 33mL, although some of this is likely artifactual involving the right temporal lobe portion of which is chronically abnormal. Mismatch Volume: 48mL Infarction Location:Left PCA CTA NECK Skeleton: Carious dentition. No acute osseous abnormality identified. Upper chest: Small bilateral layering pleural effusions. Septal thickening and mild additional peribronchial opacity near the hila in the upper lobes. No superior mediastinal lymphadenopathy. Other neck: Retropharyngeal course of both carotid arteries. Small round 8 millimeter intermediate density soft tissue nodule  in the superficial lobe of the left parotid gland. No cervical lymphadenopathy or other neck mass. Aortic arch: Mild Calcified aortic atherosclerosis. 3 vessel arch configuration. Right carotid system: Mild motion artifact at the right CCA  origin. Circumferential soft plaque in the right CCA proximal to the bifurcation as it has a retropharyngeal course on series 3, image 125, but less than 50 % stenosis with respect to the distal vessel. Superimposed calcified plaque at the right carotid bifurcation, and bulky additional soft plaque in the right ICA bulb resulting in a short segment high-grade stenosis approaching a radiographic string sign on series 3, image 94. The right ICA remains patent to the skull base. Left carotid system: Normal left CCA origin. Circumferential soft plaque in the left CCA similar to that on the right without stenosis. Soft and calcified plaque at the left ICA origin and bulb resulting in up to 60 % stenosis with respect to the distal vessel. Left ICA remains patent to the skull base. Vertebral arteries: Mild if any proximal right subclavian artery plaque with no stenosis there or at the right vertebral artery origin. The right vertebral appears dominant and is patent to the skull base without stenosis. Soft and calcified plaque in the proximal left subclavian artery not resulting in significant subclavian artery stenosis, but there is moderate to severe stenosis at the left vertebral artery origin and V1 segment due to mostly soft plaque (series 6, image 154). The distal left V1 segment is also diminutive and stenotic (image 151). The left V2 segment is intermittently diminutive and stenotic, but the vessel remains patent to the skull base. CTA HEAD Posterior circulation: The right vertebral artery is patent to the vertebrobasilar junction without stenosis. The right PICA origin remains patent. The left V4 segment is occluded distal to the patent left PICA but proximal to the vertebrobasilar junction on series 5, image 132. The basilar artery remains patent to the SCA origins. The left PCA is occluded just beyond its origin (series 9, image 26). No reconstitution. And the right PCA is functionally occluded at its origin with  only minimal irregular right PCA branch enhancement. Anterior circulation: Both ICA siphons are patent. On the left there is moderate to severe circumferential calcified plaque in the cavernous segment resulting in moderate to severe stenosis proximal to the anterior genu (series 5, image 102). Moderate additional left supraclinoid segment stenosis. On the right there is similar advanced siphon atherosclerosis with moderate right cavernous and moderate to severe right supraclinoid segment stenosis. Patent right ICA terminus. There is a patent small left posterior communicating artery. Patent carotid termini. Patent MCA and ACA origins. Left A1 is mildly dominant. Anterior communicating artery is patent with a median artery of the corpus callosum. Bilateral ACA branches are patent with mild irregularity. Left MCA M1 segment and left MCA bifurcation are patent without stenosis. Left MCA M3 branches are mildly to moderately irregular. Right MCA M1 segment and bifurcation are patent without stenosis. Right MCA M3 branches are also mildly to moderately irregular. Venous sinuses: Early contrast timing, grossly patent assuming dominant right side transverse and sigmoid sinuses. Anatomic variants: Median artery of the corpus callosum. Review of the MIP images confirms the above findings IMPRESSION: 1. Positive for acute appearing occlusions of the distal Left Vertebral Artery and Left PCA: - distal left V4 occlusion, with upstream multifocal moderate and severe left vertebral stenosis in the neck. - left PCA P1 occlusion just beyond the origin. 2. CTP detects only some of the left PCA  territory core infarct which was visible by plain CT. 3. 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 The right vertebral artery and basilar arteries are patent without stenosis. 4. Positive also for: - High-grade stenosis of the Right ICA bulb approaching a radiographic string sign, - High-grade stenosis of both ICA siphons due to advanced  calcified plaque, -60% proximal left ICA stenosis. 5. Mild to moderate irregularity of the bilateral MCA branches. 6. Small bilateral pleural effusions with increased pulmonary septal thickening suggestive of pulmonary edema. 7. 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 Recommend follow-up with ENT. Preliminary results of the arterial findings were text paged to Dr. Lorraine Lax via Shea Evans at 1503 hours, and again at 1518 hours. Electronically Signed   By: Genevie Ann M.D.   On: 06/18/2019 15:26   am PNA cxr  Result Date: 06/25/2019 CLINICAL DATA:  Pneumonia EXAM: CHEST  1 VIEW COMPARISON:  06/24/2019 FINDINGS: Enteric tube courses below the diaphragm, beyond the inferior margin of the film. Stable cardiomegaly. Continued worsening of diffuse airspace opacities throughout both lungs, most confluent within the right upper lobe. No pleural effusion or pneumothorax. IMPRESSION: Continued worsening of diffuse airspace opacities throughout both lungs, most confluent within the right upper lobe. Electronically Signed   By: Davina Poke D.O.   On: 06/25/2019 09:48   DG Abd 1 View  Result Date: 06/21/2019 CLINICAL DATA:  74 year old male with dysphagia. EXAM: ABDOMEN - 1 VIEW COMPARISON:  None. FINDINGS: Single view fluoroscopic image of the upper abdomen provided. An enteric tube is noted with tip in the distal duodenum. Contrast injected via the tube opacifies distal duodenum. IMPRESSION: Enteric tube with tip in the distal duodenum. Electronically Signed   By: Anner Crete M.D.   On: 06/21/2019 16:55   CT Code Stroke CTA Neck W/WO contrast  Result Date: 06/18/2019 CLINICAL DATA:  74 year old male code stroke presentation with right side weakness. EXAM: CT ANGIOGRAPHY HEAD AND NECK CT PERFUSION BRAIN TECHNIQUE: Multidetector CT imaging of the head and neck was performed using the standard protocol during bolus administration of intravenous contrast. Multiplanar CT image reconstructions and MIPs  were obtained to evaluate the vascular anatomy. Carotid stenosis measurements (when applicable) are obtained utilizing NASCET criteria, using the distal internal carotid diameter as the denominator. Multiphase CT imaging of the brain was performed following IV bolus contrast injection. Subsequent parametric perfusion maps were calculated using RAPID software. CONTRAST:  100 milliliters Omnipaque 350 COMPARISON:  Plain head CT 1448 hours today. FINDINGS: CT Brain Perfusion Findings: ASPECTS: 10, although a large left PCA territory infarct was suspected by plain CT. CBF (<30%) Volume: 1mL, corresponding to a portion of the left PCA territory in the occipital lobe Perfusion (Tmax>6.0s) volume: 10mL, although some of this is likely artifactual involving the right temporal lobe portion of which is chronically abnormal. Mismatch Volume: 37mL Infarction Location:Left PCA CTA NECK Skeleton: Carious dentition. No acute osseous abnormality identified. Upper chest: Small bilateral layering pleural effusions. Septal thickening and mild additional peribronchial opacity near the hila in the upper lobes. No superior mediastinal lymphadenopathy. Other neck: Retropharyngeal course of both carotid arteries. Small round 8 millimeter intermediate density soft tissue nodule in the superficial lobe of the left parotid gland. No cervical lymphadenopathy or other neck mass. Aortic arch: Mild Calcified aortic atherosclerosis. 3 vessel arch configuration. Right carotid system: Mild motion artifact at the right CCA origin. Circumferential soft plaque in the right CCA proximal to the bifurcation as it has a retropharyngeal course on series 3,  image 125, but less than 50 % stenosis with respect to the distal vessel. Superimposed calcified plaque at the right carotid bifurcation, and bulky additional soft plaque in the right ICA bulb resulting in a short segment high-grade stenosis approaching a radiographic string sign on series 3, image 94.  The right ICA remains patent to the skull base. Left carotid system: Normal left CCA origin. Circumferential soft plaque in the left CCA similar to that on the right without stenosis. Soft and calcified plaque at the left ICA origin and bulb resulting in up to 60 % stenosis with respect to the distal vessel. Left ICA remains patent to the skull base. Vertebral arteries: Mild if any proximal right subclavian artery plaque with no stenosis there or at the right vertebral artery origin. The right vertebral appears dominant and is patent to the skull base without stenosis. Soft and calcified plaque in the proximal left subclavian artery not resulting in significant subclavian artery stenosis, but there is moderate to severe stenosis at the left vertebral artery origin and V1 segment due to mostly soft plaque (series 6, image 154). The distal left V1 segment is also diminutive and stenotic (image 151). The left V2 segment is intermittently diminutive and stenotic, but the vessel remains patent to the skull base. CTA HEAD Posterior circulation: The right vertebral artery is patent to the vertebrobasilar junction without stenosis. The right PICA origin remains patent. The left V4 segment is occluded distal to the patent left PICA but proximal to the vertebrobasilar junction on series 5, image 132. The basilar artery remains patent to the SCA origins. The left PCA is occluded just beyond its origin (series 9, image 26). No reconstitution. And the right PCA is functionally occluded at its origin with only minimal irregular right PCA branch enhancement. Anterior circulation: Both ICA siphons are patent. On the left there is moderate to severe circumferential calcified plaque in the cavernous segment resulting in moderate to severe stenosis proximal to the anterior genu (series 5, image 102). Moderate additional left supraclinoid segment stenosis. On the right there is similar advanced siphon atherosclerosis with moderate  right cavernous and moderate to severe right supraclinoid segment stenosis. Patent right ICA terminus. There is a patent small left posterior communicating artery. Patent carotid termini. Patent MCA and ACA origins. Left A1 is mildly dominant. Anterior communicating artery is patent with a median artery of the corpus callosum. Bilateral ACA branches are patent with mild irregularity. Left MCA M1 segment and left MCA bifurcation are patent without stenosis. Left MCA M3 branches are mildly to moderately irregular. Right MCA M1 segment and bifurcation are patent without stenosis. Right MCA M3 branches are also mildly to moderately irregular. Venous sinuses: Early contrast timing, grossly patent assuming dominant right side transverse and sigmoid sinuses. Anatomic variants: Median artery of the corpus callosum. Review of the MIP images confirms the above findings IMPRESSION: 1. Positive for acute appearing occlusions of the distal Left Vertebral Artery and Left PCA: - distal left V4 occlusion, with upstream multifocal moderate and severe left vertebral stenosis in the neck. - left PCA P1 occlusion just beyond the origin. 2. CTP detects only some of the left PCA territory core infarct which was visible by plain CT. 3. 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 The right vertebral artery and basilar arteries are patent without stenosis. 4. Positive also for: - High-grade stenosis of the Right ICA bulb approaching a radiographic string sign, - High-grade stenosis of both ICA siphons due to advanced calcified plaque, -  60% proximal left ICA stenosis. 5. Mild to moderate irregularity of the bilateral MCA branches. 6. Small bilateral pleural effusions with increased pulmonary septal thickening suggestive of pulmonary edema. 7. 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 Recommend follow-up with ENT. Preliminary results of the arterial findings were text paged to Dr. Lorraine Lax via Shea Evans at 1503 hours, and again at  1518 hours. Electronically Signed   By: Genevie Ann M.D.   On: 06/18/2019 15:26   MR BRAIN WO CONTRAST  Result Date: 06/19/2019 CLINICAL DATA:  Stroke follow-up. EXAM: MRI HEAD WITHOUT CONTRAST TECHNIQUE: Multiplanar, multiecho pulse sequences of the brain and surrounding structures were obtained without intravenous contrast. COMPARISON:  Head CT, CTA, and CTP 06/18/2019 FINDINGS: Brain: As seen on CT, there is a large acute left PCA infarct involving the medial aspects of the left temporal and occipital lobes. The left thalamus, posterior limb of the left internal capsule, left splenium of the corpus callosum, left mamillary body, and left cerebral peduncle are also involved. There is also a 7 mm acute cortical infarct in the posterior left frontal lobe which involves the anterior aspect of the precentral gyrus. There is no hemorrhage associated with these acute infarcts. Chronic infarcts are again noted in the right temporal and right occipital lobes. There is a single punctate focus of susceptibility artifact in the right cerebellum suggesting a chronic microhemorrhage. Small chronic infarcts are noted in the cerebellum bilaterally. Scattered small foci of T2 hyperintensity in the cerebral white matter bilaterally are nonspecific but compatible with minimal chronic small vessel ischemic disease. There is no mass, midline shift, or extra-axial fluid collection. There is mild global cerebral atrophy. Vascular: Abnormal appearance of the distal left vertebral artery corresponding to occlusion on CT. Skull and upper cervical spine: No suspicious marrow lesion. Sinuses/Orbits: Unremarkable orbits. Paranasal sinuses and mastoid air cells are clear. Other: None. IMPRESSION: 1. Large acute left PCA infarct. 2. Small acute left frontal lobe infarct. 3. 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 Electronically Signed   By: Logan Bores M.D.   On: 06/19/2019 11:27   IR GASTROSTOMY TUBE MOD SED  Result  Date: 07/06/2019 INDICATION: 74 year old male with dysphagia following cerebrovascular accident. EXAM: Fluoroscopically guided placement of percutaneous pull-through gastrostomy tube Interventional Radiologist:  Criselda Peaches, MD MEDICATIONS: 2 g Ancef, 1 mg glucagon; Antibiotics were administered within 1 hour of the procedure. ANESTHESIA/SEDATION: Versed 1.5 mg IV; Fentanyl 75 mcg IV Moderate Sedation Time:  8 minutes The patient was continuously monitored during the procedure by the interventional radiology nurse under my direct supervision. CONTRAST:  34mL OMNIPAQUE IOHEXOL 300 MG/ML  SOLN FLUOROSCOPY TIME:  Fluoroscopy Time: 4 minutes 54 seconds (17 mGy). COMPLICATIONS: None immediate. PROCEDURE: Informed written consent was obtained from the patient after a thorough discussion of the procedural risks, benefits and alternatives. All questions were addressed. Maximal Sterile Barrier Technique was utilized including caps, mask, sterile gowns, sterile gloves, sterile drape, hand hygiene and skin antiseptic. A timeout was performed prior to the initiation of the procedure. Maximal barrier sterile technique utilized including caps, mask, sterile gowns, sterile gloves, large sterile drape, hand hygiene, and chlorhexadine skin prep. An angled catheter was advanced over a wire under fluoroscopic guidance through the nose, down the esophagus and into the body of the stomach. The stomach was then insufflated with several 100 ml of air. Fluoroscopy confirmed location of the gastric bubble, as well as inferior displacement of the barium stained colon. Under direct fluoroscopic guidance, a single  T-tack was placed, and the anterior gastric wall drawn up against the anterior abdominal wall. Percutaneous access was then obtained into the mid gastric body with an 18 gauge sheath needle. Aspiration of air, and injection of contrast material under fluoroscopy confirmed needle placement. An Amplatz wire was advanced in the  gastric body and the access needle exchanged for a 9-French vascular sheath. A snare device was advanced through the vascular sheath and an Amplatz wire advanced through the angled catheter. The Amplatz wire was successfully snared and this was pulled up through the esophagus and out the mouth. A 20-French Alinda Dooms MIC-PEG tube was then connected to the snare and pulled through the mouth, down the esophagus, into the stomach and out to the anterior abdominal wall. Hand injection of contrast material confirmed intragastric location. The T-tack retention suture was then cut. The pull through peg tube was then secured with the external bumper and capped. The patient will be observed for several hours with the newly placed tube on low wall suction to evaluate for any post procedure complication. The patient tolerated the procedure well, there is no immediate complication. IMPRESSION: Successful placement of a 20 French pull through gastrostomy tube. Electronically Signed   By: Jacqulynn Cadet M.D.   On: 07/06/2019 18:43   CT Code Stroke Cerebral Perfusion with contrast  Result Date: 06/18/2019 CLINICAL DATA:  74 year old male code stroke presentation with right side weakness. EXAM: CT ANGIOGRAPHY HEAD AND NECK CT PERFUSION BRAIN TECHNIQUE: Multidetector CT imaging of the head and neck was performed using the standard protocol during bolus administration of intravenous contrast. Multiplanar CT image reconstructions and MIPs were obtained to evaluate the vascular anatomy. Carotid stenosis measurements (when applicable) are obtained utilizing NASCET criteria, using the distal internal carotid diameter as the denominator. Multiphase CT imaging of the brain was performed following IV bolus contrast injection. Subsequent parametric perfusion maps were calculated using RAPID software. CONTRAST:  100 milliliters Omnipaque 350 COMPARISON:  Plain head CT 1448 hours today. FINDINGS: CT Brain Perfusion Findings: ASPECTS:  10, although a large left PCA territory infarct was suspected by plain CT. CBF (<30%) Volume: 33mL, corresponding to a portion of the left PCA territory in the occipital lobe Perfusion (Tmax>6.0s) volume: 69mL, although some of this is likely artifactual involving the right temporal lobe portion of which is chronically abnormal. Mismatch Volume: 52mL Infarction Location:Left PCA CTA NECK Skeleton: Carious dentition. No acute osseous abnormality identified. Upper chest: Small bilateral layering pleural effusions. Septal thickening and mild additional peribronchial opacity near the hila in the upper lobes. No superior mediastinal lymphadenopathy. Other neck: Retropharyngeal course of both carotid arteries. Small round 8 millimeter intermediate density soft tissue nodule in the superficial lobe of the left parotid gland. No cervical lymphadenopathy or other neck mass. Aortic arch: Mild Calcified aortic atherosclerosis. 3 vessel arch configuration. Right carotid system: Mild motion artifact at the right CCA origin. Circumferential soft plaque in the right CCA proximal to the bifurcation as it has a retropharyngeal course on series 3, image 125, but less than 50 % stenosis with respect to the distal vessel. Superimposed calcified plaque at the right carotid bifurcation, and bulky additional soft plaque in the right ICA bulb resulting in a short segment high-grade stenosis approaching a radiographic string sign on series 3, image 94. The right ICA remains patent to the skull base. Left carotid system: Normal left CCA origin. Circumferential soft plaque in the left CCA similar to that on the right without stenosis. Soft and calcified plaque  at the left ICA origin and bulb resulting in up to 60 % stenosis with respect to the distal vessel. Left ICA remains patent to the skull base. Vertebral arteries: Mild if any proximal right subclavian artery plaque with no stenosis there or at the right vertebral artery origin. The right  vertebral appears dominant and is patent to the skull base without stenosis. Soft and calcified plaque in the proximal left subclavian artery not resulting in significant subclavian artery stenosis, but there is moderate to severe stenosis at the left vertebral artery origin and V1 segment due to mostly soft plaque (series 6, image 154). The distal left V1 segment is also diminutive and stenotic (image 151). The left V2 segment is intermittently diminutive and stenotic, but the vessel remains patent to the skull base. CTA HEAD Posterior circulation: The right vertebral artery is patent to the vertebrobasilar junction without stenosis. The right PICA origin remains patent. The left V4 segment is occluded distal to the patent left PICA but proximal to the vertebrobasilar junction on series 5, image 132. The basilar artery remains patent to the SCA origins. The left PCA is occluded just beyond its origin (series 9, image 26). No reconstitution. And the right PCA is functionally occluded at its origin with only minimal irregular right PCA branch enhancement. Anterior circulation: Both ICA siphons are patent. On the left there is moderate to severe circumferential calcified plaque in the cavernous segment resulting in moderate to severe stenosis proximal to the anterior genu (series 5, image 102). Moderate additional left supraclinoid segment stenosis. On the right there is similar advanced siphon atherosclerosis with moderate right cavernous and moderate to severe right supraclinoid segment stenosis. Patent right ICA terminus. There is a patent small left posterior communicating artery. Patent carotid termini. Patent MCA and ACA origins. Left A1 is mildly dominant. Anterior communicating artery is patent with a median artery of the corpus callosum. Bilateral ACA branches are patent with mild irregularity. Left MCA M1 segment and left MCA bifurcation are patent without stenosis. Left MCA M3 branches are mildly to  moderately irregular. Right MCA M1 segment and bifurcation are patent without stenosis. Right MCA M3 branches are also mildly to moderately irregular. Venous sinuses: Early contrast timing, grossly patent assuming dominant right side transverse and sigmoid sinuses. Anatomic variants: Median artery of the corpus callosum. Review of the MIP images confirms the above findings IMPRESSION: 1. Positive for acute appearing occlusions of the distal Left Vertebral Artery and Left PCA: - distal left V4 occlusion, with upstream multifocal moderate and severe left vertebral stenosis in the neck. - left PCA P1 occlusion just beyond the origin. 2. CTP detects only some of the left PCA territory core infarct which was visible by plain CT. 3. 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 The right vertebral artery and basilar arteries are patent without stenosis. 4. 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 5. Mild to moderate irregularity of the bilateral MCA branches. 6. Small bilateral pleural effusions with increased pulmonary septal thickening suggestive of pulmonary edema. 7. 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 Recommend follow-up with ENT. Preliminary results of the arterial findings were text paged to Dr. Lorraine Lax via Shea Evans at 1503 hours, and again at 1518 hours. Electronically Signed   By: Genevie Ann M.D.   On: 06/18/2019 15:26   DG CHEST PORT  1 VIEW  Result Date: 07/02/2019 CLINICAL DATA:  74 year old male with shortness of breath. EXAM: PORTABLE CHEST 1 VIEW COMPARISON:  Chest radiograph dated 06/25/2019. FINDINGS: Feeding tube extends below the diaphragm. Bilateral confluent airspace opacities may represent edema or multifocal pneumonia or combination. Probable small bilateral pleural effusions. No pneumothorax. Stable cardiac  silhouette. No acute osseous pathology. IMPRESSION: Bilateral confluent densities may represent pneumonia or edema. Clinical correlation and follow-up recommended. Electronically Signed   By: Anner Crete M.D.   On: 07/02/2019 20:48   DG CHEST PORT 1 VIEW  Result Date: 06/24/2019 CLINICAL DATA:  Follow-up so the HF. EXAM: PORTABLE CHEST 1 VIEW COMPARISON:  06/23/2019 FINDINGS: Opacity has become more apparent in the upper lobes, right greater than left. There is more diffuse prominence of the bronchovascular markings, similar to the previous day's exam. No convincing pleural effusion.  No pneumothorax. Orogastric tube passes into the stomach below the included field of view. IMPRESSION: 1. Mild increase in upper lung zone airspace opacities, right greater than left, with persistent prominent bronchovascular markings. Findings may reflect mild worsening pulmonary edema with asymmetric upper lobe airspace edema, versus multifocal infection. Electronically Signed   By: Lajean Manes M.D.   On: 06/24/2019 11:14   DG CHEST PORT 1 VIEW  Result Date: 06/23/2019 CLINICAL DATA:  Congestive heart failure. EXAM: PORTABLE CHEST 1 VIEW COMPARISON:  June 22, 2019. FINDINGS: Stable cardiomegaly. Feeding tube is seen entering stomach. No pneumothorax or pleural effusion is noted. Minimal central pulmonary vascular congestion may be present. No significant consolidative process is noted. Bony thorax is unremarkable. IMPRESSION: 1. Stable cardiomegaly. Minimal central pulmonary vascular congestion may be present. No significant consolidative process is noted. 2. No evidence of pulmonary edema. Electronically Signed   By: Marijo Conception M.D.   On: 06/23/2019 07:46   DG CHEST PORT 1 VIEW  Result Date: 06/22/2019 CLINICAL DATA:  Shortness of breath. EXAM: PORTABLE CHEST 1 VIEW COMPARISON:  June 20, 2019 FINDINGS: Feeding tube tip is below the diaphragm. No pneumothorax. There is cardiomegaly with pulmonary  vascular congestion. There is hazy alveolar opacity bilaterally in a predominantly perihilar distribution. There is also mild interstitial thickening. There is an equivocal left pleural effusion. No adenopathy. No bone lesions. IMPRESSION: Cardiomegaly with pulmonary vascular congestion. Perihilar alveolar opacity, likely alveolar edema. There may be mild interstitial edema superimposed. The overall appearance is most indicative of congestive heart failure. A degree of atypical organism pneumonia superimposed cannot be excluded given the areas of perihilar airspace opacity. Feeding tube tip is below the diaphragm. Electronically Signed   By: Lowella Grip III M.D.   On: 06/22/2019 07:46   DG CHEST PORT 1 VIEW  Result Date: 06/20/2019 CLINICAL DATA:  Patient admitted with a stroke. EXAM: PORTABLE CHEST 1 VIEW COMPARISON:  June 19, 2019 FINDINGS: Persistent cardiomegaly and vascular congestion. The vascular congestion has worsened. No other interval changes. IMPRESSION: Cardiomegaly and worsening vascular congestion/mild edema. Electronically Signed   By: Dorise Bullion III M.D   On: 06/20/2019 18:10   DG CHEST PORT 1 VIEW  Result Date: 06/19/2019 CLINICAL DATA:  Acute left PCA infarct EXAM: PORTABLE CHEST 1 VIEW COMPARISON:  06/18/2019 FINDINGS: Stable cardiomegaly with central vascular congestion. Left basilar airspace opacity/consolidation obscures the left hemidiaphragm compatible with atelectasis and or pneumonia. Right lung remains clear. No large effusion or pneumothorax. Trachea midline. Degenerative changes of the spine. IMPRESSION: Cardiomegaly with vascular congestion Left basilar atelectasis/pneumonia. Electronically Signed   By: Jerilynn Mages.  Shick M.D.  On: 06/19/2019 13:48   XR Chest Single View  Result Date: 06/18/2019 CLINICAL DATA:  Altered behavior EXAM: PORTABLE CHEST 1 VIEW COMPARISON:  None. FINDINGS: Mild cardiomegaly with central vascular congestion. Probable small pleural effusions.  Patchy airspace disease left infrahilar lung. No pneumothorax. IMPRESSION: 1. Mild cardiomegaly with central vascular congestion and small pleural effusions. 2. Patchy airspace disease in the left infrahilar lung, atelectasis versus pneumonia. Electronically Signed   By: Donavan Foil M.D.   On: 06/18/2019 17:40   DG Abd Portable 1V  Result Date: 06/29/2019 CLINICAL DATA:  Stroke.  Peg tube placement needed. EXAM: PORTABLE ABDOMEN - 1 VIEW COMPARISON:  06/21/2019. FINDINGS: Feeding tube noted with its tip over the proximal small bowel. No gastric or bowel distention. Contrast in the colon no free air. No acute bony abnormality. IMPRESSION: Feeding tube noted with its tip over the proximal small bowel. No gastric or bowel distention. Electronically Signed   By: Marcello Moores  Register   On: 06/29/2019 16:19   DG Swallowing Func-Speech Pathology  Result Date: 06/28/2019 Objective Swallowing Evaluation: Type of Study: MBS-Modified Barium Swallow Study  Patient Details Name: Levi Pruitt MRN: [id number] Date of Birth: April 24, 1946 Today's Date: 06/28/2019 Time: SLP Start Time (ACUTE ONLY): 1240 -SLP Stop Time (ACUTE ONLY): 1254 SLP Time Calculation (min) (ACUTE ONLY): 14 min Past Medical History: Past Medical History: Diagnosis Date . 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 HPI:  74 year old Caucasian male, obese, with no other documented past medical history. Patient is not able to provide and history.  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  Subjective: Pt was lethargic Assessment / Plan / Recommendation CHL IP CLINICAL IMPRESSIONS 06/28/2019 Clinical Impression Pt was seen for a Modified Barium Swallow Study and he presents with moderate  oropharyngeal dysphagia with resultant silent aspiration of thin liquid and deep laryngeal penetration with nectar-thick liquid on today's examination.  Laryngeal penetration and aspiration were secondary to premature spillage to the laryngeal vestibule/pyriform sinuses with a delayed swallow initiation and delayed laryngeal closure.  No laryngeal penetration or aspiration was observed with honey-thick liquid or pureed solids.  Pt was observed to be lethargic during this evaluation and he required verbal encouragement and extra time for AP transport and swallow initiation.  He was unable to trigger a swallow with tsps of thin liquid or honey-thick liquid and he instead pocketed the boluses in his anterior sulci. Oral phase was remarkable for reduced lingual control resulting in premature spillage to the larynx/pharynx, reduced lingual coordination resulting in lingual pumping, and reduced lingual strength resulting in trace-moderate oral residue.  Intermittent piecemeal deglutition was also observed.  Pharyngeal phase was remarkable for reduced BOT retraction and reduced hyolaryngeal excursion resulting in vallecular residue.   Of note, cortrak was in place for this evaluation but it did not interfere with epiglottic inversion.  Recommend initiation of Dysphagia 1 (puree) solids and honey-thick liquids with the following compensatory strategies: 1) Awake/alert 2) Small bites/sips 3) Sit upright as possible 4) Intermittent dry swallows 5) Check for oral clearance.  Despite initiation of a PO diet, there are still concerns that the patient may not meet his nutritional needs via PO intake alone secondary to lethargy.  Spoke with MD regarding results and recommendations who additionally relayed them to  palliative care NP.   SLP Visit Diagnosis Dysphagia, oropharyngeal phase (R13.12) Attention and concentration deficit following -- Frontal lobe and executive function deficit following -- Impact on safety and function  Moderate aspiration risk   CHL IP TREATMENT RECOMMENDATION 06/28/2019 Treatment Recommendations Therapy as outlined in treatment plan below   Prognosis 06/28/2019 Prognosis for Safe Diet Advancement Fair Barriers to Reach Goals Severity of deficits;Time post onset Barriers/Prognosis Comment -- CHL IP DIET RECOMMENDATION 06/28/2019 SLP Diet Recommendations Dysphagia 1 (Puree) solids;Honey thick liquids;Alternative means - temporary Liquid Administration via Cup;Straw Medication Administration Crushed with puree Compensations Slow rate;Small sips/bites;Effortful swallow Postural Changes Seated upright at 90 degrees   CHL IP OTHER RECOMMENDATIONS 06/28/2019 Recommended Consults -- Oral Care Recommendations Oral care BID;Staff/trained caregiver to provide oral care Other Recommendations Order thickener from pharmacy;Remove water pitcher   CHL IP FOLLOW UP RECOMMENDATIONS 06/28/2019 Follow up Recommendations Inpatient Rehab   CHL IP FREQUENCY AND DURATION 06/28/2019 Speech Therapy Frequency (ACUTE ONLY) min 2x/week Treatment Duration 2 weeks      CHL IP ORAL PHASE 06/28/2019 Oral Phase Impaired Oral - Pudding Teaspoon -- Oral - Pudding Cup -- Oral - Honey Teaspoon Holding of bolus;Lingual pumping;Pocketing in anterior sulcus Oral - Honey Cup Weak lingual manipulation;Reduced posterior propulsion;Lingual/palatal residue;Premature spillage Oral - Nectar Teaspoon Weak lingual manipulation;Delayed oral transit;Premature spillage;Lingual/palatal residue Oral - Nectar Cup -- Oral - Nectar Straw Delayed oral transit;Weak lingual manipulation;Lingual/palatal residue Oral - Thin Teaspoon Holding of bolus;Pocketing in anterior sulcus Oral - Thin Cup -- Oral - Thin Straw Piecemeal swallowing;Decreased bolus cohesion;Delayed oral transit;Lingual/palatal residue;Weak lingual manipulation Oral - Puree Weak lingual manipulation;Lingual/palatal residue;Delayed oral transit;Premature spillage Oral - Mech Soft -- Oral - Regular -- Oral -  Multi-Consistency -- Oral - Pill -- Oral Phase - Comment --  CHL IP PHARYNGEAL PHASE 06/28/2019 Pharyngeal Phase Impaired Pharyngeal- Pudding Teaspoon -- Pharyngeal -- Pharyngeal- Pudding Cup -- Pharyngeal -- Pharyngeal- Honey Teaspoon -- Pharyngeal -- Pharyngeal- Honey Cup Delayed swallow initiation-vallecula;Reduced tongue base retraction;Reduced anterior laryngeal mobility;Pharyngeal residue - valleculae Pharyngeal Material does not enter airway Pharyngeal- Nectar Teaspoon Delayed swallow initiation-pyriform sinuses;Penetration/Aspiration before swallow;Penetration/Aspiration during swallow;Trace aspiration;Reduced airway/laryngeal closure;Reduced tongue base retraction;Reduced anterior laryngeal mobility Pharyngeal Material enters airway, passes BELOW cords without attempt by patient to eject out (silent aspiration) Pharyngeal- Nectar Cup -- Pharyngeal -- Pharyngeal- Nectar Straw Delayed swallow initiation-pyriform sinuses;Reduced anterior laryngeal mobility;Reduced tongue base retraction Pharyngeal Material does not enter airway Pharyngeal- Thin Teaspoon -- Pharyngeal -- Pharyngeal- Thin Cup -- Pharyngeal -- Pharyngeal- Thin Straw Delayed swallow initiation-pyriform sinuses;Penetration/Aspiration before swallow;Moderate aspiration;Reduced anterior laryngeal mobility;Reduced airway/laryngeal closure;Reduced tongue base retraction;Pharyngeal residue - valleculae Pharyngeal Material enters airway, passes BELOW cords without attempt by patient to eject out (silent aspiration) Pharyngeal- Puree Delayed swallow initiation-vallecula;Pharyngeal residue - valleculae;Reduced anterior laryngeal mobility;Reduced tongue base retraction Pharyngeal Material does not enter airway Pharyngeal- Mechanical Soft -- Pharyngeal -- Pharyngeal- Regular -- Pharyngeal -- Pharyngeal- Multi-consistency -- Pharyngeal -- Pharyngeal- Pill -- Pharyngeal -- Pharyngeal Comment --  CHL IP CERVICAL ESOPHAGEAL PHASE 06/21/2019 Cervical Esophageal  Phase WFL Pudding Teaspoon -- Pudding Cup -- Honey Teaspoon -- Honey Cup -- Nectar Teaspoon -- Nectar Cup -- Nectar Straw -- Thin Teaspoon -- Thin Cup -- Thin Straw -- Puree -- Mechanical Soft -- Regular -- Multi-consistency -- Pill -- Cervical Esophageal Comment -- Colin Mulders M.S., CCC-SLP Acute Rehabilitation Services Office: [phone] [place] 06/28/2019, 2:24 PM              DG Swallowing Func-Speech Pathology  Result Date: 06/21/2019  Objective Swallowing Evaluation: Type of Study: MBS-Modified Barium Swallow Study  Patient Details Name: Levi Pruitt MRN: [id number] Date of Birth: [birth year] Today's Date: 06/21/2019 Time: SLP Start Time (ACUTE ONLY): 1100 -SLP Stop Time (ACUTE ONLY): 1128 SLP Time Calculation (min) (ACUTE ONLY): 28 min Past Medical History: Past Medical History: Diagnosis Date . 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 HPI:  74 year old Caucasian male, obese, with no other documented past medical history. Patient is not able to provide and history.  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  Subjective: lethargic, arousable with multimodal cues Assessment / Plan / Recommendation CHL IP CLINICAL IMPRESSIONS 06/21/2019 Clinical Impression MBSS reveals moderate oral dysphagia, mild to moderate pharyngeal dyshpagia suspected to be of multifactorial etiology; neurogenic and cognitive decline post CVA. Pt required maximal cueing for improving alertness throughout MBSS.  Oral deficits include right sided oral motor deficits including reduced lingual coordination, oral holding, intermittent right sided anterior spillage, right sided buccal pocketing, and delayed oral transit. Thin liquid by teaspoon bolus, extracted from  oral cavity after prolonged oral holding despite cues. Oral suction utilized post MBSS to assist with removal of oral residuals.  Pharyngeal deficits c/b delay in swallow initiation, decreased timely epiglottic inversion, reduced base of tongue retraction and decreased laryngeal elevation. This allowed for intermittent pre swallow penetration of nectar thick liquids, intermittent during the swallow penetration of honey thick liquids by cup. Despite no aspiration visualized on MBSS this date, risk remains significantly increased due to fluctuation in patients mentation and deficits noted on exam. Recommend continue NPO with short term alternative nutrition with PO trials with SLP (nectar thick, honey thick, and puree POs) and hopes of diet advancement. SLP to follow up.   SLP Visit Diagnosis Dysphagia, oropharyngeal phase (R13.12) Attention and concentration deficit following -- Frontal lobe and executive function deficit following -- Impact on safety and function Moderate aspiration risk;Severe aspiration risk;Risk for inadequate nutrition/hydration   CHL IP TREATMENT RECOMMENDATION 06/21/2019 Treatment Recommendations Therapy as outlined in treatment plan below   Prognosis 06/21/2019 Prognosis for Safe Diet Advancement Fair Barriers to Reach Goals Time post onset Barriers/Prognosis Comment -- CHL IP DIET RECOMMENDATION 06/21/2019 SLP Diet Recommendations Alternative means - temporary Liquid Administration via -- Medication Administration Via alternative means Compensations -- Postural Changes --   CHL IP OTHER RECOMMENDATIONS 06/21/2019 Recommended Consults -- Oral Care Recommendations Oral care QID Other Recommendations --   CHL IP FOLLOW UP RECOMMENDATIONS 06/21/2019 Follow up Recommendations Inpatient Rehab;Skilled Nursing facility   [facility] IP FREQUENCY AND DURATION 06/21/2019 Speech Therapy Frequency (ACUTE ONLY) min 2x/week Treatment Duration 2 weeks      CHL IP ORAL PHASE 06/21/2019 Oral Phase Impaired Oral - Pudding  Teaspoon -- Oral - Pudding Cup -- Oral - Honey Teaspoon -- Oral - Honey Cup Weak lingual manipulation;Right anterior bolus loss;Reduced posterior propulsion;Incomplete tongue to palate contact;Holding of bolus;Lingual/palatal residue;Right pocketing in lateral sulci;Delayed oral transit Oral - Nectar Teaspoon -- Oral - Nectar Cup Right anterior bolus loss;Weak lingual manipulation;Incomplete tongue to palate contact;Reduced posterior propulsion;Holding of bolus;Right pocketing in lateral sulci;Pocketing in anterior sulcus;Lingual/palatal residue;Delayed oral transit Oral - Nectar Straw -- Oral - Thin Teaspoon Holding of bolus;Right anterior bolus loss Oral - Thin Cup -- Oral - Thin Straw -- Oral - Puree Delayed  oral transit;Lingual/palatal residue;Weak lingual manipulation;Incomplete tongue to palate contact;Reduced posterior propulsion;Decreased bolus cohesion Oral - Mech Soft -- Oral - Regular -- Oral - Multi-Consistency -- Oral - Pill -- Oral Phase - Comment --  CHL IP PHARYNGEAL PHASE 06/21/2019 Pharyngeal Phase Impaired Pharyngeal- Pudding Teaspoon -- Pharyngeal -- Pharyngeal- Pudding Cup -- Pharyngeal -- Pharyngeal- Honey Teaspoon -- Pharyngeal -- Pharyngeal- Honey Cup Delayed swallow initiation-pyriform sinuses;Reduced anterior laryngeal mobility;Reduced airway/laryngeal closure;Reduced tongue base retraction;Penetration/Aspiration during swallow;Pharyngeal residue - valleculae;Pharyngeal residue - pyriform;Reduced epiglottic inversion Pharyngeal Material does not enter airway;Material enters airway, remains ABOVE vocal cords then ejected out Pharyngeal- Nectar Teaspoon -- Pharyngeal -- Pharyngeal- Nectar Cup Delayed swallow initiation-pyriform sinuses;Reduced epiglottic inversion;Reduced airway/laryngeal closure;Reduced tongue base retraction;Penetration/Aspiration before swallow Pharyngeal Material does not enter airway;Material enters airway, remains ABOVE vocal cords and not ejected out Pharyngeal- Nectar  Straw -- Pharyngeal -- Pharyngeal- Thin Teaspoon Other (Comment) Pharyngeal -- Pharyngeal- Thin Cup -- Pharyngeal -- Pharyngeal- Thin Straw -- Pharyngeal -- Pharyngeal- Puree Delayed swallow initiation-vallecula;Reduced tongue base retraction;Pharyngeal residue - valleculae;Reduced anterior laryngeal mobility;Reduced laryngeal elevation;Pharyngeal residue - pyriform Pharyngeal -- Pharyngeal- Mechanical Soft -- Pharyngeal -- Pharyngeal- Regular -- Pharyngeal -- Pharyngeal- Multi-consistency -- Pharyngeal -- Pharyngeal- Pill -- Pharyngeal -- Pharyngeal Comment --  CHL IP CERVICAL ESOPHAGEAL PHASE 06/21/2019 Cervical Esophageal Phase WFL Pudding Teaspoon -- Pudding Cup -- Honey Teaspoon -- Honey Cup -- Nectar Teaspoon -- Nectar Cup -- Nectar Straw -- Thin Teaspoon -- Thin Cup -- Thin Straw -- Puree -- Mechanical Soft -- Regular -- Multi-consistency -- Pill -- Cervical Esophageal Comment -- Chelsea E Hartness MA, CCC-SLP [facility] 06/21/2019, 12:00 PM              ECHOCARDIOGRAM COMPLETE  Result Date: 06/25/2019   ECHOCARDIOGRAM REPORT   Patient Name:   Levi Pruitt Date of Exam: 06/25/2019 Medical Rec #:  [id number]   Height:       69.0 in Accession #:    [id number]  Weight:       208.3 lb Date of Birth:  08-Mar-1946   BSA:          2.10 m Patient Age:    38 years    BP:           124/69 mmHg Patient Gender: M           HR:           69 bpm. Exam Location:  Inpatient Procedure: 2D Echo Indications:    stroke  History:        Patient has prior history of Echocardiogram examinations, most                 recent 06/19/2019. Stroke.  Sonographer:    Jannett Celestine RDCS (AE) Referring Phys: [id number] Rosalin Hawking  Sonographer Comments: Technically difficult study due to poor echo windows. Image acquisition challenging due to patient body habitus. extremely limited mobility (please see comments) IMPRESSIONS  1. Left ventricular ejection fraction, by visual estimation, is 30 to 35%. The left ventricle has moderate  to severely decreased function. There is mildly increased left ventricular hypertrophy.  2. The left ventricle demonstrates regional wall motion abnormalities. Inferior/inferoseptal/apical akinesis  3. Moderately dilated left ventricular internal cavity size.  4. Left ventricular diastolic parameters are consistent with Grade III diastolic dysfunction (restrictive).  5. Elevated left atrial pressure.  6. Global right ventricle has normal systolic function.The right ventricular size is normal.  7. The mitral valve is normal in structure. Trivial mitral valve regurgitation.  8. The tricuspid valve is normal in structure.  9. The aortic valve is tricuspid. Aortic valve regurgitation is not visualized. No evidence of aortic valve sclerosis or stenosis. 10. The pulmonic valve was not well visualized. Pulmonic valve regurgitation is not visualized. 11. TR signal is inadequate for assessing pulmonary artery systolic pressure. 12. The inferior vena cava is normal in size with greater than 50% respiratory variability, suggesting right atrial pressure of 3 mmHg. FINDINGS  Left Ventricle: Left ventricular ejection fraction, by visual estimation, is 30 to 35%. The left ventricle has moderate to severely decreased function. Definity contrast agent was given IV to delineate the left ventricular endocardial borders. The left ventricle demonstrates regional wall motion abnormalities. The left ventricular internal cavity size was moderately dilated left ventricle. There is mildly increased left ventricular hypertrophy. Left ventricular diastolic parameters are consistent with Grade III diastolic dysfunction (restrictive). Elevated left atrial pressure. Right Ventricle: The right ventricular size is normal. No increase in right ventricular wall thickness. Global RV systolic function is has normal systolic function. Left Atrium: Left atrial size was not well visualized. Right Atrium: Right atrial size was not well visualized  Pericardium: There is no evidence of pericardial effusion. Mitral Valve: The mitral valve is normal in structure. Trivial mitral valve regurgitation. Tricuspid Valve: The tricuspid valve is normal in structure. Tricuspid valve regurgitation is not demonstrated. Aortic Valve: The aortic valve is tricuspid. Aortic valve regurgitation is not visualized. The aortic valve is structurally normal, with no evidence of sclerosis or stenosis. Pulmonic Valve: The pulmonic valve was not well visualized. Pulmonic valve regurgitation is not visualized. Pulmonic regurgitation is not visualized. Aorta: The aortic root is normal in size and structure. Venous: The inferior vena cava is normal in size with greater than 50% respiratory variability, suggesting right atrial pressure of 3 mmHg. IAS/Shunts: The interatrial septum was not well visualized.  LEFT VENTRICLE PLAX 2D LVIDd:         6.30 cm  Diastology LVIDs:         4.80 cm  LV e' lateral:   9.57 cm/s LV PW:         1.20 cm  LV E/e' lateral: 10.8 LV IVS:        1.10 cm  LV e' medial:    3.81 cm/s LVOT diam:     2.10 cm  LV E/e' medial:  27.0 LV SV:         94 ml LV SV Index:   43.13 LVOT Area:     3.46 cm  LEFT ATRIUM         Index LA diam:    4.20 cm 2.00 cm/m  AORTIC VALVE LVOT Vmax:   65.80 cm/s LVOT Vmean:  43.300 cm/s LVOT VTI:    0.136 m  AORTA Ao Root diam: 3.30 cm MITRAL VALVE MV Area (PHT): 4.39 cm              SHUNTS MV PHT:        50.17 msec            Systemic VTI:  0.14 m MV Decel Time: 173 msec              Systemic Diam: 2.10 cm MV E velocity: 103.00 cm/s 103 cm/s MV A velocity: 41.20 cm/s  70.3 cm/s MV E/A ratio:  2.50        1.5  Oswaldo Milian MD Electronically signed by Oswaldo Milian MD Signature Date/Time: 06/25/2019/4:18:14 PM    Final  ECHOCARDIOGRAM COMPLETE  Result Date: 06/19/2019   ECHOCARDIOGRAM REPORT   Patient Name:   Levi Pruitt Date of Exam: 06/19/2019 Medical Rec #:  [id number]   Height:       69.0 in Accession #:    [id number]   Weight:       214.5 lb Date of Birth:  10-14-1945   BSA:          2.13 m Patient Age:    55 years    BP:           126/69 mmHg Patient Gender: M           HR:           69 bpm. Exam Location:  Inpatient Procedure: 2D Echo Indications:    STEMI I21.3  History:        Patient has no prior history of Echocardiogram examinations.                 Stroke. AKI (acute kidney injury)                 Abnormal ECG.  Sonographer:    Vikki Ports Turrentine Referring Phys: Kingsley Littlefield  1. Left ventricular ejection fraction, by visual estimation, is 25 to 30%. The left ventricle has severely decreased function. There is no left ventricular hypertrophy.  2. Elevated left atrial pressure.  3. Left ventricular diastolic parameters are consistent with Grade III diastolic dysfunction (restrictive).  4. Mildly dilated left ventricular internal cavity size.  5. The left ventricle demonstrates regional wall motion abnormalities.  6. Global right ventricle has normal systolic function.The right ventricular size is normal.  7. Left atrial size was normal.  8. Right atrial size was normal.  9. The mitral valve is normal in structure. Mild mitral valve regurgitation. No evidence of mitral stenosis. 10. The tricuspid valve is normal in structure. 11. The aortic valve is tricuspid. Aortic valve regurgitation is not visualized. Mild aortic valve sclerosis without stenosis. 12. The pulmonic valve was not well visualized. Pulmonic valve regurgitation is trivial. 13. The inferior vena cava is dilated in size with >50% respiratory variability, suggesting right atrial pressure of 8 mmHg. 14. Akinesis of the inferior, inferolateral, apical and distal septal walls; overall severe LV dysfunction; restrictive filling; mild LVE; mild MR. FINDINGS  Left Ventricle: Left ventricular ejection fraction, by visual estimation, is 25 to 30%. The left ventricle has severely decreased function. The left ventricle demonstrates regional wall  motion abnormalities. The left ventricular internal cavity size was  mildly dilated left ventricle. There is no left ventricular hypertrophy. Left ventricular diastolic parameters are consistent with Grade III diastolic dysfunction (restrictive). Elevated left atrial pressure. Right Ventricle: The right ventricular size is normal.Global RV systolic function is has normal systolic function. Left Atrium: Left atrial size was normal in size. Right Atrium: Right atrial size was normal in size Pericardium: There is no evidence of pericardial effusion. Mitral Valve: The mitral valve is normal in structure. Mild mitral valve regurgitation. No evidence of mitral valve stenosis by observation. Tricuspid Valve: The tricuspid valve is normal in structure. Tricuspid valve regurgitation is trivial. Aortic Valve: The aortic valve is tricuspid. Aortic valve regurgitation is not visualized. Mild aortic valve sclerosis is present, with no evidence of aortic valve stenosis. Pulmonic Valve: The pulmonic valve was not well visualized. Pulmonic valve regurgitation is trivial. Pulmonic regurgitation is trivial. Aorta: The aortic root is normal in size and structure. Venous: The inferior vena cava is dilated  in size with greater than 50% respiratory variability, suggesting right atrial pressure of 8 mmHg. IAS/Shunts: No atrial level shunt detected by color flow Doppler. Additional Comments: Akinesis of the inferior, inferolateral, apical and distal septal walls; overall severe LV dysfunction; restrictive filling; mild LVE; mild MR.  LEFT VENTRICLE PLAX 2D LVIDd:         5.80 cm       Diastology LVIDs:         4.60 cm       LV e' lateral:   5.33 cm/s LV PW:         0.90 cm       LV E/e' lateral: 22.3 LV IVS:        0.90 cm       LV e' medial:    3.68 cm/s LVOT diam:     2.00 cm       LV E/e' medial:  32.3 LV SV:         69 ml LV SV Index:   31.38 LVOT Area:     3.14 cm  LV Volumes (MOD) LV area d, A2C:    28.50 cm LV area d, A4C:     33.00 cm LV area s, A2C:    23.30 cm LV area s, A4C:    26.30 cm LV major d, A2C:   7.37 cm LV major d, A4C:   7.76 cm LV major s, A2C:   7.40 cm LV major s, A4C:   7.50 cm LV vol d, MOD A2C: 93.2 ml LV vol d, MOD A4C: 119.0 ml LV vol s, MOD A2C: 63.6 ml LV vol s, MOD A4C: 77.4 ml LV SV MOD A2C:     29.6 ml LV SV MOD A4C:     119.0 ml LV SV MOD BP:      37.6 ml RIGHT VENTRICLE RV S prime:     13.70 cm/s TAPSE (M-mode): 2.0 cm LEFT ATRIUM             Index       RIGHT ATRIUM           Index LA diam:        4.10 cm 1.93 cm/m  RA Area:     18.10 cm LA Vol (A2C):   60.5 ml 28.43 ml/m RA Volume:   49.30 ml  23.16 ml/m LA Vol (A4C):   68.4 ml 32.14 ml/m LA Biplane Vol: 64.2 ml 30.16 ml/m  AORTIC VALVE LVOT Vmax:   94.60 cm/s LVOT Vmean:  64.700 cm/s LVOT VTI:    0.149 m  AORTA Ao Root diam: 2.70 cm MITRAL VALVE MV Area (PHT): 3.99 cm              SHUNTS MV PHT:        55.10 msec            Systemic VTI:  0.15 m MV Decel Time: 190 msec              Systemic Diam: 2.00 cm MV E velocity: 119.00 cm/s 103 cm/s MV A velocity: 55.30 cm/s  70.3 cm/s MV E/A ratio:  2.15        1.5  Kirk Ruths MD Electronically signed by Kirk Ruths MD Signature Date/Time: 06/19/2019/3:17:27 PM    Final    CT HEAD CODE STROKE WO CONTRAST  Result Date: 06/18/2019 CLINICAL DATA:  Code stroke. 74 year old male with right side weakness and facial droop. EXAM: CT HEAD WITHOUT CONTRAST TECHNIQUE:  Contiguous axial images were obtained from the base of the skull through the vertex without intravenous contrast. COMPARISON:  None. FINDINGS: Brain: Large area of hypodense cytotoxic edema demonstrated from the mesial left temporal lobe through the left occipital pole in keeping with a large left PCA territory infarct. Mild regional mass effect. No associated hemorrhage. Comparatively mild hypodensity in the left thalamus. Superimposed chronic appearing encephalomalacia in the right temporal and occipital lobes. No MCA territory acute  cortically based infarct changes identified. No acute intracranial hemorrhage identified. No ventriculomegaly. No midline shift and patent basilar cisterns. Vascular: Calcified atherosclerosis at the skull base. No suspicious intracranial vascular hyperdensity. Skull: No acute osseous abnormality identified. Sinuses/Orbits: Well pneumatized paranasal sinuses. Tympanic cavities and mastoids are clear. Other: Visualized orbits and scalp soft tissues are within normal limits. ASPECTS [facility] Stroke Program Early CT Score) Total score (0-10 with 10 being normal): 10, but not applicable in this case given evidence of large acute left PCA infarct. IMPRESSION: 1. Large acute Left PCA territory infarct. No associated hemorrhage. Minor mass effect. 2. No acute anterior circulation infarct is evident (ASPECTS 10). 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 3. These results were communicated to Dr. Lorraine Lax at 2:59 pm on 06/18/2019 by text page via the [facility] messaging system. Electronically Signed   By: Genevie Ann M.D.   On: 06/18/2019 15:02       Discharge Exam: Vitals:   07/07/19 1000 07/07/19 1147  BP: 106/64   Pulse:    Resp:    Temp:  97.7 F (36.5 C)  SpO2:      General: Pt is alert, awake, not in acute distress Cardiovascular: Bradycardic, regular rhythm, rate 59, S1/S2 +, no edema Respiratory: CTA bilaterally, no wheezing, no rhonchi, no respiratory distress, no conversational dyspnea  Abdominal: Soft, NT, ND, bowel sounds + Extremities: no edema, no cyanosis Psych: Stable    The results of significant diagnostics from this hospitalization (including imaging, microbiology, ancillary and laboratory) are listed below for reference.     Microbiology: No results found for this or any previous visit (from the past 240 hour(s)).   Labs: BNP (last 3 results) Recent Labs    06/19/19 1515  BNP [id number]*   Basic Metabolic Panel: Recent Labs  Lab 07/01/19 0253 07/01/19 0253  07/02/19 0212 07/02/19 0212 07/03/19 0429 07/04/19 1048 07/05/19 0341 07/06/19 0747 07/07/19 0215  NA 140   < > 139   < > 138 139 139 138 140  K 3.8   < > 3.6   < > 3.7 3.5 3.3* 3.8 3.5  CL 105   < > 104   < > 101 102 100 104 102  CO2 23   < > 22   < > 25 26 26 23 27   GLUCOSE 94   < > 88   < > 108* 100* 97 107* 85  BUN 17   < > 14   < > 15 18 17 14 12   CREATININE 1.16   < > 1.00   < > 1.25* 1.10 1.10 1.02 1.10  CALCIUM 8.3*   < > 8.2*   < > 8.3* 8.1* 8.4* 8.4* 8.3*  MG 1.9  --  1.9  --  1.9  --  2.1  --  1.9  PHOS 3.6   < > 3.3   < > 3.7 4.0 3.8 3.8 3.7   < > = values in this interval not displayed.   Liver Function Tests: Recent Labs  Lab  07/03/19 0429 07/04/19 1048 07/05/19 0341 07/06/19 0747 07/07/19 0215  ALBUMIN 2.4* 2.1* 2.2* 2.3* 2.2*   No results for input(s): LIPASE, AMYLASE in the last 168 hours. No results for input(s): AMMONIA in the last 168 hours. CBC: Recent Labs  Lab 07/02/19 0212 07/02/19 0212 07/03/19 0429 07/04/19 1048 07/05/19 0341 07/06/19 0747 07/07/19 0215  WBC 14.1*   < > 19.7* 13.2* 15.0* 14.8* 11.9*  NEUTROABS 11.2*  --  16.6* 10.1* 11.3*  --  8.3*  HGB 13.7   < > 14.6 13.1 14.0 14.2 14.4  HCT 42.5   < > 44.3 40.7 43.9 43.2 45.1  MCV 97.5   < > 94.3 96.4 96.1 93.9 96.6  PLT 304   < > 285 262 297 279 281   < > = values in this interval not displayed.   Cardiac Enzymes: No results for input(s): CKTOTAL, CKMB, CKMBINDEX, TROPONINI in the last 168 hours. BNP: Invalid input(s): POCBNP CBG: Recent Labs  Lab 07/06/19 1544 07/06/19 1921 07/06/19 2310 07/07/19 0311 07/07/19 0754  GLUCAP 140* 73 92 91 101*   D-Dimer No results for input(s): DDIMER in the last 72 hours. Hgb A1c No results for input(s): HGBA1C in the last 72 hours. Lipid Profile No results for input(s): CHOL, HDL, LDLCALC, TRIG, CHOLHDL, LDLDIRECT in the last 72 hours. Thyroid function studies No results for input(s): TSH, T4TOTAL, T3FREE, THYROIDAB in the last 72  hours.  Invalid input(s): FREET3 Anemia work up No results for input(s): VITAMINB12, FOLATE, FERRITIN, TIBC, IRON, RETICCTPCT in the last 72 hours. Urinalysis    Component Value Date/Time   COLORURINE YELLOW 06/19/2019 2320   APPEARANCEUR HAZY (A) 06/19/2019 2320   LABSPEC 1.029 06/19/2019 2320   PHURINE 5.0 06/19/2019 2320   GLUCOSEU NEGATIVE 06/19/2019 2320   HGBUR NEGATIVE 06/19/2019 2320   BILIRUBINUR NEGATIVE 06/19/2019 2320   KETONESUR 20 (A) 06/19/2019 2320   PROTEINUR 30 (A) 06/19/2019 2320   NITRITE NEGATIVE 06/19/2019 2320   LEUKOCYTESUR NEGATIVE 06/19/2019 2320   Sepsis Labs Invalid input(s): PROCALCITONIN,  WBC,  LACTICIDVEN Microbiology No results found for this or any previous visit (from the past 240 hour(s)).   Patient was seen and examined on the day of discharge and was found to be in stable condition. Time coordinating discharge: 40 minutes including assessment and coordination of care, as well as examination of the patient.   SIGNED:  Dessa Phi, DO [facility] 07/07/2019, 12:20 PM

## 2019-07-07 NOTE — Progress Notes (Signed)
Nutrition Follow-up  DOCUMENTATION CODES:   Obesity unspecified  INTERVENTION:   Monitor magnesium, potassium, and phosphorus daily for at least 3 days, MD to replete as needed, as pt is at risk for refeeding syndrome.  Once PEG ready to use: -Initiate Osmolite 1.5 @ 20 ml/hr, advance by 10 ml every 4 hours to goal rate of 60 ml/hr. -Free water flushes of 200 ml QID (800 ml) -This will provide 2160 kcals, 90g protein and 1897 ml H2O.  NUTRITION DIAGNOSIS:   Inadequate oral intake related to dysphagia(s/p CVA) as evidenced by NPO status.  Ongoing.  GOAL:   Patient will meet greater than or equal to 90% of their needs  Not meeting.  MONITOR:   PO intake, Supplement acceptance, Diet advancement, Skin, Weight trends, Labs, I & O's  ASSESSMENT:   74 year old Caucasian male, obese, with no other documented past medical history. Patient is not able to provide and history.  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  1/9: admitted for acute CVA 1/11:Small bore feeding tube placed by diagnostic radiology, tip of tube in distal duodenum.  1/12: tube feeding initiated 1/14: TF held d/t aspiration pneumonia 1/18: s/p MBS -moderate oropharyngeal dysphagia w/ silent aspiration 1/26: s/p PEG placed in IR  **RD working remotely**  Per SLP note on 1/25, pt's cough and dysphagia continues. Pt made NPO d/t aspiration.   Pt now with PEG. Per IR note on 1/26, states PEG tube would be ready to use this morning.  Once tube ready to use, RD has placed TF orders. Would advance slowly.  Admission weight: 214 lbs. Current weight: 204 lbs.  Medications: D5 infusion Labs reviewed: CBGs: 91-101  Diet Order:   Diet Order            Diet NPO time specified Except for: Ice Chips  Diet effective  now              EDUCATION NEEDS:   No education needs have been identified at this time  Skin:  Skin Assessment: Reviewed RN Assessment  Last BM:  1/26 -type 7  Height:   Ht Readings from Last 1 Encounters:  06/20/19 5\' 9"  (1.753 m)    Weight:   Wt Readings from Last 1 Encounters:  07/07/19 92.7 kg    Ideal Body Weight:  72.7 kg  BMI:  Body mass index is 30.17 kg/m.  Estimated Nutritional Needs:   Kcal:  2000-2200  Protein:  90-105g  Fluid:  2L/day  Clayton Bibles, MS, RD, LDN Inpatient Clinical Dietitian Pager: [phone] After Hours Pager: [phone]

## 2019-07-07 NOTE — PMR Pre-admission (Signed)
PMR Admission Coordinator Pre-Admission Assessment  Patient: Levi Pruitt is an 74 y.o., male MRN: [id number] DOB: 1945/09/06 Height: 5' 9"  (175.3 cm) Weight: 92.7 kg  Insurance Information HMO:     PPO:      PCP:      IPA:      80/20:      OTHER:  PRIMARY: Medicare A and B      Policy#: [id number]      Subscriber: pt CM Name:       Phone#:      Fax#:  Pre-Cert#: verified eligibility online      Employer:  Benefits:  Phone #:      Name:  Eff. Date: 07/11/2010 A and B     Deduct: $1484      Out of Pocket Max: n/a      Life Max: n/a CIR: 100%      SNF: 20 full days  Outpatient: 80%     Co-Pay: 20% Home Health: 100%      Co-Pay:  DME: 80%     Co-Pay: 20% Providers: pt choice SECONDARY: BCBS Medicare Part D supplement      Policy#:       Subscriber:  CM Name:       Phone#:      Fax#:  Pre-Cert#:       Employer:  Benefits:  Phone #:      Name:  Eff. Date:      Deduct:       Out of Pocket Max:       Life Max:  CIR:       SNF:  Outpatient:      Co-Pay:  Home Health:       Co-Pay:  DME:      Co-Pay:   Medicaid Application Date:       Case Manager:  Disability Application Date:       Case Worker:   The "Data Collection Information Summary" for patients in Inpatient Rehabilitation Facilities with attached "Privacy Act [place] Records" was provided and verbally reviewed with: Patient and Family  Emergency Contact Information Contact Information    Name Relation Home Work [place], [place] Significant other   [phone]   Arminda Resides Daughter   [phone]      Current Medical History  Patient Admitting Diagnosis: L CVAs  History of Present Illness: Pt is a 74 y/o male with no known significant PMH admitted to [facility] on 06/18/19 as a code stroke for slurred speech and flaccid RUE.  Per chart review, daughter spoke to pt in the evening on 1/7 and he appeared normal.  The following day neighbors noticed the papers were piling up on the front porch and called for a  wellness check.  EMS had to force their way in and pt was found in bed with above listed symptoms.  CT showed established large L PCA infarct with CTA confirming P1 occlusion.  Pt was not a TPA candidate as he was outside the window and not a candidate for thrombectomy as infarct was already established. EKG was concerning for ST elevated in inferior leads, cardiology felt patient not a candidate for emergent heart cath due to large CVA and recommended medical management of STEMI.  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  Neuro recommended DAPT x3 months, and then ASA alone.  ECHO revealed severe LV dysfunction with EF of 25-30%  to be managed with low dose coreg and ACE.  Hospital course complicated by acute onset pulmonary edema and aspiration PNA 2/2 difficulty managing secretions.  Also CKD stage II, monitoring renal function.  Pt with PEG placed 1/26 and NPO except ice chips after oral care.  Palliative had been following pt and family elected to pursue all offered and available medical interventions to prolong life.  Therapy evaluations were completed and pt present with profound R hemiparesis and deficits with cognition, swallowing, and speech.  CIR was recommended for aggressive, multidisciplinary therapy to reduce burden of care for d/c to SNF or home with family.   Complete NIHSS TOTAL: 17  Patient's medical record from [facility] has been reviewed by the rehabilitation admission coordinator and physician.  Past Medical History  Past Medical History:  Diagnosis Date  . 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  Prior Rehab/Hospitalizations Has the patient had prior rehab or hospitalizations prior to admission? No  Has the patient had major surgery during 100 days prior to admission? Yes   Current Medications  Current  Facility-Administered Medications:  .   stroke: mapping our early stages of recovery book, , Does not apply, Once, Wouk, Ailene Rud, MD .  acetaminophen (TYLENOL) tablet 650 mg, 650 mg, Oral, Q4H PRN **OR** acetaminophen (TYLENOL) 160 MG/5ML solution 650 mg, 650 mg, Per Tube, Q4H PRN **OR** acetaminophen (TYLENOL) suppository 650 mg, 650 mg, Rectal, Q4H PRN, Wouk, Ailene Rud, MD .  aspirin chewable tablet 324 mg, 324 mg, Per Tube, Daily, Kyle, Tyrone A, DO, 324 mg at 07/07/19 0904 .  atorvastatin (LIPITOR) tablet 80 mg, 80 mg, Per Tube, q1800, Kyle, Tyrone A, DO, 80 mg at 07/04/19 1805 .  carvedilol (COREG) tablet 3.125 mg, 3.125 mg, Per Tube, BID WC, Kyle, Tyrone A, DO, 3.125 mg at 07/07/19 0904 .  dextrose 5 % solution, , Intravenous, Continuous, Lang Snow, Wilcox, Last Rate: 60 mL/hr at 07/07/19 0551, Rate Change at 07/07/19 0551 .  feeding supplement (OSMOLITE 1.5 CAL) liquid 1,000 mL, 1,000 mL, Per Tube, Continuous, Dessa Phi, DO .  heparin injection 5,000 Units, 5,000 Units, Subcutaneous, Q8H, Monia Sabal, PA-C, 5,000 Units at 07/07/19 0551 .  ipratropium-albuterol (DUONEB) 0.5-2.5 (3) MG/3ML nebulizer solution 3 mL, 3 mL, Nebulization, Q6H PRN, Dana Allan I, MD, 3 mL at 07/02/19 2015  Patients Current Diet:  Diet Order            Diet NPO time specified Except for: Ice Chips  Diet effective now              Precautions / Restrictions Precautions Precautions: Fall Precaution Comments: dense R hemiparesis with inattention; NGT; bowel/bladder incontinence Restrictions Weight Bearing Restrictions: No   Has the patient had 2 or more falls or a fall with injury in the past year? No  Prior Activity Level Community (5-7x/wk): independent PLOF, driving, working as a Transport planner Care: Did the patient need help bathing, dressing, using the toilet or eating? Independent  Indoor Mobility: Did the patient need assistance with walking from room  to room (with or without device)? Independent  Stairs: Did the patient need assistance with internal or external stairs (with or without device)? Independent  Functional Cognition: Did the patient need help planning regular tasks such as shopping or remembering to take medications? Independent  Home Assistive Devices / Equipment Home Equipment: None('walking sticks')  Prior Device Use: Indicate devices/aids used by the patient prior to current illness, exacerbation or injury? None of the above  Current Functional Level Cognition  Arousal/Alertness: Lethargic Overall Cognitive Status: Impaired/Different from baseline Difficult to assess due to: Impaired communication(expressive difficulties) Current Attention Level: Focused Orientation Level: Oriented to person Following Commands: Follows one step commands with increased time, Follows one step commands inconsistently Safety/Judgement: Decreased awareness of safety, Decreased awareness of deficits(R sided neglect) General Comments: Pt more lethargic/sleepy today, harder to keep attention, less initiation and ability to stay awake to actively participate Comments: Difficult to assess due to lethargy and language deficits     Extremity Assessment (includes Sensation/Coordination)  Upper Extremity Assessment: Generalized weakness, RUE deficits/detail, LUE deficits/detail RUE Deficits / Details: flaccid, no response to noxious stimuli RUE Sensation: decreased proprioception, decreased light touch RUE Coordination: decreased gross motor, decreased fine motor LUE Deficits / Details: able to move to command, squeeze hand and raise UE; grossly 3+/5 MMT  LUE Sensation: WNL LUE Coordination: WNL  Lower Extremity Assessment: Defer to PT evaluation RLE Deficits / Details: no active movement on command, pt denies sensation. Pt demoed some movement at R ankle, could not clarify if this was reflexive or voluntary. RLE Sensation: decreased light  touch RLE Coordination: decreased fine motor, decreased gross motor LLE Deficits / Details: Pt with limited active movement, 2/5 throughout LLE Sensation: WNL    ADLs  Overall ADL's : Needs assistance/impaired Grooming: Maximal assistance, Sitting, Wash/dry hands, Brushing hair Grooming Details (indicate cue type and reason): seated in recliner, able to wash hands with inital hand over hand assist but sustained attention to wash R UE partially given multimodal cueing; min assist to comb hair with L UE for thoroughness  Toileting- Clothing Manipulation and Hygiene: Total assistance, +2 for safety/equipment, +2 for physical assistance, Bed level Toileting - Clothing Manipulation Details (indicate cue type and reason): total assist for bed level hygiene  Functional mobility during ADLs: Total assistance, +2 for physical assistance, +2 for safety/equipment General ADL Comments: requires max-total assist for all self care    Mobility  Overal bed mobility: Needs Assistance Bed Mobility: Rolling, Sidelying to Sit Rolling: Max assist, +2 for physical assistance Sidelying to sit: Max assist, Total assist, +2 for physical assistance Supine to sit: Max assist, +2 for physical assistance Sit to supine: Total assist, +2 for physical assistance General bed mobility comments: patient able to assist with rolling using L UE/LE given cueing; overall requiring max-total assist +2 due to sequencing, motor planning and initation (as well as weakness and impaired balance)     Transfers  Overall transfer level: Needs assistance Equipment used: 2 person hand held assist Transfer via Lift Equipment: Maximove Transfers: Lateral/Scoot Transfers Sit to Stand: Max assist, +2 physical assistance  Lateral/Scoot Transfers: Total assist, +2 physical assistance, +2 safety/equipment(3rd person in back due to weight) General transfer comment: pt with little initiation and voluntary effort, transfered to the L      Ambulation / Gait / Stairs / Wheelchair Mobility  Ambulation/Gait General Gait Details: unable    Posture / Balance Dynamic Sitting Balance Sitting balance - Comments: worked on trunk control and EOB balance, pt occasionally able to maintain midline with close min guard for about 30 sec,  Balance Overall balance assessment: Needs assistance Sitting-balance support: No upper extremity supported, Feet supported Sitting balance-Leahy Scale: Poor Sitting balance - Comments: worked on trunk control and EOB balance, pt occasionally able to maintain midline with close min guard for about 30 sec,  Postural control: Right lateral lean Standing balance support: Bilateral upper extremity supported Standing balance-Leahy Scale: Zero    Special needs/care consideration BiPAP/CPAP no CPM no Continuous Drip IV no Dialysis no        Days n/a Life Vest no Oxygen on 2-3L in hospital Special Bed may benefit from air mattress  Trach Size n/a Wound Vac (area) no      Location n/a Skin new peg site                       Bowel mgmt: incontinent Bladder mgmt: incontinent Diabetic mgmt: no Behavioral consideration no Chemo/radiation no   Previous Home Environment (from acute therapy documentation) Living Arrangements: Alone  Lives With: Alone Available Help at Discharge: Family, Friend(s) Type of Home: House Home Layout: One level Home Access: Stairs to enter Entrance Stairs-Rails: Right, Left Entrance Stairs-Number of Steps: couple  Bathroom Shower/Tub: Chiropodist: Standard Additional Comments: daughter lives in [place], long time girlfriend works but available PRN   Discharge Living Setting Plans for Discharge Living Setting: Patient's home, Other (Comment)(most likely will d/c to SNF for further rehab) Type of Home at Discharge: [place] Name at Discharge: tbd Discharge Home Layout: One level Discharge Home Access: Stairs to enter Entrance  Stairs-Rails: None(front has BHR w/ grass to access steps, carport has no HR) Entrance Stairs-Number of Steps: 2 from carport, 6 from front Discharge Bathroom Shower/Tub: Tub/shower unit Discharge Bathroom Toilet: Standard Discharge Bathroom Accessibility: Yes How Accessible: Accessible via walker Does the patient have any problems obtaining your medications?: No  Social/Family/Support Systems Anticipated Caregiver: [name] (sig other), Amy (dtr, lives out of state, works remotely) Anticipated Ambulance person Information: [name] [phone], Alice [phone] Ability/Limitations of Caregiver: [name] works, Patent examiner can work from home but lives out of state, other family members work Careers adviser: Other (Comment)(see additional info below) Discharge Plan Discussed with Primary Caregiver: Yes Is Caregiver In Agreement with Plan?: Yes Does Caregiver/Family have Issues with Lodging/Transportation while Pt is in Rehab?: No  Goals/Additional Needs Patient/Family Goal for Rehab: PT/OT min/mod assist, SLP mod asisst Expected length of stay: 21-24 days Dietary Needs: NPO except chips, PEG tube Additional Information: Pt will most likely d/c to SNF for continued rehab following CIR; however, if he makes good progress family could potentially provide 24/7 assist short term.   Pt/Family Agrees to Admission and willing to participate: Yes Program Orientation Provided & Reviewed with Pt/Caregiver Including Roles  & Responsibilities: Yes  Barriers to Discharge: Decreased caregiver support, Home environment access/layout  Decrease burden of Care through IP rehab admission: Specialzed equipment needs, Diet advancement, Decrease number of caregivers, Bowel and bladder program and Patient/family education  Possible need for SNF placement upon discharge: Probably.  Pt and family aware that he will likely still need significant assist at completion of CIR program.  If family unable to provide this  will need SNF placement.   Patient Condition: I have reviewed medical records from [facility], spoken with CM, and pt, significant other, son, daughter and other family members. I met with patient at the bedside and discussed via phone for inpatient rehabilitation assessment.  Patient will benefit from ongoing PT, OT and SLP, can actively participate in 3 hours of therapy a day 5 days of the week, and can make measurable gains during the admission.  Patient will also benefit from the coordinated team approach during an Inpatient Acute Rehabilitation admission.  The patient will receive intensive  therapy as well as Rehabilitation physician, nursing, social worker, and care management interventions.  Due to bladder management, bowel management, safety, skin/wound care, disease management, medication administration, pain management and patient education the patient requires 24 hour a day rehabilitation nursing.  The patient is currently max to total +2 with mobility and basic ADLs.  Discharge setting and therapy post discharge at SNF/home is anticipated.  Patient has agreed to participate in the Acute Inpatient Rehabilitation Program and will admit today.  Preadmission Screen Completed By:  Michel Santee, 07/07/2019 10:17 AM ______________________________________________________________________   Discussed status with Dr. Posey Pronto on 07/07/19  at 10:51 AM  and received approval for admission today.  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 Does the need for close, 24 hr/day Medical supervision in concert with the patient's rehab needs make it unreasonable for this patient to be served in a less intensive setting? Yes  2. Co-Morbidities requiring supervision/potential complications:  Acute CHF with EF of 25-30%, acute onset  pulmonary edema and aspiration PNA 2/2, CKD stage II, dysphagia s/p PEG placed  3. Due to bladder management, bowel management, safety, skin/wound care, disease management, medication administration, pain management and patient education, does the patient require 24 hr/day rehab nursing? Yes 4. Does the patient require coordinated care of a physician, rehab nurse, PT, OT, and SLP to address physical and functional deficits in the context of the above medical diagnosis(es)? Yes Addressing deficits in the following areas: balance, endurance, locomotion, strength, transferring, bowel/bladder control, bathing, dressing, feeding, grooming, toileting, cognition, speech, language, swallowing and psychosocial support 5. Can the patient actively participate in an intensive therapy program of at least 3 hrs of therapy 5 days a week? Potentially 6. The potential for patient to make measurable gains while on inpatient rehab is excellent 7. Anticipated functional outcomes upon discharge from inpatient rehab: mod assist and max assist PT, mod assist and max assist OT, min assist SLP 8. Estimated rehab length of stay to reach the above functional goals is: 27-32 days. 9. Anticipated discharge destination: Other 10. Overall Rehab/Functional Prognosis: fair   MD Signature: Delice Lesch, MD, ABPMR

## 2019-07-08 ENCOUNTER — Inpatient Hospital Stay (HOSPITAL_COMMUNITY): Payer: Medicare Other | Admitting: Physical Therapy

## 2019-07-08 ENCOUNTER — Inpatient Hospital Stay (HOSPITAL_COMMUNITY): Payer: Medicare Other | Admitting: Occupational Therapy

## 2019-07-08 ENCOUNTER — Ambulatory Visit (HOSPITAL_COMMUNITY): Payer: Medicare Other | Attending: Physical Medicine and Rehabilitation

## 2019-07-08 ENCOUNTER — Inpatient Hospital Stay (HOSPITAL_COMMUNITY): Payer: Medicare Other | Admitting: Speech Pathology

## 2019-07-08 DIAGNOSIS — Z8673 Personal history of transient ischemic attack (TIA), and cerebral infarction without residual deficits: Secondary | ICD-10-CM | POA: Insufficient documentation

## 2019-07-08 DIAGNOSIS — R1312 Dysphagia, oropharyngeal phase: Secondary | ICD-10-CM

## 2019-07-08 DIAGNOSIS — R609 Edema, unspecified: Secondary | ICD-10-CM | POA: Insufficient documentation

## 2019-07-08 DIAGNOSIS — Z7189 Other specified counseling: Secondary | ICD-10-CM

## 2019-07-08 DIAGNOSIS — I63532 Cerebral infarction due to unspecified occlusion or stenosis of left posterior cerebral artery: Secondary | ICD-10-CM

## 2019-07-08 DIAGNOSIS — I509 Heart failure, unspecified: Secondary | ICD-10-CM | POA: Insufficient documentation

## 2019-07-08 DIAGNOSIS — Z515 Encounter for palliative care: Secondary | ICD-10-CM

## 2019-07-08 LAB — GLUCOSE, CAPILLARY
Glucose-Capillary: 114 mg/dL — ABNORMAL HIGH (ref 70–99)
Glucose-Capillary: 116 mg/dL — ABNORMAL HIGH (ref 70–99)
Glucose-Capillary: 120 mg/dL — ABNORMAL HIGH (ref 70–99)
Glucose-Capillary: 137 mg/dL — ABNORMAL HIGH (ref 70–99)
Glucose-Capillary: 93 mg/dL (ref 70–99)

## 2019-07-08 LAB — CBC WITH DIFFERENTIAL/PLATELET
Abs Immature Granulocytes: 0.07 10*3/uL (ref 0.00–0.07)
Basophils Absolute: 0.1 10*3/uL (ref 0.0–0.1)
Basophils Relative: 1 %
Eosinophils Absolute: 0.3 10*3/uL (ref 0.0–0.5)
Eosinophils Relative: 2 %
HCT: 42.1 % (ref 39.0–52.0)
Hemoglobin: 14 g/dL (ref 13.0–17.0)
Immature Granulocytes: 1 %
Lymphocytes Relative: 17 %
Lymphs Abs: 1.9 10*3/uL (ref 0.7–4.0)
MCH: 31.2 pg (ref 26.0–34.0)
MCHC: 33.3 g/dL (ref 30.0–36.0)
MCV: 93.8 fL (ref 80.0–100.0)
Monocytes Absolute: 1.4 10*3/uL — ABNORMAL HIGH (ref 0.1–1.0)
Monocytes Relative: 13 %
Neutro Abs: 7.1 10*3/uL (ref 1.7–7.7)
Neutrophils Relative %: 66 %
Platelets: 261 10*3/uL (ref 150–400)
RBC: 4.49 MIL/uL (ref 4.22–5.81)
RDW: 13.4 % (ref 11.5–15.5)
WBC: 10.7 10*3/uL — ABNORMAL HIGH (ref 4.0–10.5)
nRBC: 0 % (ref 0.0–0.2)

## 2019-07-08 LAB — URINE CULTURE: Culture: NO GROWTH

## 2019-07-08 LAB — COMPREHENSIVE METABOLIC PANEL
ALT: 45 U/L — ABNORMAL HIGH (ref 0–44)
AST: 49 U/L — ABNORMAL HIGH (ref 15–41)
Albumin: 2.1 g/dL — ABNORMAL LOW (ref 3.5–5.0)
Alkaline Phosphatase: 88 U/L (ref 38–126)
Anion gap: 14 (ref 5–15)
BUN: 12 mg/dL (ref 8–23)
CO2: 26 mmol/L (ref 22–32)
Calcium: 8.7 mg/dL — ABNORMAL LOW (ref 8.9–10.3)
Chloride: 102 mmol/L (ref 98–111)
Creatinine, Ser: 1.05 mg/dL (ref 0.61–1.24)
GFR calc Af Amer: >60 mL/min (ref >60)
GFR calc non Af Amer: >60 mL/min (ref >60)
Glucose, Bld: 119 mg/dL — ABNORMAL HIGH (ref 70–99)
Potassium: 3.6 mmol/L (ref 3.5–5.1)
Sodium: 142 mmol/L (ref 135–145)
Total Bilirubin: 0.9 mg/dL (ref 0.3–1.2)
Total Protein: 5.5 g/dL — ABNORMAL LOW (ref 6.5–8.1)

## 2019-07-08 LAB — MAGNESIUM: Magnesium: 1.9 mg/dL (ref 1.7–2.4)

## 2019-07-08 LAB — PHOSPHORUS: Phosphorus: 3.6 mg/dL (ref 2.5–4.6)

## 2019-07-08 MED ORDER — GERHARDT'S BUTT CREAM
TOPICAL_CREAM | Freq: Three times a day (TID) | CUTANEOUS | Status: DC
  Administered 2019-07-09 – 2019-07-11 (×2): 1 via TOPICAL
  Filled 2019-07-08 (×3): qty 1

## 2019-07-08 MED ORDER — INSULIN ASPART 100 UNIT/ML ~~LOC~~ SOLN
0.0000 [IU] | SUBCUTANEOUS | Status: DC
  Administered 2019-07-08 – 2019-07-09 (×2): 1 [IU] via SUBCUTANEOUS
  Administered 2019-07-09: 2 [IU] via SUBCUTANEOUS
  Administered 2019-07-09 – 2019-07-11 (×9): 1 [IU] via SUBCUTANEOUS
  Administered 2019-07-11: 2 [IU] via SUBCUTANEOUS
  Administered 2019-07-11 – 2019-07-12 (×4): 1 [IU] via SUBCUTANEOUS
  Administered 2019-07-12: 10:00:00 2 [IU] via SUBCUTANEOUS
  Administered 2019-07-12 – 2019-07-13 (×5): 1 [IU] via SUBCUTANEOUS
  Administered 2019-07-14 – 2019-07-15 (×3): 2 [IU] via SUBCUTANEOUS

## 2019-07-08 MED ORDER — FREE WATER
200.0000 mL | Freq: Four times a day (QID) | Status: DC
  Administered 2019-07-08 – 2019-07-09 (×5): 200 mL

## 2019-07-08 NOTE — Evaluation (Signed)
Physical Therapy Assessment and Plan  Patient Details  Name: Levi Pruitt MRN: [id number] Date of Birth: 1945-07-30  PT Diagnosis: Abnormal posture, Abnormality of gait, Cognitive deficits, Difficulty walking, Hemiplegia dominant, Hypotonia, Impaired cognition, Impaired sensation, Muscle weakness and Paralysis Rehab Potential: Fair ELOS: ~4 weeks   Today's Date: 07/08/2019 PT Individual Time: 1005-1104 PT Individual Time Calculation (min): 59 min    Problem List:  Patient Active Problem List   Diagnosis Date Noted  . Arterial ischemic stroke, PCA (posterior cerebral artery), left, acute ([place]) 07/07/2019  . CHF (congestive heart failure) ([place])   . Dysphagia, post-stroke   . Stenosis of right carotid artery   . Acute respiratory failure with hypoxia ([place])   . Urinary retention   . Weakness generalized   . Palliative care by specialist   . DNR (do not resuscitate) discussion   . Dysphagia   . ST elevation myocardial infarction (STEMI) ([place])   . LV dysfunction   . 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 Abnormal ECG 06/18/2019  . Acute ischemic stroke ([place]) 06/18/2019  . Parotid mass 06/18/2019  . AKI (acute kidney injury) ([place]) 06/18/2019  . Acute ST elevation myocardial infarction (STEMI) of inferior wall [facility])     Past Medical History:  Past Medical History:  Diagnosis Date  . 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 IR GASTROSTOMY TUBE MOD SED  07/06/2019    Assessment & Plan Clinical Impression: Patient is a 74 y.o. year old male in relatively good health (no medical care for 40+ years) who was admitted on 06/18/2019 after he did not show up for work" work-related EMS.  History taken chart review due to cognition.  He was found to have right facial droop, right hemiparesis, and dysarthria.  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  MRI brain revealed acute large left PCA infarct, small acute left frontal lobe infarct and chronic right temporal and right occipital lobe infarct.  Echocardiogram showed ejection fraction of 25-30% with severe decrease in LVEF, grade 3 diastolic dysfunction, akinesis of inferior, inferior lateral, apical and distal septal wall and severe LV dysfunction. He was started on IV Rocephin/Zithromax due to concerns of aspiration PNA on on 06/19/2019.  Cardiology consult for input on abnormal EKG and patient felt to have inferior wall STEMI. Patient not a candidate for intervention and medical management recommended with low-dose Coreg and ACE---Lisinopril DC'd due to soft blood pressures.  He was treated with IV heparin through 1/10 and has been transitioned to ASA/Plavix.  Repeat 2D echo of 1/15 showed improvement in EF 30 to 35% and Dr. Erlinda Hong recommends DAPT X 3 months followed by ASA alone.   He was kept n.p.o. due to lethargy and cortak placed by radiology on 06/22/2019.  He has had issues with fluid overload requiring intermittent diuresis.  Patient continued to have issues with lethargy with difficulty handling secretions and palliative care consulted to discuss [place] as well as for input on PEG placement for nutritional support.  Family elected on full scope of care.  Follow-up chest x-ray 1/15 showed worsening of airspace  opacities RUL and IV antibiotics added due to concerns of aspiration pneumonia.  As mentation improved MBS done on 1/18 and he was started on dysphagia 1 honey liquids.  He did have recurrent episode of hypoxia on 1/23 likely due to aspiration event and treated with Lasix for  diuresis and continues to have ongoing supplemental oxygenation needs.[name]  He continued to have poor p.o. intake therefore PEG was placed on 07/06/2019 by Dr. Laurence Ferrari.  He has been incontinent of bladder and today found to have urinary retention with no urine output for > 18  Hrs--nurse unable to catheterize patient due to BPH.  Therapy ongoing and patient continues to be limited by dense right hemiplegia with right inattention and poor postural reflexes, decreased safety awareness with delayed processing, difficulty sequencing, oral apraxia, dysarthria with language of confusion and cognitive deficits.  Currently requiring +3 assist for pregait activity.  CIR recommended due to functional decline. Patient transferred to CIR on 07/07/2019 .   Patient currently requires max with mobility secondary to muscle weakness, muscle joint tightness and muscle paralysis, decreased cardiorespiratoy endurance, impaired timing and sequencing, abnormal tone, unbalanced muscle activation, decreased coordination and decreased motor planning, decreased midline orientation and decreased attention to right, decreased initiation, decreased attention, decreased awareness, decreased problem solving, decreased safety awareness, decreased memory and delayed processing and decreased sitting balance, decreased standing balance, decreased postural control and decreased balance strategies.  Prior to hospitalization, patient was independent  with mobility and lived with Alone in a House home.  Home access is couple Stairs to enter.  Patient will benefit from skilled PT intervention to maximize safe functional mobility, minimize fall risk and decrease caregiver burden for planned discharge home with 24 hour assist.  Anticipate patient will benefit from follow up [facility] at discharge.  PT - End of Session Activity Tolerance: Tolerates 10 - 20 min activity with multiple rests Endurance Deficit: Yes Endurance Deficit Description: Pt very  lethargic and only able to tolerate a few minutes sitting EOB prior to supine rest break PT Assessment Rehab Potential (ACUTE/IP ONLY): Fair PT Barriers to Discharge: [place] home environment;Home environment access/layout;Decreased caregiver support;Lack of/limited family support;Neurogenic Bowel & Bladder;Nutrition means;Incontinence;Medical stability PT Patient demonstrates impairments in the following area(s): Balance;Perception;Behavior;Safety;Edema;Sensory;Endurance;Skin Integrity;Motor;Nutrition;Pain PT Transfers Functional Problem(s): Bed Mobility;Bed to Chair;Car;Furniture PT Locomotion Functional Problem(s): Ambulation;Wheelchair Mobility;Stairs PT Plan PT Intensity: Minimum of 1-2 x/day ,45 to 90 minutes PT Frequency: 5 out of 7 days PT Duration Estimated Length of Stay: ~4 weeks PT Treatment/Interventions: Ambulation/gait training;Community reintegration;DME/adaptive equipment instruction;Neuromuscular re-education;Psychosocial support;Stair training;UE/LE Strength taining/ROM;Wheelchair propulsion/positioning;Balance/vestibular training;Discharge planning;Functional electrical stimulation;Pain management;Skin care/wound management;Therapeutic Activities;UE/LE Coordination activities;Cognitive remediation/compensation;Disease management/prevention;Functional mobility training;Patient/family education;Therapeutic Exercise;Splinting/orthotics;Visual/perceptual remediation/compensation PT Transfers Anticipated Outcome(s): min assist PT Locomotion Anticipated Outcome(s): TBD if pt will be a functional ambultor - possible short distances with mod assist using LRAD PT Recommendation Follow Up Recommendations: Home health PT;24 hour supervision/assistance Patient destination: Home Equipment Recommended: To be determined  Skilled Therapeutic Intervention Evaluation completed (see details above and below) with education on PT POC and goals and individual treatment initiated with focus on  bed mobility, sitting balance, unsupported sitting tolerance, activity tolerance, and education regarding daily therapy schedule, weekly team meetings, purpose of PT evaluation, and other CIR information. Pt received supine in bed and agreeable to therapy session - pt very lethargic throughout and able to verbally respond to questions but only oriented to person. Pt demonstrates L gaze preference with strong R inattention with pt unable to visually scan R of midline. Rolling R with  mod assist with hand-over-hand assistance for use of L hemibody on bedrail to rotate and rolling L with max assist. Supine>sit to L EOB with +2 max assist for B LE management and trunk upright via logroll technique to increase pt participation - cuing for use of L UE for trunk support. Sitting EOB with L UE support initially max assist for trunk control due to posterior lean progressed to CGA for steadying for short durations. Pt demonstrating increased lethargy sitting EOB therefore performed L lateral scoot transfer EOB with heavy max assist for lifting/pivoting hips with minimal hip clearance from bed. Returned to supine with +2 max assist for B LE management and trunk descent with cuing for increased use of L UE to assist with trunk descent. Pt therapeutically positioned in bed with R hemibody support on pillows. Therapist retrieved TIS w/c with pressure relieving cushion and transfer board for future OOB activity. Upon return to room pt very sleepy but agreeable to sit EOB again. Returned to EOB as described before and tolerated sitting EOB for ~48mnutes; while sitting EOB vital signs assessed and WNL while on RA. Pt left supine in bed therapeutically positioned for R hemibody elevation and pressure relief with needs in reach and bed alarm on. Discussed pt's need of PRAFO with RN.  PT Evaluation Precautions/Restrictions Precautions Precautions: Fall;Other (comment) Precaution Comments: R hemiparesis; inattention,  incontinence Restrictions Weight Bearing Restrictions: No Pain Pain Assessment Pain Scale: 0-10 Pain Score: 0-No pain Home Living/Prior Functioning Home Living Living Arrangements: Other (Comment) Available Help at Discharge: Family;Friend(s);Available PRN/intermittently Type of Home: House Home Access: Stairs to enter [employer] Steps: couple  Entrance Stairs-Rails: Right;Left Home Layout: One level Additional Comments: [place] girlfriend works but available PRN, unsure of home setup as pt reported having a multilevel house  Lives With: Alone Prior Function Level of Independence: Independent with basic ADLs;Independent with homemaking with ambulation;Independent with gait;Independent with transfers  Able to Take Stairs?: Yes Driving: Yes Comments: independent, driving, working (Biochemist, clinical - all of the above information retrieved from chart review due to pt's cognitive impairments Perception  Perception Perception: Impaired Inattention/Neglect: Does not attend to right visual field;Does not attend to right side of body Praxis Praxis: Impaired Praxis Impairment Details: Motor planning  Cognition Overall Cognitive Status: Impaired/Different from baseline Arousal/Alertness: Lethargic Orientation Level: Oriented to person;Disoriented to place;Disoriented to time;Disoriented to situation[place] Attention: Focused;Sustained Focused Attention: Impaired Sustained Attention: Impaired Memory: Impaired Safety/Judgment: Impaired Sensation Sensation Light Touch: Impaired Detail Light Touch Impaired Details: Impaired RLE;Impaired RUE Hot/Cold: Not tested Proprioception: Impaired by gross assessment Stereognosis: Not tested Coordination Gross Motor Movements are Fluid and Coordinated: No Coordination and Movement Description: impaired due to R hemiparesis, R inattention, and impaired motor planning Heel Shin Test: unable  to perform on R due to paresis and unable to perform on L due to motor planning impairments and R inattention Motor  Motor Motor: Hemiplegia;Abnormal postural alignment and control;Abnormal tone Motor - Skilled Clinical Observations: Severe RUE hemiparesis  Mobility Bed Mobility Bed Mobility: Rolling Right;Rolling Left;Supine to Sit;Sit to Supine Rolling Right: Moderate Assistance - Patient 50-74% Rolling Left: Maximal Assistance - Patient 25-49% Supine to Sit: 2 Helpers Sit to Supine: 2 Helpers Locomotion  Gait Ambulation: No Gait Gait: No Stairs / Additional Locomotion Stairs: No Wheelchair Mobility Wheelchair Mobility: No  Trunk/Postural Assessment  Cervical Assessment Cervical Assessment: Exceptions to WFL(cervical protraction with L gaze preference) Thoracic Assessment Thoracic Assessment: Exceptions to WFL(thoracic kyphosis) Lumbar Assessment  Lumbar Assessment: Exceptions to WFL(posterior pelvic tilt in sitting) Postural Control Postural Control: Deficits on evaluation Righting Reactions: Reactions delayed with LOB to the right without attempt to correct  Balance Balance Balance Assessed: Yes Static Sitting Balance Static Sitting - Balance Support: Feet supported;Left upper extremity supported Static Sitting - Level of Assistance: 2: Max assist;4: Min assist;3: Mod assist Dynamic Sitting Balance Dynamic Sitting - Balance Support: Feet supported Dynamic Sitting - Level of Assistance: 2: Max assist;1: +1 Total assist Extremity Assessment      RLE Assessment RLE Assessment: Exceptions to [facility] Passive Range of Motion (PROM) Comments: WNL RLE Strength Right Hip Flexion: 0/5 Right Hip Extension: 1/5 Right Hip ABduction: 0/5 Right Hip ADduction: 0/5 Right Knee Flexion: 0/5 Right Knee Extension: 0/5 Right Ankle Dorsiflexion: 0/5 Right Ankle Plantar Flexion: 0/5 LLE Assessment LLE Assessment: Exceptions to [facility] Passive Range of Motion (PROM) Comments: Limited knee  flexion to ~90degrees LLE Strength Left Hip Flexion: 4-/5 Left Knee Flexion: 2/5 Left Knee Extension: 3-/5 Left Ankle Dorsiflexion: 2-/5 Left Ankle Plantar Flexion: 2/5    Refer to Care Plan for Long Term Goals  Recommendations for other services: None   Discharge Criteria: Patient will be discharged from PT if patient refuses treatment 3 consecutive times without medical reason, if treatment goals not met, if there is a change in medical status, if patient makes no progress towards goals or if patient is discharged from hospital.  The above assessment, treatment plan, treatment alternatives and goals were discussed and mutually agreed upon: by patient  Tawana Scale, PT, DPT 07/08/2019, 7:55 AM

## 2019-07-08 NOTE — Progress Notes (Signed)
Initial Nutrition Assessment  DOCUMENTATION CODES:   Not applicable  INTERVENTION:  Monitor magnesium, potassium, and phosphorus daily for at least 3 days, MD to replete as needed, as pt is at risk for refeeding syndrome.  Continue with the following orders: -Initiate Osmolite 1.5 @ 20 ml/hr, advance by 10 ml every 4 hours to goal rate of 60 ml/hr. -Free water flushes of 200 ml QID (800 ml) -This will provide 2160 kcals, 90g protein and 1897 ml H2O.  NUTRITION DIAGNOSIS:   Inadequate oral intake related to dysphagia as evidenced by NPO status.    GOAL:   Patient will meet greater than or equal to 90% of their needs    MONITOR:   TF tolerance, Skin, Weight trends, Labs, I & O's  REASON FOR ASSESSMENT:   Consult Enteral/tube feeding initiation and management, Assessment of nutrition requirement/status  ASSESSMENT:   Pt with documented PMH. Patient is not able to provide and history.  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  1/9: admitted for acute CVA 1/11:Small bore feeding tube placed by diagnostic radiology, tip of tube in distal duodenum.  1/12: tube feeding initiated 1/14: TF held d/t aspiration pneumonia 1/18: s/p MBS -moderate oropharyngeal dysphagia w/ silent aspiration 1/26: s/p PEG placed in IR  Per SLP note on 1/25, pt's cough and dysphagia continues. Pt made NPO d/t aspiration.   Labs reviewed.  CBGs 93-120  Medications reviewed and include: 11mL free water Q6, SSI,   Pt currently with orders to receive Osmolite 1.5 @ 20 ml/hr, advance by 10 ml every 4 hours to goal rate of 60 ml/hr. Pt currently at 76mL/hour. Per RN, pt seems to be tolerating tube feeding well with plans to increase rate to 54mL at 1200.   Admission wt 214 lbs. Current wt 202.3  lbs.  NUTRITION - FOCUSED PHYSICAL EXAM:    Most Recent Value  Orbital Region  Mild depletion  Upper Arm Region  Moderate depletion  Thoracic and Lumbar Region  No depletion  Buccal Region  Mild depletion  Temple Region  Mild depletion  Clavicle Bone Region  Moderate depletion  Clavicle and Acromion Bone Region  Moderate depletion  Scapular Bone Region  No depletion  Dorsal Hand  Mild depletion  Patellar Region  Mild depletion  Anterior Thigh Region  Moderate depletion  Posterior Calf Region  Moderate depletion  Edema (RD Assessment)  Mild  Hair  Reviewed  Eyes  Reviewed  Mouth  Reviewed  Skin  Reviewed  Nails  Reviewed       Diet Order:   Diet Order            Diet NPO time specified  Diet effective now              EDUCATION NEEDS:   Not appropriate for education at this time  Skin:  Skin Assessment: Reviewed RN Assessment  Last BM:  1/28  Height:   Ht Readings from Last 1 Encounters:  06/20/19 5\' 9"  (1.753 m)    Weight:   Wt Readings from Last 1 Encounters:  07/08/19 91.8 kg    Ideal Body Weight:     BMI:  Body mass index is 29.87 kg/m.  Estimated Nutritional Needs:   Kcal:  2000-2200  Protein:  90-105 grams  Fluid:  2L/day    Larkin Ina, MS,  RD, LDN Pager: [phone] Weekend/After Hours Pager: [phone]

## 2019-07-08 NOTE — Evaluation (Signed)
Speech Language Pathology Assessment and Plan  Patient Details  Name: Levi Pruitt MRN: [id number] Date of Birth: 1946-03-28  SLP Diagnosis: Dysarthria;Speech and Language deficits;Dysphagia;Cognitive Impairments  Rehab Potential: Fair ELOS: 4 weeks    Today's Date: 07/08/2019 SLP Individual Time: 1300-1350 SLP Individual Time Calculation (min): 50 min   Problem List:  Patient Active Problem List   Diagnosis Date Noted  . Arterial ischemic stroke, PCA (posterior cerebral artery), left, acute ([place]) 07/07/2019  . CHF (congestive heart failure) ([place])   . Dysphagia, post-stroke   . Stenosis of right carotid artery   . Acute respiratory failure with hypoxia ([place])   . Urinary retention   . Weakness generalized   . Palliative care by specialist   . DNR (do not resuscitate) discussion   . Dysphagia   . ST elevation myocardial infarction (STEMI) ([place])   . LV dysfunction   . 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 Abnormal ECG 06/18/2019  . Acute ischemic stroke ([place]) 06/18/2019  . Parotid mass 06/18/2019  . AKI (acute kidney injury) ([place]) 06/18/2019  . Acute ST elevation myocardial infarction (STEMI) of inferior wall [facility])    Past Medical History:  Past Medical History:  Diagnosis Date  . 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 IR GASTROSTOMY TUBE MOD SED  07/06/2019    Assessment / Plan / Recommendation Clinical Impression   HPI: Levi Pruitt is a 74 year old male in relatively good health (no medical care for 40+ years) who was admitted on 06/18/2019 after he did not show up for work" work-related EMS.  History taken chart review due to cognition.  He was found to have right facial droop, right hemiparesis, and dysarthria.  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  MRI brain revealed acute large left PCA infarct, small acute left frontal lobe infarct and chronic right temporal and right occipital lobe infarct.  Echocardiogram showed ejection fraction of 25-30% with severe decrease in LVEF, grade 3 diastolic dysfunction, akinesis of inferior, inferior lateral, apical and distal septal wall and severe LV dysfunction. He was started on IV Rocephin/Zithromax due to concerns of aspiration PNA on on 06/19/2019.  Cardiology consult for input on abnormal EKG and patient felt to have inferior wall STEMI. Patient not a candidate for intervention and medical management recommended with low-dose Coreg and ACE---Lisinopril DC'd due to soft blood pressures.  He was treated with IV heparin through 1/10 and has been transitioned to ASA/Plavix.  Repeat 2D echo of 1/15 showed improvement in EF 30 to 35% and Dr. Erlinda Hong recommends DAPT X 3 months followed by ASA alone.   He was kept n.p.o. due to lethargy and cortak placed by radiology on 06/22/2019.  He has had issues with fluid overload requiring intermittent diuresis.  Patient continued to have issues with lethargy with difficulty handling secretions and palliative care consulted to discuss [place] as well as for input on PEG placement for nutritional support.  Family elected on full scope of care.  Follow-up chest x-ray 1/15 showed worsening of airspace opacities RUL and IV antibiotics added due to concerns  of aspiration pneumonia.  As mentation improved MBS done on 1/18 and he was started on dysphagia 1 honey liquids.  He did have recurrent episode of hypoxia on 1/23 likely due to aspiration event and treated with Lasix for diuresis and continues to have ongoing supplemental oxygenation needs.[name]   He continued to have poor p.o. intake therefore PEG was placed on 07/06/2019 by Dr. Laurence Ferrari.  He has been incontinent of bladder and today found to have urinary retention with no urine output for > 18  Hrs--nurse unable to catheterize patient due to BPH.  Therapy ongoing and patient continues to be limited by dense right hemiplegia with right inattention and poor postural reflexes, decreased safety awareness with delayed processing, difficulty sequencing, oral apraxia, dysarthria with language of confusion and cognitive deficits.  Currently requiring +3 assist for pregait activity.  Pt admitted to CIR 07/07/19 and SLP evaluations were completed 07/08/19 with results as follows:  Pt presents with severe dysphagia characterized by poor bolus awareness, weak lingual manipulation of boluses, and suspected delayed swallow. Pt required consistent Max A cues to swallow boluses of honey thick liquids and purees. Thins were not attempted due to recent MBSS with documented aspiration as well as suspected aspiration pneumonia, per PA. No overt s/sx aspiration observed with PO intake, however near end of session pt with persistent coughing; suspect due to decreased secretion management. Thin saliva suctioned from pt's oral cavity. Recommend continue NPO but continue Dys 1 (puree) and honey thick liquid trials with SLP only. Suspect when pt's sustained attention improved, as will his bolus awareness.  Pt presents with severe cognitive impairments most impacting sustained attention, orientation and intellectual awareness, as well as short term memory. Pt followed 1-step directions with 100% accuracy, but accuracy decreased to <50% with 2-step directions. Pt's receptive language believed to be largely in tact, however difficult to fully assess due to attention deficits. Pt likely with higher level word-finding deficits, as semantic and occasional phonemic cues required during confrontation naming tasks, however expressive  language more appropriate during informal tasks and also suspect attention impacted expressive abilities. Pt oriented to person, but not place, season, or situation. Pt's speech is dysarthric with intelligibility reduced to ~60% at the phrase and sentence levels.   Recommend pt receive skilled ST services to address dysphagia, dysarthria, and cognitive impairments as described above in order to reduce burden of care and increase functional independence at d/c.   Skilled Therapeutic Interventions          Bedside swallow and cognitive-linguistic evaluations were administered and results were reviewed with pt and girlfriend who is POA.   SLP Assessment  Patient will need skilled [facility] during CIR admission    Recommendations  SLP Diet Recommendations: NPO;Alternative means - long-term Medication Administration: Via alternative means Oral Care Recommendations: Oral care QID Patient destination: Home Follow up Recommendations: 24 hour supervision/assistance;Home Health SLP Equipment Recommended: None recommended by SLP    SLP Frequency 3 to 5 out of 7 days   SLP Duration  SLP Intensity  SLP Treatment/Interventions 4 weeks  Minumum of 1-2 x/day, 30 to 90 minutes  Cognitive remediation/compensation;Cueing hierarchy;Dysphagia/aspiration precaution training;Environmental controls;Functional tasks;Patient/family education;Therapeutic Activities;Speech/Language facilitation;Internal/external aids    Pain Pain Assessment Pain Scale: Faces Faces Pain Scale: No hurt  Prior Functioning Type of Home: House  Lives With: Alone Available Help at Discharge: Family;Friend(s) Vocation: Full time employment  SLP Evaluation Cognition Overall Cognitive Status: Impaired/Different from baseline Arousal/Alertness: Awake/alert Orientation Level: Oriented to person;Disoriented to place;Disoriented  to time;Disoriented to situation Attention: Sustained Focused Attention:  Impaired Focused Attention Impairment: Functional basic Sustained Attention: Impaired Sustained Attention Impairment: Verbal basic;Functional basic Memory: Impaired Memory Impairment: Decreased recall of new information;Decreased short term memory Decreased Short Term Memory: Verbal basic;Functional basic Immediate Memory Recall: Sock;Blue;Bed Memory Recall Sock: Not able to recall Memory Recall Blue: Not able to recall Memory Recall Bed: Not able to recall Awareness: Impaired Awareness Impairment: Intellectual impairment Problem Solving: Impaired Problem Solving Impairment: Functional basic Behaviors: Restless Safety/Judgment: Impaired  Comprehension Auditory Comprehension Overall Auditory Comprehension: Impaired Yes/No Questions: Impaired Basic Biographical Questions: 76-100% accurate Basic Immediate Environment Questions: 75-100% accurate Commands: Impaired One Step Basic Commands: 75-100% accurate Two Step Basic Commands: 25-49% accurate Multistep Basic Commands: Not tested Conversation: Simple Visual Recognition/Discrimination Discrimination: Not tested Reading Comprehension Reading Status: Impaired Word level: Impaired Sentence Level: Not tested Paragraph Level: Not tested Functional Environmental (signs, name badge): Not tested Interfering Components: Attention;Right neglect/inattention Expression Expression Primary Mode of Expression: Verbal Verbal Expression Overall Verbal Expression: Impaired Automatic Speech: Name;Social Response;Counting Level of Generative/Spontaneous Verbalization: Sentence Repetition: No impairment Naming: Impairment Responsive: 76-100% accurate Confrontation: Impaired(suspect mainly due to attention deficits) Convergent: Not tested Divergent: Not tested Verbal Errors: Perseveration Pragmatics: No impairment Interfering Components: Attention;Speech intelligibility Effective Techniques: Phonemic cues;Sentence completion Written  Expression Dominant Hand: Right Oral Motor Oral Motor/Sensory Function Overall Oral Motor/Sensory Function: Moderate impairment Facial ROM: Suspected CN VII (facial) dysfunction;Reduced right Facial Symmetry: Abnormal symmetry right;Suspected CN VII (facial) dysfunction Facial Strength: Reduced right;Suspected CN VII (facial) dysfunction Lingual ROM: Reduced right;Suspected CN XII (hypoglossal) dysfunction Lingual Symmetry: Abnormal symmetry right Motor Speech Overall Motor Speech: Appears within functional limits for tasks assessed Respiration: Within functional limits Phonation: Normal Resonance: Within functional limits Articulation: Impaired Level of Impairment: Word Intelligibility: Intelligibility reduced Word: 75-100% accurate Phrase: 75-100% accurate Sentence: 50-74% accurate Conversation: 50-74% accurate Motor Planning: Witnin functional limits   Intelligibility: Intelligibility reduced Word: 75-100% accurate Phrase: 75-100% accurate Sentence: 50-74% accurate Conversation: 50-74% accurate  Bedside Swallowing Assessment General Date of Onset: 06/19/19 Previous Swallow Assessment: MBSS 06/21/19 Diet Prior to this Study: PEG tube;NPO Temperature Spikes Noted: N/A Respiratory Status: Room air History of Recent Intubation: No Behavior/Cognition: Lethargic/Drowsy;Cooperative;Requires cueing Oral Cavity - Dentition: Adequate natural dentition Self-Feeding Abilities: Total assist Patient Positioning: Upright in bed Baseline Vocal Quality: Normal Volitional Cough: Weak Volitional Swallow: Unable to elicit  Oral Care Assessment Does patient have any of the following "high(er) risk" factors?: Nutritional status - fluids only or NPO for >24 hours;Diet - patient on tube feedings Patient is HIGH RISK: Non-ventilated: Order set for Adult Oral Care Protocol initiated - "High Risk Patients - Non-Ventilated" option selected  (see row information) Patient is AT RISK: Order set  for Adult Oral Care Protocol initiated -  "At Risk Patients" option selected (see row information) Ice Chips Ice chips: Not tested Thin Liquid Thin Liquid: Not tested Nectar Thick Nectar Thick Liquid: Not tested Honey Thick Honey Thick Liquid: Impaired Presentation: Spoon Oral Phase Impairments: Reduced lingual movement/coordination;Poor awareness of bolus Oral Phase Functional Implications: Prolonged oral transit Pharyngeal Phase Impairments: Multiple swallows;Suspected delayed Swallow Puree Puree: Impaired Presentation: Spoon Oral Phase Impairments: Reduced lingual movement/coordination;Poor awareness of bolus Oral Phase Functional Implications: Prolonged oral transit;Oral residue Pharyngeal Phase Impairments: Suspected delayed Swallow;Multiple swallows;Decreased hyoid-laryngeal movement Solid Solid: Not tested BSE Assessment Risk for Aspiration Impact on safety and function: Severe aspiration risk Other Related Risk Factors: Previous CVA;Decreased respiratory status;Deconditioning;Cognitive impairment;Decreased management of secretions;Lethargy  Short Term Goals: Week 1: SLP Short Term  Goal 1 (Week 1): Pt will consume therapeutic trials of puree and honey thick boluses demonstrating appropriate bolus awareness, lingual manipulation, and AP transit with Max A cues. SLP Short Term Goal 2 (Week 1): Pt will sustain attention to functional and familiar tasks for 2 minute intervals with Max A multimodal cues. SLP Short Term Goal 3 (Week 1): Pt will follow 2-step directions with 50% accuracy provided Max A multimodal cues. SLP Short Term Goal 4 (Week 1): Pt will orient to time and plcae with Max A verbal/visual cues. SLP Short Term Goal 5 (Week 1): Pt will use strategies to achieve 75% intelligibilty at the phrase level with Max A cues.  Refer to Care Plan for Long Term Goals  Recommendations for other services: None   Discharge Criteria: Patient will be discharged from SLP if  patient refuses treatment 3 consecutive times without medical reason, if treatment goals not met, if there is a change in medical status, if patient makes no progress towards goals or if patient is discharged from hospital.  The above assessment, treatment plan, treatment alternatives and goals were discussed and mutually agreed upon: by patient  Arbutus Leas 07/08/2019, 3:44 PM

## 2019-07-08 NOTE — Plan of Care (Signed)
  Problem: Consults Goal: RH STROKE PATIENT EDUCATION Description: See Patient Education module for education specifics  Outcome: Progressing   Problem: RH BOWEL ELIMINATION Goal: RH STG MANAGE BOWEL WITH ASSISTANCE Description: STG Manage Bowel with min Assistance. Outcome: Progressing Goal: RH STG MANAGE BOWEL W/MEDICATION W/ASSISTANCE Description: STG Manage Bowel with Medication with min Assistance. Outcome: Progressing   Problem: RH BLADDER ELIMINATION Goal: RH STG MANAGE BLADDER WITH ASSISTANCE Description: STG Manage Bladder With min/mod Assistance Outcome: Progressing   Problem: RH SKIN INTEGRITY Goal: RH STG SKIN FREE OF INFECTION/BREAKDOWN Description: Patients skin will remain free from further infection or breakdown with mod assist. Outcome: Progressing Goal: RH STG MAINTAIN SKIN INTEGRITY WITH ASSISTANCE Description: STG Maintain Skin Integrity With mod Assistance. Outcome: Progressing Goal: RH STG ABLE TO PERFORM INCISION/WOUND CARE W/ASSISTANCE Description: STG Able To Perform Incision/Wound Care With mod Assistance. Outcome: Progressing   Problem: RH SAFETY Goal: RH STG ADHERE TO SAFETY PRECAUTIONS W/ASSISTANCE/DEVICE Description: STG Adhere to Safety Precautions With min Assistance/Device. Outcome: Progressing   Problem: RH COGNITION-NURSING Goal: RH STG USES MEMORY AIDS/STRATEGIES W/ASSIST TO PROBLEM SOLVE Description: STG Uses Memory Aids/Strategies With min Assistance to Problem Solve. Outcome: Progressing   Problem: RH PAIN MANAGEMENT Goal: RH STG PAIN MANAGED AT OR BELOW PT'S PAIN GOAL Description: < 3 Outcome: Progressing   Problem: RH KNOWLEDGE DEFICIT Goal: RH STG INCREASE KNOWLEDGE OF HYPERTENSION Description: Patient and family will verbalize understanding of HTN management including monitoring, diet, exercise, medications, and follow up care with min assist. Outcome: Progressing Goal: RH STG INCREASE KNOWLEGDE OF HYPERLIPIDEMIA Description:  Patient and family will verbalize understanding of HLD management including monitoring, diet, exercise, medications, and follow up care with min assist. Outcome: Progressing Goal: RH STG INCREASE KNOWLEDGE OF STROKE PROPHYLAXIS Description: Patient and family will verbalize understanding of stroke management including monitoring, diet, exercise, medications, and follow up care with min assist. Outcome: Progressing

## 2019-07-08 NOTE — Progress Notes (Signed)
Patient ID: Levi Pruitt, male   DOB: March 10, 1946, 74 y.o.   MRN: [id number]  This NP visited patient at the bedside as a follow up for  Palliative  Medicine  needs and emotional support.    Patient is now on the CIR unit and moving forward with rehabilitation.  PEG tube is in use and being tolerated well   He remains weak, speech is an audible.  He attempts to follow simple commands/stick out your tongue.  Met with  SO/Levi Pruitt/[name], for continued conversation regarding diagnosis, prognosis, treatment option decisions, advanced directive decisions,  disposition and anticipatory care needs.  We discussed the difference between an aggressive medical intervention path and a palliative comfort path.  We discussed the concept of quality over quantity of life.   I continue to share my concern for long term poor prognosis and meaningful recovery within the context of patient's AD and patient's values.  Patient has a documented [name] and a Declaration of a Desire for a Natrual Death    [name] understands and remains hopeful for improvement while he attempts the opportunity for rehabilitation in [place] [facility] of Care: -Full code-   again  recommended DNR/DNI understanding poor  outcomes in similar patients -family is open to all offered and available medical interventions to prolong life.  They are hopeful for meaningful recovery   Discussed with [name] the importance of continued conversation with family and the  medical providers regarding overall plan of care and treatment options,  ensuring decisions are within the context of the patients values and GOCs.   Part choices booklet and MOST form again reviewed  Education and emotional support offered  Questions and concerns addressed     This nurse practitioner informed  the family  that I will be out of the hospital until Monday morning.  If the patient is still hospitalized I will follow-up at that time.  Call palliative medicine team phone #  [phone] with questions or concerns.  Total time spent on the unit was 45  minutes  Greater than 50% of the time was spent in counseling and coordination of care  [name] NP  Palliative Medicine Team Team Phone # [phone] Pager [phone]

## 2019-07-08 NOTE — Care Management (Signed)
Patient Details  Name: Levi Pruitt MRN: [id number] Date of Birth: [birth year]  Today's Date: 07/08/2019  Problem List:  Patient Active Problem List   Diagnosis Date Noted  . Arterial ischemic stroke, PCA (posterior cerebral artery), left, acute ([place]) 07/07/2019  . CHF (congestive heart failure) ([place])   . Dysphagia, post-stroke   . Stenosis of right carotid artery   . Acute respiratory failure with hypoxia ([place])   . Urinary retention   . Weakness generalized   . Palliative care by specialist   . DNR (do not resuscitate) discussion   . Dysphagia   . ST elevation myocardial infarction (STEMI) ([place])   . LV dysfunction   . 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 Abnormal ECG 06/18/2019  . Acute ischemic stroke ([place]) 06/18/2019  . Parotid mass 06/18/2019  . AKI (acute kidney injury) ([place]) 06/18/2019  . Acute ST elevation myocardial infarction (STEMI) of inferior wall [facility])    Past Medical History:  Past Medical History:  Diagnosis Date  . 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 IR GASTROSTOMY TUBE MOD SED  07/06/2019   Social History:  has no history on file for tobacco, alcohol, and drug.  Family / Support Systems Spouse/Significant Other: Friend: Bonita Quin Children: Daughter Alice Wacker Anticipated Caregiver: [name] Ability/Limitations of Caregiver: [name] works; daughter [name] lives in [place], other family and friends will assist Caregiver Availability: Other (Comment)(after work)  Social History Preferred language: English Religion:  Read: Yes Write: Yes Employment Status: Employed Name of Employer: [employer]   Abuse/Neglect Abuse/Neglect Assessment Can Be Completed: Yes Physical Abuse: Denies Verbal Abuse: Denies Sexual Abuse: Denies Exploitation of patient/patient's resources: Denies Self-Neglect: Denies  Emotional  Status Pt's affect, behavior and adjustment status: Alert, flat affect, , rt side facial droop, normal mood  Patient / Family Perceptions, Expectations & Goals Pt/Family understanding of illness & functional limitations: Girlfriend appears to have a good understanding of current health status and functional limitations Premorbid pt/family roles/activities: Father, significant other, friend Anticipated changes in roles/activities/participation: Independent prior to admission, will need assistance at discharge Pt/family expectations/goals: Family and friend would like for the patient to be functioning like he was before however understand that it will take time and expect him to get to a level he could get around with some assistance from friend or hired caregiver  Occupational psychologist available at discharge: Friend will provide transportation at discharge  Discharge Planning Living Arrangements: Other (Comment) Support Systems: Friends/neighbors, Children Does the patient have any problems obtaining your medications?: No Home Management: Friend and family will manage the home/hired assistance Patient/Family Preliminary Plans: Plan to discharge to a skilled facility unless the patient makes progress to be able to be manage with minimal assistance Sw Barriers to Discharge: Decreased caregiver support, Inaccessible home environment, Lack of/limited family support Sw Barriers to Discharge Comments: Lived alone prior to admission Social Work Anticipated Follow Up Needs: HH/OP, SNF DC Planning Additional Notes/Comments: Discussed with friend the need to identify a PCP for discharge Expected length of stay: 4 weeks  Clinical Impression Patient awake, alert, attempted to converse however speech is garbled and difficult to understand. Noted he was "tired", when asked if there were any questions; he attempted to ask a question however unable to understand what he said. Significant other;  [name], noted she understood the current health situation, the patient had lived alone  prior to admission and did not ever go to a doctor. The daughter and she had discussed the patient's plan for discharge and they are looking for SNF to give more time for recovery with the goal to return home with family/friends assisting with care. The friend noted she was familiar with people having strokes and understood each are different. Her father recovered well over time and she provided care to him, so she feels comfortable with post stroke patient care. [name] notes she and family have a good understanding of the current situation and functional limitations of the patient.  Dorien Chihuahua B 07/08/2019, 4:45 PM

## 2019-07-08 NOTE — Progress Notes (Signed)
Occupational Therapy Session Note  Patient Details  Name: Levi Pruitt MRN: [id number] Date of Birth: 1946-03-21  Today's Date: 07/08/2019 OT Individual Time: 1445-1514 OT Individual Time Calculation (min): 29 min    Short Term Goals: Week 1:  OT Short Term Goal 1 (Week 1): Pt will complete UB bathing in supported sitting with min assist and mod instructional cueing. OT Short Term Goal 2 (Week 1): Pt will complete LB bathing sit to stand with total +2 (pt 40%). OT Short Term Goal 3 (Week 1): Pt will locate 2 grooming or bathing items right of midline with no more than mod instructional cueing. OT Short Term Goal 4 (Week 1): Pt will complete squat pivot transfer to the drop arm commode with max assist. OT Short Term Goal 5 (Week 1): Pt will donn a pullover shirt with mod assist in supported sitting.  Skilled Therapeutic Interventions/Progress Updates:  Upon entering the room, pt supine in bed with caregiver present in the room. Pt required total A for supine >sit. Pt sitting on EOB with min - max A secondary to fatigue with static sitting balance on EOB for 10 minutes. Pt required facilitation for forward head and working on visual scanning to the R to locate items and track caregiver. Pt needing assistance with head turn. Sit >supine with total A and total A to reposition. OT educated caregiver on OT purpose, POC, and inpatient rehab. Caregiver verbalized understanding. Bed alarm activated and call bell within reach.    Therapy Documentation Precautions:  Precautions Precautions: Fall, Other (comment) Precaution Comments: right hemiparesis, right inattention, right visual field deficit Restrictions Weight Bearing Restrictions: No General:   Vital Signs: Therapy Vitals Temp: (!) 97.4 F (36.3 C) Pulse Rate: 65 Resp: 16 BP: 129/69 Patient Position (if appropriate): Lying Oxygen Therapy SpO2: 93 % O2 Device: Room Air Pain: Pain Assessment Pain Scale: Faces Faces Pain Scale: No  hurt ADL: ADL Eating: Dependent Where Assessed-Eating: Bed level Grooming: Maximal assistance Where Assessed-Grooming: Bed level Upper Body Bathing: Maximal assistance Where Assessed-Upper Body Bathing: Bed level Lower Body Bathing: Dependent Where Assessed-Lower Body Bathing: Bed level Upper Body Dressing: Dependent Where Assessed-Upper Body Dressing: Edge of bed Lower Body Dressing: Dependent Where Assessed-Lower Body Dressing: Bed level Toileting: Dependent Where Assessed-Toileting: Glass blower/designer: Dependent Armed forces technical officer Method: Other (comment)(bedpan)    Praxis Praxis: Impaired(to be looked at further in treatment) Exercises:   Other Treatments:     Therapy/Group: Individual Therapy  Gypsy Decant 07/08/2019, 4:53 PM

## 2019-07-08 NOTE — Progress Notes (Signed)
Inpatient Rehabilitation  Patient information reviewed and entered into eRehab system by Ahniya Mitchum M. Daryana Whirley, M.A., CCC/SLP, PPS Coordinator.  Information including medical coding, functional ability and quality indicators will be reviewed and updated through discharge.    

## 2019-07-08 NOTE — Care Management (Signed)
Inpatient Rehabilitation Center Individual Statement of Services  Patient Name:  Levi Pruitt  Date:  07/08/2019  Welcome to the [facility].  Our goal is to provide you with an individualized program based on your diagnosis and situation, designed to meet your specific needs.  With this comprehensive rehabilitation program, you will be expected to participate in at least 3 hours of rehabilitation therapies Monday-Friday, with modified therapy programming on the weekends.  Your rehabilitation program will include the following services:  Physical Therapy (PT), Occupational Therapy (OT), Speech Therapy (ST), 24 hour per day rehabilitation nursing, Therapeutic Recreaction (TR), Neuropsychology, Case Management (Social Worker), Rehabilitation Medicine, Nutrition Services and Pharmacy Services  Weekly team conferences will be held on Wednesdays to discuss your progress.  Your Social Worker will talk with you frequently to get your input and to update you on team discussions.  Team conferences with you and your family in attendance may also be held.  Expected length of stay: 4 weeks Overall anticipated outcome: Moderate to maximal assist overall  Depending on your progress and recovery, your program may change. Your Social Worker will coordinate services and will keep you informed of any changes. Your Social Worker's name and contact numbers are listed  below.  The following services may also be recommended but are not provided by the [facility]:    [place] will be made to provide these services after discharge if needed.  Arrangements include referral to agencies that provide these services.  Your insurance has been verified to be:   Medicare A+B/BCBS Medicare Part B  Your primary doctor is:  No primary MD  Pertinent information will be shared with your doctor and your  insurance company.  Social Worker:  Rosa Sanchez, [place] or ([phone]   Information discussed with and copy given to patient by: Margarito Liner, 07/08/2019, 3:54 PM

## 2019-07-08 NOTE — Progress Notes (Signed)
Bilateral lower extremity venous duplex completed.  Preliminary results can be found under CV proc under chart review.  07/08/2019 4:30 PM  [place]ona Wargo, K., RDMS, RVT

## 2019-07-08 NOTE — Evaluation (Signed)
Occupational Therapy Assessment and Plan  Patient Details  Name: Levi Pruitt MRN: [id number] Date of Birth: Nov 26, 1945  OT Diagnosis: abnormal posture, cognitive deficits, disturbance of vision, flaccid hemiplegia and hemiparesis and muscle weakness (generalized) Rehab Potential: Rehab Potential (ACUTE ONLY): Good ELOS: 24-26 days   Today's Date: 07/08/2019 OT Individual Time: 0800-0902 OT Individual Time Calculation (min): 62 min     Problem List:  Patient Active Problem List   Diagnosis Date Noted  . Arterial ischemic stroke, PCA (posterior cerebral artery), left, acute ([place]) 07/07/2019  . CHF (congestive heart failure) ([place])   . Dysphagia, post-stroke   . Stenosis of right carotid artery   . Acute respiratory failure with hypoxia ([place])   . Urinary retention   . Weakness generalized   . Palliative care by specialist   . DNR (do not resuscitate) discussion   . Dysphagia   . ST elevation myocardial infarction (STEMI) ([place])   . LV dysfunction   . 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 Abnormal ECG 06/18/2019  . Acute ischemic stroke ([place]) 06/18/2019  . Parotid mass 06/18/2019  . AKI (acute kidney injury) ([place]) 06/18/2019  . Acute ST elevation myocardial infarction (STEMI) of inferior wall [facility])     Past Medical History:  Past Medical History:  Diagnosis Date  . 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 IR GASTROSTOMY TUBE MOD SED  07/06/2019    Assessment & Plan Clinical Impression: Patient is a 74 y.o. year old male with recent admission to the hospital on 06/18/2019 after he did not show up for work" work-related EMS.  History taken chart review due to cognition.  He was found to have right facial droop, right hemiparesis, and dysarthria.  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  MRI brain revealed acute large left PCA infarct, small acute left frontal lobe infarct and chronic right temporal and right occipital lobe infarct.  Echocardiogram showed ejection fraction of 25-30% with severe decrease in LVEF, grade 3 diastolic dysfunction, akinesis of inferior, inferior lateral, apical and distal septal wall and severe LV dysfunction. He was started on IV Rocephin/Zithromax due to concerns of aspiration PNA on on 06/19/2019.  Cardiology consult for input on abnormal EKG and patient felt to have inferior wall STEMI.  Patient transferred to CIR on 07/07/2019 .    Patient currently requires total +2 with basic self-care skills secondary to muscle weakness, impaired timing and sequencing, unbalanced muscle activation and decreased coordination, decreased visual perceptual skills, decreased visual motor skills and field cut, decreased attention to right and right side neglect, decreased initiation, decreased attention, decreased awareness, decreased problem solving, decreased safety awareness, decreased memory and delayed processing and decreased sitting balance, decreased standing balance, decreased postural control, hemiplegia and decreased balance strategies.  Prior to hospitalization, patient could complete ADLs with independent .  Patient will benefit from skilled intervention to decrease level of assist with basic self-care skills and increase independence with basic self-care skills prior to discharge home with care partner.  Anticipate patient will require moderate physical assestance and follow up home health.  OT -  End of Session Activity Tolerance: Decreased this session Endurance Deficit: Yes Endurance  Deficit Description: Pt fatigues with sitting EOB during OT session OT Assessment Rehab Potential (ACUTE ONLY): Good OT Barriers to Discharge: Decreased caregiver support OT Barriers to Discharge Comments: Pt currently does not have 24 hr supervision OT Patient demonstrates impairments in the following area(s): Balance;Cognition;Edema;Endurance;Motor;Vision;Sensory;Safety;Perception OT Basic ADL's Functional Problem(s): Eating;Grooming;Bathing;Dressing;Toileting OT Transfers Functional Problem(s): Toilet;Tub/Shower OT Additional Impairment(s): Fuctional Use of Upper Extremity OT Plan OT Intensity: Minimum of 1-2 x/day, 45 to 90 minutes OT Frequency: 5 out of 7 days OT Duration/Estimated Length of Stay: 24-26 days OT Treatment/Interventions: Balance/vestibular training;DME/adaptive equipment instruction;Patient/family education;Therapeutic Activities;Self Care/advanced ADL retraining;Functional electrical stimulation;Discharge planning;Cognitive remediation/compensation;Disease mangement/prevention;Functional mobility training;Neuromuscular re-education;UE/LE Strength taining/ROM;Therapeutic Exercise;UE/LE Coordination activities OT Self Feeding Anticipated Outcome(s): min assist OT Basic Self-Care Anticipated Outcome(s): Mod assist OT Toileting Anticipated Outcome(s): mod assist OT Bathroom Transfers Anticipated Outcome(s): mod assist OT Recommendation Patient destination: Home Follow Up Recommendations: Home health OT;24 hour supervision/assistance Equipment Recommended: To be determined   Skilled Therapeutic Intervention Pt in bed to start session with nursing present to administer medications.  He was able to state the current city when given three choices but could not state one before that.  He was not oriented to location as he chose "[place]" when given three choices again. He was not aware of his situation or any deficits when asked.  He agreed to participation in ADL tasks.  He  was able to complete rolling side to side in the bed with total assist for washing his buttocks and donning a new brief.  He needed total assist for supine to sit EOB for completion of bathing and dressing.  He needed max assist for initial sitting balance but progressed to min assist with the LUE supported.  He needed max assist for dynamic sitting balance as well as max demonstrational cueing for scanning left of midline for locating his washcloth and bathing items.  He needed max to total assist for all dressing tasks sit to stand, with pt demonstrating only partial standing for less than 10 seconds.  Total assist for transfer from sit to supine after dressing with pt positioned in bed with soft touch call button and phone in reach and bed alarm in place.  RUE positioned on pillows as well as the RLE.      OT Evaluation Precautions/Restrictions  Precautions Precautions: Fall;Other (comment) Precaution Comments: right hemiparesis, right inattention, right visual field deficit Restrictions Weight Bearing Restrictions: No   Vital Signs Therapy Vitals Pulse Rate: 64 BP: (!) 118/91 Patient Position (if appropriate): Sitting Oxygen Therapy SpO2: 91 % O2 Device: Room Air Pain Pain Assessment Pain Scale: 0-10 Pain Score: 0-No pain Home Living/Prior Functioning Home Living Available Help at Discharge: Family, Friend(s) Type of Home: House Home Access: Stairs to enter Additional Comments: daughter lives in [place], long time girlfriend works but available PRN, unsure of home setup as pt reported having a multilevel house  Lives With: Alone IADL History Homemaking Responsibilities: Yes Current License: Yes Occupation: Full time employment 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, maintenance (IT) Level of Independence: Independent with basic ADLs  Able to Take Stairs?: Yes Driving: Yes Vocation: Full time employment Comments: independent, driving, working Biochemist, clinical)  ADL ADL Eating:  Dependent Where Assessed-Eating: Bed level Grooming: Maximal assistance Where Assessed-Grooming: Bed level Upper Body Bathing: Maximal assistance Where Assessed-Upper Body Bathing: Bed level Lower Body Bathing: Dependent Where Assessed-Lower Body Bathing: Bed level Upper Body Dressing: Dependent Where Assessed-Upper  Body Dressing: Edge of bed Lower Body Dressing: Dependent Where Assessed-Lower Body Dressing: Bed level Toileting: Dependent Where Assessed-Toileting: Glass blower/designer: Dependent Armed forces technical officer Method: Other (comment)(bedpan) Vision Baseline Vision/History: Wears glasses Wears Glasses: At all times Patient Visual Report: No change from baseline Vision Assessment?: Vision impaired- to be further tested in functional context Additional Comments: Pt with decreased tracking in all quadrants with severe right visual field deficit.  He keeps his head turned to the right at rest, but would turn his eyes and head to therapist when given max instructional cueing. Perception  Perception: Impaired Inattention/Neglect: Does not attend to right visual field;Does not attend to right side of body Praxis Praxis: Impaired(to be looked at further in treatment) Cognition Overall Cognitive Status: Impaired/Different from baseline Arousal/Alertness: Awake/alert Orientation Level: Person;Place;Situation Person: Disoriented Place: Disoriented Situation: Disoriented Year: Other (Comment)(1945) Month: (1949) Day of Week: Incorrect Memory: Impaired Immediate Memory Recall: Sock;Blue;Bed Memory Recall Sock: Not able to recall Memory Recall Blue: Not able to recall Memory Recall Bed: Not able to recall Attention: Focused;Sustained Focused Attention: Impaired Focused Attention Impairment: Functional basic Sustained Attention: Impaired Sustained Attention Impairment: Functional basic Awareness: Impaired Awareness Impairment: Intellectual impairment Problem Solving:  Impaired Problem Solving Impairment: Functional basic Safety/Judgment: Impaired Sensation Sensation Light Touch: Impaired Detail Light Touch Impaired Details: Impaired RLE;Impaired RUE Hot/Cold: Not tested Proprioception: Impaired by gross assessment Stereognosis: Not tested Additional Comments: Sensation difficult to accurately assess but when asked which finger therapist was squeezing on the right hand, he was unable to state. Coordination Gross Motor Movements are Fluid and Coordinated: No Fine Motor Movements are Fluid and Coordinated: No Coordination and Movement Description: Brunnstrum stage I movement in the right arm and hand.  No active movement noted at all Heel Shin Test: unable to perform on R due to paresis and unable to perform on L due to motor planning impairments and R inattention Motor  Motor Motor: Hemiplegia Motor - Skilled Clinical Observations: Severe RUE hemiparesis Mobility  Bed Mobility Bed Mobility: Supine to Sit Rolling Right: Maximal Assistance - Patient 25-49% Rolling Left: Total Assistance - Patient < 25% Transfers Sit to Stand: Total Assistance - Patient < 25% Stand to Sit: 2 Helpers  Trunk/Postural Assessment  Cervical Assessment Cervical Assessment: Exceptions to WFL(forward cervical protraction with head turn to the right) Thoracic Assessment Thoracic Assessment: Exceptions to WFL(thoracic kyphosis) Lumbar Assessment Lumbar Assessment: Exceptions to WFL(posterior pelvic tilt in sitting) Postural Control Postural Control: Deficits on evaluation Righting Reactions: Reactions delayed with LOB to the right without attempt to correct.  Balance Balance Balance Assessed: Yes Static Sitting Balance Static Sitting - Balance Support: Left upper extremity supported Static Sitting - Level of Assistance: 3: Mod assist Dynamic Sitting Balance Dynamic Sitting - Balance Support: Feet supported Dynamic Sitting - Level of Assistance: 1: +1 Total  assist Static Standing Balance Static Standing - Balance Support: During functional activity Static Standing - Level of Assistance: 1: +2 Total assist;Patient percentage (comment)(20%) Extremity/Trunk Assessment RUE Assessment RUE Assessment: Exceptions to [facility] Passive Range of Motion (PROM) Comments: PROM WFLS for all joints Active Range of Motion (AROM) Comments: No active movement noted General Strength Comments: Pt currently Brunnstrum stage I movement in the right arm and hand with slight increased edema.  Max hand over hand assistance for any integration for functional use. LUE Assessment LUE Assessment: Within Functional Limits     Refer to Care Plan for Long Term Goals  Recommendations for other services: None    Discharge Criteria: Patient will be discharged  from OT if patient refuses treatment 3 consecutive times without medical reason, if treatment goals not met, if there is a change in medical status, if patient makes no progress towards goals or if patient is discharged from hospital.  The above assessment, treatment plan, treatment alternatives and goals were discussed and mutually agreed upon: by patient  Jasiah Buntin OTR/L 07/08/2019, 12:59 PM

## 2019-07-08 NOTE — Progress Notes (Signed)
[facility] PHYSICAL MEDICINE & REHABILITATION PROGRESS NOTE   Subjective/Complaints:  No issues per OT.  Pt oriented to person only , unaware of CVA No coughing or SOB, O2 sat ok off O2  ROS- unable to obtain due to cognition   Objective:   DG Chest 2 View  Result Date: 07/07/2019 CLINICAL DATA:  Evaluate pneumonia. EXAM: CHEST - 2 VIEW COMPARISON:  July 02, 2019 FINDINGS: The nasogastric tube seen on the prior study has been removed. Moderate severity patchy infiltrates are seen bilaterally. This is most prominent within the mid right lung, left lung base and bilateral apices and is mildly increased in severity when compared to the prior exam. There is no evidence of a pleural effusion or pneumothorax. The heart size and mediastinal contours are within normal limits. The visualized skeletal structures are unremarkable. IMPRESSION: 1. Moderate severity bilateral infiltrates, mildly increased in severity when compared to the prior study dated July 02, 2019. Electronically Signed   By: Virgina Norfolk M.D.   On: 07/07/2019 18:57   IR GASTROSTOMY TUBE MOD SED  Result Date: 07/06/2019 INDICATION: 74 year old male with dysphagia following cerebrovascular accident. EXAM: Fluoroscopically guided placement of percutaneous pull-through gastrostomy tube Interventional Radiologist:  Criselda Peaches, MD MEDICATIONS: 2 g Ancef, 1 mg glucagon; Antibiotics were administered within 1 hour of the procedure. ANESTHESIA/SEDATION: Versed 1.5 mg IV; Fentanyl 75 mcg IV Moderate Sedation Time:  8 minutes The patient was continuously monitored during the procedure by the interventional radiology nurse under my direct supervision. CONTRAST:  63mL OMNIPAQUE IOHEXOL 300 MG/ML  SOLN FLUOROSCOPY TIME:  Fluoroscopy Time: 4 minutes 54 seconds (17 mGy). COMPLICATIONS: None immediate. PROCEDURE: Informed written consent was obtained from the patient after a thorough discussion of the procedural risks, benefits and  alternatives. All questions were addressed. Maximal Sterile Barrier Technique was utilized including caps, mask, sterile gowns, sterile gloves, sterile drape, hand hygiene and skin antiseptic. A timeout was performed prior to the initiation of the procedure. Maximal barrier sterile technique utilized including caps, mask, sterile gowns, sterile gloves, large sterile drape, hand hygiene, and chlorhexadine skin prep. An angled catheter was advanced over a wire under fluoroscopic guidance through the nose, down the esophagus and into the body of the stomach. The stomach was then insufflated with several 100 ml of air. Fluoroscopy confirmed location of the gastric bubble, as well as inferior displacement of the barium stained colon. Under direct fluoroscopic guidance, a single T-tack was placed, and the anterior gastric wall drawn up against the anterior abdominal wall. Percutaneous access was then obtained into the mid gastric body with an 18 gauge sheath needle. Aspiration of air, and injection of contrast material under fluoroscopy confirmed needle placement. An Amplatz wire was advanced in the gastric body and the access needle exchanged for a 9-French vascular sheath. A snare device was advanced through the vascular sheath and an Amplatz wire advanced through the angled catheter. The Amplatz wire was successfully snared and this was pulled up through the esophagus and out the mouth. A 20-French Alinda Dooms MIC-PEG tube was then connected to the snare and pulled through the mouth, down the esophagus, into the stomach and out to the anterior abdominal wall. Hand injection of contrast material confirmed intragastric location. The T-tack retention suture was then cut. The pull through peg tube was then secured with the external bumper and capped. The patient will be observed for several hours with the newly placed tube on low wall suction to evaluate for any post procedure complication.  The patient tolerated the  procedure well, there is no immediate complication. IMPRESSION: Successful placement of a 20 French pull through gastrostomy tube. Electronically Signed   By: Jacqulynn Cadet M.D.   On: 07/06/2019 18:43   Recent Labs    07/07/19 0215 07/08/19 0520  WBC 11.9* 10.7*  HGB 14.4 14.0  HCT 45.1 42.1  PLT 281 261   Recent Labs    07/07/19 0215 07/08/19 0520  NA 140 142  K 3.5 3.6  CL 102 102  CO2 27 26  GLUCOSE 85 119*  BUN 12 12  CREATININE 1.10 1.05  CALCIUM 8.3* 8.7*   No intake or output data in the 24 hours ending 07/08/19 [id number]   Physical Exam: Vital Signs Blood pressure 109/64, pulse 63, temperature 97.9 F (36.6 C), temperature source Oral, resp. rate 19, weight 91.8 kg, SpO2 97 %.     Assessment/Plan: 1. Functional deficits secondary to Left PCA infarct  which require 3+ hours per day of interdisciplinary therapy in a comprehensive inpatient rehab setting.  Physiatrist is providing close team supervision and 24 hour management of active medical problems listed below.  Physiatrist and rehab team continue to assess barriers to discharge/monitor patient progress toward functional and medical goals  Care Tool:  Bathing              Bathing assist       Upper Body Dressing/Undressing Upper body dressing        Upper body assist      Lower Body Dressing/Undressing Lower body dressing            Lower body assist       Toileting Toileting    Toileting assist Assist for toileting: 2 Helpers     Transfers Chair/bed transfer  Transfers assist           Locomotion Ambulation   Ambulation assist              Walk 10 feet activity   Assist           Walk 50 feet activity   Assist           Walk 150 feet activity   Assist           Walk 10 feet on uneven surface  activity   Assist           Wheelchair     Assist               Wheelchair 50 feet with 2 turns activity    Assist             Wheelchair 150 feet activity     Assist          Blood pressure 109/64, pulse 63, temperature 97.9 F (36.6 C), temperature source Oral, resp. rate 19, weight 91.8 kg, SpO2 97 %.  Medical Problem List and Plan: 1.  Dense right hemiplegia with right inattention and poor postural reflexes, decreased safety awareness with delayed processing, difficulty sequencing, oral apraxia, dysarthria with language of confusion and cognitive deficits secondary to L PCA CVA and chronic R temporal and occipital lobe infarcts with small acute L frontal lobe infarct             -patient may shower             -ELOS/Goals: 27-32 days/mod/max A              CIR evals  2.  Antithrombotics: -DVT/anticoagulation:  Pharmaceutical: Heparin             -antiplatelet therapy: DAPT X 3 months followed by ASA alone.   3. Pain Management:  Tylenol prn.  4. Mood: LCSW to follow for evaluation and support when appropriate.              -antipsychotic agents: N/A 5. Neuropsych: This patient is not capable of making decisions on his own behalf. 6. Skin/Wound Care: Routine pressure relief measures. Needs air mattress as unable to position himself.  7. Fluids/Electrolytes/Nutrition: Monitor I/O.  CMP ordered for tomorrow a.m. 8. Aspiration PNA: Leucocytosis resolving. Completed 2 week course of Rocephin 1/22 and Zithromax X 5 days. Oral candida treated with diflucan X & days.Remains afebrile CXR lagging clinical picture  9. Severe post stroke dysphagia: PEG placed on 01/26--Tube feeds being slowly advanced every 4 hours to prevent refeeding syndrome.              Advance diet as tolerated 10 Malnutrition: At risk for refeeding syndrome--need to check  Mg             Phos and K daily X 2             Labs ordered for tomorrow 11. Acute on chronic CHF: Monitor for signs and symptoms of fluid overload.  Blood pressures remain soft--SBP 90's-110.               Daily weights 12. Inferior STEMI: On Lipitor,  Coreg BID 13. Urinary retention: Has not voided for since > 18 hours. Needs PVRs as well as caths for volumes > 350cc.              UA/urine culture ordered 14. Hypoxia: Continues to drop to 80's requires deep suctioning and 2 liters per [place].  Discontinue IVF.              Chest x-ray ordered as showing signs of fluid overload 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     LOS: 1 days A FACE TO FACE EVALUATION WAS PERFORMED  Charlett Blake 07/08/2019, 6:35 AM

## 2019-07-09 ENCOUNTER — Inpatient Hospital Stay (HOSPITAL_COMMUNITY): Payer: Medicare Other | Admitting: Speech Pathology

## 2019-07-09 ENCOUNTER — Inpatient Hospital Stay (HOSPITAL_COMMUNITY): Payer: Medicare Other | Admitting: Physical Therapy

## 2019-07-09 ENCOUNTER — Inpatient Hospital Stay (HOSPITAL_COMMUNITY): Payer: Medicare Other | Admitting: Occupational Therapy

## 2019-07-09 ENCOUNTER — Inpatient Hospital Stay (HOSPITAL_COMMUNITY): Payer: Medicare Other

## 2019-07-09 LAB — GLUCOSE, CAPILLARY
Glucose-Capillary: 121 mg/dL — ABNORMAL HIGH (ref 70–99)
Glucose-Capillary: 122 mg/dL — ABNORMAL HIGH (ref 70–99)
Glucose-Capillary: 122 mg/dL — ABNORMAL HIGH (ref 70–99)
Glucose-Capillary: 123 mg/dL — ABNORMAL HIGH (ref 70–99)
Glucose-Capillary: 123 mg/dL — ABNORMAL HIGH (ref 70–99)
Glucose-Capillary: 124 mg/dL — ABNORMAL HIGH (ref 70–99)
Glucose-Capillary: 151 mg/dL — ABNORMAL HIGH (ref 70–99)

## 2019-07-09 LAB — C DIFFICILE QUICK SCREEN W PCR REFLEX
C Diff antigen: NEGATIVE
C Diff interpretation: NOT DETECTED
C Diff toxin: NEGATIVE

## 2019-07-09 LAB — MAGNESIUM: Magnesium: 2.1 mg/dL (ref 1.7–2.4)

## 2019-07-09 LAB — PHOSPHORUS: Phosphorus: 4.5 mg/dL (ref 2.5–4.6)

## 2019-07-09 MED ORDER — FREE WATER
150.0000 mL | Freq: Four times a day (QID) | Status: DC
  Administered 2019-07-09 – 2019-07-14 (×21): 150 mL

## 2019-07-09 MED ORDER — ENOXAPARIN SODIUM 40 MG/0.4ML ~~LOC~~ SOLN
40.0000 mg | SUBCUTANEOUS | Status: DC
  Administered 2019-07-09 – 2019-07-14 (×6): 40 mg via SUBCUTANEOUS
  Filled 2019-07-09 (×6): qty 0.4

## 2019-07-09 MED ORDER — CHLORHEXIDINE GLUCONATE CLOTH 2 % EX PADS
6.0000 | MEDICATED_PAD | Freq: Every day | CUTANEOUS | Status: DC
  Administered 2019-07-10 – 2019-07-14 (×4): 6 via TOPICAL

## 2019-07-09 MED ORDER — FUROSEMIDE 10 MG/ML IJ SOLN: 40.0000 mg | Freq: Once | INTRAMUSCULAR | Status: DC

## 2019-07-09 MED ORDER — OSMOLITE 1.5 CAL PO LIQD
1000.0000 mL | ORAL | Status: DC
  Administered 2019-07-09 – 2019-07-13 (×4): 1000 mL
  Filled 2019-07-09 (×8): qty 1000

## 2019-07-09 MED ORDER — FUROSEMIDE 10 MG/ML IJ SOLN
40.0000 mg | Freq: Once | INTRAMUSCULAR | Status: AC
  Administered 2019-07-09: 40 mg via INTRAVENOUS
  Filled 2019-07-09: qty 4

## 2019-07-09 NOTE — Progress Notes (Signed)
Physical Therapy Session Note  Patient Details  Name: Kalee Bursey MRN: [id number] Date of Birth: 01-14-1946  Today's Date: 07/09/2019 PT Individual Time: 0902-0945 PT Individual Time Calculation (min): 43 min   and  Today's Date: 07/09/2019 PT Missed Time: 32 Minutes Missed Time Reason: Patient fatigue  Short Term Goals: Week 1:  PT Short Term Goal 1 (Week 1): Pt will perform supine<>sit with max assist of 1 PT Short Term Goal 2 (Week 1): Pt will perform sit<>stand with +2 max assist PT Short Term Goal 3 (Week 1): Pt will perform bed<>chair transfers with +2 max assist PT Short Term Goal 4 (Week 1): Pt will tolerate sitting OOB in TIS for at least 1 hour  Skilled Therapeutic Interventions/Progress Updates:   Pt received supine in bed and agreeable to therapy session - pt very drowsy/lethargic today. RN present to disconnect tube feedings for therapy session and reports pt had frequent diarrhea yesterday and that they are also performing a rolling schedule. No PRAFO or hand splint in room - therapist notified RN and followed-up with Jeannene Patella, PA. In supine, therapist assisted with donning pants max/total assist for threading LEs and max/total assist for rolling R/L respectively while therapist completed pulling up pants total assist. Supine>sit, HOB partially elevated, via logroll technique to L EOB for pt to use L UE to assist trunk upright with +2 max assist for B LE management and trunk upright. Upon sitting EOB pt required max/total assist to prevent posterior LOB - with increased time compared to yesterday pt able to progress to being able to maintain static sitting with CGA for ~15seconds then progressing to needing min assist and then to mod/max assist due to gradual fatigue while pt participated in R attention tasks to identify simple objects (lotion, soap, deodorant) - pt able to scan R of midline today for brief seconds. L lateral scoot EOB towards HOB with max/total assist of 1 with minimal  excursion scoots to improve position in bed prior to returning to supine. Returned to supine via reverse logroll technique with +2 max assist for trunk descent and B LE management for therapeutic rest break while therapist performed R LE PROM into hip flexion/extension, hip abduction/adduction, knee flexion/extension, and ankle DF x10reps each - R LE remains flaccid at this time. Despite increasing fatigue pt agreeable to return to EOB again - performed via same technique as described above. Pt tolerated sitting EOB ~16minutes this time with max assist for trunk support due to posterior LOB and unable to progress to less than mod assist due to increasing fatigue. Returned to supine as described above. Pt therapeutically positioned in supine with R UE support on pillows for edema management and RN notified of pt's position - needs in reach and bed alarm on. Missed 32 minutes of skilled physical therapy due to pt fatigue.  Therapy Documentation Precautions:  Precautions Precautions: Fall, Other (comment) Precaution Comments: right hemiparesis, right inattention, right visual field deficit Restrictions Weight Bearing Restrictions: No  Pain: No indicators of pain during session.   Therapy/Group: Individual Therapy  Tawana Scale, PT, DPT 07/09/2019, 7:58 AM

## 2019-07-09 NOTE — Progress Notes (Addendum)
[place] PHYSICAL MEDICINE & REHABILITATION PROGRESS NOTE   Subjective/Complaints:  Diarrhea and perianl redness reported by nursing, pt oriented to person and [place]   ROS- unable to obtain due to cognition   Objective:   DG Chest 2 View  Result Date: 07/07/2019 CLINICAL DATA:  Evaluate pneumonia. EXAM: CHEST - 2 VIEW COMPARISON:  July 02, 2019 FINDINGS: The nasogastric tube seen on the prior study has been removed. Moderate severity patchy infiltrates are seen bilaterally. This is most prominent within the mid right lung, left lung base and bilateral apices and is mildly increased in severity when compared to the prior exam. There is no evidence of a pleural effusion or pneumothorax. The heart size and mediastinal contours are within normal limits. The visualized skeletal structures are unremarkable. IMPRESSION: 1. Moderate severity bilateral infiltrates, mildly increased in severity when compared to the prior study dated July 02, 2019. Electronically Signed   By: Virgina Norfolk M.D.   On: 07/07/2019 18:57   VAS [place] LOWER EXTREMITY VENOUS (DVT)  Result Date: 07/08/2019  Lower Venous Study Indications: Edema.  Risk Factors: CVA, CHF. Anticoagulation: Heparin. Limitations: Poor ultrasound/tissue interface. Comparison Study: No prior exam. Performing Technologist: Baldwin Crown ARDMS, RVT  Examination Guidelines: A complete evaluation includes B-mode imaging, spectral Doppler, color Doppler, and power Doppler as needed of all accessible portions of each vessel. Bilateral testing is considered an integral part of a complete examination. Limited examinations for reoccurring indications may be performed as noted.  +---------+---------------+---------+-----------+----------+------------------+ RIGHT    CompressibilityPhasicitySpontaneityPropertiesThrombus Aging     +---------+---------------+---------+-----------+----------+------------------+ CFV      Full           Yes       Yes                                     +---------+---------------+---------+-----------+----------+------------------+ SFJ      Full                                                            +---------+---------------+---------+-----------+----------+------------------+ FV Prox  Full                                                            +---------+---------------+---------+-----------+----------+------------------+ FV Mid   Full                                                            +---------+---------------+---------+-----------+----------+------------------+ FV DistalFull                                         seen with color  flow               +---------+---------------+---------+-----------+----------+------------------+ PFV      Full                                                            +---------+---------------+---------+-----------+----------+------------------+ POP      Full           Yes      Yes                                     +---------+---------------+---------+-----------+----------+------------------+ PTV      Full                                                            +---------+---------------+---------+-----------+----------+------------------+ PERO     Full                                                            +---------+---------------+---------+-----------+----------+------------------+   +---------+---------------+---------+-----------+----------+------------------+ LEFT     CompressibilityPhasicitySpontaneityPropertiesThrombus Aging     +---------+---------------+---------+-----------+----------+------------------+ CFV      Full           Yes      Yes                                     +---------+---------------+---------+-----------+----------+------------------+ SFJ      Full                                                             +---------+---------------+---------+-----------+----------+------------------+ FV Prox  Full                                                            +---------+---------------+---------+-----------+----------+------------------+ FV Mid   Full                                                            +---------+---------------+---------+-----------+----------+------------------+ FV DistalFull                                         seen with color  flow               +---------+---------------+---------+-----------+----------+------------------+ PFV      Full                                                            +---------+---------------+---------+-----------+----------+------------------+ POP      Full           Yes      Yes                                     +---------+---------------+---------+-----------+----------+------------------+ PTV      Full                                                            +---------+---------------+---------+-----------+----------+------------------+ PERO     Full                                                            +---------+---------------+---------+-----------+----------+------------------+     Summary: Right: There is no evidence of deep vein thrombosis in the lower extremity. No cystic structure found in the popliteal fossa. Left: There is no evidence of deep vein thrombosis in the lower extremity. No cystic structure found in the popliteal fossa.  *See table(s) above for measurements and observations.    Preliminary    Recent Labs    07/07/19 0215 07/08/19 0520  WBC 11.9* 10.7*  HGB 14.4 14.0  HCT 45.1 42.1  PLT 281 261   Recent Labs    07/07/19 0215 07/08/19 0520  NA 140 142  K 3.5 3.6  CL 102 102  CO2 27 26  GLUCOSE 85 119*  BUN 12 12  CREATININE 1.10 1.05  CALCIUM 8.3* 8.7*   No intake or output data  in the 24 hours ending 07/09/19 0839   Physical Exam: Vital Signs Blood pressure 118/79, pulse 73, temperature 97.6 F (36.4 C), temperature source Oral, resp. rate 20, weight 97 kg, SpO2 97 %.     Assessment/Plan: 1. Functional deficits secondary to Left PCA infarct  which require 3+ hours per day of interdisciplinary therapy in a comprehensive inpatient rehab setting.  Physiatrist is providing close team supervision and 24 hour management of active medical problems listed below.  Physiatrist and rehab team continue to assess barriers to discharge/monitor patient progress toward functional and medical goals  Care Tool:  Bathing    Body parts bathed by patient: Chest, Abdomen, Face   Body parts bathed by helper: Front perineal area, Buttocks, Right upper leg, Left lower leg, Right lower leg, Right arm, Left arm, Left upper leg     Bathing assist Assist Level: Total Assistance - Patient < 25%     Upper Body Dressing/Undressing Upper body dressing   What is the patient wearing?: Pull over shirt    Upper body assist Assist Level: Maximal  Assistance - Patient 25 - 49%    Lower Body Dressing/Undressing Lower body dressing      What is the patient wearing?: Pants, Incontinence brief     Lower body assist Assist for lower body dressing: 2 Helpers     Toileting Toileting    Toileting assist Assist for toileting: 2 Helpers     Transfers Chair/bed transfer  Transfers assist  Chair/bed transfer activity did not occur: Safety/medical concerns  Chair/bed transfer assist level: 2 Helpers     Locomotion Ambulation   Ambulation assist   Ambulation activity did not occur: Safety/medical concerns          Walk 10 feet activity   Assist  Walk 10 feet activity did not occur: Safety/medical concerns        Walk 50 feet activity   Assist Walk 50 feet with 2 turns activity did not occur: Safety/medical concerns         Walk 150 feet  activity   Assist Walk 150 feet activity did not occur: Safety/medical concerns         Walk 10 feet on uneven surface  activity   Assist Walk 10 feet on uneven surfaces activity did not occur: Safety/medical concerns         Wheelchair     Assist Will patient use wheelchair at discharge?: (TBD)             Wheelchair 50 feet with 2 turns activity    Assist            Wheelchair 150 feet activity     Assist          Blood pressure 118/79, pulse 73, temperature 97.6 F (36.4 C), temperature source Oral, resp. rate 20, weight 97 kg, SpO2 97 %.  Medical Problem List and Plan: 1.  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  Antithrombotics: -DVT/anticoagulation:  Pharmaceutical: Heparin doppler neg , no renal failure will switch to Lovenox to limit injections              -antiplatelet therapy: DAPT X 3 months followed by ASA alone.   3. Pain Management:  Tylenol prn.  4. Mood: LCSW to follow for evaluation and support when appropriate.              -antipsychotic agents: N/A 5. Neuropsych: This patient is not capable of making decisions on his own behalf. 6. Skin/Wound Care: Routine pressure relief measures. Needs air mattress as unable to position himself.  Freq stools with peri anal erythema , no open areas , moisture barrier , try to keep dry ,7. Fluids/Electrolytes/Nutrition: Monitor I/O.  CMP ordered for tomorrow a.m. 8. Aspiration PNA: Leucocytosis resolving. Completed 2 week course of Rocephin 1/22 and Zithromax X 5 days. Oral candida treated with  diflucan X & days.Remains afebrile CXR lagging clinical picture  9. Severe post stroke dysphagia: PEG placed on 01/26--Tube feeds being slowly advanced every 4 hours to prevent refeeding syndrome.  Advance diet as tolerated 10 Malnutrition: At risk for refeeding syndrome--need to check  Mg             Phos and K daily X 2             Labs ordered for tomorrow 11. Acute on chronic CHF: Monitor for signs and symptoms of fluid overload.  Blood pressures remain soft--SBP 90's-110.               Daily weights 12. Inferior STEMI: On Lipitor, Coreg BID 13. Urinary retention: Has not voided for since > 18 hours. Needs PVRs as well as caths for volumes > 350cc.              UA/urine culture ordered 14. Hypoxia: Continues to drop to 80's requires deep suctioning and 2 liters per [address].  Discontinue IVF.              Chest x-ray ordered as showing signs of fluid overload 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    17, Diarrhea post IV abx will check C diff, if C diff neg may use anti diarrheal  LOS: 2 days A FACE TO FACE EVALUATION WAS PERFORMED  Charlett Blake 07/09/2019, 8:39 AM

## 2019-07-09 NOTE — Progress Notes (Signed)
Speech Language Pathology Daily Session Note  Patient Details  Name: Levi Pruitt MRN: [id number] Date of Birth: December 20, 1945  Today's Date: 07/09/2019 SLP Individual Time: [id number] SLP Individual Time Calculation (min): 20 min  Short Term Goals: Week 1: SLP Short Term Goal 1 (Week 1): Pt will consume therapeutic trials of puree and honey thick boluses demonstrating appropriate bolus awareness, lingual manipulation, and AP transit with Max A cues. SLP Short Term Goal 2 (Week 1): Pt will sustain attention to functional and familiar tasks for 2 minute intervals with Max A multimodal cues. SLP Short Term Goal 3 (Week 1): Pt will follow 2-step directions with 50% accuracy provided Max A multimodal cues. SLP Short Term Goal 4 (Week 1): Pt will orient to time and plcae with Max A verbal/visual cues. SLP Short Term Goal 5 (Week 1): Pt will use strategies to achieve 75% intelligibilty at the phrase level with Max A cues.  Skilled Therapeutic Interventions:  Pt's daughter present and this Probation officer provided education on pt's current impairments in cognition, speech intelligibility and dysphagia. Education provided on high aspiration risk, NPO status and cognitive deficits that are preventing return to PO status. Daughter very receptive and Patent attorney.      Pain    Therapy/Group: Individual Therapy  Levi Pruitt 07/09/2019, 3:08 PM

## 2019-07-09 NOTE — Progress Notes (Signed)
Per report from night shift RN patient was having increased respirations and slight difficulty with breathing. Throughout AM patient continued to have abdominal breathing and an increase in respirations from 20s to 30. Algis Liming, PA was notified and patient was placed on continuous pulse oximetry and was encouraged to cough and deep breathe, however patient was unable to accommodate the request. O2 saturation dipped into the high 80s and low 90s, patient was placed on 2 L nasal cannula and raised to 93%. Patient was also repositioned and placed in semi fowlers to help with breathing.

## 2019-07-09 NOTE — Progress Notes (Signed)
Occupational Therapy Session Note  Patient Details  Name: Levi Pruitt MRN: [id number] Date of Birth: [birth year]  Today's Date: 07/09/2019 OT Individual Time: 1420-1538 OT Individual Time Calculation (min): 78 min    Short Term Goals: Week 1:  OT Short Term Goal 1 (Week 1): Pt will complete UB bathing in supported sitting with min assist and mod instructional cueing. OT Short Term Goal 2 (Week 1): Pt will complete LB bathing sit to stand with total +2 (pt 40%). OT Short Term Goal 3 (Week 1): Pt will locate 2 grooming or bathing items right of midline with no more than mod instructional cueing. OT Short Term Goal 4 (Week 1): Pt will complete squat pivot transfer to the drop arm commode with max assist. OT Short Term Goal 5 (Week 1): Pt will donn a pullover shirt with mod assist in supported sitting.  Skilled Therapeutic Interventions/Progress Updates:    Pt in bed to start session with his daughter [name] present.  He had squirmed down in the bed and needed repositioning as well as stopping his tube feed as he was less than 30 degrees.  He was unable to state his daughter's name who was in the room and stated "Rod Holler" even when given choices.  He agreed to participation in OT session so had him focus on B/D from bed level to sitting EOB.  He needed max assist for rolling to the right with max demonstrational cueing for hand positioning and turning his head.  Total assist was needed for rolling to the left.  He needed total assist for cleaning his buttocks with slight BM noted as well as for donning new brief.  Max assist for sidelying to sitting on the left side of the bed.  He was able to maintain sitting EOB with overall min assist statically and max assist dynamically when working on bathing.  He needed max demonstrational cueing for scanning to midline and just to the right of midline to grasp the washcloth.  Max assist for donning pullover shirt with initiation of hemi dressing techniques.  He  needed total assist for threading pants with Total +2 (pt 20%) for sit to stand in order to pull garments over his hips.  Oxygen sats at 93% on 2Ls throughout session.  PA came in room to check on pt and requested to remove O2 if sats are 88% or better on room air.  Removed his oxygen with sats at 89-90%.  Returned pt to bed at end of session with total +2 (pt 20%).  Call button in reach with pt's daughter still present.  Tube feeding hooked back up as well as pulse ox monitor.    Therapy Documentation Precautions:  Precautions Precautions: Fall, Other (comment) Precaution Comments: right hemiparesis, right inattention, right visual field deficit Restrictions Weight Bearing Restrictions: No   Vital Signs: Therapy Vitals Temp: (!) 97.5 F (36.4 C) Temp Source: Oral Pulse Rate: 67 Resp: (!) 22 BP: 126/80 Patient Position (if appropriate): Lying Oxygen Therapy SpO2: 95 % O2 Device: Room Air Pain: Pain Assessment Pain Scale: Faces Pain Score: 0-No pain ADL: See Care Tool Section for some details of mobility and selfcare tasks  Therapy/Group: Individual Therapy  Taevion Sikora OTR/L 07/09/2019, 4:09 PM

## 2019-07-09 NOTE — Progress Notes (Signed)
Patient sitting up in bed with OT for ADLs. Seems brighter but fatiuged. No dyspnea or increased WOB reported with activity. Will check CXR due to weight discrepancy and question fluid overload causing orthopnea. He tends to slump in bed and wiggle to the bottom. Educated nursing on working with patient/family on IS as well as upright posture in bed to help improve oxygenation instead of turning up oxygen. On RA currently 93-94% with HR in 70's.

## 2019-07-09 NOTE — Progress Notes (Signed)
RN called on call MD to follow up on lasix. No lasix ordered at this time. Foley catheter in place. Tube feeds restarted at a rate of 25ml/hr with 150cc water flushes q6 hours. Patient repostioned several times as he scoots himself down. Current respirations 24 with oxygen saturation at 98% at 3L via nasal cannula. Will monitor.

## 2019-07-09 NOTE — IPOC Note (Signed)
Overall Plan of Care [facility]) Patient Details Name: Levi Pruitt MRN: [id number] DOB: 01-Feb-1946  Admitting Diagnosis: Arterial ischemic stroke, PCA (posterior cerebral artery), left, acute [facility])  Hospital Problems: Principal Problem:   Arterial ischemic stroke, PCA (posterior cerebral artery), left, acute ([place])     Functional Problem List: Nursing Bladder, Bowel, Endurance, Medication Management, Motor, Nutrition, Pain, Safety, Skin Integrity  PT Balance, Perception, Behavior, Safety, Edema, Sensory, Endurance, Skin Integrity, Motor, Nutrition, Pain  OT Balance, Cognition, Edema, Endurance, Motor, Vision, Sensory, Safety, Perception  SLP Cognition, Linguistic, Nutrition, Safety  TR         Basic ADL's: OT Eating, Grooming, Bathing, Dressing, Toileting     Advanced  ADL's: OT       Transfers: PT Bed Mobility, Bed to Chair, Car, Manufacturing systems engineer, Metallurgist: PT Ambulation, Emergency planning/management officer, Stairs     Additional Impairments: OT Fuctional Use of Upper Extremity  SLP Swallowing, Communication, Social Cognition expression Attention, Memory, Awareness  TR      Anticipated Outcomes Item Anticipated Outcome  Self Feeding min assist  Swallowing  Mod A   Basic self-care  Mod assist  Toileting  mod assist   Bathroom Transfers mod assist  Bowel/Bladder  mod assist  Transfers  min assist  Locomotion  TBD if pt will be a functional ambultor - possible short distances with mod assist using LRAD  Communication  Mod A  Cognition  Mod A  Pain  <3  Safety/Judgment  supervision   Therapy Plan: PT Intensity: Minimum of 1-2 x/day ,45 to 90 minutes PT Frequency: 5 out of 7 days PT Duration Estimated Length of Stay: ~4 weeks OT Intensity: Minimum of 1-2 x/day, 45 to 90 minutes OT Frequency: 5 out of 7 days OT Duration/Estimated Length of Stay: 24-26 days SLP Intensity: Minumum of 1-2 x/day, 30 to 90 minutes SLP Frequency: 3 to 5 out of 7 days SLP  Duration/Estimated Length of Stay: 4 weeks   Due to the current state of emergency, patients may not be receiving their 3-hours of Medicare-mandated therapy.   Team Interventions: Nursing Interventions Patient/Family Education, Bladder Management, Bowel Management, Disease Management/Prevention, Cognitive Remediation/Compensation, Skin Care/Wound Management, Psychosocial Support, Medication Management, Discharge Planning, Pain Management, Dysphagia/Aspiration Precaution Training  PT interventions Ambulation/gait training, Community reintegration, DME/adaptive equipment instruction, Neuromuscular re-education, Psychosocial support, Stair training, UE/LE Strength taining/ROM, Wheelchair propulsion/positioning, Training and development officer, Discharge planning, Functional electrical stimulation, Pain management, Skin care/wound management, Therapeutic Activities, UE/LE Coordination activities, Cognitive remediation/compensation, Disease management/prevention, Functional mobility training, Patient/family education, Therapeutic Exercise, Splinting/orthotics, Visual/perceptual remediation/compensation  OT Interventions Training and development officer, DME/adaptive equipment instruction, Patient/family education, Therapeutic Activities, Self Care/advanced ADL retraining, Functional electrical stimulation, Discharge planning, Cognitive remediation/compensation, Disease mangement/prevention, Functional mobility training, Neuromuscular re-education, UE/LE Strength taining/ROM, Therapeutic Exercise, UE/LE Coordination activities  SLP Interventions Cognitive remediation/compensation, Cueing hierarchy, Dysphagia/aspiration precaution training, Environmental controls, Functional tasks, Patient/family education, Therapeutic Activities, Speech/Language facilitation, Internal/external aids  TR Interventions    SW/CM Interventions Discharge Planning, Disease Management/Prevention, Psychosocial Support, Patient/Family Education    Barriers to Discharge MD  Medical stability  Nursing      PT Inaccessible home environment, Home environment access/layout, Decreased caregiver support, Lack of/limited family support, Neurogenic Bowel & Bladder, Nutrition means, Incontinence, Medical stability    OT Decreased caregiver support Pt currently does not have 24 hr supervision  SLP Nutrition means    SW Decreased caregiver support, Inaccessible home environment, Lack of/limited family support Lived alone prior to admission   Team Discharge Planning: Destination:  PT-Home ,OT- Home , SLP-Home Projected Follow-up: PT-Home health PT, 24 hour supervision/assistance, OT-  Home health OT, 24 hour supervision/assistance, SLP-24 hour supervision/assistance, Home Health SLP Projected Equipment Needs: PT-To be determined, OT- To be determined, SLP-None recommended by SLP Equipment Details: PT- , OT-  Patient/family involved in discharge planning: PT- Patient,  OT-Patient, SLP-Patient, Family member/caregiver  MD ELOS: 21-25d  Medical Rehab Prognosis:  Fair Assessment:  74 year old male in relatively good health (no medical care for 40+ years) who was admitted on 06/18/2019 after he did not show up for work" work-related EMS.  History taken chart review due to cognition.  He was found to have right facial droop, right hemiparesis, and dysarthria.  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  MRI brain revealed acute large left PCA infarct, small acute left frontal lobe infarct and chronic right temporal and right occipital lobe infarct.  Echocardiogram showed ejection fraction of 25-30% with severe decrease in  LVEF, grade 3 diastolic dysfunction, akinesis of inferior, inferior lateral, apical and distal septal wall and severe LV dysfunction. He was started on IV Rocephin/Zithromax due to concerns of aspiration PNA on on 06/19/2019.  Cardiology consult for input on abnormal EKG and patient felt to have inferior wall STEMI. Patient not a candidate for intervention and medical management recommended with low-dose Coreg and ACE---Lisinopril DC'd due to soft blood pressures.  He was treated with IV heparin through 1/10 and has been transitioned to ASA/Plavix.  Repeat 2D echo of 1/15 showed improvement in EF 30 to 35% and Dr. Erlinda Hong recommends DAPT X 3 months followed by ASA alone.   He was kept n.p.o. due to lethargy and cortak placed by radiology on 06/22/2019.  He has had issues with fluid overload requiring intermittent diuresis.  Patient continued to have issues with lethargy with difficulty handling secretions and palliative care consulted to discuss [place] as well as for input on PEG placement for nutritional support.  Family elected on full scope of care.  Follow-up chest x-ray 1/15 showed worsening of airspace opacities RUL and IV antibiotics added due to concerns of aspiration pneumonia.  As mentation improved MBS done on 1/18 and he was started on dysphagia 1 honey liquids.  He did have recurrent episode of hypoxia on 1/23 likely due to aspiration event and treated with Lasix for diuresis and continues to have ongoing supplemental oxygenation needs.[name]  He continued to have poor p.o. intake therefore PEG was placed on 07/06/2019 by Dr. Laurence Ferrari.  He has been incontinent of bladder and today found to have urinary retention with no urine output for > 18  Hrs--nurse unable to catheterize patient due to BPH.  Therapy ongoing and patient continues to be limited by dense right hemiplegia with right inattention and poor postural reflexes, decreased safety awareness with delayed processing, difficulty sequencing, oral apraxia,  dysarthria with language of confusion and cognitive deficits   Now requiring 24/7 Rehab RN,MD, as well as CIR level PT, OT and SLP.  Treatment team will focus on ADLs and mobility with goals set at Mod A See Team Conference Notes for weekly updates to the plan of care

## 2019-07-09 NOTE — Progress Notes (Signed)
RT called by RN to assess pts need for increased O2 consumption. Pt moaning and slouched down in the bed. RT and RN set pt up in the bed and RT increased O2 to 3 Lpm [place]. Pt sats currently 92%. MD notified of pts need for increased O2 and mildly wet breath sounds. MD is ordering another round of lasix and a foley catheter for placement to monitor pts urinary output. MD Kirstein's note from early today states pt had urinary retention, and had not voided for 18 hours, also states pt has acute on chronic CHF. RT will continue to monitor.

## 2019-07-09 NOTE — Progress Notes (Signed)
Patient more fatigued today and slurring more--family reports "not taking to me as usual" and also had concerns/questions about getting sleeping pills. On review note that patient got trazodone 25 mg night before and 50 mg last night. No reports of sleep wake disruption per records or nursing this morning. Will d/c and start sleep chart. Also spoke with PT about patient's routine of starting the day around 10 am and reports of having more energy in the evenings--they will try to start therapy later in the day.

## 2019-07-09 NOTE — Progress Notes (Signed)
Speech Language Pathology Daily Session Note  Patient Details  Name: Levi Pruitt MRN: [id number] Date of Birth: [birth year]  Today's Date: 07/09/2019 SLP Individual Time: [id number] SLP Individual Time Calculation (min): 45 min  Short Term Goals: Week 1: SLP Short Term Goal 1 (Week 1): Pt will consume therapeutic trials of puree and honey thick boluses demonstrating appropriate bolus awareness, lingual manipulation, and AP transit with Max A cues. SLP Short Term Goal 2 (Week 1): Pt will sustain attention to functional and familiar tasks for 2 minute intervals with Max A multimodal cues. SLP Short Term Goal 3 (Week 1): Pt will follow 2-step directions with 50% accuracy provided Max A multimodal cues. SLP Short Term Goal 4 (Week 1): Pt will orient to time and plcae with Max A verbal/visual cues. SLP Short Term Goal 5 (Week 1): Pt will use strategies to achieve 75% intelligibilty at the phrase level with Max A cues.  Skilled Therapeutic Interventions:  Skilled treatment session targeted dysphagia and cognitive linguistic goals.  SLP facilitated session by providing Total Assist for oral care via suction toothbrush d/t pt's deficits in problem solving and sustained attention to toothbrush. SLP provided skilled observation of pt consuming honey thick liquids via spoon. Pt with mildly increased oral phase prior to initiation of swallow. After ~ 1 oz, pt required frequent cues to maintain attention to oral bolus as he began holding bolus in his mouth for longer. After an additional ounce, pt required Max verbal cues to swallow d/t inability to sustain attention. PO trails stopped.  Pt pleasantly declined trials of puree (declined both applesauce and pudding). No overt s/s of aspiration observed.   Cognitive linguistic targets were sustained attention and increased speech intelligibility. Overall pt with ~ 15 minutes of alertness and sustained attention before rest break. Pt's speech was < 25%  intelligible at the word/phrase level with no awareness. With Moderate cues to speak louder and SLP questions that only targeted 1 to 2 word responses, pt's intelligibility improved to 50%. Pt disoriented to location but able to repeat [facility] x 5 with SLP and then demonstrated immediate recall without model. Pt unable to produce information after 5 minutes delay but was responsive to phonemic cues to produce answer. PT very restless at end of session and despite Maximal cues (verbal and physical) pt continued to slide towards end of bed. Total A +2 was required to reposition pt into safe position for PEG feedings.   Given continued deficits in sustained attention and oral holding, recommend pt remain NPO with further trials during skilled ST sessions to promote increased attention and safe swallowing.   Pt left semi-reclined in bed with all needs within reach. Continue per current plan of care.      Pain    Therapy/Group: Individual Therapy  Kendel Pesnell 07/09/2019, 7:53 AM

## 2019-07-09 NOTE — Progress Notes (Signed)
At approximately 1830, patient started to grunt and saturate down to 83% O2. This RN repositioned and suctioned the patient but O2 sats continued to remain low. This RN then placed patient on 1 L of O2 to bring sats back up to 90%. Respirations were fast at 32 so respiratory was called. Respiratory evaluated and placed a call to on call doctor and received orders for another dose of lasix and a foley to measure output. Tube feeds were paused due to increase in fluid retention.

## 2019-07-09 NOTE — Plan of Care (Signed)
  Problem: Consults Goal: RH STROKE PATIENT EDUCATION Description: See Patient Education module for education specifics  Outcome: Progressing   Problem: RH BOWEL ELIMINATION Goal: RH STG MANAGE BOWEL WITH ASSISTANCE Description: STG Manage Bowel with min Assistance. Outcome: Progressing Goal: RH STG MANAGE BOWEL W/MEDICATION W/ASSISTANCE Description: STG Manage Bowel with Medication with min Assistance. Outcome: Progressing   Problem: RH BLADDER ELIMINATION Goal: RH STG MANAGE BLADDER WITH ASSISTANCE Description: STG Manage Bladder With min/mod Assistance Outcome: Progressing   Problem: RH SKIN INTEGRITY Goal: RH STG SKIN FREE OF INFECTION/BREAKDOWN Description: Patients skin will remain free from further infection or breakdown with mod assist. Outcome: Progressing Goal: RH STG MAINTAIN SKIN INTEGRITY WITH ASSISTANCE Description: STG Maintain Skin Integrity With mod Assistance. Outcome: Progressing Goal: RH STG ABLE TO PERFORM INCISION/WOUND CARE W/ASSISTANCE Description: STG Able To Perform Incision/Wound Care With mod Assistance. Outcome: Progressing   Problem: RH SAFETY Goal: RH STG ADHERE TO SAFETY PRECAUTIONS W/ASSISTANCE/DEVICE Description: STG Adhere to Safety Precautions With min Assistance/Device. Outcome: Progressing   Problem: RH COGNITION-NURSING Goal: RH STG USES MEMORY AIDS/STRATEGIES W/ASSIST TO PROBLEM SOLVE Description: STG Uses Memory Aids/Strategies With min Assistance to Problem Solve. Outcome: Progressing   Problem: RH PAIN MANAGEMENT Goal: RH STG PAIN MANAGED AT OR BELOW PT'S PAIN GOAL Description: < 3 Outcome: Progressing   Problem: RH KNOWLEDGE DEFICIT Goal: RH STG INCREASE KNOWLEDGE OF HYPERTENSION Description: Patient and family will verbalize understanding of HTN management including monitoring, diet, exercise, medications, and follow up care with min assist. Outcome: Progressing Goal: RH STG INCREASE KNOWLEGDE OF HYPERLIPIDEMIA Description:  Patient and family will verbalize understanding of HLD management including monitoring, diet, exercise, medications, and follow up care with min assist. Outcome: Progressing Goal: RH STG INCREASE KNOWLEDGE OF STROKE PROPHYLAXIS Description: Patient and family will verbalize understanding of stroke management including monitoring, diet, exercise, medications, and follow up care with min assist. Outcome: Progressing   Problem: RH Vision Goal: RH LTG Vision (Specify) Outcome: Progressing

## 2019-07-10 ENCOUNTER — Inpatient Hospital Stay (HOSPITAL_COMMUNITY): Payer: Medicare Other

## 2019-07-10 ENCOUNTER — Inpatient Hospital Stay (HOSPITAL_COMMUNITY): Payer: Medicare Other | Admitting: Speech Pathology

## 2019-07-10 ENCOUNTER — Inpatient Hospital Stay (HOSPITAL_COMMUNITY): Payer: Medicare Other | Admitting: Occupational Therapy

## 2019-07-10 LAB — GLUCOSE, CAPILLARY
Glucose-Capillary: 114 mg/dL — ABNORMAL HIGH (ref 70–99)
Glucose-Capillary: 145 mg/dL — ABNORMAL HIGH (ref 70–99)
Glucose-Capillary: 126 mg/dL — ABNORMAL HIGH (ref 70–99)
Glucose-Capillary: 120 mg/dL — ABNORMAL HIGH (ref 70–99)
Glucose-Capillary: 144 mg/dL — ABNORMAL HIGH (ref 70–99)

## 2019-07-10 MED ORDER — MELATONIN 3 MG PO TABS
6.0000 mg | ORAL_TABLET | Freq: Every evening | ORAL | Status: DC | PRN
  Administered 2019-07-10: 6 mg via ORAL
  Filled 2019-07-10 (×2): qty 2

## 2019-07-10 MED ORDER — NON FORMULARY: 6.0000 mg | Freq: Every evening | Status: DC | PRN

## 2019-07-10 NOTE — Progress Notes (Signed)
Occupational Therapy Session Note  Patient Details  Name: Levi Pruitt MRN: [id number] Date of Birth: 13-Aug-1945  Today's Date: 07/10/2019 OT Individual Time: 1045-1130 OT Individual Time Calculation (min): 45 min  and Today's Date: 07/10/2019 OT Missed Time: 30 Minutes Missed Time Reason: Patient fatigue(severe lethargy)   Short Term Goals: Week 1:  OT Short Term Goal 1 (Week 1): Pt will complete UB bathing in supported sitting with min assist and mod instructional cueing. OT Short Term Goal 2 (Week 1): Pt will complete LB bathing sit to stand with total +2 (pt 40%). OT Short Term Goal 3 (Week 1): Pt will locate 2 grooming or bathing items right of midline with no more than mod instructional cueing. OT Short Term Goal 4 (Week 1): Pt will complete squat pivot transfer to the drop arm commode with max assist. OT Short Term Goal 5 (Week 1): Pt will donn a pullover shirt with mod assist in supported sitting.  Skilled Therapeutic Interventions/Progress Updates:    Pt received in bed sound asleep. RN reported pt had only slept 2 hours last night.  Entered room and pt did open his eyes and mumbled something.  When asked if I could do some stretching with him, he did nod his head yes.  Occasionally he would open his eyes but only for a few seconds.  He did hold a wet washcloth to wash his face partially.  No +2 A available this session.   PROM to RUE and RLE with faster movements added in to try to stimulate patient.  His passive range is functional and tone is flaccid.    Repositioned pt with A from nursing for more support to [place].  Wrist cock up splint donned on patient.   His daughter arrived. Discussed his current status and progression of his CVA recovery.  She stated that he is a true night owl and would often not wake up until 3pm.  Will attempt to schedule therapy later in the day.   Pt resting in bed with all needs met. Bed alarm on.    Therapy Documentation Precautions:   Precautions Precautions: Fall, Other (comment) Precaution Comments: right hemiparesis, right inattention, right visual field deficit Restrictions Weight Bearing Restrictions: No   Pain: Pain Assessment Pain Scale: 0-10 Pain Score: 0-No pain Faces Pain Scale: No hurt ADL: ADL Eating: Dependent Where Assessed-Eating: Bed level Grooming: Maximal assistance Where Assessed-Grooming: Bed level Upper Body Bathing: Maximal assistance Where Assessed-Upper Body Bathing: Bed level Lower Body Bathing: Dependent Where Assessed-Lower Body Bathing: Bed level Upper Body Dressing: Dependent Where Assessed-Upper Body Dressing: Edge of bed Lower Body Dressing: Dependent Where Assessed-Lower Body Dressing: Bed level Toileting: Dependent Where Assessed-Toileting: Glass blower/designer: Dependent Armed forces technical officer Method: Other (comment)(bedpan)   Therapy/Group: Individual Therapy  [place] 07/10/2019, 8:31 AM

## 2019-07-10 NOTE — Progress Notes (Signed)
Patient's respirations remain between 24-29. Oxygen maintained at 3L via nasal cannula with continuous pulse oximeter, saturation in the 90's range. Repositioned every 2 hours with head of bed at 30 degrees. Respiratory therapist called to check on patient's breathing. Patient's lungs sounding a little wet compared to last night. Patient responding to questions asked. Denies pain. Patient has slept maximum of 2 hour this shift. Will monitor.

## 2019-07-10 NOTE — Progress Notes (Signed)
[facility] PHYSICAL MEDICINE & REHABILITATION PROGRESS NOTE   Subjective/Complaints: Reviewed results of CXR: 1. Moderate severity bilateral pulmonary infiltrates, stable in severity when compared to the prior study. 2. Small bilateral pleural effusions. Patient's SOB well controlled, lethargic today as was up all night. Wife at bedside prefers no sleep medications at night; was agreeable to melatonin.   ROS- unable to obtain due to cognition   Objective:   DG Chest 2 View  Result Date: 07/09/2019 CLINICAL DATA:  In-patient encounter for arterial ischemic stroke. EXAM: CHEST - 2 VIEW COMPARISON:  July 07, 2019. FINDINGS: Moderate severity bilateral infiltrates are seen. This is most prominent within the bilateral apices and bilateral lung bases and is predominant stable in severity when compared to the prior study. Small bilateral pleural effusions are noted. No pneumothorax is identified. The cardiac silhouette is moderately enlarged. Degenerative changes seen throughout the thoracic spine. IMPRESSION: 1. Moderate severity bilateral pulmonary infiltrates, stable in severity when compared to the prior study. 2. Small bilateral pleural effusions. Electronically Signed   By: Virgina Norfolk M.D.   On: 07/09/2019 17:54   Recent Labs    07/08/19 0520  WBC 10.7*  HGB 14.0  HCT 42.1  PLT 261   Recent Labs    07/08/19 0520  NA 142  K 3.6  CL 102  CO2 26  GLUCOSE 119*  BUN 12  CREATININE 1.05  CALCIUM 8.7*    Intake/Output Summary (Last 24 hours) at 07/10/2019 1737 Last data filed at 07/10/2019 1503 Gross per 24 hour  Intake --  Output 975 ml  Net -975 ml     Physical Exam: Vital Signs Blood pressure 122/63, pulse 61, temperature (!) 97.5 F (36.4 C), resp. rate 20, weight 94 kg, SpO2 99 %. Nursing note and vitals reviewed. Constitutional: He appears well-developed and well-nourished. No distress. Nasal cannula in place. Lethargic.  HENT:  Head: Normocephalic and  atraumatic.  Eyes: Conjunctivae are normal. Right eye exhibits no discharge. Left eye exhibits no discharge.  Left gaze preference  Neck: No tracheal deviation present. No thyromegaly present.  Cardiovascular: Normal rate.  Respiratory: Breathing comfortably. No stridor. No respiratory distress.  + [place].  GI: Soft. He exhibits no distension. There is no abdominal tenderness.  PEG site with dry dressing.   Musculoskeletal:        General: Edema present.     Comments: Mild edema RUE/RLE.   Neurological: He is alert.  Oriented to self only.  Dysarthria and language of confusion noted.  Motor: Limited due to participation, however dense right hemiparesis noted LUE/LLE:?  3+-4 -/5 proximal distal.  Repeating "Uh-huh".  Skin: Skin is warm and dry. He is not diaphoretic.  Bilateral abrasions to knees  Psychiatric:  Flat affect and appears disengaged.    Assessment/Plan: 1. Functional deficits secondary to Left PCA infarct  which require 3+ hours per day of interdisciplinary therapy in a comprehensive inpatient rehab setting.  Physiatrist is providing close team supervision and 24 hour management of active medical problems listed below.  Physiatrist and rehab team continue to assess barriers to discharge/monitor patient progress toward functional and medical goals  Care Tool:  Bathing    Body parts bathed by patient: Chest, Abdomen, Face   Body parts bathed by helper: Front perineal area, Buttocks, Right upper leg, Left lower leg, Right lower leg, Right arm, Left arm, Left upper leg     Bathing assist Assist Level: 2 Helpers     Upper Body Dressing/Undressing Upper body dressing  What is the patient wearing?: Pull over shirt    Upper body assist Assist Level: Maximal Assistance - Patient 25 - 49%    Lower Body Dressing/Undressing Lower body dressing      What is the patient wearing?: Incontinence brief     Lower body assist Assist for lower body dressing: Total  Assistance - Patient < 25%     Toileting Toileting    Toileting assist Assist for toileting: Total Assistance - Patient < 25%     Transfers Chair/bed transfer  Transfers assist  Chair/bed transfer activity did not occur: Safety/medical concerns  Chair/bed transfer assist level: 2 Helpers     Locomotion Ambulation   Ambulation assist   Ambulation activity did not occur: Safety/medical concerns          Walk 10 feet activity   Assist  Walk 10 feet activity did not occur: Safety/medical concerns        Walk 50 feet activity   Assist Walk 50 feet with 2 turns activity did not occur: Safety/medical concerns         Walk 150 feet activity   Assist Walk 150 feet activity did not occur: Safety/medical concerns         Walk 10 feet on uneven surface  activity   Assist Walk 10 feet on uneven surfaces activity did not occur: Safety/medical concerns         Wheelchair     Assist Will patient use wheelchair at discharge?: (TBD)             Wheelchair 50 feet with 2 turns activity    Assist            Wheelchair 150 feet activity     Assist          Blood pressure 122/63, pulse 61, temperature (!) 97.5 F (36.4 C), resp. rate 20, weight 94 kg, SpO2 99 %.  Medical Problem List and Plan: 1.  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  Antithrombotics: -DVT/anticoagulation:  Pharmaceutical: Heparin doppler neg , no renal failure will switch to Lovenox to limit injections              -antiplatelet therapy: DAPT X 3 months followed  by ASA alone.   3. Pain Management:  Tylenol prn.  4. Mood: LCSW to follow for evaluation and support when appropriate.              -antipsychotic agents: N/A 5. Neuropsych: This patient is not capable of making decisions on his own behalf. 6. Skin/Wound Care: Routine pressure relief measures. Needs air mattress as unable to position himself.  Freq stools with peri anal erythema , no open areas , moisture barrier , try to keep dry ,7. Fluids/Electrolytes/Nutrition: Monitor I/O.   8. Aspiration PNA: Leucocytosis resolving. Completed 2 week course of Rocephin 1/22 and Zithromax X 5 days. Oral candida treated with diflucan X & days.Remains afebrile CXR lagging clinical picture  9. Severe post  stroke dysphagia: PEG placed on 01/26--Tube feeds being slowly advanced every 4 hours to prevent refeeding syndrome.              Advance diet as tolerated 10 Malnutrition: At risk for refeeding syndrome--need to check  Mg             Phos and K daily X 2 11. Acute on chronic CHF: Monitor for signs and symptoms of fluid overload.  Blood pressures remain soft--SBP 90's-110.               Daily weights 12. Inferior STEMI: On Lipitor, Coreg BID 13. Urinary retention: Has not voided for since > 18 hours. Needs PVRs as well as caths for volumes > 350cc.              UA/urine culture ordered 14. Hypoxia: Continues to drop to 80's requires deep suctioning and 2 liters per [place].  Discontinue IVF.              Chest x-ray ordered as showing signs of fluid overload. CXR reviewed 1/30 and is stable. Spoke with RT last night and decreased water flushes to 150 and TF to 50 to minimize fluid overload. Foley inserted for strict I/Os and to guide further Lasix dosing.  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   17, Diarrhea post IV abx will check C diff, if C diff neg may use anti diarrheal 18. Insomnia: will start  melatonin 6mg  at night. Patient was up all last night and thus slept during day and missed therapies. Wife agreeable to melatonin to help regulate sleep cycle. She does not want other sleep agents.   LOS: 3 days A FACE TO FACE EVALUATION WAS PERFORMED  Izora Ribas 07/10/2019, 5:37 PM

## 2019-07-10 NOTE — Plan of Care (Signed)
  Problem: Consults Goal: RH STROKE PATIENT EDUCATION Description: See Patient Education module for education specifics  Outcome: Progressing   Problem: RH BOWEL ELIMINATION Goal: RH STG MANAGE BOWEL WITH ASSISTANCE Description: STG Manage Bowel with min Assistance. Outcome: Progressing Goal: RH STG MANAGE BOWEL W/MEDICATION W/ASSISTANCE Description: STG Manage Bowel with Medication with min Assistance. Outcome: Progressing   Problem: RH BLADDER ELIMINATION Goal: RH STG MANAGE BLADDER WITH ASSISTANCE Description: STG Manage Bladder With min/mod Assistance Outcome: Progressing   Problem: RH SKIN INTEGRITY Goal: RH STG SKIN FREE OF INFECTION/BREAKDOWN Description: Patients skin will remain free from further infection or breakdown with mod assist. Outcome: Progressing Goal: RH STG MAINTAIN SKIN INTEGRITY WITH ASSISTANCE Description: STG Maintain Skin Integrity With mod Assistance. Outcome: Progressing Goal: RH STG ABLE TO PERFORM INCISION/WOUND CARE W/ASSISTANCE Description: STG Able To Perform Incision/Wound Care With mod Assistance. Outcome: Progressing   Problem: RH SAFETY Goal: RH STG ADHERE TO SAFETY PRECAUTIONS W/ASSISTANCE/DEVICE Description: STG Adhere to Safety Precautions With min Assistance/Device. Outcome: Progressing   Problem: RH COGNITION-NURSING Goal: RH STG USES MEMORY AIDS/STRATEGIES W/ASSIST TO PROBLEM SOLVE Description: STG Uses Memory Aids/Strategies With min Assistance to Problem Solve. Outcome: Progressing   Problem: RH PAIN MANAGEMENT Goal: RH STG PAIN MANAGED AT OR BELOW PT'S PAIN GOAL Description: < 3 Outcome: Progressing   Problem: RH Vision Goal: RH LTG Vision (Specify) Outcome: Progressing   Problem: RH KNOWLEDGE DEFICIT Goal: RH STG INCREASE KNOWLEDGE OF HYPERTENSION Description: Patient and family will verbalize understanding of HTN management including monitoring, diet, exercise, medications, and follow up care with min assist. Outcome:  Progressing Goal: RH STG INCREASE KNOWLEGDE OF HYPERLIPIDEMIA Description: Patient and family will verbalize understanding of HLD management including monitoring, diet, exercise, medications, and follow up care with min assist. Outcome: Progressing Goal: RH STG INCREASE KNOWLEDGE OF STROKE PROPHYLAXIS Description: Patient and family will verbalize understanding of stroke management including monitoring, diet, exercise, medications, and follow up care with min assist. Outcome: Progressing

## 2019-07-10 NOTE — Progress Notes (Signed)
SLP Cancellation Note  Patient Details Name: Levi Pruitt MRN: [id number] DOB: 04/21/1946  Cancelled treatment:  Upon entry to room patient sleeping soundly. Patient briefly roused to verbal and tactile stimuli but shook his head no in response to therapy. Per RN patient slept a maximum of 2 hours last night. Patient missed 60 minutes of therapy due to fatigue. Time: [place] 07/10/2019, 8:12 AM

## 2019-07-10 NOTE — Progress Notes (Signed)
Physical Therapy Session Note  Patient Details  Name: Levi Pruitt MRN: [id number] Date of Birth: [birth year]  Today's Date: 07/10/2019 PT Individual Time: 1300-1332 PT Individual Time Calculation (min): 32 min  and Today's Date: 07/10/2019 PT Missed Time: 13 Minutes Missed Time Reason: Patient fatigue  Short Term Goals: Week 1:  PT Short Term Goal 1 (Week 1): Pt will perform supine<>sit with max assist of 1 PT Short Term Goal 2 (Week 1): Pt will perform sit<>stand with +2 max assist PT Short Term Goal 3 (Week 1): Pt will perform bed<>chair transfers with +2 max assist PT Short Term Goal 4 (Week 1): Pt will tolerate sitting OOB in TIS for at least 1 hour  Skilled Therapeutic Interventions/Progress Updates:   Received pt asleep in bed, daughter present at bedside, and pt extremely lethargic. Pt able to open eyes with verbal stimuli, however unable to to keep eyes open longer than 15 seconds. Pt agreeable to therapy and denied any pain. Session focused on PROM, R attention, arousal, and improved tolerance to activity. No +2 available this session and pt limited by fatigue. Daughter did report they are going to try giving pt Melatonin tonight to see if he will sleep through the night. Therapist performed PROM into bilateral hip flexion/extension, knee flexion/extenstion, hip abduction/adduction, and DF. Pt with decreased L knee flexion ROM. Attempted LE strengthening exercises however pt only able to perform active assisted SLR on L UE x8. Pt continued to fall asleep throughout session but was slightly more awake when therapist put cool washcloth on pt's forehead. Pt performed dynamic reaching with LUE towards therapist's hand and was able to cross midline towards the R x 2 trials before falling back asleep. Pt was able to sustain R attention to look at therapist x 4 trials for approximately 10 seconds before falling asleep. Pt scooted down in bed and therapist assisted with scooting back toward HOB total  assist +1. Therapist repositioned pt in bed and provided pillow for support of R UE and donned L PRAFO boot. Concluded session with pt supine in bed, needs within reach, bed alarm on. 13 minutes missed of skilled physical therapy.   Therapy Documentation Precautions:  Precautions Precautions: Fall, Other (comment) Precaution Comments: right hemiparesis, right inattention, right visual field deficit Restrictions Weight Bearing Restrictions: No   Therapy/Group: Individual Therapy Alfonse Alpers PT, DPT   07/10/2019, 7:36 AM

## 2019-07-11 ENCOUNTER — Inpatient Hospital Stay (HOSPITAL_COMMUNITY): Payer: Medicare Other

## 2019-07-11 ENCOUNTER — Inpatient Hospital Stay (HOSPITAL_COMMUNITY): Payer: Medicare Other | Admitting: Speech Pathology

## 2019-07-11 LAB — GLUCOSE, CAPILLARY
Glucose-Capillary: 101 mg/dL — ABNORMAL HIGH (ref 70–99)
Glucose-Capillary: 110 mg/dL — ABNORMAL HIGH (ref 70–99)
Glucose-Capillary: 127 mg/dL — ABNORMAL HIGH (ref 70–99)
Glucose-Capillary: 137 mg/dL — ABNORMAL HIGH (ref 70–99)
Glucose-Capillary: 144 mg/dL — ABNORMAL HIGH (ref 70–99)
Glucose-Capillary: 154 mg/dL — ABNORMAL HIGH (ref 70–99)

## 2019-07-11 MED ORDER — DIPHENOXYLATE-ATROPINE 2.5-0.025 MG/5ML PO LIQD
5.0000 mL | Freq: Four times a day (QID) | ORAL | Status: DC | PRN
  Administered 2019-07-11: 5 mL via ORAL
  Filled 2019-07-11: qty 5

## 2019-07-11 MED ORDER — FUROSEMIDE 10 MG/ML IJ SOLN
40.0000 mg | INTRAMUSCULAR | Status: AC
  Administered 2019-07-11: 40 mg via INTRAVENOUS
  Filled 2019-07-11: qty 4

## 2019-07-11 NOTE — Plan of Care (Signed)
  Problem: Consults Goal: RH STROKE PATIENT EDUCATION Description: See Patient Education module for education specifics  Outcome: Progressing   Problem: RH BOWEL ELIMINATION Goal: RH STG MANAGE BOWEL WITH ASSISTANCE Description: STG Manage Bowel with min Assistance. Outcome: Progressing Goal: RH STG MANAGE BOWEL W/MEDICATION W/ASSISTANCE Description: STG Manage Bowel with Medication with min Assistance. Outcome: Progressing   Problem: RH BLADDER ELIMINATION Goal: RH STG MANAGE BLADDER WITH ASSISTANCE Description: STG Manage Bladder With min/mod Assistance Outcome: Progressing   Problem: RH SKIN INTEGRITY Goal: RH STG SKIN FREE OF INFECTION/BREAKDOWN Description: Patients skin will remain free from further infection or breakdown with mod assist. Outcome: Progressing Goal: RH STG MAINTAIN SKIN INTEGRITY WITH ASSISTANCE Description: STG Maintain Skin Integrity With mod Assistance. Outcome: Progressing Goal: RH STG ABLE TO PERFORM INCISION/WOUND CARE W/ASSISTANCE Description: STG Able To Perform Incision/Wound Care With mod Assistance. Outcome: Progressing   Problem: RH SAFETY Goal: RH STG ADHERE TO SAFETY PRECAUTIONS W/ASSISTANCE/DEVICE Description: STG Adhere to Safety Precautions With min Assistance/Device. Outcome: Progressing   Problem: RH COGNITION-NURSING Goal: RH STG USES MEMORY AIDS/STRATEGIES W/ASSIST TO PROBLEM SOLVE Description: STG Uses Memory Aids/Strategies With min Assistance to Problem Solve. Outcome: Progressing   Problem: RH PAIN MANAGEMENT Goal: RH STG PAIN MANAGED AT OR BELOW PT'S PAIN GOAL Description: < 3 Outcome: Progressing   Problem: RH KNOWLEDGE DEFICIT Goal: RH STG INCREASE KNOWLEDGE OF HYPERTENSION Description: Patient and family will verbalize understanding of HTN management including monitoring, diet, exercise, medications, and follow up care with min assist. Outcome: Progressing Goal: RH STG INCREASE KNOWLEGDE OF HYPERLIPIDEMIA Description:  Patient and family will verbalize understanding of HLD management including monitoring, diet, exercise, medications, and follow up care with min assist. Outcome: Progressing Goal: RH STG INCREASE KNOWLEDGE OF STROKE PROPHYLAXIS Description: Patient and family will verbalize understanding of stroke management including monitoring, diet, exercise, medications, and follow up care with min assist. Outcome: Progressing   Problem: RH Vision Goal: RH LTG Vision (Specify) Outcome: Progressing

## 2019-07-11 NOTE — Progress Notes (Signed)
At 2145, assisting Jaclyn, LPN  in assessing patient. Patient SOB,  using accessory muscles to breathe. Paged RT to assist with assessing and administer neb. Treatment. Paged Dr. Adam Phenix at 2210, awaiting return call. At 2240, Paged Dr. Posey Pronto related to above info and CXR ordered per protocol by rapid response. Dr. Posey Pronto  ordered  Lasix IV and BMP in AM. Will continue to monitor. Patrici Ranks A

## 2019-07-11 NOTE — Progress Notes (Signed)
Speech Language Pathology Daily Session Note  Patient Details  Name: Levi Pruitt MRN: [id number] Date of Birth: 14-Apr-1946  Today's Date: 07/11/2019 SLP Individual Time: 1400-1440 SLP Individual Time Calculation (min): 40 min  Short Term Goals: Week 1: SLP Short Term Goal 1 (Week 1): Pt will consume therapeutic trials of puree and honey thick boluses demonstrating appropriate bolus awareness, lingual manipulation, and AP transit with Max A cues. SLP Short Term Goal 2 (Week 1): Pt will sustain attention to functional and familiar tasks for 2 minute intervals with Max A multimodal cues. SLP Short Term Goal 3 (Week 1): Pt will follow 2-step directions with 50% accuracy provided Max A multimodal cues. SLP Short Term Goal 4 (Week 1): Pt will orient to time and plcae with Max A verbal/visual cues. SLP Short Term Goal 5 (Week 1): Pt will use strategies to achieve 75% intelligibilty at the phrase level with Max A cues.  Skilled Therapeutic Interventions: Pt was seen for skilled ST targeting dysphagia and cognitive goals. Pt's daughter was also present during session and receptive to education regarding current NPO status, aspiration risk, goals to move toward PO diet given readiness factors. Also discussed current cognitive deficits impacting speech, swallowing, and ADL functioning. Pt with improved ability to participate in oral care with toothbrush via suction in comparison to evaluation day, however Mod A still required for thoroughness to remove some thick/sticky secretions. Pt accepted teaspoons of honey thick juice and puree (pudding) with prolonged oral transit noted. Increased oral holding noted with puree, verbal cues required for bolus awareness and swallow initiation. No immediate overt s/sx aspiration noted, however a one delayed coughing episode noted later (~5 min after intake), suspect due to decreased management of secretions. Saliva suctioned from pt's oral cavity X4 throughout session. SLP  further facilitated session with Max A verbal cues for sustained attention to a basic card sorting task. Total A required for awareness of errors when sorting by color (black or red), and several verbal errors noted when pt relaying color of card. Language of confusion present as well as mild perseverations on single words. Pt left laying in bed with daughter still present at bedside. Continue per current plan of care.       Pain Pain Assessment Pain Scale: Faces Faces Pain Scale: No hurt  Therapy/Group: Individual Therapy  Arbutus Leas 07/11/2019, 2:47 PM

## 2019-07-11 NOTE — Progress Notes (Signed)
[place] PHYSICAL MEDICINE & REHABILITATION PROGRESS NOTE   Subjective/Complaints: Lying comfortably, except for belly breathing.  RN reports that he is continuing to have loose stools with bottom breakdown. C diff negative.   ROS- unable to obtain due to cognition   Objective:   DG Chest 2 View  Result Date: 07/09/2019 CLINICAL DATA:  In-patient encounter for arterial ischemic stroke. EXAM: CHEST - 2 VIEW COMPARISON:  July 07, 2019. FINDINGS: Moderate severity bilateral infiltrates are seen. This is most prominent within the bilateral apices and bilateral lung bases and is predominant stable in severity when compared to the prior study. Small bilateral pleural effusions are noted. No pneumothorax is identified. The cardiac silhouette is moderately enlarged. Degenerative changes seen throughout the thoracic spine. IMPRESSION: 1. Moderate severity bilateral pulmonary infiltrates, stable in severity when compared to the prior study. 2. Small bilateral pleural effusions. Electronically Signed   By: Virgina Norfolk M.D.   On: 07/09/2019 17:54   No results for input(s): WBC, HGB, HCT, PLT in the last 72 hours. No results for input(s): NA, K, CL, CO2, GLUCOSE, BUN, CREATININE, CALCIUM in the last 72 hours.  Intake/Output Summary (Last 24 hours) at 07/11/2019 1559 Last data filed at 07/11/2019 0436 Gross per 24 hour  Intake --  Output 250 ml  Net -250 ml     Physical Exam: Vital Signs Blood pressure 129/70, pulse 64, temperature (!) 97.5 F (36.4 C), resp. rate (!) 30, weight 94 kg, SpO2 93 %. Nursing note and vitals reviewed. Constitutional: He appears well-developed and well-nourished. No distress. Nasal cannula in place. Lethargic.  HENT:  Head: Normocephalic and atraumatic.  Eyes: Conjunctivae are normal. Right eye exhibits no discharge. Left eye exhibits no discharge.  Left gaze preference  Neck: No tracheal deviation present. No thyromegaly present.  Cardiovascular: Normal  rate.  Respiratory: Belly breathing. No stridor. No respiratory distress.  [place] off  GI: Soft. He exhibits no distension. There is no abdominal tenderness.  PEG site with dry dressing.   Musculoskeletal:        General: Edema present.     Comments: Mild edema RUE/RLE.   Neurological: He is alert.  Oriented to self only.  Dysarthria and language of confusion noted.  Motor: Limited due to participation, however dense right hemiparesis noted LUE/LLE:?  3+-4 -/5 proximal distal.  Repeating "Uh-huh".  Skin: Skin is warm and dry. He is not diaphoretic.  Bilateral abrasions to knees  Psychiatric:  Flat affect and appears disengaged.    Assessment/Plan: 1. Functional deficits secondary to Left PCA infarct  which require 3+ hours per day of interdisciplinary therapy in a comprehensive inpatient rehab setting.  Physiatrist is providing close team supervision and 24 hour management of active medical problems listed below.  Physiatrist and rehab team continue to assess barriers to discharge/monitor patient progress toward functional and medical goals  Care Tool:  Bathing    Body parts bathed by patient: Chest, Abdomen, Face   Body parts bathed by helper: Front perineal area, Buttocks, Right upper leg, Left lower leg, Right lower leg, Right arm, Left arm, Left upper leg     Bathing assist Assist Level: 2 Helpers     Upper Body Dressing/Undressing Upper body dressing   What is the patient wearing?: Pull over shirt    Upper body assist Assist Level: Maximal Assistance - Patient 25 - 49%    Lower Body Dressing/Undressing Lower body dressing      What is the patient wearing?: Incontinence brief  Lower body assist Assist for lower body dressing: Total Assistance - Patient < 25%     Toileting Toileting    Toileting assist Assist for toileting: Total Assistance - Patient < 25%     Transfers Chair/bed transfer  Transfers assist  Chair/bed transfer activity did not occur:  Safety/medical concerns  Chair/bed transfer assist level: 2 Helpers     Locomotion Ambulation   Ambulation assist   Ambulation activity did not occur: Safety/medical concerns          Walk 10 feet activity   Assist  Walk 10 feet activity did not occur: Safety/medical concerns        Walk 50 feet activity   Assist Walk 50 feet with 2 turns activity did not occur: Safety/medical concerns         Walk 150 feet activity   Assist Walk 150 feet activity did not occur: Safety/medical concerns         Walk 10 feet on uneven surface  activity   Assist Walk 10 feet on uneven surfaces activity did not occur: Safety/medical concerns         Wheelchair     Assist Will patient use wheelchair at discharge?: (TBD)             Wheelchair 50 feet with 2 turns activity    Assist            Wheelchair 150 feet activity     Assist          Blood pressure 129/70, pulse 64, temperature (!) 97.5 F (36.4 C), resp. rate (!) 30, weight 94 kg, SpO2 93 %.  Medical Problem List and Plan: 1.  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  Antithrombotics: -DVT/anticoagulation:  Pharmaceutical: Heparin doppler neg , no renal failure will switch to Lovenox to limit injections              -antiplatelet therapy: DAPT X 3 months followed by ASA alone.   3. Pain Management:  Tylenol prn.  4. Mood: LCSW to follow for evaluation and support when appropriate.              -antipsychotic agents: N/A 5. Neuropsych: This patient is not capable of making decisions on  his own behalf. 6. Skin/Wound Care: Routine pressure relief measures. Needs air mattress as unable to position himself.  Freq stools with peri anal erythema , no open areas , moisture barrier , try to keep dry ,7. Fluids/Electrolytes/Nutrition: Monitor I/O.   8. Aspiration PNA: Leucocytosis resolving. Completed 2 week course of Rocephin 1/22 and Zithromax X 5 days. Oral candida treated with diflucan X & days.Remains afebrile CXR lagging clinical picture  9. Severe post stroke dysphagia: PEG placed on 01/26--Tube feeds being slowly advanced every 4 hours to prevent refeeding syndrome.              Advance diet as tolerated 10 Malnutrition: At risk for refeeding syndrome--need to check  Mg  Phos and K daily X 2 11. Acute on chronic CHF: Monitor for signs and symptoms of fluid overload.  Blood pressures remain soft--SBP 90's-110.               Daily weights 12. Inferior STEMI: On Lipitor, Coreg BID 13. Urinary retention: Has not voided for since > 18 hours. Needs PVRs as well as caths for volumes > 350cc.              UA/urine culture ordered 14. Hypoxia: Continues to drop to 80's requires deep suctioning and 2 liters per [place].  Discontinue IVF.              Chest x-ray ordered as showing signs of fluid overload. CXR reviewed 1/30 and is stable. Spoke with RT last night and decreased water flushes to 150 and TF to 50 to minimize fluid overload. Foley inserted for strict I/Os and to guide further Lasix dosing.  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   17, Diarrhea post IV abx will check C diff, if C diff neg may use anti diarrheal  1/31: C diff negative, still with loose stools and buttock breakdown. Will order Lomotil PRN.  18. Insomnia: will start melatonin 6mg  at night. Patient was up all last night and thus slept during day and missed therapies. Wife agreeable to melatonin to  help regulate sleep cycle. She does not want other sleep agents.   LOS: 4 days A FACE TO FACE EVALUATION WAS PERFORMED  Clide Deutscher Kollin Udell 07/11/2019, 3:59 PM

## 2019-07-11 NOTE — Progress Notes (Signed)
MEWS Guidelines - (patients age 74 and over)  Red - At High Risk for Deterioration Yellow - At risk for Deterioration  1. Go to room and assess patient 2. Validate data. Is this patient's baseline? If data confirmed: 3. Is this an acute change? 4. Administer prn meds/treatments as ordered. 5. Note Sepsis score 6. Review goals of care 7. Sports coach, RRT nurse and Provider. 8. Ask Provider to come to bedside.  9. Document patient condition/interventions/response. 10. Increase frequency of vital signs and focused assessments to at least q15 minutes x 4, then q30 minutes x2. - If stable, then q1h x3, then q4h x3 and then q8h or dept. routine. - If unstable, contact Provider & RRT nurse. Prepare for possible transfer. 11. Add entry in progress notes using the smart phrase ".MEWS". 1. Go to room and assess patient 2. Validate data. Is this patient's baseline? If data confirmed: 3. Is this an acute change? 4. Administer prn meds/treatments as ordered? 5. Note Sepsis score 6. Review goals of care 7. Sports coach and Provider 8. Call RRT nurse as needed. 9. Document patient condition/interventions/response. 10. Increase frequency of vital signs and focused assessments to at least q2h x2. - If stable, then q4h x2 and then q8h or dept. routine. - If unstable, contact Provider & RRT nurse. Prepare for possible transfer. 11. Add entry in progress notes using the smart phrase ".MEWS".  Green - Likely stable [place] - Comfort Care Only  1. Continue routine/ordered monitoring.  2. Review goals of care. 1. Continue routine/ordered monitoring. 2. Review goals of care.   Mews score yellow due to pt RR(38). VS due for recheck Q2(11:45pm). Pt uncomfortable and continuously scooting to the lower end of bed. Pt repositioned with several pillows in an upright position for comfort. Pt O2 is 91. Pt continues to make grunting sound. Provider Posey Pronto, MD) notified of situation and Lasix  40mg  was ordered and pushed IV.

## 2019-07-11 NOTE — Significant Event (Signed)
Rapid Response Event Note  Overview: Increased WOB and tachypnea  Initial Focused Assessment: Notified by Earnest Bailey RT regarding pt with increased WOB and tachypnea. I directed the staff to contact the primary service because I was in a prior urgent situation with another patient. Upon arrival, Mr. Kyllo is alert, grunting with forced exhalation. BBS coarse rhonchi with minimal expiratory wheeze. RR 38-40 with accessory muscle use and congested cough. Minimal white secretions produced.  Sats 92-94% on 2L [place]. Skin warm, pink and dry. Nebulizer treatment given prior to my arrival. Primary svc notified and Lasix 40 mg ordered with labs. Will reassess after lasix.   Interventions: -Stat Jacobus of Care (if not transferred): -Keep HOB elevated as tolerated  -Encourage TCDB and IS when awake -Notify primary svc and/or RRRN for further assistance  Event Summary: Call received Arrived 2215 Call ended 2300  Madelynn Done

## 2019-07-11 NOTE — Progress Notes (Signed)
Patient slept a little better tonight with melatonin. Had a large bowel movement this morning. CHG bath and foley care done. Oxygen saturation at night between 88-90's on room air. Respirations  27, patient wheezing bilaterally. Respiratory called for breathing treatment. Patient comfortable in bed with 3-4 pillows propped on his back. Denies pain.

## 2019-07-12 ENCOUNTER — Inpatient Hospital Stay (HOSPITAL_COMMUNITY): Payer: Medicare Other | Admitting: Speech Pathology

## 2019-07-12 ENCOUNTER — Inpatient Hospital Stay (HOSPITAL_COMMUNITY): Payer: Medicare Other

## 2019-07-12 ENCOUNTER — Inpatient Hospital Stay (HOSPITAL_COMMUNITY): Payer: Medicare Other | Admitting: Occupational Therapy

## 2019-07-12 DIAGNOSIS — I5043 Acute on chronic combined systolic (congestive) and diastolic (congestive) heart failure: Secondary | ICD-10-CM

## 2019-07-12 DIAGNOSIS — I255 Ischemic cardiomyopathy: Secondary | ICD-10-CM

## 2019-07-12 DIAGNOSIS — I2510 Atherosclerotic heart disease of native coronary artery without angina pectoris: Secondary | ICD-10-CM

## 2019-07-12 LAB — CBC
HCT: 45.9 % (ref 39.0–52.0)
Hemoglobin: 14.6 g/dL (ref 13.0–17.0)
MCH: 30.7 pg (ref 26.0–34.0)
MCHC: 31.8 g/dL (ref 30.0–36.0)
MCV: 96.4 fL (ref 80.0–100.0)
Platelets: 277 10*3/uL (ref 150–400)
RBC: 4.76 MIL/uL (ref 4.22–5.81)
RDW: 13.7 % (ref 11.5–15.5)
WBC: 12.9 10*3/uL — ABNORMAL HIGH (ref 4.0–10.5)
nRBC: 0 % (ref 0.0–0.2)

## 2019-07-12 LAB — GLUCOSE, CAPILLARY
Glucose-Capillary: 118 mg/dL — ABNORMAL HIGH (ref 70–99)
Glucose-Capillary: 123 mg/dL — ABNORMAL HIGH (ref 70–99)
Glucose-Capillary: 123 mg/dL — ABNORMAL HIGH (ref 70–99)
Glucose-Capillary: 123 mg/dL — ABNORMAL HIGH (ref 70–99)
Glucose-Capillary: 126 mg/dL — ABNORMAL HIGH (ref 70–99)
Glucose-Capillary: 130 mg/dL — ABNORMAL HIGH (ref 70–99)
Glucose-Capillary: 83 mg/dL (ref 70–99)

## 2019-07-12 LAB — BASIC METABOLIC PANEL
Anion gap: 11 (ref 5–15)
BUN: 22 mg/dL (ref 8–23)
CO2: 29 mmol/L (ref 22–32)
Calcium: 8.6 mg/dL — ABNORMAL LOW (ref 8.9–10.3)
Chloride: 101 mmol/L (ref 98–111)
Creatinine, Ser: 1.05 mg/dL (ref 0.61–1.24)
GFR calc Af Amer: >60 mL/min (ref >60)
GFR calc non Af Amer: >60 mL/min (ref >60)
Glucose, Bld: 128 mg/dL — ABNORMAL HIGH (ref 70–99)
Potassium: 4.3 mmol/L (ref 3.5–5.1)
Sodium: 141 mmol/L (ref 135–145)

## 2019-07-12 MED ORDER — FUROSEMIDE 10 MG/ML IJ SOLN
20.0000 mg | Freq: Once | INTRAMUSCULAR | Status: AC
  Administered 2019-07-12: 14:00:00 20 mg via INTRAVENOUS
  Filled 2019-07-12: qty 2

## 2019-07-12 MED ORDER — FUROSEMIDE 40 MG PO TABS
40.0000 mg | ORAL_TABLET | Freq: Every day | ORAL | Status: DC
  Administered 2019-07-12 – 2019-07-14 (×3): 40 mg via ORAL
  Filled 2019-07-12 (×3): qty 1

## 2019-07-12 NOTE — Progress Notes (Signed)
[place] PHYSICAL MEDICINE & REHABILITATION PROGRESS NOTE   Subjective/Complaints:  No issues overnite   ROS- unable to obtain due to cognition   Objective:   DG Chest Port 1 View  Result Date: 07/11/2019 CLINICAL DATA:  Acute respiratory distress EXAM: PORTABLE CHEST 1 VIEW COMPARISON:  07/09/2019 FINDINGS: Cardiac shadow is stable. Increasing bilateral infiltrates are seen particularly in the right upper lobe. No sizable effusion is seen. No bony abnormality is noted. IMPRESSION: Increase in bilateral infiltrates particularly in the right upper lobe. Electronically Signed   By: Inez Catalina M.D.   On: 07/11/2019 22:37   Recent Labs    07/12/19 0558  WBC 12.9*  HGB 14.6  HCT 45.9  PLT 277   Recent Labs    07/12/19 0558  NA 141  K 4.3  CL 101  CO2 29  GLUCOSE 128*  BUN 22  CREATININE 1.05  CALCIUM 8.6*    Intake/Output Summary (Last 24 hours) at 07/12/2019 0940 Last data filed at 07/12/2019 0400 Gross per 24 hour  Intake 500 ml  Output 4200 ml  Net -3700 ml     Physical Exam: Vital Signs Blood pressure (!) 109/59, pulse 60, temperature 99.5 F (37.5 C), resp. rate 20, weight 93 kg, SpO2 91 %. Nursing note and vitals reviewed. Constitutional: He appears well-developed and well-nourished. No distress. Nasal cannula in place. Lethargic.  HENT:  Head: Normocephalic and atraumatic.  Eyes: Conjunctivae are normal. Right eye exhibits no discharge. Left eye exhibits no discharge.  Left gaze preference  Neck: No tracheal deviation present. No thyromegaly present.  Cardiovascular: Normal rate.  Respiratory: Belly breathing. No stridor. No respiratory distress.  [place] off  GI: Soft. He exhibits no distension. There is no abdominal tenderness.  PEG site with dry dressing.   Musculoskeletal:        General: Edema present.     Comments: Mild edema RUE/RLE.   Neurological: He is alert.  Oriented to self only.  Dysarthria and language of confusion noted.  Motor: Limited  due to participation, however dense right hemiparesis noted LUE/LLE:?  3+-4 -/5 proximal distal.  Repeating "Uh-huh".  Skin: Skin is warm and dry. He is not diaphoretic.  Bilateral abrasions to knees  Psychiatric:  Flat affect and appears disengaged.    Assessment/Plan: 1. Functional deficits secondary to Left PCA infarct  which require 3+ hours per day of interdisciplinary therapy in a comprehensive inpatient rehab setting.  Physiatrist is providing close team supervision and 24 hour management of active medical problems listed below.  Physiatrist and rehab team continue to assess barriers to discharge/monitor patient progress toward functional and medical goals  Care Tool:  Bathing    Body parts bathed by patient: Chest, Abdomen, Face   Body parts bathed by helper: Front perineal area, Buttocks, Right upper leg, Left lower leg, Right lower leg, Right arm, Left arm, Left upper leg     Bathing assist Assist Level: 2 Helpers     Upper Body Dressing/Undressing Upper body dressing   What is the patient wearing?: Pull over shirt    Upper body assist Assist Level: Maximal Assistance - Patient 25 - 49%    Lower Body Dressing/Undressing Lower body dressing      What is the patient wearing?: Incontinence brief     Lower body assist Assist for lower body dressing: Total Assistance - Patient < 25%     Toileting Toileting    Toileting assist Assist for toileting: Total Assistance - Patient < 25%  Transfers Chair/bed transfer  Transfers assist  Chair/bed transfer activity did not occur: Safety/medical concerns  Chair/bed transfer assist level: 2 Helpers     Locomotion Ambulation   Ambulation assist   Ambulation activity did not occur: Safety/medical concerns          Walk 10 feet activity   Assist  Walk 10 feet activity did not occur: Safety/medical concerns        Walk 50 feet activity   Assist Walk 50 feet with 2 turns activity did not occur:  Safety/medical concerns         Walk 150 feet activity   Assist Walk 150 feet activity did not occur: Safety/medical concerns         Walk 10 feet on uneven surface  activity   Assist Walk 10 feet on uneven surfaces activity did not occur: Safety/medical concerns         Wheelchair     Assist Will patient use wheelchair at discharge?: (TBD)             Wheelchair 50 feet with 2 turns activity    Assist            Wheelchair 150 feet activity     Assist          Blood pressure (!) 109/59, pulse 60, temperature 99.5 F (37.5 C), resp. rate 20, weight 93 kg, SpO2 91 %.  Medical Problem List and Plan: 1.  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  Antithrombotics: -DVT/anticoagulation:  Pharmaceutical: Heparin doppler neg , no renal failure will switch to Lovenox to limit injections              -antiplatelet therapy: DAPT X 3 months followed by ASA alone.   3. Pain Management:  Tylenol prn.  4. Mood: LCSW to follow for evaluation and support when appropriate.              -antipsychotic agents: N/A 5. Neuropsych: This patient is not capable of making decisions on his own behalf. 6. Skin/Wound Care: Routine pressure relief measures. Needs air mattress as unable to position himself.  Freq stools with peri anal erythema , no open areas , moisture barrier , try to keep dry ,7.  Fluids/Electrolytes/Nutrition: Monitor I/O.   8. Aspiration PNA: Leucocytosis resolving. Completed 2 week course of Rocephin 1/22 and Zithromax X 5 days. Oral candida treated with diflucan X & days.Remains afebrile CXR lagging clinical picture  9. Severe post stroke dysphagia: PEG placed on 01/26--Tube feeds being slowly advanced every 4 hours to prevent refeeding syndrome.              Advance diet as tolerated 10 Malnutrition: At risk for refeeding syndrome--need to check  Mg             Phos and K daily X 2 11. Acute on chronic CHF: Monitor for signs and  symptoms of fluid overload.  Blood pressures remain soft--SBP 90's-110.               Daily weights- Strict I/O has foley now x 3 d, will cont unless cardiology feels we no longer need it, pt is incont  12. Inferior STEMI: On Lipitor, Coreg BID 13. Urinary retention: Has not voided for since > 18 hours. Needs PVRs as well as caths for volumes > 350cc.              UA/urine culture ordered 14. Hypoxia: Continues to drop to 80's requires deep suctioning and 2 liters per [place].  Discontinue IVF.              Chest x-ray ordered as showing signs of fluid overload. CXR reviewed 1/30 and is stable. Spoke with RT last night and decreased water flushes to 150 and TF to 50 to minimize fluid overload. Foley inserted for strict I/Os and to guide further Lasix dosing.  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   17, Diarrhea post IV abx will check C diff, if C diff neg may use anti diarrheal  1/31: C diff negative, still with loose stools and buttock breakdown. Will order Lomotil PRN.  18. Insomnia: will start melatonin 6mg  at night. Patient was up all last night and thus slept during day and missed therapies. Wife agreeable to melatonin to help regulate sleep cycle. She does not want other sleep agents.   LOS: 5 days A FACE TO FACE EVALUATION WAS  PERFORMED  Charlett Blake 07/12/2019, 9:40 AM

## 2019-07-12 NOTE — Progress Notes (Signed)
Occupational Therapy Session Note  Patient Details  Name: Teren Nhan MRN: [id number] Date of Birth: [birth year]  Today's Date: 07/12/2019 OT Individual Time: [id number] OT Individual Time Calculation (min): 27 min    Short Term Goals: Week 1:  OT Short Term Goal 1 (Week 1): Pt will complete UB bathing in supported sitting with min assist and mod instructional cueing. OT Short Term Goal 2 (Week 1): Pt will complete LB bathing sit to stand with total +2 (pt 40%). OT Short Term Goal 3 (Week 1): Pt will locate 2 grooming or bathing items right of midline with no more than mod instructional cueing. OT Short Term Goal 4 (Week 1): Pt will complete squat pivot transfer to the drop arm commode with max assist. OT Short Term Goal 5 (Week 1): Pt will donn a pullover shirt with mod assist in supported sitting.  Skilled Therapeutic Interventions/Progress Updates:    Session [phone])  Pt seen in bed for OT session.  Difficult to arouse but eventually opened his eyes to verbal and tactile stimulation.  Noted greater amount of dysarthria when pt would attempt to answer therapist's questions.  He was given a washcloth and asked to wash his face with the RUE.  He completed only washing of his mouth.  Therapist stood on his right side and worked on having him scan to the right to look and answer simple questions as to the color of objects or therapist's shirt.  He needed max facilitation for head turns and then would close his eyes when therapist rotated his head.  He would open them to verbal command but then was severely dysarthric when attempting to answer the question asked by the therapist.  When MD entered, his speech improved some as well as his alertness.  Oxygen sats 91-92% throughout.  Finished session with trial application of resting hand splint on the right hand.  Pt able to tolerate at this time.  Pt left in bed with call button and phone in reach at end of session and nursing present.   Session 2  [phone])  Pt completed bathing and dressing sit to stand from the EOB during session.  He needed total assist for supine to sit with overall mod assist to maintain static sitting balance.  Pt continued throughout session to have moaning episodes in supine and when sitting, but was unable to state why or reason he was moaning.  Pt's daughter [name] in at the start of the session and remained throughout.  In sitting, pt demonstrates cervical flexion.  Max demonstrational cueing to scan to midline or slightly left for locating washcloth and wash pan.  Decreased consistency following one step instructional commands for bathing as when he was told to wash his face, he started washing his right hand.  Max step by step cueing needed to sequence with total hand over hand assistance needed using the RUE for washing the left.  He was able to assist with donning his shirt over the right arm and over his head.  He needed total assist for donning it over his right arm as well as pulling it down over his abdomen.  He needed total assist for donning pants and gripper socks with total +2 (pt 25%) for standing to pull them up over his hips.  He finished session with squat pivot transfer to the right with total assist +2 (pt 25%) to the tilt in space wheelchair.  Pt was scooted back in the wheelchair with max assist and left tilted back  with safety belt and alarm belt in place.  RUE supported on pillows.  NT made aware of pt being up and needing assist to get back in the bed in approximately one hour.  Soft touch call button in lap and pt's daughter in the room.    Therapy Documentation Precautions:  Precautions Precautions: Fall, Other (comment) Precaution Comments: right hemiparesis, right inattention, right visual field deficit Restrictions Weight Bearing Restrictions: No   Vital Signs: Therapy Vitals Pulse Rate: 60 Patient Position (if appropriate): Lying Oxygen Therapy SpO2: 91 % O2 Device: Nasal Cannula O2  Flow Rate (L/min): 2 L/min Patient Activity (if Appropriate): In bed Pain: Pain Assessment Pain Scale: Faces Pain Score: 0-No pain ADL: See Care Tool Section for some details of mobility and selfcare  Therapy/Group: Individual Therapy  Erica Richwine OTR/L 07/12/2019, 9:43 AM

## 2019-07-12 NOTE — Progress Notes (Signed)
Spoke with Dr. Posey Pronto, MD about CXR results. New order to increase o2 to 4L/m has been implemented.

## 2019-07-12 NOTE — Progress Notes (Signed)
Speech Language Pathology Daily Session Note  Patient Details  Name: Levi Pruitt MRN: [id number] Date of Birth: [birth year]  Today's Date: 07/12/2019 SLP Individual Time: 0730-0825 SLP Individual Time Calculation (min): 55 min  Short Term Goals: Week 1: SLP Short Term Goal 1 (Week 1): Pt will consume therapeutic trials of puree and honey thick boluses demonstrating appropriate bolus awareness, lingual manipulation, and AP transit with Max A cues. SLP Short Term Goal 2 (Week 1): Pt will sustain attention to functional and familiar tasks for 2 minute intervals with Max A multimodal cues. SLP Short Term Goal 3 (Week 1): Pt will follow 2-step directions with 50% accuracy provided Max A multimodal cues. SLP Short Term Goal 4 (Week 1): Pt will orient to time and plcae with Max A verbal/visual cues. SLP Short Term Goal 5 (Week 1): Pt will use strategies to achieve 75% intelligibilty at the phrase level with Max A cues.  Skilled Therapeutic Interventions: Pt was seen for skilled ST targeting cognitive-linguistic goals. Upon arrival pt's nasal cannula was over his head (suspect due to pt's tendency to scoot himself down in bed), and therefore not receiving supplemental O2 with SpO2 ranging from 85-88. SLP secured nasal cannula back in place, and SpO2 quickly returned to 93-96. Pt was lethargic and distractible throughout session, requiring Max A multimodal cues to open his eyes and sustain attention to functional and familiar tasks for 1-2 minute intervals. Pt produced many unintelligible responses, or those that reflected language of confusion, but when additional cues for attention and direct questions asked during a verbal description task, pt able to name objects, verbs, etc. Pt responsive to Max A verbal cues for increased vocal intensity and slow rate ("one word at a time") in order to increased intelligibility at the word and phrase level. Pt required Max A verbal and visual cues for problem solving and  sustained attention to assist with oral care via suction toothbrush. When asked to wash his face with a rag, pt stated, "well, maybe later." Max A multimodal cues provided for task initiation, however suspect mainly due to decreased attention and lethargy. Pt disoriented to place but quickly became aware of his error (stated he was at the grocery store) with verbal feedback from SLP and environmental cues. Max A provided for orientation to place and month, Total A for situation. Pt Total A required hand over hand assist to match colored blocks to a pattern board. Max A multimodal cues required for attention to right visual field today. Pt left laying in bed with alarm set and soft call bell at side. Continue per current plan of care.       Pain Pain Assessment Pain Scale: Faces Pain Score: 0-No pain Faces Pain Scale: No hurt  Therapy/Group: Individual Therapy  Arbutus Leas 07/12/2019, 9:48 AM

## 2019-07-12 NOTE — Plan of Care (Signed)
  Problem: Consults Goal: RH STROKE PATIENT EDUCATION Description: See Patient Education module for education specifics  Outcome: Progressing   Problem: RH BOWEL ELIMINATION Goal: RH STG MANAGE BOWEL WITH ASSISTANCE Description: STG Manage Bowel with min Assistance. Outcome: Progressing Goal: RH STG MANAGE BOWEL W/MEDICATION W/ASSISTANCE Description: STG Manage Bowel with Medication with min Assistance. Outcome: Progressing   Problem: RH BLADDER ELIMINATION Goal: RH STG MANAGE BLADDER WITH ASSISTANCE Description: STG Manage Bladder With min/mod Assistance Outcome: Progressing   Problem: RH SKIN INTEGRITY Goal: RH STG SKIN FREE OF INFECTION/BREAKDOWN Description: Patients skin will remain free from further infection or breakdown with mod assist. Outcome: Progressing Goal: RH STG MAINTAIN SKIN INTEGRITY WITH ASSISTANCE Description: STG Maintain Skin Integrity With mod Assistance. Outcome: Progressing Goal: RH STG ABLE TO PERFORM INCISION/WOUND CARE W/ASSISTANCE Description: STG Able To Perform Incision/Wound Care With mod Assistance. Outcome: Progressing   Problem: RH SAFETY Goal: RH STG ADHERE TO SAFETY PRECAUTIONS W/ASSISTANCE/DEVICE Description: STG Adhere to Safety Precautions With min Assistance/Device. Outcome: Progressing   Problem: RH COGNITION-NURSING Goal: RH STG USES MEMORY AIDS/STRATEGIES W/ASSIST TO PROBLEM SOLVE Description: STG Uses Memory Aids/Strategies With min Assistance to Problem Solve. Outcome: Progressing   Problem: RH PAIN MANAGEMENT Goal: RH STG PAIN MANAGED AT OR BELOW PT'S PAIN GOAL Description: < 3 Outcome: Progressing   Problem: RH KNOWLEDGE DEFICIT Goal: RH STG INCREASE KNOWLEDGE OF HYPERTENSION Description: Patient and family will verbalize understanding of HTN management including monitoring, diet, exercise, medications, and follow up care with min assist. Outcome: Progressing Goal: RH STG INCREASE KNOWLEGDE OF HYPERLIPIDEMIA Description:  Patient and family will verbalize understanding of HLD management including monitoring, diet, exercise, medications, and follow up care with min assist. Outcome: Progressing Goal: RH STG INCREASE KNOWLEDGE OF STROKE PROPHYLAXIS Description: Patient and family will verbalize understanding of stroke management including monitoring, diet, exercise, medications, and follow up care with min assist. Outcome: Progressing   Problem: RH Vision Goal: RH LTG Vision (Specify) Outcome: Progressing

## 2019-07-12 NOTE — Consult Note (Addendum)
Cardiology Consultation:   Patient ID: Levi Pruitt MRN: [id number]; DOB: [birth year]  Admit date: 07/07/2019 Date of Consult: 07/12/2019  Primary Care Provider: Patient, No Pcp Per Primary Cardiologist: No primary care provider on file.  Primary Electrophysiologist:  None   Patient Profile:   Levi Pruitt is a 74 y.o. male with a hx of CVA, Inferior STEMI (treated medically), chronic systolic HF and  who is being seen today for the evaluation of CHF at the request of Dr. Letta Pate.   History of Present Illness:   Levi Pruitt is a 74 yo male who initially presented on 06/18/19 with abnormal EKG and acute CVA. Found to have right sided facial droop. Right hemiparesis and dysarthria. EKG was concerning for acute inferior STEMI seen by Dr. Burt Knack in the ED. Decision was made to treat medically with IV heparin and not take to the cath lab given his acute CVA. MIR showed a large acute left PCA infarct with small acute left frontal lobe infarct. EF noted at 25-30% with G3DD, dysfunction, akinesis of inferior, inferior lateral, apical and distal septal wall. Cardiology followed and transitioned from IV heparin to ASA/plavix once cleared by neurology.  He was also treated for aspiration PNA. Did require a CorTrak for period of time but was able to transition to dysphagia diet. Continued to have poor intake and PEG tube was placed. Was treated intermittently with lasix during admission. Does not appear he was discharged to CIR with lasix on 07/07/19.   Notes indicate he had increased WOB and tachypnea last evening. RR RN was called and noted increased RR rate, along with coarse rhonchi and congested cough. Ordered and received IV lasix 40mg . CXR showed showed increase in bilateral infiltrates in the right upper lobe. Weights noted to have increased since admission to CIR.   Heart Pathway Score:     Past Medical History:  Diagnosis Date  . 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 IR GASTROSTOMY TUBE MOD SED  07/06/2019     Home Medications:  Prior to Admission medications   Medication Sig Start Date End Date Taking? Authorizing Provider  aspirin 81 MG chewable tablet Place 4 tablets (324 mg total) into feeding tube daily. 07/08/19   Dessa Phi, DO  atorvastatin (LIPITOR) 80 MG tablet Place 1 tablet (80 mg total) into feeding tube daily at 6 PM. 07/07/19   Dessa Phi, DO  carvedilol (COREG) 3.125 MG tablet Place 1 tablet (3.125 mg total) into feeding tube 2 (two) times daily with a meal. 07/07/19   Dessa Phi, DO  Cholecalciferol ([employer] VITAMIN D3) 250 MCG (10000 UT) CAPS Take 1 capsule by mouth daily.    [provider]  clopidogrel (PLAVIX) 75 MG tablet Place 1 tablet (75 mg total) into feeding tube daily. 07/07/19   Dessa Phi, DO  vitamin C (ASCORBIC ACID) 250 MG tablet Take 250 mg by mouth daily.    [provider]    Inpatient Medications: Scheduled Meds: . aspirin  324 mg Per Tube Daily  . atorvastatin  80 mg Per Tube q1800  . carvedilol  3.125 mg Per Tube BID WC  . chlorhexidine  15 mL Mouth Rinse BID  . Chlorhexidine Gluconate Cloth  6 each Topical Daily  . clopidogrel  75 mg Per Tube Daily  . enoxaparin (LOVENOX) injection  40 mg Subcutaneous Q24H  . free water  150 mL Per Tube Q6H  .  furosemide  40 mg Oral Daily  . Gerhardt's butt cream   Topical TID  . insulin aspart  0-9 Units Subcutaneous Q4H  . mouth rinse  15 mL Mouth Rinse q12n4p   Continuous Infusions: . feeding supplement (OSMOLITE 1.5 CAL) 50 mL/hr at 07/12/19 0400   PRN Meds: acetaminophen, alum & mag hydroxide-simeth, bisacodyl, diphenhydrAMINE, diphenoxylate-atropine, guaiFENesin-dextromethorphan, ipratropium-albuterol, lidocaine, Melatonin, polyethylene glycol, prochlorperazine **OR** prochlorperazine **OR** prochlorperazine, sodium phosphate  Allergies:   No Known Allergies  Social History:    Social History   Socioeconomic History  . Marital status: Widowed    Spouse name: Not on file  . Number of children: Not on file  . Years of education: Not on file  . Highest education level: Not on file  Occupational History  . Occupation: Lawer, semi-retired.  Tobacco Use  . Smoking status: Not on file  Substance and Sexual Activity  . Alcohol use: Not on file  . Drug use: Not on file  . Sexual activity: Not on file  Other Topics Concern  . Not on file  Social History Narrative   Staying home most of the time, does not exercise.    Social Determinants of Health   Financial Resource Strain:   . Difficulty of Paying Living Expenses: Not on file  Food Insecurity:   . Worried About Charity fundraiser in the Last Year: Not on file  . Ran Out of Food in the Last Year: Not on file  Transportation Needs:   . Lack of Transportation (Medical): Not on file  . Lack of Transportation (Non-Medical): Not on file  Physical Activity:   . Days of Exercise per Week: Not on file  . Minutes of Exercise per Session: Not on file  Stress:   . Feeling of Stress : Not on file  Social Connections:   . Frequency of Communication with Friends and Family: Not on file  . Frequency of Social Gatherings with Friends and Family: Not on file  . Attends Religious Services: Not on file  . Active Member of Clubs or Organizations: Not on file  . Attends Archivist Meetings: Not on file  . Marital Status: Not on file  Intimate Partner Violence:   . Fear of Current or Ex-Partner: Not on file  . Emotionally Abused: Not on file  . Physically Abused: Not on file  . Sexually Abused: Not on file    Family History:    Family History  Problem Relation Age of Onset  . Heart disease Father   . High blood pressure Sister        is his twin     ROS:  Please see the history of present illness.   All other ROS reviewed and negative.     Physical Exam/Data:   Vitals:   07/12/19 0202  07/12/19 0500 07/12/19 0800 07/12/19 0929  BP: (!) 108/57 (!) 109/59    Pulse: 71 76  60  Resp: (!) 25 20    Temp: 98.5 F (36.9 C) 99.5 F (37.5 C)    TempSrc:      SpO2: 94% 97% 93% 91%  Weight:  93 kg      Intake/Output Summary (Last 24 hours) at 07/12/2019 1041 Last data filed at 07/12/2019 0400 Gross per 24 hour  Intake 500 ml  Output 4200 ml  Net -3700 ml   Last 3 Weights 07/12/2019 07/11/2019 07/10/2019  Weight (lbs) 205 lb 0.4 oz 207 lb 3.7 oz 207 lb 3.7 oz  Weight (kg) 93 kg 94 kg 94 kg     Body mass index is 30.28 kg/m.  General:  Well nourished, well developed, in no acute distress. Wearing [place] @2L .  HEENT: normal Lymph: no adenopathy Neck: no JVD Vascular: No carotid bruits Cardiac:  normal S1, S2; RRR; no murmur  Lungs:  Expiratory wheezing, crackles in bases Abd: soft, nontender, no hepatomegaly  Ext: no edema Musculoskeletal:  No deformities right sided hemiparesis.  Skin: warm and dry  Neuro:  CNs 2-12 intact dysarthria noted but able to answer simple questions appropriately.  Psych:  Normal affect   EKG:  The EKG was personally reviewed and demonstrates: No recent EKG, 06/20/19 SR with acute ST elevation in inferior leads.  Relevant CV Studies:  TTE: 06/25/19  IMPRESSIONS    1. Left ventricular ejection fraction, by visual estimation, is 30 to  35%. The left ventricle has moderate to severely decreased function. There  is mildly increased left ventricular hypertrophy.  2. The left ventricle demonstrates regional wall motion abnormalities.  Inferior/inferoseptal/apical akinesis  3. Moderately dilated left ventricular internal cavity size.  4. Left ventricular diastolic parameters are consistent with Grade III  diastolic dysfunction (restrictive).  5. Elevated left atrial pressure.  6. Global right ventricle has normal systolic function.The right  ventricular size is normal.  7. The mitral valve is normal in structure. Trivial mitral valve   regurgitation.  8. The tricuspid valve is normal in structure.  9. The aortic valve is tricuspid. Aortic valve regurgitation is not  visualized. No evidence of aortic valve sclerosis or stenosis.  10. The pulmonic valve was not well visualized. Pulmonic valve  regurgitation is not visualized.  11. TR signal is inadequate for assessing pulmonary artery systolic  pressure.  12. The inferior vena cava is normal in size with greater than 50%  respiratory variability, suggesting right atrial pressure of 3 mmHg.   Laboratory Data:  High Sensitivity Troponin:   Recent Labs  Lab 06/18/19 1531 06/18/19 1828 06/18/19 2212 06/19/19 0000  TROPONINIHS 12,799* 12,437* 10,883* 8,791*     Chemistry Recent Labs  Lab 07/07/19 0215 07/08/19 0520 07/12/19 0558  NA 140 142 141  K 3.5 3.6 4.3  CL 102 102 101  CO2 27 26 29   GLUCOSE 85 119* 128*  BUN 12 12 22   CREATININE 1.10 1.05 1.05  CALCIUM 8.3* 8.7* 8.6*  GFRNONAA >60 >60 >60  GFRAA >60 >60 >60  ANIONGAP 11 14 11     Recent Labs  Lab 07/06/19 0747 07/07/19 0215 07/08/19 0520  PROT  --   --  5.5*  ALBUMIN 2.3* 2.2* 2.1*  AST  --   --  49*  ALT  --   --  45*  ALKPHOS  --   --  88  BILITOT  --   --  0.9   Hematology Recent Labs  Lab 07/07/19 0215 07/08/19 0520 07/12/19 0558  WBC 11.9* 10.7* 12.9*  RBC 4.67 4.49 4.76  HGB 14.4 14.0 14.6  HCT 45.1 42.1 45.9  MCV 96.6 93.8 96.4  MCH 30.8 31.2 30.7  MCHC 31.9 33.3 31.8  RDW 13.5 13.4 13.7  PLT 281 261 277   BNPNo results for input(s): BNP, PROBNP in the last 168 hours.  DDimer No results for input(s): DDIMER in the last 168 hours.   Radiology/Studies:  DG Chest 2 View  Result Date: 07/09/2019 CLINICAL DATA:  In-patient encounter for arterial ischemic stroke. EXAM: CHEST - 2 VIEW COMPARISON:  July 07, 2019. FINDINGS:  Moderate severity bilateral infiltrates are seen. This is most prominent within the bilateral apices and bilateral lung bases and is predominant  stable in severity when compared to the prior study. Small bilateral pleural effusions are noted. No pneumothorax is identified. The cardiac silhouette is moderately enlarged. Degenerative changes seen throughout the thoracic spine. IMPRESSION: 1. Moderate severity bilateral pulmonary infiltrates, stable in severity when compared to the prior study. 2. Small bilateral pleural effusions. Electronically Signed   By: Virgina Norfolk M.D.   On: 07/09/2019 17:54   DG Chest Port 1 View  Result Date: 07/11/2019 CLINICAL DATA:  Acute respiratory distress EXAM: PORTABLE CHEST 1 VIEW COMPARISON:  07/09/2019 FINDINGS: Cardiac shadow is stable. Increasing bilateral infiltrates are seen particularly in the right upper lobe. No sizable effusion is seen. No bony abnormality is noted. IMPRESSION: Increase in bilateral infiltrates particularly in the right upper lobe. Electronically Signed   By: Inez Catalina M.D.   On: 07/11/2019 22:37   VAS [place] LOWER EXTREMITY VENOUS (DVT)  Result Date: 07/10/2019  Lower Venous Study Indications: Edema.  Risk Factors: CVA, CHF. Anticoagulation: Heparin. Limitations: Poor ultrasound/tissue interface. Comparison Study: No prior exam. Performing Technologist: Baldwin Crown ARDMS, RVT  Examination Guidelines: A complete evaluation includes B-mode imaging, spectral Doppler, color Doppler, and power Doppler as needed of all accessible portions of each vessel. Bilateral testing is considered an integral part of a complete examination. Limited examinations for reoccurring indications may be performed as noted.  +---------+---------------+---------+-----------+----------+------------------+ RIGHT    CompressibilityPhasicitySpontaneityPropertiesThrombus Aging     +---------+---------------+---------+-----------+----------+------------------+ CFV      Full           Yes      Yes                                      +---------+---------------+---------+-----------+----------+------------------+ SFJ      Full                                                            +---------+---------------+---------+-----------+----------+------------------+ FV Prox  Full                                                            +---------+---------------+---------+-----------+----------+------------------+ FV Mid   Full                                                            +---------+---------------+---------+-----------+----------+------------------+ FV DistalFull                                         seen with color  flow               +---------+---------------+---------+-----------+----------+------------------+ PFV      Full                                                            +---------+---------------+---------+-----------+----------+------------------+ POP      Full           Yes      Yes                                     +---------+---------------+---------+-----------+----------+------------------+ PTV      Full                                                            +---------+---------------+---------+-----------+----------+------------------+ PERO     Full                                                            +---------+---------------+---------+-----------+----------+------------------+   +---------+---------------+---------+-----------+----------+------------------+ LEFT     CompressibilityPhasicitySpontaneityPropertiesThrombus Aging     +---------+---------------+---------+-----------+----------+------------------+ CFV      Full           Yes      Yes                                     +---------+---------------+---------+-----------+----------+------------------+ SFJ      Full                                                             +---------+---------------+---------+-----------+----------+------------------+ FV Prox  Full                                                            +---------+---------------+---------+-----------+----------+------------------+ FV Mid   Full                                                            +---------+---------------+---------+-----------+----------+------------------+ FV DistalFull                                         seen with color  flow               +---------+---------------+---------+-----------+----------+------------------+ PFV      Full                                                            +---------+---------------+---------+-----------+----------+------------------+ POP      Full           Yes      Yes                                     +---------+---------------+---------+-----------+----------+------------------+ PTV      Full                                                            +---------+---------------+---------+-----------+----------+------------------+ PERO     Full                                                            +---------+---------------+---------+-----------+----------+------------------+     Summary: Right: There is no evidence of deep vein thrombosis in the lower extremity. No cystic structure found in the popliteal fossa. Left: There is no evidence of deep vein thrombosis in the lower extremity. No cystic structure found in the popliteal fossa.  *See table(s) above for measurements and observations. Electronically signed by Servando Snare MD on 07/10/2019 at 8:44:09 AM.    Final    Assessment and Plan:   Levi Pruitt is a 74 y.o. male with a hx of CVA, Inferior STEMI (treated medically), chronic systolic HF and  who is being seen today for the evaluation of CHF at the request of Dr. Letta Pate.   1. Acute on Chronic combined HF: EF known at  25-30% with recent acute STEMI which was treated medically. Did receive lasix intermittently while inpatient but not discharged to CIR on lasix. Weigh on admission around 202, up to 207>>205lb today. Now net - 4.9L. He had an episode of respiratory distress overnight and received IV lasix 40mg  with improvement. CXR with worsening bilateral infiltrates. Appears he was transitioned to RA on admission but now back on [place] 2L. Suspect he will need a daily diuretic moving forward.  -- receive lasix 40mg  PO today, says his breathing is better but feels it isn't great. Will give an addition IV lasix 20mg  IV x1 now and continue on 40mg  PO daily.  -- has a foley, would be reasonable to continue for another day or so to monitor lasix response as he is incontinent.  -- tolerating BB, consider adding ARB/spiro if blood pressures remain stable.   2. Acute CVA: admitted to CIR for therapy. On ASA and statin  3. STEMI: treated medically during his recent admission given his comorbid state of CVA, with IV heparin, then transitioned to ASA/plavix.   4. Aspiration PNA: completed 2 weeks of IV antibiotics.   5.  Dysphagia: had a Cortrak while inpatient. Removed and transitioned to dysphagia diet. Developed poor oral intake, and had a PEG placed prior to CIR admission.    For questions or updates, please contact [place] Please consult [email or web address] for contact info under   Signed, Reino Bellis, NP  07/12/2019 10:41 AM'  ATTENDING ATTESTATION  I have seen, examined and evaluated the patient this PM along with Reino Bellis, NP-C.  After reviewing all the available data and chart, we discussed the patients laboratory, study & physical findings as well as symptoms in detail. I agree with her findings, examination as well as impression recommendations as per our discussion.    Levi Pruitt is a 74 year old gentleman who unfortunately suffered a large inferior STEMI in the setting of also pretty debilitating  stroke.  He now has completed an inferior MI with dense inferior inferolateral apical and distal septal hypokinesis on echo.  EF is down about 25 to 30%.  He was treated medically for his MI as he was not a candidate for invasive evaluation with cardiac catheterization or PCI after stroke.  He was discharged to inpatient rehab, and is noted worsening dyspnea and volume overload on x-ray.  He received IV Lasix yesterday, having not been on oral Lasix in rehab.  Notable diuresis overnight and then another dose of IV Lasix and in addition to p.o. this earlier today with brisk diuresis.  Breathing is notably improved on exam today.  I do not think he got lost sleep last night and on my exam was not very interactive, but according to the nurse, he looks notably improved compared to yesterday and this morning.  I do think that this is acute on chronic combined heart failure exacerbation likely related to lack of diuretic.  I do agree with having a standing dose of oral diuretic but low threshold to use IV 20 to 40 mg as needed for shortness of breath episodes.  He seems to be coming close back down to his hospital discharge weight/admission weight to rehab.  He remains on standing dose of beta-blocker, and his blood pressure tolerates with maintaining permissive hypertension per stroke, would consider the addition of ARB plus or minus spironolactone.  For now we will monitor closely as his blood pressure is borderline.  Ultimately would like to consider converting from ARB to [facility] however his blood pressure likely would not tolerate at this point.  Would probably consider spironolactone first to counteract hypokalemic effect of Lasix.  On aspirin Plavix for both stroke and cardiac effect.  Still would not consider ischemic evaluation at this point unless there is exacerbation of ischemic symptoms.  We will follow along.    Glenetta Hew, M.D., M.S. Interventional Cardiologist   Pager #  [phone] Phone # [phone] [address] [place], [place] [address]

## 2019-07-12 NOTE — Progress Notes (Signed)
Physical Therapy Session Note  Patient Details  Name: Levi Pruitt MRN: [id number] Date of Birth: Oct 22, 1945  Today's Date: 07/12/2019 PT Individual Time: [id number] and 1645-1700 PT Individual Time Calculation (min): 43 min and 15 min    Short Term Goals: Week 1:  PT Short Term Goal 1 (Week 1): Pt will perform supine<>sit with max assist of 1 PT Short Term Goal 2 (Week 1): Pt will perform sit<>stand with +2 max assist PT Short Term Goal 3 (Week 1): Pt will perform bed<>chair transfers with +2 max assist PT Short Term Goal 4 (Week 1): Pt will tolerate sitting OOB in TIS for at least 1 hour  Skilled Therapeutic Interventions/Progress Updates:    Session 1: Pt supine in bed asleep upon PT arrival, awakens with verbal/tactile stimuli from therapist. Pt able to maintain alertness during the beginning of the session and then became more lethargic/fatigued as the session progressed. Pt maintained on 1L O2/min via nasal canula with SpO2 88-91% with activities. Pt performed rolling to the R with cues to turn head right, cues and hand over hand assist to reach for bedrail on patients R side with L UE and facilitation to bend patients L knee and bring across body to initiate rolling, mod assist. Pt transferred from sidelying>sitting EOB with max assist, upon sitting patient with R lateral lean, requiring cues and facilitation to correct & come to midline. Pt worked on static sitting balance and able to progress to sitting with CGA briefly for 30 sec before presenting with increased fatigue. Pt transferred from sitting>sidesit propped on L elbow in order to overcorrect R lean, when pt returned to sitting pt with improved midline orientation and ability to maintain static sitting balance. Slideboard transfer to the w/c total assist +2. Pt transported to the gym in w/c. Pt worked on visual scanning to the R while therapist adjusted head rest. Therapist performed gentle cervical R rotation PROM for stretching. Pt  with increased fatigue noted, transported back to room. Plan for patient to sit up in w/c x1 hour for OOB activity tolerance and this therapist to return for transfer back to bed. Left in w/c with needs in reach and daughter present, seat belt in place.   Session 2: Therapist returned to the room to assist patient with transfer back to bed. Pt seated in TIS w/c. Pt kicking L LE and trying to move in w/c. Pt transferred back to bed with +2 assist and use of slideboard, increased time given for initiation, pt with minimal pushing through L LE to help with transfer, pt with L UE on bedrail but with minimal help to pull himself to the bed. Sit>supine with total +2 assist. Pt performed rolling to the R with max +2 assist and noted to be incontinent of bowel, pt left in care of nursing.   Therapy Documentation Precautions:  Precautions Precautions: Fall, Other (comment) Precaution Comments: right hemiparesis, right inattention, right visual field deficit Restrictions Weight Bearing Restrictions: No   Therapy/Group: Individual Therapy  Netta Corrigan, PT, DPT, CSRS 07/12/2019, 7:52 AM

## 2019-07-13 ENCOUNTER — Inpatient Hospital Stay (HOSPITAL_COMMUNITY): Payer: Medicare Other | Admitting: Physical Therapy

## 2019-07-13 ENCOUNTER — Inpatient Hospital Stay (HOSPITAL_COMMUNITY): Payer: Medicare Other | Admitting: Occupational Therapy

## 2019-07-13 ENCOUNTER — Inpatient Hospital Stay (HOSPITAL_COMMUNITY): Payer: Medicare Other | Admitting: Speech Pathology

## 2019-07-13 DIAGNOSIS — I2102 ST elevation (STEMI) myocardial infarction involving left anterior descending coronary artery: Secondary | ICD-10-CM

## 2019-07-13 DIAGNOSIS — R0603 Acute respiratory distress: Secondary | ICD-10-CM

## 2019-07-13 LAB — GLUCOSE, CAPILLARY
Glucose-Capillary: 136 mg/dL — ABNORMAL HIGH (ref 70–99)
Glucose-Capillary: 139 mg/dL — ABNORMAL HIGH (ref 70–99)
Glucose-Capillary: 104 mg/dL — ABNORMAL HIGH (ref 70–99)
Glucose-Capillary: 113 mg/dL — ABNORMAL HIGH (ref 70–99)
Glucose-Capillary: 128 mg/dL — ABNORMAL HIGH (ref 70–99)

## 2019-07-13 MED ORDER — DIPHENOXYLATE-ATROPINE 2.5-0.025 MG/5ML PO LIQD
5.0000 mL | Freq: Four times a day (QID) | ORAL | Status: DC
  Administered 2019-07-13 – 2019-07-14 (×7): 5 mL via ORAL
  Filled 2019-07-13 (×7): qty 5

## 2019-07-13 NOTE — Progress Notes (Signed)
Occupational Therapy Session Note  Patient Details  Name: Levi Pruitt MRN: [id number] Date of Birth: [birth year]  Today's Date: 07/13/2019 OT Individual Time: 0900-1005 OT Individual Time Calculation (min): 65 min    Short Term Goals: Week 1:  OT Short Term Goal 1 (Week 1): Pt will complete UB bathing in supported sitting with min assist and mod instructional cueing. OT Short Term Goal 2 (Week 1): Pt will complete LB bathing sit to stand with total +2 (pt 40%). OT Short Term Goal 3 (Week 1): Pt will locate 2 grooming or bathing items right of midline with no more than mod instructional cueing. OT Short Term Goal 4 (Week 1): Pt will complete squat pivot transfer to the drop arm commode with max assist. OT Short Term Goal 5 (Week 1): Pt will donn a pullover shirt with mod assist in supported sitting.  Skilled Therapeutic Interventions/Progress Updates:    Pt completed some bathing with dressing tasks during session.  Oxygen sats 92% on room air with continuous monitoring at the start of the session.  Pt with decreased alertness throughout, and max instructional cueing to initiate most tasks.  He needed max assist for rolling side to side in order to complete washing of buttocks and peri area.  Total assist for donning of brief with total assist also for transition to sitting EOB.  Max assist for static sitting balance with total +2 (pt 25%) for transfer squat pivot to the tilt in space wheelchair.  He was moved over to the sink where he worked on washing his face with min assist and mod instructional cueing for thoroughness.  Max assist was needed for application of deodorant as well well for use of shampoo cap.  He was able to donn a button up shirt with max assist for threading the RUE and bringing it around his back.  He was then able to insert his LUE and therapist assisted with all buttons.  He needed total +2 (pt 25%) for donning pants sit to stand.  Finished session with pt in the tilt in space  wheelchair and call button and phone in reach with safety belts in place.  Nursing made aware of pt being up out of bed.    Therapy Documentation Precautions:  Precautions Precautions: Fall, Other (comment) Precaution Comments: right hemiparesis, right inattention, right visual field deficit Restrictions Weight Bearing Restrictions: No  Pain: Pain Assessment Pain Scale: Faces Pain Score: 0-No pain ADL: See Care Tool Section for some details of mobility and selfcare  Therapy/Group: Individual Therapy  Laneka Mcgrory OTR/L 07/13/2019, 11:11 AM

## 2019-07-13 NOTE — Progress Notes (Signed)
Occupational Therapy Session Note  Patient Details  Name: Levi Pruitt MRN: [id number] Date of Birth: 1946-01-28  Today's Date: 07/13/2019 OT Individual Time: [id number] Individual Time Calculation (min): 38 min    Short Term Goals: Week 1:  OT Short Term Goal 1 (Week 1): Pt will complete UB bathing in supported sitting with min assist and mod instructional cueing. OT Short Term Goal 2 (Week 1): Pt will complete LB bathing sit to stand with total +2 (pt 40%). OT Short Term Goal 3 (Week 1): Pt will locate 2 grooming or bathing items right of midline with no more than mod instructional cueing. OT Short Term Goal 4 (Week 1): Pt will complete squat pivot transfer to the drop arm commode with max assist. OT Short Term Goal 5 (Week 1): Pt will donn a pullover shirt with mod assist in supported sitting.  Skilled Therapeutic Interventions/Progress Updates:    Upon entering the room, pt supine in bed and daughter present in room. Pt appearing lethargic initially but becoming more alert as therapist spoke to him. Supine >sit with total A to EOB. Pt sitting EOB for 10 minutes with significant posterior lean and OT attempting anterior weight shift but pt very resistant. Total A for balance on EOB. Pt verbalizing " 25 grand" and " 25 thousand dollars" while seated on EOB. Pt's daughter standing at midline and then to R with total A for forward head to attempt to locate but pt unable to attend to task. Pt leaning to L attempting to return to bed and needing +2 assistance to position for safety sit >supine. OT performed PROM on R UE for stretching but pt did not attend to UE during task. No c/o pain. PROM in all planes of movement x 5 reps. Bed lowered for safety and bed alarm activated.   Therapy Documentation Precautions:  Precautions Precautions: Fall, Other (comment) Precaution Comments: right hemiparesis, right inattention, right visual field deficit Restrictions Weight Bearing Restrictions:  No General: General PT Missed Treatment Reason: Patient fatigue Vital Signs:  Pain: Pain Assessment Pain Scale: Faces Faces Pain Scale: No hurt ADL: ADL Eating: Dependent Where Assessed-Eating: Bed level Grooming: Maximal assistance Where Assessed-Grooming: Bed level Upper Body Bathing: Maximal assistance Where Assessed-Upper Body Bathing: Bed level Lower Body Bathing: Dependent Where Assessed-Lower Body Bathing: Bed level Upper Body Dressing: Dependent Where Assessed-Upper Body Dressing: Edge of bed Lower Body Dressing: Dependent Where Assessed-Lower Body Dressing: Bed level Toileting: Dependent Where Assessed-Toileting: Glass blower/designer: Dependent Armed forces technical officer Method: Other (comment)(bedpan)   Therapy/Group: Individual Therapy  Gypsy Decant 07/13/2019, 4:08 PM

## 2019-07-13 NOTE — Progress Notes (Signed)
Physical Therapy Session Note  Patient Details  Name: Levi Pruitt MRN: [id number] Date of Birth: 18-Oct-1945  Today's Date: 07/13/2019 PT Individual Time: [id number] and [id number] PT Individual Time Calculation (min): 50 min  And 33 min and  Today's Date: 07/13/2019 PT Missed Time: 10 Minutes Missed Time Reason: Patient fatigue  Short Term Goals: Week 1:  PT Short Term Goal 1 (Week 1): Pt will perform supine<>sit with max assist of 1 PT Short Term Goal 2 (Week 1): Pt will perform sit<>stand with +2 max assist PT Short Term Goal 3 (Week 1): Pt will perform bed<>chair transfers with +2 max assist PT Short Term Goal 4 (Week 1): Pt will tolerate sitting OOB in TIS for at least 1 hour  Skilled Therapeutic Interventions/Progress Updates:    Session 1: Pt received sitting in TIS w/c, alert, eyes open and able to respond verbally to therapist's questions and agreeable to session. Pt received and maintained on 1L of O2. SpO2 92% at beginning of session with HR 64bpm. Transported to/from gym in w/c. R lateral scoot transfer w/c>EOM using transfer board with +2 max assist for board placement and scooting but pt demonstrating ability to initiate scooting to assist with transfer. Sitting EOM ~69minutes with R UE positioned for weight bearing while participating in cognitive and R attention task of identifying color of 4 bean bags - pt unable to correctly state colors despite max multimodal cuing and clues - required predominately min assist for static sitting balance during this task. Pt becoming increasingly fatigued with eyes closing - provided supported sitting therapeutic rest break.Throughout sitting EOM pt's SpO2 and HR monitored and WNL (>90% SpO2 and HR in 60s). L lateral scoot transfer EOM>w/c using transfer board with +2 max assist for board placement and scooting again pt demonstrating ability to initiate and assist with task - cuing for sequencing. Transported back to room and pt's daughter, [name],  present - therapist updated her on focus on therapy session. Pt noted to have effortful breathing while still sitting in TIS but resolved. L lateral scoot transfer to EOB as described above. Again pt noted to have increased effort with breathing therefore reattached room SpO2 and HR monitor with his SpO2 reading as low as 77% therefore therapist increased to 3.5L of O2 and SpO2 recovered to 88% - RN notified of event - sit>supine with +2 max assist for B LE management and trunk descent. Once SpO2 recovered to 90% returned to 1L of O2 and SpO2 maintaining around 88-90% and RN present and aware. Pt left supine in the bed with needs in reach, lines intact, and RN present. Missed 10 minutes of skilled physical therapy.  Session 2: Pt received supine in bed with his daughter, [name], present and pt agreeable to therapy session. RN present for medication administration. Supine>sit, HOB partially elevated and using bedrails, with total assist of 1 for B LE management and trunk upright - multimodal cuing for sequencing. Pt initially requiring mod assist for trunk support progressed to CGA for static sitting EOB. While sitting EOB engaged pt in visual scanning and cognitive task of stating the names of family/friends from a photo in his room - pt only able to correctly identify 1 person. Tolerated sitting EOB for ~67minutes until fatigued starting to close eyes and requesting to return to supine. Sit>supine with +2 max assist for B LE management and trunk descent. Rolling R/L in bed with mod/max assist using bedrails while therapist performed max assist LB clothing management and total assist peri-care as  pt noted to have small BM. Pt left supine in bed with needs in reach, HOB elevated, and pt's daughter present.   Therapy Documentation Precautions:  Precautions Precautions: Fall, Other (comment) Precaution Comments: right hemiparesis, right inattention, right visual field deficit Restrictions Weight Bearing  Restrictions: No  Pain: Session 1: Denies pain during session.  Session 2: Denies pain during session.   Therapy/Group: Individual Therapy  Tawana Scale, PT, DPT 07/13/2019, 8:02 AM

## 2019-07-13 NOTE — Progress Notes (Signed)
[place] PHYSICAL MEDICINE & REHABILITATION PROGRESS NOTE   Subjective/Complaints:  Poor trunkal control  Remains fatigued , appreciate cardiology note   ROS- unable to obtain due to cognition   Objective:   DG Chest Port 1 View  Result Date: 07/11/2019 CLINICAL DATA:  Acute respiratory distress EXAM: PORTABLE CHEST 1 VIEW COMPARISON:  07/09/2019 FINDINGS: Cardiac shadow is stable. Increasing bilateral infiltrates are seen particularly in the right upper lobe. No sizable effusion is seen. No bony abnormality is noted. IMPRESSION: Increase in bilateral infiltrates particularly in the right upper lobe. Electronically Signed   By: Inez Catalina M.D.   On: 07/11/2019 22:37   Recent Labs    07/12/19 0558  WBC 12.9*  HGB 14.6  HCT 45.9  PLT 277   Recent Labs    07/12/19 0558  NA 141  K 4.3  CL 101  CO2 29  GLUCOSE 128*  BUN 22  CREATININE 1.05  CALCIUM 8.6*    Intake/Output Summary (Last 24 hours) at 07/13/2019 0930 Last data filed at 07/13/2019 0700 Gross per 24 hour  Intake 0 ml  Output 950 ml  Net -950 ml     Physical Exam: Vital Signs Blood pressure 114/64, pulse 60, temperature 97.8 F (36.6 C), temperature source Axillary, resp. rate (!) 22, weight 92 kg, SpO2 94 %. Nursing note and vitals reviewed. Constitutional: He appears well-developed and well-nourished. No distress. Nasal cannula in place. Lethargic.  HENT:  Head: Normocephalic and atraumatic.  Eyes: Conjunctivae are normal. Right eye exhibits no discharge. Left eye exhibits no discharge.  Left gaze preference  Neck: No tracheal deviation present. No thyromegaly present.  Cardiovascular: Normal rate.  Respiratory: Belly breathing. No stridor. No respiratory distress.  Finley off  GI: Soft. He exhibits no distension. There is no abdominal tenderness.  PEG site with dry dressing.   Musculoskeletal:        General: Edema present.     Comments: Mild edema RUE/RLE.   Neurological: He is alert.  Oriented to  self only.  Dysarthria and language of confusion noted.  Motor: Limited due to participation, however dense right hemiparesis noted LUE/LLE:?  3+-4 -/5 proximal distal.  Repeating "Uh-huh".  Skin: Skin is warm and dry. He is not diaphoretic.  Bilateral abrasions to knees  Psychiatric:  Flat affect and appears disengaged.    Assessment/Plan: 1. Functional deficits secondary to Left PCA infarct  which require 3+ hours per day of interdisciplinary therapy in a comprehensive inpatient rehab setting.  Physiatrist is providing close team supervision and 24 hour management of active medical problems listed below.  Physiatrist and rehab team continue to assess barriers to discharge/monitor patient progress toward functional and medical goals  Care Tool:  Bathing    Body parts bathed by patient: Face   Body parts bathed by helper: Right arm, Left arm, Chest, Abdomen, Front perineal area, Buttocks, Right upper leg, Left upper leg, Right lower leg, Left lower leg     Bathing assist Assist Level: 2 Helpers     Upper Body Dressing/Undressing Upper body dressing   What is the patient wearing?: Pull over shirt    Upper body assist Assist Level: Maximal Assistance - Patient 25 - 49%    Lower Body Dressing/Undressing Lower body dressing      What is the patient wearing?: Incontinence brief     Lower body assist Assist for lower body dressing: 2 Helpers     Toileting Toileting    Toileting assist Assist for toileting: Total  Assistance - Patient < 25%     Transfers Chair/bed transfer  Transfers assist  Chair/bed transfer activity did not occur: Safety/medical concerns  Chair/bed transfer assist level: 2 Helpers(slideboard)     Locomotion Ambulation   Ambulation assist   Ambulation activity did not occur: Safety/medical concerns          Walk 10 feet activity   Assist  Walk 10 feet activity did not occur: Safety/medical concerns        Walk 50 feet  activity   Assist Walk 50 feet with 2 turns activity did not occur: Safety/medical concerns         Walk 150 feet activity   Assist Walk 150 feet activity did not occur: Safety/medical concerns         Walk 10 feet on uneven surface  activity   Assist Walk 10 feet on uneven surfaces activity did not occur: Safety/medical concerns         Wheelchair     Assist Will patient use wheelchair at discharge?: Yes(TBD) Type of Wheelchair: Manual(LT goals set for discharge )           Wheelchair 50 feet with 2 turns activity    Assist    Wheelchair 50 feet with 2 turns activity did not occur: Safety/medical concerns       Wheelchair 150 feet activity     Assist  Wheelchair 150 feet activity did not occur: Safety/medical concerns       Blood pressure 114/64, pulse 60, temperature 97.8 F (36.6 C), temperature source Axillary, resp. rate (!) 22, weight 92 kg, SpO2 94 %.  Medical Problem List and Plan: 1.  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  Antithrombotics: -DVT/anticoagulation:  Pharmaceutical: Heparin doppler neg , no renal failure will switch to Lovenox to limit injections              -antiplatelet therapy: DAPT X 3 months followed by ASA alone.   3. Pain Management:  Tylenol prn.  4. Mood: LCSW to follow  for evaluation and support when appropriate.              -antipsychotic agents: N/A 5. Neuropsych: This patient is not capable of making decisions on his own behalf. 6. Skin/Wound Care: Routine pressure relief measures. Needs air mattress as unable to position himself.  Freq stools with peri anal erythema , no open areas , moisture barrier , try to keep dry ,7. Fluids/Electrolytes/Nutrition: Monitor I/O.   8. Aspiration PNA: Leucocytosis resolving. Completed 2 week course of Rocephin 1/22 and Zithromax X 5 days. Oral candida treated with diflucan X & days.Remains afebrile CXR lagging clinical picture  9. Severe post stroke dysphagia: PEG placed on 01/26--Tube feeds being slowly advanced every 4 hours to prevent refeeding syndrome.  Advance diet as tolerated 10 Malnutrition: At risk for refeeding syndrome--need to check  Mg             Phos and K daily X 2 11. Acute on chronic CHF: Monitor for signs and symptoms of fluid overload.  Blood pressures remain soft--SBP 90's-110.               Daily weights- Strict I/O has foley now x 3 d, will cont unless cardiology feels we no longer need it, pt is incont  12. Inferior STEMI: On Lipitor, Coreg BID 13. Urinary retention: Has not voided for since > 18 hours. Needs PVRs as well as caths for volumes > 350cc.              UA/urine culture ordered 14. Hypoxia: Continues to drop to 80's requires deep suctioning and 2 liters per [facility].  Discontinue IVF.              Chest x-ray ordered as showing signs of fluid overload. CXR reviewed 1/30 and is stable. Spoke with RT last night and decreased water flushes to 150 and TF to 50 to minimize fluid overload. Foley inserted for strict I/Os and to guide further Lasix dosing.  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   17, Diarrhea  1/31: C diff negative, still with loose stools and buttock  breakdown. Will order Lomotil scheduled  Since pt has received it only once 18. Insomnia: will start melatonin 6mg  at night. Patient was up all last night and thus slept during day and missed therapies. Wife agreeable to melatonin to help regulate sleep cycle. She does not want other sleep agents.   LOS: 6 days A FACE TO Lar[place] E Pao Haffey 07/13/2019, 9:30 AM

## 2019-07-13 NOTE — Progress Notes (Addendum)
Progress Note  Patient Name: Levi Pruitt Date of Encounter: 07/13/2019  Primary Cardiologist: New   Subjective   Pt up to chair this AM> No complaints. Appears euvolemic on exam.   Inpatient Medications    Scheduled Meds: . aspirin  324 mg Per Tube Daily  . atorvastatin  80 mg Per Tube q1800  . carvedilol  3.125 mg Per Tube BID WC  . chlorhexidine  15 mL Mouth Rinse BID  . Chlorhexidine Gluconate Cloth  6 each Topical Daily  . clopidogrel  75 mg Per Tube Daily  . diphenoxylate-atropine  5 mL Oral QID  . enoxaparin (LOVENOX) injection  40 mg Subcutaneous Q24H  . free water  150 mL Per Tube Q6H  . furosemide  40 mg Oral Daily  . Gerhardt's butt cream   Topical TID  . insulin aspart  0-9 Units Subcutaneous Q4H  . mouth rinse  15 mL Mouth Rinse q12n4p   Continuous Infusions: . feeding supplement (OSMOLITE 1.5 CAL) 1,000 mL (07/12/19 2045)   PRN Meds: acetaminophen, alum & mag hydroxide-simeth, bisacodyl, diphenhydrAMINE, guaiFENesin-dextromethorphan, ipratropium-albuterol, lidocaine, Melatonin, polyethylene glycol, prochlorperazine **OR** prochlorperazine **OR** prochlorperazine, sodium phosphate   Vital Signs    Vitals:   07/13/19 0157 07/13/19 0225 07/13/19 0416 07/13/19 0430  BP:   114/64   Pulse:   60   Resp: (!) 26  (!) 22   Temp:   97.8 F (36.6 C)   TempSrc:   Axillary   SpO2: 94% 94% 94%   Weight:    92 kg    Intake/Output Summary (Last 24 hours) at 07/13/2019 1026 Last data filed at 07/13/2019 0700 Gross per 24 hour  Intake 0 ml  Output 950 ml  Net -950 ml   Filed Weights   07/11/19 0404 07/12/19 0500 07/13/19 0430  Weight: 94 kg 93 kg 92 kg    Physical Exam   General: Ill appearing, NAD Neck: Negative for carotid bruits. No JVD Lungs:Clear to ausculation bilaterally. No wheezes, rales, or rhonchi. Breathing is unlabored. Cardiovascular: RRR with S1 S2. No murmurs Abdomen: Soft, non-tender, non-distended. No obvious abdominal  masses. Extremities: No edema. DP pulses 2+ bilaterally Neuro: Alert and oriented. No focal deficits. No facial asymmetry. MAE spontaneously. Psych: Responds to questions appropriately with normal affect.    Labs    Chemistry Recent Labs  Lab 07/07/19 0215 07/08/19 0520 07/12/19 0558  NA 140 142 141  K 3.5 3.6 4.3  CL 102 102 101  CO2 27 26 29   GLUCOSE 85 119* 128*  BUN 12 12 22   CREATININE 1.10 1.05 1.05  CALCIUM 8.3* 8.7* 8.6*  PROT  --  5.5*  --   ALBUMIN 2.2* 2.1*  --   AST  --  49*  --   ALT  --  45*  --   ALKPHOS  --  88  --   BILITOT  --  0.9  --   GFRNONAA >60 >60 >60  GFRAA >60 >60 >60  ANIONGAP 11 14 11      Hematology Recent Labs  Lab 07/07/19 0215 07/08/19 0520 07/12/19 0558  WBC 11.9* 10.7* 12.9*  RBC 4.67 4.49 4.76  HGB 14.4 14.0 14.6  HCT 45.1 42.1 45.9  MCV 96.6 93.8 96.4  MCH 30.8 31.2 30.7  MCHC 31.9 33.3 31.8  RDW 13.5 13.4 13.7  PLT 281 261 277    Cardiac EnzymesNo results for input(s): TROPONINI in the last 168 hours. No results for input(s): TROPIPOC in the last  168 hours.   BNPNo results for input(s): BNP, PROBNP in the last 168 hours.   DDimer No results for input(s): DDIMER in the last 168 hours.   Radiology    DG Chest Port 1 View  Result Date: 07/11/2019 CLINICAL DATA:  Acute respiratory distress EXAM: PORTABLE CHEST 1 VIEW COMPARISON:  07/09/2019 FINDINGS: Cardiac shadow is stable. Increasing bilateral infiltrates are seen particularly in the right upper lobe. No sizable effusion is seen. No bony abnormality is noted. IMPRESSION: Increase in bilateral infiltrates particularly in the right upper lobe. Electronically Signed   By: Inez Catalina M.D.   On: 07/11/2019 22:37   Telemetry    07/13/19 NSR HR 60's- Personally Reviewed  ECG    No new tracing as of 07/13/19 - Personally Reviewed  Cardiac Studies   TTE: 06/25/19  IMPRESSIONS   1. Left ventricular ejection fraction, by visual estimation, is 30 to  35%. The left  ventricle has moderate to severely decreased function. There  is mildly increased left ventricular hypertrophy.  2. The left ventricle demonstrates regional wall motion abnormalities.  Inferior/inferoseptal/apical akinesis  3. Moderately dilated left ventricular internal cavity size.  4. Left ventricular diastolic parameters are consistent with Grade III  diastolic dysfunction (restrictive).  5. Elevated left atrial pressure.  6. Global right ventricle has normal systolic function.The right  ventricular size is normal.  7. The mitral valve is normal in structure. Trivial mitral valve  regurgitation.  8. The tricuspid valve is normal in structure.  9. The aortic valve is tricuspid. Aortic valve regurgitation is not  visualized. No evidence of aortic valve sclerosis or stenosis.  10. The pulmonic valve was not well visualized. Pulmonic valve  regurgitation is not visualized.  11. TR signal is inadequate for assessing pulmonary artery systolic  pressure.  12. The inferior vena cava is normal in size with greater than 50%  respiratory variability, suggesting right atrial pressure of 3 mmHg.   Patient Profile     74 y.o. male with a hx of CVA, Inferior STEMI (treated medically), chronic systolic HF and  who is being seen today for the evaluation of CHF at the request of Dr. Letta Pate.   Assessment & Plan    1. Acute on Chronic combined HF:  -EF known to be reduced at 25-30% with recent acute STEMI>>treated medically. Previously treated with IV Lasix while inpatient however was not discharged on therapy. -Weight, 202lb -I&O, net negative 5.8L --since he is incontinent at this time, may be getting 1 weight now as a dry weight and continue to follow daily weights as opposed to quantifying daily urine output is probably reasonable. -Continue lasix 40mg  PO>>appears euvolemic on exam  -Tolerating BB, consider adding ARB/spiro if blood pressures remain stable>>BP likely a little too low  to add spiro at this time   2. Acute CVA: -Admitted to CIR for therapy.  -On high dose ASA, Plavix and statin  3. STEMI:  -Treated medically during his recent admission given his comorbid state of CVA>>>transitioned to ASA/plavix.   4. Aspiration PNA:  -Completed 2 weeks of IV antibiotics  5. Dysphagia:  -Previously had Cortrak while inpatient. Removed and transitioned to dysphagia diet. -Developed poor oral intake, and had a PEG placed prior to CIR admission.    Signed, Kathyrn Drown NP-C [facility] Pager: [phone] 07/13/2019, 10:26 AM    ATTENDING ATTESTATION  I have seen, examined and evaluated the patient this PM along with Kathyrn Drown, NP.  After reviewing all the available data and  chart, we discussed the patients laboratory, study & physical findings as well as symptoms in detail. I agree with her findings, examination as well as impression recommendations as per our discussion.    Attending adjustments noted in italics.   Overall seems to be relatively stable.  Blood pressure is maybe a little too soft to consider spironolactone today.  Doing well on current oral dose of Lasix, continue to monitor blood pressure as we may want to add low-dose 12.5 mg.  Probably can switch to daily weights as opposed to try to measure urine out strict I's and O's.  Perhaps checking a dry weight today as baseline and then daily weights after.  We will adjust Lasix based on changes in dry weight  With diuresis, function potassium looks stable.  Intermittent lab checks of chemistry panel warranted while next adjusting his Lasix.  For now no major changes.  Agree with note above.    Glenetta Hew, M.D., M.S. Interventional Cardiologist   Pager # [phone] Phone # [phone] [address] [place], [place] [address]   For questions or updates, please contact   Please consult [email or web address] for contact info under Cardiology/STEMI.

## 2019-07-13 NOTE — Progress Notes (Signed)
Speech Language Pathology Daily Session Note  Patient Details  Name: Levi Pruitt MRN: [id number] Date of Birth: [birth year]  Today's Date: 07/13/2019 SLP Individual Time: 1400-1433 SLP Individual Time Calculation (min): 33 min  Short Term Goals: Week 1: SLP Short Term Goal 1 (Week 1): Pt will consume therapeutic trials of puree and honey thick boluses demonstrating appropriate bolus awareness, lingual manipulation, and AP transit with Max A cues. SLP Short Term Goal 2 (Week 1): Pt will sustain attention to functional and familiar tasks for 2 minute intervals with Max A multimodal cues. SLP Short Term Goal 3 (Week 1): Pt will follow 2-step directions with 50% accuracy provided Max A multimodal cues. SLP Short Term Goal 4 (Week 1): Pt will orient to time and plcae with Max A verbal/visual cues. SLP Short Term Goal 5 (Week 1): Pt will use strategies to achieve 75% intelligibilty at the phrase level with Max A cues.  Skilled Therapeutic Interventions: Pt was seen for skilled ST targeting cognitive linguistic goals. Pt was lethargic upon arrival, required Max A multimodal cues to arouse enough for participation in interventions. Pt was oriented to self, however Total A required for orientation to place, situation, and season. He completed ~1/4 process of oral care, otherwise requiring assistance from SLP for full completion. Provided Mod A verbal and visual cues for task initiation, pt washed his face with a washrag. Attempts to engage in verbal attention tasks largely unsuccessful due to lethargy and reduced sustained attention (<30 second intervals). Pt responded to cues to increase vocal intensity for greater speech intelligibility in ~25% opportunities when provided Max A verbal cues. Pt unable to participate in PO trials and missed 27 mins skilled ST due to fatigue. Communicated with RN regarding white coating on tongue. Pt left laying in bed with alarm set and needs within reach. Continue per current  plan of care.       Pain Pain Assessment Pain Scale: Faces Faces Pain Scale: No hurt  Therapy/Group: Individual Therapy  Arbutus Leas 07/13/2019, 3:00 PM

## 2019-07-14 ENCOUNTER — Inpatient Hospital Stay (HOSPITAL_COMMUNITY): Payer: Medicare Other | Admitting: Occupational Therapy

## 2019-07-14 ENCOUNTER — Inpatient Hospital Stay (HOSPITAL_COMMUNITY): Payer: Medicare Other | Admitting: Speech Pathology

## 2019-07-14 ENCOUNTER — Inpatient Hospital Stay (HOSPITAL_COMMUNITY): Payer: Medicare Other

## 2019-07-14 LAB — GLUCOSE, CAPILLARY
Glucose-Capillary: 114 mg/dL — ABNORMAL HIGH (ref 70–99)
Glucose-Capillary: 119 mg/dL — ABNORMAL HIGH (ref 70–99)
Glucose-Capillary: 156 mg/dL — ABNORMAL HIGH (ref 70–99)
Glucose-Capillary: 157 mg/dL — ABNORMAL HIGH (ref 70–99)
Glucose-Capillary: 88 mg/dL (ref 70–99)
Glucose-Capillary: 94 mg/dL (ref 70–99)
Glucose-Capillary: 99 mg/dL (ref 70–99)

## 2019-07-14 MED ORDER — METHYLPHENIDATE HCL 5 MG PO TABS
5.0000 mg | ORAL_TABLET | Freq: Two times a day (BID) | ORAL | Status: DC
  Administered 2019-07-14: 12:00:00 5 mg via ORAL
  Filled 2019-07-14: qty 1

## 2019-07-14 MED ORDER — PRO-STAT SUGAR FREE PO LIQD
30.0000 mL | Freq: Two times a day (BID) | ORAL | Status: DC
  Administered 2019-07-14: 30 mL via ORAL
  Filled 2019-07-14 (×2): qty 30

## 2019-07-14 MED ORDER — JEVITY 1.5 CAL/FIBER PO LIQD
1000.0000 mL | ORAL | Status: DC
  Administered 2019-07-14: 1000 mL via ORAL
  Filled 2019-07-14 (×2): qty 1000

## 2019-07-14 NOTE — Patient Care Conference (Signed)
Inpatient RehabilitationTeam Conference and Plan of Care Update Date: 07/14/2019   Time: 10:45 AM    Patient Name: Levi Pruitt      Medical Record Number: [id number]  Date of Birth: February 03, 1946 Sex: Male         Room/Bed: 4W22C/4W22C-01 Payor Info: Payor: MEDICARE / Plan: MEDICARE PART A AND B / Product Type: *No Product type* /    Admit Date/Time:  07/07/2019  4:18 PM  Primary Diagnosis:  Arterial ischemic stroke, PCA (posterior cerebral artery), left, acute [facility])  Patient Active Problem List   Diagnosis Date Noted  . Arterial ischemic stroke, PCA (posterior cerebral artery), left, acute ([place]) 07/07/2019  . CHF (congestive heart failure) ([place])   . Dysphagia, post-stroke   . Stenosis of right carotid artery   . Acute respiratory failure with hypoxia ([place])   . Urinary retention   . Weakness generalized   . Palliative care by specialist   . DNR (do not resuscitate) discussion   . Dysphagia   . ST elevation myocardial infarction (STEMI) ([place])   . LV dysfunction   . Stroke due to embolism of posterior cerebral artery ([facility]) 06/18/2019  . Abnormal ECG 06/18/2019  . Acute ischemic stroke ([place]) 06/18/2019  . Parotid mass 06/18/2019  . AKI (acute kidney injury) ([place]) 06/18/2019  . Acute ST elevation myocardial infarction (STEMI) of inferior wall [facility])     Expected Discharge Date: Expected Discharge Date: (Estimated LOS 1-2 weeks, expect SNF placement at DC)  Team Members Present: Physician leading conference: Dr. Alysia Penna Social Worker Present: Lennart Pall, LCSW Nurse Present: Dorien Chihuahua, RN;Other (comment)(Olivia Capranica, RN) Case Manager: Karene Fry, RN PT Present: Michaelene Song, PT OT Present: Clyda Greener, OT SLP Present: Jettie Booze, CF-SLP PPS Coordinator present : Ileana Ladd, Burna Mortimer, SLP     Current Status/Progress Goal Weekly Team Focus  Bowel/Bladder   patient is incontient of bowels and have to empty bladder q8 if no void via coude catheter   obtain continence and able to urinate on its own  q shift and PRN assessment and reinforcement toileting to decrease incontinence of bowel and bladder   Swallow/Nutrition/ Hydration   NPO, PEG, limited PO trials this week due to lethargy and worsening CXRs  Mod A least restrictive diet  Continue D1/honey trials (recc after last MBSS) when fully altert, upgrade when oral holding and alertness, attention improves   ADL's   Mod assist for UB bathing in supported sitting with max assist for UB dressing.  LB bathing max to total supine to sit with total +2 (pt 25%) for sit to stand during LB dressing.  Total +2 (pt 25%) is needed for squat pivot transfers from bed to the tilt in space wheelchair.  Pt with decreased alertness at times as well as moderate right visual field deficit and right inattention.  RUE is currently Brunnstrum stage I in the hand and the digits.  min to mod assist overall  selfcare retraining, transfer training, balance retraining, neuromuscular re-education, vision compensation, positining, cognitive retrainig, NMES, pt/family education   Mobility   +2 max assist for bed mobility and slide board transfers to/from TIS w/c, min assist static sitting balance on mat  min assist bed mobility and functional transfers, mod assist 62ft gait, supervision w/c propulsion 40ft  activity tolerance, bed mobility, transfers, unsupported sitting, R attention, pt/family education   Communication   Max A, dysarthric and language of confusion  Mod A  speech intelligibility strategies   Safety/Cognition/ Behavioral Observations  Max-Toal A  Mod A  sustained attention, orientation, recall   Pain   patient denies pain  keeping pain mild/ tolerable at rest or with activity  q shift and PRN pain assessment   Skin   ecchymosis in arms and abdomen, MASD in perineum, groin, buttocks  keeping skin from redness, breakdown, and breakdown  q shift and PRN skin assessment    Rehab Goals Patient on target to  meet rehab goals: Yes *See Care Plan and progress notes for long and short-term goals.     Barriers to Discharge  Current Status/Progress Possible Resolutions Date Resolved   Nursing                  PT                    OT                  SLP                SW Decreased caregiver support;Lack of/limited family support;Inaccessible home environment;Home environment access/layout Lived alone prior to admission; Friend/POA [name] works during the day Plan is SNF placement due to lack of support/care at discharge          Discharge Planning/Teaching Needs:  Plan to discharge to a SNF  TBD   Team Discussion: PEG placed, large PCA infarct, motor involvement, lethargic, CHF, cardiology following.  RN red bottom, cream applied, cathed 300 cc last night, bladder scanning q6h.  OT mod/max UB B, LB B tot +2, UB D max and LB D tot +2, R inattention, R field cut, may need 15/7 therapies, goals min/mod A.  PT ?needs 15/7 tx, min/mod goals, now bed and transfers max/tot A.  SLP NPO, PEG, speech dysarthria.  Plan is for SNF placement.   Revisions to Treatment Plan: N/A     Medical Summary Current Status: Tolerating G-tube feeds, p.o. limited by level of alertness in terms of trials with speech.  Poor truncal stability but at times can stand if he is very alert. Weekly Focus/Goal: Trial of neuro stimulant  Barriers to Discharge: Incontinence;Decreased family/caregiver support   Possible Resolutions to Barriers: See above, work on level of alertness, toileting program   Continued Need for Acute Rehabilitation Level of Care: The patient requires daily medical management by a physician with specialized training in physical medicine and rehabilitation for the following reasons: Direction of a multidisciplinary physical rehabilitation program to maximize functional independence : Yes Medical management of patient stability for increased activity during participation in an intensive rehabilitation  regime.: Yes Analysis of laboratory values and/or radiology reports with any subsequent need for medication adjustment and/or medical intervention. : Yes   I attest that I was present, lead the team conference, and concur with the assessment and plan of the team.   Jodell Cipro M 07/14/2019, 9:01 PM   Team conference was held via web/ teleconference due to [place] - 19

## 2019-07-14 NOTE — Progress Notes (Signed)
RT called regarding pt wheezing and pt desatting. RT gave pt PRN breathing treatment, pt sats went up to 92%. RT placed pt on VM 14L 55%. Pt sats 92% at this time RN aware.

## 2019-07-14 NOTE — Progress Notes (Signed)
Physical Therapy Session Note  Patient Details  Name: Levi Pruitt MRN: [id number] Date of Birth: 07-29-1945  Today's Date: 07/14/2019 PT Individual Time: 1828-8337 PT Individual Time Calculation (min): 17 min   Short Term Goals: Week 1:  PT Short Term Goal 1 (Week 1): Pt will perform supine<>sit with max assist of 1 PT Short Term Goal 2 (Week 1): Pt will perform sit<>stand with +2 max assist PT Short Term Goal 3 (Week 1): Pt will perform bed<>chair transfers with +2 max assist PT Short Term Goal 4 (Week 1): Pt will tolerate sitting OOB in TIS for at least 1 hour  Skilled Therapeutic Interventions/Progress Updates: Pt presented in bed with nsg present completing toileting. Session focused on sitting balance at EOB. Performed supine to sit maxA x 1. While sitting EOB x 10 min PTA engaged pt in sitting with hands on lap, attempted reaching with LUE to target. Pt was able to reach towards hand but required [facility] assist to increase anterior lean. PTA also encouraged lateral leans to R (resting on therapist) then using trunk to return to midline (requiring minA). Pt returned to bed at end of session maxA x 2, repositioned to comfort and left with soft touch bell within reach and needs met.        Therapy Documentation Precautions:  Precautions Precautions: Fall Precaution Comments: right hemiparesis, right inattention, right visual field deficit Restrictions Weight Bearing Restrictions: No General: PT Amount of Missed Time (min): 13 Minutes PT Missed Treatment Reason: Toileting Vital Signs: Therapy Vitals Temp: 98.4 F (36.9 C) Pulse Rate: 68 Resp: 19 BP: 112/70 Patient Position (if appropriate): Lying Oxygen Therapy SpO2: 96 % O2 Device: Nasal Cannula O2 Flow Rate (L/min): 2 L/min Pain: Pain Assessment Pain Scale: 0-10 Pain Score: 0-No pain  Therapy/Group: Individual Therapy  Hasna Stefanik  Ulysees Robarts, PTA  07/14/2019, 4:10 PM

## 2019-07-14 NOTE — Progress Notes (Signed)
Speech Language Pathology Daily Session Note  Patient Details  Name: Levi Pruitt MRN: [id number] Date of Birth: 11-Feb-1946  Today's Date: 07/14/2019 SLP Individual Time: 1255-1340 SLP Individual Time Calculation (min): 45 min  Short Term Goals: Week 1: SLP Short Term Goal 1 (Week 1): Pt will consume therapeutic trials of puree and honey thick boluses demonstrating appropriate bolus awareness, lingual manipulation, and AP transit with Max A cues. SLP Short Term Goal 2 (Week 1): Pt will sustain attention to functional and familiar tasks for 2 minute intervals with Max A multimodal cues. SLP Short Term Goal 3 (Week 1): Pt will follow 2-step directions with 50% accuracy provided Max A multimodal cues. SLP Short Term Goal 4 (Week 1): Pt will orient to time and plcae with Max A verbal/visual cues. SLP Short Term Goal 5 (Week 1): Pt will use strategies to achieve 75% intelligibilty at the phrase level with Max A cues.  Skilled Therapeutic Interventions: Pt was seen for skilled ST targeting cognitive-linguistic goals. Pt's daughter was at bedside and receptive to educational opportunities throughout session. Pt was still lethargic but more alert and participatory today in comparison to yesterday. He assisted with completion of oral care when provided with Max faded to Mod A verbal and tactile assist. When provided with multiple choice cues, he identified hospital as location but required Total A for orientation to city, situation, and time. In a task targeting sustained attention and visual scanning, Max A verbal and visual cues were provided for locating colored blocks in right visual field. Pt required Mod-Max A verbal cues for awareness of errors when he would pick the the incorrect color block. Pt mostly unaware of verbal errors. He sustained attention to task for ~60 second intervals with Max A multimodal cues. He came more distractible throughout session, however fully participatory until the end. Mod  increasing to Max A required of initiation to follow directions to pick up colored blocks. Pt left laying in bed with alarm set and needs within reach, daughter still present. Continue per current plan of care.       Pain Pain Assessment Pain Scale: 0-10 Pain Score: 0-No pain  Therapy/Group: Individual Therapy  Arbutus Leas 07/14/2019, 2:07 PM

## 2019-07-14 NOTE — Progress Notes (Signed)
Physical Therapy Session Note  Patient Details  Name: Levi Pruitt MRN: [id number] Date of Birth: 1946-02-16  Today's Date: 07/14/2019 PT Individual Time: 0803-0900 PT Individual Time Calculation (min): 57 min   Short Term Goals: Week 1:  PT Short Term Goal 1 (Week 1): Pt will perform supine<>sit with max assist of 1 PT Short Term Goal 2 (Week 1): Pt will perform sit<>stand with +2 max assist PT Short Term Goal 3 (Week 1): Pt will perform bed<>chair transfers with +2 max assist PT Short Term Goal 4 (Week 1): Pt will tolerate sitting OOB in TIS for at least 1 hour  Skilled Therapeutic Interventions/Progress Updates:    Pt supine in bed upon PT arrival, agreeable to therapy tx and denies pain. Pt awake & alert at start of session with eyes open, as session progressed pt with increased fatigue and lethargy. Pt performed rolling in bed in order to don pants, rolling to the R with max assist and facilitation to bring patients L hand to R bedrail to assist with rolling, increased time and cues for visual scanning/turning head to the R with rolling. Pt performed rolling to the L with max +2 assist to finish pulling pants over hips. Pt transferred from supine>sidelying on R to sitting EOB with max assist, pt with initial R lateral lean & posterior lean in sitting but able to come to midline and maintain static sitting balance with CGA-min assist. Pt performed slideboard transfer to the w/c towards the L with max assist +2, pt able to assist with transfer by pulling himself with L UE on w/c armrest. Pt transported to the gym this session in w/c total ssist. Pt performed slideboard transfers this session from w/c<>mat with max assist +2. Session focuses on static sitting balance with and without UE support x 20 minutes working on the following tasks to promote midline orientation and upright sitting posture: leaning to the left to over correct R lean with tactile cue of bringing his L shoulder to touch  therapist's shoulder, resisted leaning to the L in sitting, anterior weightshift with use of physioball, and upright posture with facilitation for thoracic extension. Pt left in TIS w/c at end of session with needs in reach and chair alarm set, goal to sit up for next hour and RN Minette Brine) agreeable to help patient back to bed as needed secondary to fatigue.   Therapy Documentation Precautions:  Precautions Precautions: Fall, Other (comment) Precaution Comments: right hemiparesis, right inattention, right visual field deficit Restrictions Weight Bearing Restrictions: No    Therapy/Group: Individual Therapy  Netta Corrigan, PT, DPT, CSRS 07/14/2019, 7:42 AM

## 2019-07-14 NOTE — Plan of Care (Signed)
  Problem: Consults Goal: RH STROKE PATIENT EDUCATION Description: See Patient Education module for education specifics  Outcome: Progressing   Problem: RH BOWEL ELIMINATION Goal: RH STG MANAGE BOWEL WITH ASSISTANCE Description: STG Manage Bowel with min Assistance. Outcome: Progressing Goal: RH STG MANAGE BOWEL W/MEDICATION W/ASSISTANCE Description: STG Manage Bowel with Medication with min Assistance. Outcome: Progressing   Problem: RH BLADDER ELIMINATION Goal: RH STG MANAGE BLADDER WITH ASSISTANCE Description: STG Manage Bladder With min/mod Assistance Outcome: Progressing   Problem: RH SKIN INTEGRITY Goal: RH STG SKIN FREE OF INFECTION/BREAKDOWN Description: Patients skin will remain free from further infection or breakdown with mod assist. Outcome: Progressing Goal: RH STG MAINTAIN SKIN INTEGRITY WITH ASSISTANCE Description: STG Maintain Skin Integrity With mod Assistance. Outcome: Progressing Goal: RH STG ABLE TO PERFORM INCISION/WOUND CARE W/ASSISTANCE Description: STG Able To Perform Incision/Wound Care With mod Assistance. Outcome: Progressing   Problem: RH SAFETY Goal: RH STG ADHERE TO SAFETY PRECAUTIONS W/ASSISTANCE/DEVICE Description: STG Adhere to Safety Precautions With min Assistance/Device. Outcome: Progressing   Problem: RH COGNITION-NURSING Goal: RH STG USES MEMORY AIDS/STRATEGIES W/ASSIST TO PROBLEM SOLVE Description: STG Uses Memory Aids/Strategies With min Assistance to Problem Solve. Outcome: Progressing   Problem: RH PAIN MANAGEMENT Goal: RH STG PAIN MANAGED AT OR BELOW PT'S PAIN GOAL Description: < 3 Outcome: Progressing   Problem: RH KNOWLEDGE DEFICIT Goal: RH STG INCREASE KNOWLEDGE OF HYPERTENSION Description: Patient and family will verbalize understanding of HTN management including monitoring, diet, exercise, medications, and follow up care with min assist. Outcome: Progressing Goal: RH STG INCREASE KNOWLEGDE OF HYPERLIPIDEMIA Description:  Patient and family will verbalize understanding of HLD management including monitoring, diet, exercise, medications, and follow up care with min assist. Outcome: Progressing Goal: RH STG INCREASE KNOWLEDGE OF STROKE PROPHYLAXIS Description: Patient and family will verbalize understanding of stroke management including monitoring, diet, exercise, medications, and follow up care with min assist. Outcome: Progressing   Problem: RH Vision Goal: RH LTG Vision (Specify) Outcome: Progressing

## 2019-07-14 NOTE — Progress Notes (Signed)
Team Conference Report to Patient/Family  Team Conference discussion was reviewed with the patient's daughter, including goals, any changes in plan of care and target discharge plan to SNF pending medically ready for discharge.  Patient's daughter expressed an understanding and is in agreement with the plan.  The patient is currently total A +2 for bed mobility, max assist for UB/total assist for LB B+D. Trouble with right side neglect and sitting balance. Total A +2 for transfers and standing. Holding oral intake for now and continuing with tube feeds by PEG.  Goals down graded to Mod-Max assist overall. Daughter will contact POA and share information from team conference. Margarito Liner 07/14/2019, 4:39 PM

## 2019-07-14 NOTE — Progress Notes (Signed)
Nutrition Follow-up  RD working remotely.   DOCUMENTATION CODES:   Not applicable  INTERVENTION:  -d/c Osmolite 1.5 -Initiate Jevity 1.5 @ 34m/hr, advance 120mh Q8 until goal rate of 6032mr (run over course of 20 hours; TF can be held for 4 hours due to therapies) -16m61mo-stat via BID -Free water flushes per MD, currently 150mL53m(600mL)78mcommended tube feeding regimen with free water flushes will provide 1,200mL t66mfeeding, 2000 kcals, 106g protein and 1512 ml H2O.   NUTRITION DIAGNOSIS:   Inadequate oral intake related to dysphagia as evidenced by NPO status.  Ongoing.  GOAL:   Patient will meet greater than or equal to 90% of their needs  Met with TF.   MONITOR:   TF tolerance, Skin, Weight trends, Labs, I & O's  REASON FOR ASSESSMENT:   Consult Enteral/tube feeding initiation and management, Assessment of nutrition requirement/status  ASSESSMENT:   Pt with documented PMH. Patient is not able to provide and history.  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  1/9: admitted for acute CVA 1/11:Small bore feeding tube placed by diagnostic radiology, tip of tube in distal duodenum.  1/12: tube feeding initiated 1/14: TF held d/t aspiration pneumonia 1/18: s/p MBS -moderate oropharyngeal dysphagia w/ silent aspiration 1/26: s/p PEG placed in IR  RD unable to reach pt via phone.   Per MD, pt very lethargic/fatigued today.  Discussed pt with RN who reports pt is tolerating his tube feeding well with no residual this morning and appropriate output. Discussed switching to formula with more fiber due to pt's loose stools. RD will advance new TF slowly due to increase in fiber.   Current tube feeding regimen: Osmolite 1.5 Cal @50mL , 150mL fr42mater flush  Q6  Medications reviewed and include: Lomotil, Lasix, SSI  Labs reviewed. CBGs 88-157  UOP: 1,300mL x2437mrs I/O: -7,175mL sinc62mmit  Diet Order:   Diet Order            Diet NPO time specified  Diet effective now              EDUCATION NEEDS:   Not appropriate for education at this time  Skin:  Skin Assessment: Reviewed RN Assessment  Last BM:  2/3 type 6  Height:   Ht Readings from Last 1 Encounters:  06/20/19 5' 9"  (1.753 m)    Weight:   Wt Readings from Last 1 Encounters:  07/14/19 89 kg    Ideal Body Weight:     BMI:  Body mass index is 28.98 kg/m.  Estimated Nutritional Needs:   Kcal:  2000-2200  Protein:  90-105 grams  Fluid:  2L/day   Ata Pecha AveLarkin InaLDN Pager: [phone] Hours Pager: [phone]

## 2019-07-14 NOTE — Progress Notes (Signed)
Occupational Therapy Weekly Progress Note  Patient Details  Name: Levi Pruitt MRN: [id number] Date of Birth: 21-Dec-1945  Beginning of progress report period: July 08, 2019 End of progress report period: July 14, 2019  Today's Date: 07/14/2019 OT Individual Time: 5456-2563 OT Individual Time Calculation (min): 58 min    Patient has met 0 of 5 short term goals.  Mr. Chance continues to make slow progress with OT at this time.  He continues to demonstrate significant RUE and RLE hemiparesis with decreased proprioception and decreased awareness of the right side.  He continues to also demonstrate a significant right visual field deficit with neglect.  UB bathing tasks require mod assist with max instructional cueing in supported sitting.  UB dressing is also at a max assist level from the wheelchair or the EOB.  Completion of washing peri area and buttocks have been performed consistently in the bed with overall total assist.  He is able to roll to the right side with mod assist and max instructional cueing.  He needs total assist for rolling to the left side.  Sidelying to sit requires total assist as well.  In sitting, he is able to maintain static balance with overall min assist when he is alert, however this decreases to max assist if his attention level is compromised secondary to fatigue or sleepiness.  Dynamic sitting balance is at a max assist level.  He currently needs total assist +2 (pt 25%) for sit to stand when pulling garments over his hips or for static standing.  Increased cervical and trunk flexion is noted with inability to achieve full hip and trunk extension without max assist from therapist.  Mr. Wafer demonstrates decreased orientation as well as decreased ability to consistently follow one step commands.  At times for example if he is given the washcloth and told to wipe his mouth, he will instead try to wipe his hand.  Sustained attention at this time is less than 1 minute,  which makes all tasks currently require max instructional cueing to maintain his attention.  Feel based on the severity of his deficits that progress will likely continue at a slow rate.  Recommend continued CIR at this time with expected plan for transfer to SNF.    Patient continues to demonstrate the following deficits: muscle weakness, decreased cardiorespiratoy endurance, impaired timing and sequencing, unbalanced muscle activation and decreased coordination, field cut and hemianopsia, decreased attention to right and right side neglect, decreased initiation, decreased attention, decreased awareness, decreased problem solving, decreased safety awareness, decreased memory and delayed processing and decreased sitting balance, decreased standing balance, decreased postural control, hemiplegia and decreased balance strategies and therefore will continue to benefit from skilled OT intervention to enhance overall performance with BADL.  Patient progressing toward long term goals..  Continue plan of care.  OT Short Term Goals Week 2:  OT Short Term Goal 1 (Week 2): Pt will complete UB bathing in supported sitting with min assist and mod instructional cueing. OT Short Term Goal 2 (Week 2): Pt will complete LB bathing sit to stand with total +2 (pt 40%). OT Short Term Goal 3 (Week 2): Pt will locate 2 grooming or bathing items right of midline with no more than mod instructional cueing. OT Short Term Goal 4 (Week 2): Pt will complete squat pivot transfer to the drop arm commode with max assist. OT Short Term Goal 5 (Week 2): Pt will donn a pullover shirt with mod assist in supported sitting.  Skilled Therapeutic Interventions/Progress  Updates:    Pt worked on sit to stand during session with total assist +2 (pt 25%).  He was able to maintain standing for 3 intervals of 45 seconds to 1 min 30 seconds.  Decreased trunk, hip, and cervical extension noted with max facilitation needed at all of these areas to  maintain upright posture.  Max facilitation also needed at the right knee secondary to decreased motor activity.  He was able to initiate some active participation in the initial part of sit to stand with the LUE and LUE but then he was unable to maintain it into hip and trunk extension.  Oxygen sats decreased to 85% on room air post standing with 2 Ls supplementation given to increase back to 92%.  Had pt work on visual scanning from the wheelchair as well for second part of session with use of the [employer].  Lights in the bottom left corner to midline were the only ones utilized.  He needed max instructional and mod demonstrational cueing for head turns to the right to locate the lights just at midline or slightly left.  He averaged 10 seconds for each interval.  Finished session with squat pivot transfer with total assist with transition to supine with total +2 (pt 25%).  Pt's daughter present, so provided education on PROM exercises for the RUE to be completed with emphasis on trying to have pt maintain visual attention and alertness when completing them.  Also discussed resting hand splint and recommendations for night time wear.  Soft touch call button in reach.    Therapy Documentation Precautions:  Precautions Precautions: Fall Precaution Comments: right hemiparesis, right inattention, right visual field deficit Restrictions Weight Bearing Restrictions: No   Pain: Pain Assessment Pain Scale: Faces Pain Score: 0-No pain ADL: See Care Tool Section for some details of mobility and selfcare  Therapy/Group: Individual Therapy  Milton Streicher OTR/L 07/14/2019, 12:16 PM

## 2019-07-14 NOTE — Progress Notes (Signed)
[place] PHYSICAL MEDICINE & REHABILITATION PROGRESS NOTE   Subjective/Complaints:  A little more alert in therapy.  No abd pain, appreciate cardiology notes  ROS- unable to obtain due to cognition   Objective:   No results found. Recent Labs    07/12/19 0558  WBC 12.9*  HGB 14.6  HCT 45.9  PLT 277   Recent Labs    07/12/19 0558  NA 141  K 4.3  CL 101  CO2 29  GLUCOSE 128*  BUN 22  CREATININE 1.05  CALCIUM 8.6*    Intake/Output Summary (Last 24 hours) at 07/14/2019 1042 Last data filed at 07/14/2019 0740 Gross per 24 hour  Intake 0 ml  Output 1300 ml  Net -1300 ml     Physical Exam: Vital Signs Blood pressure 109/72, pulse 63, temperature 98.1 F (36.7 C), temperature source Axillary, resp. rate 16, weight 89 kg, SpO2 96 %. Nursing note and vitals reviewed. Constitutional: He appears well-developed and well-nourished. No distress. Nasal cannula in place. Lethargic.  HENT:  Head: Normocephalic and atraumatic.  Eyes: Conjunctivae are normal. Right eye exhibits no discharge. Left eye exhibits no discharge.  Left gaze preference  Neck: No tracheal deviation present. No thyromegaly present.  Cardiovascular: Normal rate.  Respiratory: Belly breathing. No stridor. No respiratory distress.  [place] off  GI: Soft. He exhibits no distension. There is no abdominal tenderness.  PEG site with dry dressing.   Musculoskeletal:        General: Edema present.     Comments: Mild edema RUE/RLE.   Neurological: He is alert.  Oriented to self only.  Dysarthria and language of confusion noted.  Motor: Limited due to participation, however dense right hemiparesis noted LUE/LLE:?  3+-4 -/5 proximal distal.  Short answers Skin: Skin is warm and dry. He is not diaphoretic.   Psychiatric:  Flat affect and appears disengaged.    Assessment/Plan: 1. Functional deficits secondary to Left PCA infarct  which require 3+ hours per day of interdisciplinary therapy in a comprehensive  inpatient rehab setting.  Physiatrist is providing close team supervision and 24 hour management of active medical problems listed below.  Physiatrist and rehab team continue to assess barriers to discharge/monitor patient progress toward functional and medical goals  Care Tool:  Bathing    Body parts bathed by patient: Face   Body parts bathed by helper: Buttocks, Front perineal area Body parts n/a: Right upper leg, Left upper leg, Right lower leg, Left lower leg, Right arm, Left arm, Chest, Abdomen(did not attempt this session)   Bathing assist Assist Level: 2 Helpers     Upper Body Dressing/Undressing Upper body dressing   What is the patient wearing?: Button up shirt    Upper body assist Assist Level: Maximal Assistance - Patient 25 - 49%    Lower Body Dressing/Undressing Lower body dressing      What is the patient wearing?: Incontinence brief, Pants     Lower body assist Assist for lower body dressing: 2 Helpers     Toileting Toileting    Toileting assist Assist for toileting: Total Assistance - Patient < 25%     Transfers Chair/bed transfer  Transfers assist  Chair/bed transfer activity did not occur: Safety/medical concerns  Chair/bed transfer assist level: 2 Helpers     Locomotion Ambulation   Ambulation assist   Ambulation activity did not occur: Safety/medical concerns          Walk 10 feet activity   Assist  Walk 10 feet activity  did not occur: Safety/medical concerns        Walk 50 feet activity   Assist Walk 50 feet with 2 turns activity did not occur: Safety/medical concerns         Walk 150 feet activity   Assist Walk 150 feet activity did not occur: Safety/medical concerns         Walk 10 feet on uneven surface  activity   Assist Walk 10 feet on uneven surfaces activity did not occur: Safety/medical concerns         Wheelchair     Assist Will patient use wheelchair at discharge?: Yes(TBD) Type of  Wheelchair: Manual(LT goals set for discharge )           Wheelchair 50 feet with 2 turns activity    Assist    Wheelchair 50 feet with 2 turns activity did not occur: Safety/medical concerns       Wheelchair 150 feet activity     Assist  Wheelchair 150 feet activity did not occur: Safety/medical concerns       Blood pressure 109/72, pulse 63, temperature 98.1 F (36.7 C), temperature source Axillary, resp. rate 16, weight 89 kg, SpO2 96 %.  Medical Problem List and Plan: 1.  Dense right hemiplegia with right inattention and poor postural reflexes, decreased safety awareness with delayed processing, difficulty sequencing, oral apraxia, dysarthria with language of confusion and cognitive deficits secondary to L PCA CVA and chronic R temporal and occipital lobe infarcts with small acute L frontal lobe infarct                         CIR PT OT  SLP- slow progress, cardiac issues impacting endurance and ability to participate in therapy - chage to 15/7 d/w OT  Team conference today please see physician documentation under team conference tab, met with team  to discuss problems,progress, and goals. Formulized individual treatment plan based on medical history, underlying problem and comorbidities. CHF is contributing to lethargy and fatigue in the setting of CVA  2.  Antithrombotics: -DVT/anticoagulation:  Pharmaceutical: Heparin doppler neg , no renal failure will switch to Lovenox to limit injections              -antiplatelet therapy: DAPT X 3 months followed by ASA alone.   3. Pain Management:  Tylenol prn.  4. Mood: LCSW to follow for evaluation and support when appropriate.              -antipsychotic agents: N/A 5. Neuropsych: This patient is not capable of making decisions on his own behalf. 6. Skin/Wound Care: Routine pressure relief measures. Needs air mattress as unable to position himself.  Freq stools with peri anal erythema , no open areas , moisture barrier ,  try to keep dry ,7. Fluids/Electrolytes/Nutrition: Monitor I/O.   8. Aspiration PNA: Leucocytosis resolving. Completed 2 week course of Rocephin 1/22 and Zithromax X 5 days. Oral candida treated with diflucan X & days.Remains afebrile CXR lagging clinical picture  9. Severe post stroke dysphagia: PEG placed on 01/26--Tube feeds being slowly advanced every 4 hours to prevent refeeding syndrome.              Advance diet as tolerated 10 Malnutrition: At risk for refeeding syndrome--need to check  Mg             Phos and K daily X 2 11. Acute on chronic CHF: Monitor for signs and symptoms of fluid overload.  Blood pressures remain soft--SBP 90's-110.               Daily weights- Strict I/O has foley now x 3 d, will cont unless cardiology feels we no longer need it, pt is incont  12. Inferior STEMI: On Lipitor, Coreg BID 13. Urinary retention: Has not voided for since > 18 hours. Needs PVRs as well as caths for volumes > 350cc.              UA/urine culture ordered 14. Hypoxia: Continues to drop to 80's requires deep suctioning and 2 liters per [place].  Discontinue IVF.              Chest x-ray ordered as showing signs of fluid overload Foley inserted for strict I/Os and to guide further Lasix dosing.  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   17, Diarrhea  Will order Lomotil scheduled  Since pt has received it only once 18. Insomnia: will start melatonin 48m at night. Patient was up all last night and thus slept during day and missed therapies. Wife agreeable to melatonin to help regulate sleep cycle. She does not want other sleep agents.   LOS: 7 days A FACE TO FDecherdE Jabari Swoveland 07/14/2019, 10:42 AM

## 2019-07-15 ENCOUNTER — Inpatient Hospital Stay (HOSPITAL_COMMUNITY): Payer: Medicare Other | Admitting: Physical Therapy

## 2019-07-15 ENCOUNTER — Inpatient Hospital Stay (HOSPITAL_COMMUNITY): Payer: Medicare Other

## 2019-07-15 ENCOUNTER — Inpatient Hospital Stay (HOSPITAL_COMMUNITY): Payer: Medicare Other | Admitting: Occupational Therapy

## 2019-07-15 ENCOUNTER — Inpatient Hospital Stay (HOSPITAL_COMMUNITY): Payer: Medicare Other | Admitting: Speech Pathology

## 2019-07-15 ENCOUNTER — Encounter (HOSPITAL_COMMUNITY): Payer: Self-pay | Admitting: Internal Medicine

## 2019-07-15 ENCOUNTER — Inpatient Hospital Stay (HOSPITAL_COMMUNITY)
Admission: AD | Admit: 2019-07-15 | Discharge: 2019-07-21 | DRG: 871 | Disposition: A | Discharge status: Rehabilitation Facility | Payer: Medicare Other | Source: Ambulatory Visit | Attending: Internal Medicine | Admitting: Internal Medicine

## 2019-07-15 DIAGNOSIS — I69320 Aphasia following cerebral infarction: Secondary | ICD-10-CM | POA: Diagnosis not present

## 2019-07-15 DIAGNOSIS — Z7982 Long term (current) use of aspirin: Secondary | ICD-10-CM | POA: Diagnosis not present

## 2019-07-15 DIAGNOSIS — I4891 Unspecified atrial fibrillation: Secondary | ICD-10-CM

## 2019-07-15 DIAGNOSIS — J690 Pneumonitis due to inhalation of food and vomit: Secondary | ICD-10-CM | POA: Diagnosis present

## 2019-07-15 DIAGNOSIS — E669 Obesity, unspecified: Secondary | ICD-10-CM | POA: Diagnosis present

## 2019-07-15 DIAGNOSIS — J9601 Acute respiratory failure with hypoxia: Secondary | ICD-10-CM | POA: Diagnosis present

## 2019-07-15 DIAGNOSIS — I63432 Cerebral infarction due to embolism of left posterior cerebral artery: Secondary | ICD-10-CM | POA: Diagnosis not present

## 2019-07-15 DIAGNOSIS — Z931 Gastrostomy status: Secondary | ICD-10-CM | POA: Diagnosis not present

## 2019-07-15 DIAGNOSIS — I252 Old myocardial infarction: Secondary | ICD-10-CM

## 2019-07-15 DIAGNOSIS — E874 Mixed disorder of acid-base balance: Secondary | ICD-10-CM | POA: Diagnosis not present

## 2019-07-15 DIAGNOSIS — D490 Neoplasm of unspecified behavior of digestive system: Secondary | ICD-10-CM | POA: Diagnosis present

## 2019-07-15 DIAGNOSIS — R609 Edema, unspecified: Secondary | ICD-10-CM | POA: Diagnosis not present

## 2019-07-15 DIAGNOSIS — A419 Sepsis, unspecified organism: Secondary | ICD-10-CM | POA: Diagnosis present

## 2019-07-15 DIAGNOSIS — Z23 Encounter for immunization: Secondary | ICD-10-CM | POA: Diagnosis not present

## 2019-07-15 DIAGNOSIS — I482 Chronic atrial fibrillation, unspecified: Secondary | ICD-10-CM | POA: Diagnosis present

## 2019-07-15 DIAGNOSIS — Z9189 Other specified personal risk factors, not elsewhere classified: Secondary | ICD-10-CM

## 2019-07-15 DIAGNOSIS — I130 Hypertensive heart and chronic kidney disease with heart failure and stage 1 through stage 4 chronic kidney disease, or unspecified chronic kidney disease: Secondary | ICD-10-CM | POA: Diagnosis present

## 2019-07-15 DIAGNOSIS — R945 Abnormal results of liver function studies: Secondary | ICD-10-CM | POA: Diagnosis present

## 2019-07-15 DIAGNOSIS — R233 Spontaneous ecchymoses: Secondary | ICD-10-CM | POA: Diagnosis present

## 2019-07-15 DIAGNOSIS — R739 Hyperglycemia, unspecified: Secondary | ICD-10-CM | POA: Diagnosis present

## 2019-07-15 DIAGNOSIS — I63439 Cerebral infarction due to embolism of unspecified posterior cerebral artery: Secondary | ICD-10-CM | POA: Diagnosis not present

## 2019-07-15 DIAGNOSIS — Z8249 Family history of ischemic heart disease and other diseases of the circulatory system: Secondary | ICD-10-CM

## 2019-07-15 DIAGNOSIS — I428 Other cardiomyopathies: Secondary | ICD-10-CM | POA: Diagnosis present

## 2019-07-15 DIAGNOSIS — E876 Hypokalemia: Secondary | ICD-10-CM | POA: Diagnosis not present

## 2019-07-15 DIAGNOSIS — I69322 Dysarthria following cerebral infarction: Secondary | ICD-10-CM | POA: Diagnosis not present

## 2019-07-15 DIAGNOSIS — Z515 Encounter for palliative care: Secondary | ICD-10-CM

## 2019-07-15 DIAGNOSIS — G9341 Metabolic encephalopathy: Secondary | ICD-10-CM | POA: Diagnosis present

## 2019-07-15 DIAGNOSIS — M7989 Other specified soft tissue disorders: Secondary | ICD-10-CM | POA: Diagnosis present

## 2019-07-15 DIAGNOSIS — I69392 Facial weakness following cerebral infarction: Secondary | ICD-10-CM | POA: Diagnosis not present

## 2019-07-15 DIAGNOSIS — Z20822 Contact with and (suspected) exposure to covid-19: Secondary | ICD-10-CM | POA: Diagnosis present

## 2019-07-15 DIAGNOSIS — I69319 Unspecified symptoms and signs involving cognitive functions following cerebral infarction: Secondary | ICD-10-CM | POA: Diagnosis not present

## 2019-07-15 DIAGNOSIS — Z7902 Long term (current) use of antithrombotics/antiplatelets: Secondary | ICD-10-CM

## 2019-07-15 DIAGNOSIS — I5043 Acute on chronic combined systolic (congestive) and diastolic (congestive) heart failure: Secondary | ICD-10-CM | POA: Diagnosis present

## 2019-07-15 DIAGNOSIS — R471 Dysarthria and anarthria: Secondary | ICD-10-CM | POA: Diagnosis present

## 2019-07-15 DIAGNOSIS — Z794 Long term (current) use of insulin: Secondary | ICD-10-CM

## 2019-07-15 DIAGNOSIS — R0602 Shortness of breath: Secondary | ICD-10-CM

## 2019-07-15 DIAGNOSIS — I69398 Other sequelae of cerebral infarction: Secondary | ICD-10-CM

## 2019-07-15 DIAGNOSIS — I63532 Cerebral infarction due to unspecified occlusion or stenosis of left posterior cerebral artery: Secondary | ICD-10-CM | POA: Diagnosis not present

## 2019-07-15 DIAGNOSIS — R1310 Dysphagia, unspecified: Secondary | ICD-10-CM | POA: Diagnosis not present

## 2019-07-15 DIAGNOSIS — J9602 Acute respiratory failure with hypercapnia: Secondary | ICD-10-CM | POA: Diagnosis present

## 2019-07-15 DIAGNOSIS — E440 Moderate protein-calorie malnutrition: Secondary | ICD-10-CM | POA: Diagnosis present

## 2019-07-15 DIAGNOSIS — Z79899 Other long term (current) drug therapy: Secondary | ICD-10-CM | POA: Diagnosis not present

## 2019-07-15 DIAGNOSIS — R627 Adult failure to thrive: Secondary | ICD-10-CM

## 2019-07-15 DIAGNOSIS — I69391 Dysphagia following cerebral infarction: Secondary | ICD-10-CM

## 2019-07-15 DIAGNOSIS — I480 Paroxysmal atrial fibrillation: Secondary | ICD-10-CM | POA: Diagnosis present

## 2019-07-15 DIAGNOSIS — Z9981 Dependence on supplemental oxygen: Secondary | ICD-10-CM

## 2019-07-15 DIAGNOSIS — I255 Ischemic cardiomyopathy: Secondary | ICD-10-CM | POA: Diagnosis present

## 2019-07-15 DIAGNOSIS — I69351 Hemiplegia and hemiparesis following cerebral infarction affecting right dominant side: Secondary | ICD-10-CM

## 2019-07-15 DIAGNOSIS — I6523 Occlusion and stenosis of bilateral carotid arteries: Secondary | ICD-10-CM | POA: Diagnosis present

## 2019-07-15 DIAGNOSIS — I214 Non-ST elevation (NSTEMI) myocardial infarction: Secondary | ICD-10-CM | POA: Diagnosis present

## 2019-07-15 DIAGNOSIS — R4701 Aphasia: Secondary | ICD-10-CM | POA: Diagnosis present

## 2019-07-15 DIAGNOSIS — N179 Acute kidney failure, unspecified: Secondary | ICD-10-CM | POA: Diagnosis present

## 2019-07-15 DIAGNOSIS — I2510 Atherosclerotic heart disease of native coronary artery without angina pectoris: Secondary | ICD-10-CM | POA: Diagnosis present

## 2019-07-15 DIAGNOSIS — R7401 Elevation of levels of liver transaminase levels: Secondary | ICD-10-CM | POA: Diagnosis present

## 2019-07-15 DIAGNOSIS — E870 Hyperosmolality and hypernatremia: Secondary | ICD-10-CM | POA: Diagnosis not present

## 2019-07-15 DIAGNOSIS — I519 Heart disease, unspecified: Secondary | ICD-10-CM | POA: Diagnosis not present

## 2019-07-15 DIAGNOSIS — I69390 Apraxia following cerebral infarction: Secondary | ICD-10-CM | POA: Diagnosis not present

## 2019-07-15 DIAGNOSIS — I509 Heart failure, unspecified: Secondary | ICD-10-CM

## 2019-07-15 DIAGNOSIS — I5021 Acute systolic (congestive) heart failure: Secondary | ICD-10-CM | POA: Diagnosis not present

## 2019-07-15 DIAGNOSIS — Z6830 Body mass index (BMI) 30.0-30.9, adult: Secondary | ICD-10-CM | POA: Diagnosis not present

## 2019-07-15 DIAGNOSIS — R482 Apraxia: Secondary | ICD-10-CM | POA: Diagnosis present

## 2019-07-15 DIAGNOSIS — N1831 Chronic kidney disease, stage 3a: Secondary | ICD-10-CM | POA: Diagnosis present

## 2019-07-15 DIAGNOSIS — Z7189 Other specified counseling: Secondary | ICD-10-CM | POA: Diagnosis not present

## 2019-07-15 DIAGNOSIS — Z6831 Body mass index (BMI) 31.0-31.9, adult: Secondary | ICD-10-CM | POA: Diagnosis not present

## 2019-07-15 LAB — HEMOGLOBIN A1C
Hgb A1c MFr Bld: 5.7 % — ABNORMAL HIGH (ref 4.8–5.6)
Mean Plasma Glucose: 116.89 mg/dL

## 2019-07-15 LAB — BRAIN NATRIURETIC PEPTIDE: B Natriuretic Peptide: 893.6 pg/mL — ABNORMAL HIGH (ref 0.0–100.0)

## 2019-07-15 LAB — BLOOD GAS, ARTERIAL
Acid-Base Excess: 8.6 mmol/L — ABNORMAL HIGH (ref 0.0–2.0)
Bicarbonate: 35 mmol/L — ABNORMAL HIGH (ref 20.0–28.0)
FIO2: 100
O2 Saturation: 96.6 %
Patient temperature: 36.4
pCO2 arterial: 72.2 mmHg (ref 32.0–48.0)
pH, Arterial: 7.303 — ABNORMAL LOW (ref 7.350–7.450)
pO2, Arterial: 105 mmHg (ref 83.0–108.0)

## 2019-07-15 LAB — COMPREHENSIVE METABOLIC PANEL
ALT: 154 U/L — ABNORMAL HIGH (ref 0–44)
AST: 108 U/L — ABNORMAL HIGH (ref 15–41)
Albumin: 2.7 g/dL — ABNORMAL LOW (ref 3.5–5.0)
Alkaline Phosphatase: 129 U/L — ABNORMAL HIGH (ref 38–126)
Anion gap: 11 (ref 5–15)
BUN: 36 mg/dL — ABNORMAL HIGH (ref 8–23)
CO2: 37 mmol/L — ABNORMAL HIGH (ref 22–32)
Calcium: 8.7 mg/dL — ABNORMAL LOW (ref 8.9–10.3)
Chloride: 95 mmol/L — ABNORMAL LOW (ref 98–111)
Creatinine, Ser: 1.39 mg/dL — ABNORMAL HIGH (ref 0.61–1.24)
GFR calc Af Amer: 58 mL/min — ABNORMAL LOW (ref >60)
GFR calc non Af Amer: 50 mL/min — ABNORMAL LOW (ref >60)
Glucose, Bld: 80 mg/dL (ref 70–99)
Potassium: 5.3 mmol/L — ABNORMAL HIGH (ref 3.5–5.1)
Sodium: 143 mmol/L (ref 135–145)
Total Bilirubin: 1.2 mg/dL (ref 0.3–1.2)
Total Protein: 6.5 g/dL (ref 6.5–8.1)

## 2019-07-15 LAB — GLUCOSE, CAPILLARY
Glucose-Capillary: 151 mg/dL — ABNORMAL HIGH (ref 70–99)
Glucose-Capillary: 83 mg/dL (ref 70–99)
Glucose-Capillary: 75 mg/dL (ref 70–99)
Glucose-Capillary: 81 mg/dL (ref 70–99)
Glucose-Capillary: 87 mg/dL (ref 70–99)

## 2019-07-15 LAB — CBC WITH DIFFERENTIAL/PLATELET
Abs Immature Granulocytes: 0.12 10*3/uL — ABNORMAL HIGH (ref 0.00–0.07)
Basophils Absolute: 0.1 10*3/uL (ref 0.0–0.1)
Basophils Relative: 1 %
Eosinophils Absolute: 0.2 10*3/uL (ref 0.0–0.5)
Eosinophils Relative: 1 %
HCT: 48.2 % (ref 39.0–52.0)
Hemoglobin: 15.6 g/dL (ref 13.0–17.0)
Immature Granulocytes: 1 %
Lymphocytes Relative: 12 %
Lymphs Abs: 1.5 10*3/uL (ref 0.7–4.0)
MCH: 31.1 pg (ref 26.0–34.0)
MCHC: 32.4 g/dL (ref 30.0–36.0)
MCV: 96.2 fL (ref 80.0–100.0)
Monocytes Absolute: 1.3 10*3/uL — ABNORMAL HIGH (ref 0.1–1.0)
Monocytes Relative: 10 %
Neutro Abs: 9.7 10*3/uL — ABNORMAL HIGH (ref 1.7–7.7)
Neutrophils Relative %: 75 %
Platelets: 238 10*3/uL (ref 150–400)
RBC: 5.01 MIL/uL (ref 4.22–5.81)
RDW: 13.9 % (ref 11.5–15.5)
WBC: 12.9 10*3/uL — ABNORMAL HIGH (ref 4.0–10.5)
nRBC: 0 % (ref 0.0–0.2)

## 2019-07-15 LAB — PROCALCITONIN: Procalcitonin: 0.17 ng/mL

## 2019-07-15 LAB — SARS CORONAVIRUS 2 (TAT 6-24 HRS): SARS Coronavirus 2: NEGATIVE

## 2019-07-15 LAB — TROPONIN I (HIGH SENSITIVITY)
Troponin I (High Sensitivity): 545 ng/L (ref <18)
Troponin I (High Sensitivity): 493 ng/L (ref <18)

## 2019-07-15 LAB — LACTIC ACID, PLASMA
Lactic Acid, Venous: 1.2 mmol/L (ref 0.5–1.9)
Lactic Acid, Venous: 1.5 mmol/L (ref 0.5–1.9)

## 2019-07-15 LAB — D-DIMER, QUANTITATIVE: D-Dimer, Quant: 3.49 ug/mL-FEU — ABNORMAL HIGH (ref 0.00–0.50)

## 2019-07-15 MED ORDER — METHYLPHENIDATE HCL 5 MG PO TABS: 5.0000 mg | ORAL_TABLET | Freq: Two times a day (BID) | ORAL | 0 refills | Status: DC

## 2019-07-15 MED ORDER — PIPERACILLIN-TAZOBACTAM 3.375 G IVPB 30 MIN
3.3750 g | Freq: Once | INTRAVENOUS | Status: AC
  Administered 2019-07-15: 3.375 g via INTRAVENOUS
  Filled 2019-07-15 (×2): qty 50

## 2019-07-15 MED ORDER — FUROSEMIDE 10 MG/ML IJ SOLN
40.0000 mg | Freq: Once | INTRAMUSCULAR | Status: AC
  Administered 2019-07-15: 40 mg via INTRAVENOUS
  Filled 2019-07-15: qty 4

## 2019-07-15 MED ORDER — ONDANSETRON HCL 4 MG/2ML IJ SOLN: 4.0000 mg | Freq: Four times a day (QID) | INTRAMUSCULAR | Status: DC | PRN

## 2019-07-15 MED ORDER — METHYLPHENIDATE HCL 5 MG PO TABS: 5.0000 mg | ORAL_TABLET | Freq: Two times a day (BID) | ORAL | Status: DC

## 2019-07-15 MED ORDER — MELATONIN 3 MG PO TABS: 6.0000 mg | ORAL_TABLET | Freq: Every evening | ORAL | 0 refills | Status: DC | PRN

## 2019-07-15 MED ORDER — INSULIN ASPART 100 UNIT/ML ~~LOC~~ SOLN
0.0000 [IU] | SUBCUTANEOUS | Status: DC
  Administered 2019-07-15 – 2019-07-17 (×2): 2 [IU] via SUBCUTANEOUS
  Administered 2019-07-17: 1 [IU] via SUBCUTANEOUS
  Administered 2019-07-17 (×3): 2 [IU] via SUBCUTANEOUS
  Administered 2019-07-18 (×3): 1 [IU] via SUBCUTANEOUS
  Administered 2019-07-18: 2 [IU] via SUBCUTANEOUS
  Administered 2019-07-18 – 2019-07-21 (×6): 1 [IU] via SUBCUTANEOUS

## 2019-07-15 MED ORDER — JEVITY 1.5 CAL/FIBER PO LIQD
1000.0000 mL | ORAL | Status: DC
  Filled 2019-07-15 (×3): qty 1000

## 2019-07-15 MED ORDER — ENOXAPARIN SODIUM 40 MG/0.4ML ~~LOC~~ SOLN: 40.0000 mg | SUBCUTANEOUS | Status: DC

## 2019-07-15 MED ORDER — ENOXAPARIN SODIUM 40 MG/0.4ML ~~LOC~~ SOLN
40.0000 mg | SUBCUTANEOUS | Status: DC
  Administered 2019-07-15 – 2019-07-21 (×7): 40 mg via SUBCUTANEOUS
  Filled 2019-07-15 (×7): qty 0.4

## 2019-07-15 MED ORDER — CARVEDILOL 3.125 MG PO TABS
3.1250 mg | ORAL_TABLET | Freq: Two times a day (BID) | ORAL | Status: DC
  Administered 2019-07-15 (×2): 3.125 mg
  Filled 2019-07-15 (×2): qty 1

## 2019-07-15 MED ORDER — FREE WATER
150.0000 mL | Freq: Four times a day (QID) | Status: DC
  Administered 2019-07-15 – 2019-07-17 (×9): 150 mL

## 2019-07-15 MED ORDER — ATORVASTATIN CALCIUM 80 MG PO TABS
80.0000 mg | ORAL_TABLET | Freq: Every day | ORAL | Status: DC
  Administered 2019-07-15 – 2019-07-20 (×6): 80 mg
  Filled 2019-07-15 (×6): qty 1

## 2019-07-15 MED ORDER — VANCOMYCIN HCL 1750 MG/350ML IV SOLN: 1750.0000 mg | INTRAVENOUS | Status: DC

## 2019-07-15 MED ORDER — IPRATROPIUM-ALBUTEROL 0.5-2.5 (3) MG/3ML IN SOLN: 3.0000 mL | Freq: Four times a day (QID) | RESPIRATORY_TRACT | Status: DC | PRN

## 2019-07-15 MED ORDER — MELATONIN 3 MG PO TABS
6.0000 mg | ORAL_TABLET | Freq: Every evening | ORAL | Status: DC | PRN
  Administered 2019-07-17 – 2019-07-20 (×2): 6 mg via ORAL
  Filled 2019-07-15 (×4): qty 2

## 2019-07-15 MED ORDER — ACETAMINOPHEN 325 MG PO TABS
650.0000 mg | ORAL_TABLET | Freq: Four times a day (QID) | ORAL | Status: DC | PRN
  Administered 2019-07-18 – 2019-07-20 (×2): 650 mg via ORAL
  Filled 2019-07-15 (×2): qty 2

## 2019-07-15 MED ORDER — VANCOMYCIN HCL 1500 MG/300ML IV SOLN
1500.0000 mg | INTRAVENOUS | Status: DC
  Administered 2019-07-16: 1500 mg via INTRAVENOUS
  Filled 2019-07-15: qty 300

## 2019-07-15 MED ORDER — ACETAMINOPHEN 650 MG RE SUPP: 650.0000 mg | Freq: Four times a day (QID) | RECTAL | Status: DC | PRN

## 2019-07-15 MED ORDER — CLOPIDOGREL BISULFATE 75 MG PO TABS
75.0000 mg | ORAL_TABLET | Freq: Every day | ORAL | Status: DC
  Administered 2019-07-15 – 2019-07-21 (×7): 75 mg
  Filled 2019-07-15 (×7): qty 1

## 2019-07-15 MED ORDER — FUROSEMIDE 10 MG/ML IJ SOLN
40.0000 mg | Freq: Once | INTRAMUSCULAR | Status: AC
  Administered 2019-07-15: 02:00:00 40 mg via INTRAVENOUS

## 2019-07-15 MED ORDER — FUROSEMIDE 10 MG/ML IJ SOLN
INTRAMUSCULAR | Status: AC
  Filled 2019-07-15: qty 4

## 2019-07-15 MED ORDER — ASPIRIN 81 MG PO CHEW
324.0000 mg | CHEWABLE_TABLET | Freq: Every day | ORAL | Status: DC
  Administered 2019-07-15 – 2019-07-21 (×7): 324 mg
  Filled 2019-07-15 (×7): qty 4

## 2019-07-15 MED ORDER — FUROSEMIDE 40 MG PO TABS: 40.0000 mg | ORAL_TABLET | Freq: Every day | ORAL | Status: DC

## 2019-07-15 MED ORDER — VANCOMYCIN HCL 1750 MG/350ML IV SOLN
1750.0000 mg | Freq: Once | INTRAVENOUS | Status: AC
  Administered 2019-07-15: 1750 mg via INTRAVENOUS
  Filled 2019-07-15: qty 350

## 2019-07-15 MED ORDER — PIPERACILLIN-TAZOBACTAM 3.375 G IVPB
3.3750 g | Freq: Three times a day (TID) | INTRAVENOUS | Status: DC
  Administered 2019-07-15 – 2019-07-17 (×6): 3.375 g via INTRAVENOUS
  Filled 2019-07-15 (×5): qty 50

## 2019-07-15 MED ORDER — IPRATROPIUM-ALBUTEROL 0.5-2.5 (3) MG/3ML IN SOLN
3.0000 mL | Freq: Four times a day (QID) | RESPIRATORY_TRACT | Status: DC | PRN
  Administered 2019-07-16 – 2019-07-19 (×2): 3 mL via RESPIRATORY_TRACT
  Filled 2019-07-15 (×2): qty 3

## 2019-07-15 MED ORDER — JEVITY 1.5 CAL/FIBER PO LIQD: 1000.0000 mL | ORAL | Status: DC

## 2019-07-15 MED ORDER — FREE WATER: 150.0000 mL | Freq: Four times a day (QID) | Status: DC

## 2019-07-15 MED ORDER — INSULIN ASPART 100 UNIT/ML ~~LOC~~ SOLN: 0.0000 [IU] | SUBCUTANEOUS | 11 refills | Status: DC

## 2019-07-15 MED ORDER — ONDANSETRON HCL 4 MG PO TABS: 4.0000 mg | ORAL_TABLET | Freq: Four times a day (QID) | ORAL | Status: DC | PRN

## 2019-07-15 NOTE — Progress Notes (Signed)
Inpatient Rehab Admissions Coordinator:   Note pt transferred from CIR to acute this morning for respiratory distress.  Will follow from a distance for plan/goals of care.   Shann Medal, PT, DPT Admissions Coordinator [phone] 07/15/19  11:38 AM

## 2019-07-15 NOTE — Plan of Care (Signed)
Per admitting physician H&P:  Levi Pruitt is a 74 y.o. male with history of recent CVA with right-sided hemiparesis dysphagia requiring PEG tube placement at that time patient also had a inferior wall MI treated medically presently on aspirin statins and Coreg was discharged to rehab on 27 January 7 days ago and about 3 days ago cardiology was reconsulted because patient was getting more short of breath and at that time Lasix was restarted.  This midnight patient became more short of breath chest x-ray was showing worsening infiltrates and patient was given Lasix 40 mg IV and hospitalist was consulted.  On exam patient is short of breath requiring 100% nonrebreather.  Following commands but appears confused.  Is not febrile blood pressures are 120/60 pulse is around 60/min.  Given the worsening with respiratory status patient transferred back to hospital section.  Patient seen and examined this morning.  He remains very confused.  Currently on O2 Ventimask.  Very high suspicion for ongoing aspiration.  Chest x-ray showing evidence of pneumonia.  Initiated on antibiotics.  Currently on IV vancomycin, Zosyn.  Discussed with his healthcare power of attorney Bonita Quin and explained to her the patient's situation.  Answered all of her questions appropriately to the best of my knowledge.  Consulted palliative care after the healthcare power of attorney agreed.  At this time she does not wish for the patient to be intubated.  She will discuss with the patient's daughter and family.  Palliative care on board talking to family.  He is very high risk for worsening or decompensation.

## 2019-07-15 NOTE — Progress Notes (Signed)
Patient ID: Levi Pruitt, male   DOB: 1945-11-24, 74 y.o.   MRN: [id number]  This NP visited patient at the bedside as a follow up for  Palliative  Medicine  needs and emotional support.    Received call from Dr. Barth Kirks with concerns for the patient secondary to a declining event last night.  Currently the patient became short of breath, chest x-ray showed worsening infiltrates and patient was transferred from CIR to [facility] floor.  Today patient remains confused, he is currently on a Ventimask for oxygen delivery.  He has been started on broad-spectrum antibiotics for suspicion of ongoing aspiration and secondary pneumonias.  I spoke with  SO/[name] Dorsett/[name], for continued conversation regarding current medical situation.  Again we discussed the patient's ongoing high risk for decompensation secondary to his multiple comorbidities.  I verbalized that this was my concern for [name] initially after his significant stroke, understanding likely  trajectories for patients when they fail to thrive.  We discussed the difference between an aggressive medical intervention path and a palliative comfort path.  We discussed the concept of quality over quantity of life.  I again raised awareness to the patient's documented declaration of a desire for a natural death   I continue to share my concern for long term poor prognosis and meaningful recovery.   [name] understands the seriousness of the current medical situation and the likelihood for meaningful recovery, however she also is trying to balance the feelings and wishes of [name] biological family.  [name] is the documented healthcare power of attorney but she is not legally married to [name].  She is diligent in including [name]'s family and decisions, but ultimately wants to honor his wishes  Plan of Care: -Limited code -no intubation - open to all offered and available medical interventions to prolong life.  -Continue with  current treatment plan through the weekend, discussed treatment plan again at the beginning of the week depending on outcomes  Discussed with [name] the importance of continued conversation with family and the  medical providers regarding overall plan of care and treatment options,  ensuring decisions are within the context of the patients values and GOCs.   Education and emotional support offered  Questions and concerns addressed     This nurse practitioner informed  the family  that I will be out of the hospital until Monday morning.  If the patient is still hospitalized I will follow-up at that time.  Call palliative medicine team phone # [phone] with questions or concerns.  Total time spent on the unit was 45  minutes  Greater than 50% of the time was spent in counseling and coordination of care  [name] NP  Palliative Medicine Team Team Phone # [phone] Pager [phone]

## 2019-07-15 NOTE — H&P (Signed)
History and Physical    Levi Pruitt [id number] DOB: 1945/11/23 DOA: 07/15/2019  PCP: Patient, No Pcp Per  Patient coming from: Rehab.  Chief Complaint: Shortness of breath.  HPI: Levi Pruitt is a 74 y.o. male with history of recent CVA with right-sided hemiparesis dysphagia requiring PEG tube placement at that time patient also had a inferior wall MI treated medically presently on aspirin statins and Coreg was discharged to rehab on 27 January 7 days ago and about 3 days ago cardiology was reconsulted because patient was getting more short of breath and at that time Lasix was restarted.  This midnight patient became more short of breath chest x-ray was showing worsening infiltrates and patient was given Lasix 40 mg IV and hospitalist was consulted.  On my exam patient is short of breath requiring 100% nonrebreather.  Following commands but appears confused.  Is not febrile blood pressures are 120 x 60 pulse is around 60/min.  Given the worsening with respiratory status patient transferred back to hospital section.  I reviewed patient's recent labs discharge summary admission and all recent documents.   ED Course: Patient is a direct admit.  Review of Systems: As per HPI, rest all negative.   Past Medical History:  Diagnosis Date  . 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 IR GASTROSTOMY TUBE MOD SED  07/06/2019     reports that he has never smoked. He has never used smokeless tobacco. No history on file for alcohol and drug.  No Known Allergies  Family History  Problem Relation Age of Onset  . Heart disease Father   . High blood pressure Sister        is his twin    Prior to Admission medications   Medication Sig Start Date End Date Taking? Authorizing Provider  aspirin 81 MG chewable tablet Place 4 tablets (324 mg total) into feeding tube daily. 07/08/19   Dessa Phi, DO  atorvastatin  (LIPITOR) 80 MG tablet Place 1 tablet (80 mg total) into feeding tube daily at 6 PM. 07/07/19   Dessa Phi, DO  carvedilol (COREG) 3.125 MG tablet Place 1 tablet (3.125 mg total) into feeding tube 2 (two) times daily with a meal. 07/07/19   Dessa Phi, DO  clopidogrel (PLAVIX) 75 MG tablet Place 1 tablet (75 mg total) into feeding tube daily. 07/07/19   Dessa Phi, DO  enoxaparin (LOVENOX) 40 MG/0.4ML injection Inject 0.4 mLs (40 mg total) into the skin daily. 07/15/19   Angiulli, Lavon Paganini, PA-C  furosemide (LASIX) 40 MG tablet Place 1 tablet (40 mg total) into feeding tube daily. 07/15/19   Angiulli, Lavon Paganini, PA-C  insulin aspart (NOVOLOG) 100 UNIT/ML injection Inject 0-9 Units into the skin every 4 (four) hours. 07/15/19   Angiulli, Lavon Paganini, PA-C  ipratropium-albuterol (DUONEB) 0.5-2.5 (3) MG/3ML SOLN Take 3 mLs by nebulization every 6 (six) hours as needed. 07/15/19   Angiulli, Lavon Paganini, PA-C  Melatonin 3 MG TABS Take 2 tablets (6 mg total) by mouth at bedtime as needed (insomnia). 07/15/19   Angiulli, Lavon Paganini, PA-C  methylphenidate (RITALIN) 5 MG tablet Take 1 tablet (5 mg total) by mouth 2 (two) times daily with breakfast and lunch. 07/15/19   Angiulli, Lavon Paganini, PA-C  Nutritional Supplements (FEEDING SUPPLEMENT, JEVITY 1.5 CAL/FIBER,) LIQD Take 1,000 mLs by mouth continuous. 07/15/19   Angiulli, Lavon Paganini, PA-C  Water For Irrigation, Sterile (FREE WATER)  SOLN Place 150 mLs into feeding tube every 6 (six) hours. 07/15/19   Angiulli, Lavon Paganini, PA-C    Physical Exam: Constitutional: Moderately built and nourished. Vitals:   07/15/19 0607  BP: 125/72  Pulse: 69  Resp: (!) 29  Temp: 99.3 F (37.4 C)  TempSrc: Oral  SpO2: 96%   Eyes: Anicteric no pallor. ENMT: No discharge from the ears eyes nose or mouth. Neck: No masses.  No neck rigidity. Respiratory: No rhonchi or crepitations. Cardiovascular: S1-S2 heard. Abdomen: Soft nontender bowel sounds present PEG tube seen. Musculoskeletal: No  edema. Skin: No rash. Neurologic: Alert awake but does not follow commands right-sided hemiparesis. Psychiatric: Appears confused.   Labs on Admission: I have personally reviewed following labs and imaging studies  CBC: Recent Labs  Lab 07/12/19 0558  WBC 12.9*  HGB 14.6  HCT 45.9  MCV 96.4  PLT 99991111   Basic Metabolic Panel: Recent Labs  Lab 07/09/19 0521 07/12/19 0558  NA  --  141  K  --  4.3  CL  --  101  CO2  --  29  GLUCOSE  --  128*  BUN  --  22  CREATININE  --  1.05  CALCIUM  --  8.6*  MG 2.1  --   PHOS 4.5  --    GFR: Estimated Creatinine Clearance: 69.1 mL/min (by C-G formula based on SCr of 1.05 mg/dL). Liver Function Tests: No results for input(s): AST, ALT, ALKPHOS, BILITOT, PROT, ALBUMIN in the last 168 hours. No results for input(s): LIPASE, AMYLASE in the last 168 hours. No results for input(s): AMMONIA in the last 168 hours. Coagulation Profile: No results for input(s): INR, PROTIME in the last 168 hours. Cardiac Enzymes: No results for input(s): CKTOTAL, CKMB, CKMBINDEX, TROPONINI in the last 168 hours. BNP (last 3 results) No results for input(s): PROBNP in the last 8760 hours. HbA1C: No results for input(s): HGBA1C in the last 72 hours. CBG: Recent Labs  Lab 07/14/19 1208 07/14/19 1631 07/14/19 2018 07/14/19 2356 07/15/19 0355  GLUCAP 99 119* 156* 114* 151*   Lipid Profile: No results for input(s): CHOL, HDL, LDLCALC, TRIG, CHOLHDL, LDLDIRECT in the last 72 hours. Thyroid Function Tests: No results for input(s): TSH, T4TOTAL, FREET4, T3FREE, THYROIDAB in the last 72 hours. Anemia Panel: No results for input(s): VITAMINB12, FOLATE, FERRITIN, TIBC, IRON, RETICCTPCT in the last 72 hours. Urine analysis:    Component Value Date/Time   COLORURINE YELLOW 07/07/2019 1700   APPEARANCEUR CLEAR 07/07/2019 1700   LABSPEC 1.005 07/07/2019 1700   PHURINE 6.0 07/07/2019 1700   GLUCOSEU NEGATIVE 07/07/2019 1700   HGBUR NEGATIVE 07/07/2019 1700    BILIRUBINUR NEGATIVE 07/07/2019 1700   KETONESUR NEGATIVE 07/07/2019 1700   PROTEINUR NEGATIVE 07/07/2019 1700   NITRITE NEGATIVE 07/07/2019 1700   LEUKOCYTESUR NEGATIVE 07/07/2019 1700   Sepsis Labs: @LABRCNTIP (procalcitonin:4,lacticidven:4) ) Recent Results (from the past 240 hour(s))  Culture, Urine     Status: None   Collection Time: 07/07/19  5:50 PM   Specimen: Urine, Random  Result Value Ref Range Status   Specimen Description URINE, RANDOM  Final   Special Requests NONE  Final   Culture   Final    NO GROWTH Performed at [facility], [place] [address], [place], [place] [address]    Report Status 07/08/2019 FINAL  Final  C difficile quick scan w PCR reflex     Status: None   Collection Time: 07/09/19  5:57 PM   Specimen: STOOL  Result Value Ref Range Status   C Diff antigen NEGATIVE NEGATIVE Final   C Diff toxin NEGATIVE NEGATIVE Final   C Diff interpretation No C. difficile detected.  Final    Comment: Performed at [facility], [place] [address] [place], [place] [address]     Radiological Exams on Admission: DG Chest Port 1 View  Result Date: 07/15/2019 CLINICAL DATA:  Acute respiratory distress EXAM: PORTABLE CHEST 1 VIEW COMPARISON:  07/11/2019 FINDINGS: Cardiac shadow is stable. Persistent bilateral infiltrates are seen worst in the right upper lobe stable from the prior exam. Slight increase in the degree of infiltrative density in lung is seen. No acute bony abnormality is noted. IMPRESSION: Persistent consolidation in the right upper lobe with increasing left-sided infiltrate when compared with the prior study. Electronically Signed   By: Inez Catalina M.D.   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 Acute respiratory failure with  hypoxia requiring 100% nonrebreather this time with chest x-ray showing worsening infiltrates  -differentials include acute on chronic systolic heart failure last EF measured was 30 to 35% on June 25, 2019.  Patient did receive 1 dose of Lasix 40 mg IV I have ordered 1 more dose.  I am also placing patient on empiric antibiotics for possible pneumonia we will also check cardiac markers D-dimer.  Closely follow intake output metabolic panel and daily weights. 2. Recent stroke with right-sided hemiparesis on antiplatelet and statins. 3. Recent acute MI medically managed presently on Coreg aspirin Plavix and statins. 4. Stroke with dysphagia on PEG tube feeds. 5. Hyperglycemia presently on insulin coverage check hemoglobin A1c.  All labs including cardiac markers D-dimer BNP metabolic panel EKG Covid test are pending.  I discussed with patient's healthcare power of attorney Ms. Dorsett who at this time advised okay to continue with medication but no intubation but okay with CPR.  BiPAP is okay.   DVT prophylaxis: Lovenox. Code Status: DO NOT INTUBATE.  This is a reversal from previous [place]. Family Communication: Patient's healthcare power of attorney. Disposition Plan: To be determined. Consults called: None. Admission status: Inpatient.   Rise Patience MD [facility] Pager [phone].  If 7PM-7AM, please contact night-coverage [email or web address] Password [facility]  07/15/2019, 6:46 AM

## 2019-07-15 NOTE — Progress Notes (Addendum)
Pharmacy Antibiotic Note  Levi Pruitt is a 74 y.o. male admitted on 07/15/2019 with pneumonia.  Pharmacy has been consulted for vancomycin and zosyn dosing. SCr up to 1.39 today. Patient loaded with vancomycin 1750mg  IV x 1 this morning.  Plan: Zosyn 3.375g IV q8h (4 hour infusion).  Change vancomycin to 1500mg  IV q24h. Goal AUC 400-550. Expected AUC: 536 SCr used: 1.39 Monitor clinical progress, c/s, renal function F/u de-escalation plan/LOT, vancomycin levels as indicated      Temp (24hrs), Avg:98.5 F (36.9 C), Min:97.8 F (36.6 C), Max:99.3 F (37.4 C)  Recent Labs  Lab 07/12/19 0558 07/15/19 0652  WBC 12.9* 12.9*  CREATININE 1.05 1.39*  LATICACIDVEN  --  1.2    Estimated Creatinine Clearance: 52.2 mL/min (A) (by C-G formula based on SCr of 1.39 mg/dL (H)).    No Known Allergies   Elicia Lamp, PharmD, BCPS Please check AMION for all [place] contact numbers Clinical Pharmacist 07/15/2019 10:52 AM

## 2019-07-15 NOTE — Progress Notes (Signed)
Patient arrived from rehab per bed on 100% NRB.  Placed on monitor and noted to be in NSR.  Patient using abdominal muscles with respiratory efforts.  Will continue to monitor.

## 2019-07-15 NOTE — Progress Notes (Signed)
At approximately 0145, the nurse informed of increasing respiratory decline & intolerance to prn nebulizer treatment. When assessing the patient, he was showing signs of respiratory distress, using abdominals to breath. His oxygen saturation was 83-85% on the venturi mask. He was responsive to touch & by name, but was having labored breathing with audible rattling. He was sat at a high fowlers, but his oxygen saturation did not improve at all & his back was very sweaty. Rapid response was called & after initial assessment & interventions on call provider was called. IV lasix was ordered & this nurse administered. Patient improved slightly, but still has labored breathing. He was placed on a non-rebreather mask & oxygen saturation was still at 94-96%. On call provider came in to see patient. Orders were given for pending discharge.

## 2019-07-15 NOTE — Progress Notes (Signed)
8:16 PM - Pt is in VM 14 L, oxygen level ranges between 89-92%. Marjie Skiff, [place] made aware. No new order.  11:47 PM - Patient woke up, restlessness increase, took off VM and refused to wear it. Writer explained to the patient the need to keep the VM mask, patient still refused. Writer put patient in nasal cannula 5 L/min, oxygen level ranges 89%-91%. Patient went back to sleep and resting.  1:00 AM - Patient oxygen level went down to 85%. Still in abdominal breathing, wheezing increase, pooling of mucus heard during expiration. Writer called RT for another breathing treatment. RT is unavailable at the moment.    1:47 AM - Writer administer PRN breathing treatment. Patient HR was 38, O2 level was 70%. Writer STAT discontinue the treatment, and put pt back to VM to 14L/min. Oxygen increase and stays at 85%. Rapid response was called STAT.

## 2019-07-15 NOTE — Progress Notes (Signed)
Pharmacy Antibiotic Note  Levi Pruitt is a 74 y.o. male admitted on 07/15/2019 with pneumonia.  Pharmacy has been consulted for vancomycin and zosyn dosing.  Plan: Zosyn 3.375g IV q8h (4 hour infusion).  Vancomycin 1750 mg IV q24 hours F/u renal function, cultures and clinical course     Temp (24hrs), Avg:98.5 F (36.9 C), Min:97.8 F (36.6 C), Max:99.3 F (37.4 C)  Recent Labs  Lab 07/12/19 0558  WBC 12.9*  CREATININE 1.05    Estimated Creatinine Clearance: 69.1 mL/min (by C-G formula based on SCr of 1.05 mg/dL).    No Known Allergies    Thank you for allowing pharmacy to be a part of this patient's care.  Beverlee Nims 07/15/2019 6:55 AM

## 2019-07-15 NOTE — Progress Notes (Signed)
Contacted early this morning by nursing as patient placed on nonrebreather mask due to decrease in oxygen saturations and rapid response contacted.  Patient saturating in the low 70s-80s using a sensory muscles as well as pulse rate would dip to 38 and is very restless.  Repeat chest x-ray showed persistent consolidation some increasing left-sided infiltrate.  Patient currently afebrile.  [facility] contacted for patient to be discharged to acute care stepdown

## 2019-07-15 NOTE — Progress Notes (Signed)
Patient transferred to 4E Rm15 received by Sharyn Lull, RN. Writer made POA aware about patient transfer to other unit.

## 2019-07-15 NOTE — Progress Notes (Signed)
   The overall goal for the admission was met for:   Discharge location: Unplanned discharge to acute/respiratory distress  Length of Stay: 8 DAYS WITH DISCHARGE 07/14/19  Discharge activity level: Maximal assist - total assist x2  Home/community participation: Planned discharge to SNF after rehab however was discharged back to acute hospital early.  Services provided included: MD, RD, PT, OT, SLP, RN, CM, Pharmacy and Neuropsych, SW  Financial Services: Medicare A+B, BCBS Medicare D  Comments (or additional information):  Patient/Family verbalized understanding of follow-up arrangements: Other : Family aware of SNF placement in next two weeks; however, plan interrupted by unplanned discharge to acute  Individual responsible for coordination of the follow-up plan: [place], Dalbert Batman

## 2019-07-15 NOTE — Significant Event (Signed)
Rapid Response Event Note  Overview: Called d/t SpO2-83% on .55 VM and respiratory distress. Per RN, t/o night, pt has become increasingly lethargic with increased FiO2 demands. Pt was on [facility] on day shift.  Time Called: 0152 Arrival Time: 0155 Event Type: Neurologic, Respiratory  Initial Focused Assessment: Pt laying in bed with eyes closed, in obvious respiratory distress, with congested/gurgly respirations. Pt is using accessory muscles to breath. Pt difficult to arouse but when wakes up, is able to follow some simple commands. Lungs with rhonchi/crackles t/o. Pt placed on NRB prior to my arrival. HR-79, BP-164/95, RR-42, SpO2-91% on NRB.  Interventions: NTS x 1-moderate amount thick tan secretions-SpO2 increased to 96% on NRB after suctioning PCXR-Persistent consolidation in the right upper lobe with increasing left-sided infiltrate when compared with the prior study. ABG-7.30/72/105/35 40mg  lasix IV  Plan of Care (if not transferred): Rehab Provider notified of pt's decline and test results by rehab charge RN. Provider on way to see pt. Event Summary:   at  Event End: 0305    at  Update: 0415: Will move to PCU.         Levi Pruitt

## 2019-07-16 ENCOUNTER — Inpatient Hospital Stay (HOSPITAL_COMMUNITY): Payer: Medicare Other

## 2019-07-16 DIAGNOSIS — Z9189 Other specified personal risk factors, not elsewhere classified: Secondary | ICD-10-CM

## 2019-07-16 LAB — COMPREHENSIVE METABOLIC PANEL
ALT: 138 U/L — ABNORMAL HIGH (ref 0–44)
AST: 101 U/L — ABNORMAL HIGH (ref 15–41)
Albumin: 2.4 g/dL — ABNORMAL LOW (ref 3.5–5.0)
Alkaline Phosphatase: 102 U/L (ref 38–126)
Anion gap: 16 — ABNORMAL HIGH (ref 5–15)
BUN: 37 mg/dL — ABNORMAL HIGH (ref 8–23)
CO2: 28 mmol/L (ref 22–32)
Calcium: 8.4 mg/dL — ABNORMAL LOW (ref 8.9–10.3)
Chloride: 98 mmol/L (ref 98–111)
Creatinine, Ser: 1.43 mg/dL — ABNORMAL HIGH (ref 0.61–1.24)
GFR calc Af Amer: 56 mL/min — ABNORMAL LOW (ref >60)
GFR calc non Af Amer: 48 mL/min — ABNORMAL LOW (ref >60)
Glucose, Bld: 86 mg/dL (ref 70–99)
Potassium: 5.3 mmol/L — ABNORMAL HIGH (ref 3.5–5.1)
Sodium: 142 mmol/L (ref 135–145)
Total Bilirubin: 1 mg/dL (ref 0.3–1.2)
Total Protein: 5.6 g/dL — ABNORMAL LOW (ref 6.5–8.1)

## 2019-07-16 LAB — LACTIC ACID, PLASMA: Lactic Acid, Venous: 1.9 mmol/L (ref 0.5–1.9)

## 2019-07-16 LAB — TSH: TSH: 2.262 u[IU]/mL (ref 0.350–4.500)

## 2019-07-16 LAB — CBC
HCT: 40.9 % (ref 39.0–52.0)
Hemoglobin: 13.4 g/dL (ref 13.0–17.0)
MCH: 30.9 pg (ref 26.0–34.0)
MCHC: 32.8 g/dL (ref 30.0–36.0)
MCV: 94.5 fL (ref 80.0–100.0)
Platelets: 276 10*3/uL (ref 150–400)
RBC: 4.33 MIL/uL (ref 4.22–5.81)
RDW: 13.8 % (ref 11.5–15.5)
WBC: 15.1 10*3/uL — ABNORMAL HIGH (ref 4.0–10.5)
nRBC: 0 % (ref 0.0–0.2)

## 2019-07-16 LAB — T4, FREE: Free T4: 0.97 ng/dL (ref 0.61–1.12)

## 2019-07-16 LAB — AMMONIA: Ammonia: 25 umol/L (ref 9–35)

## 2019-07-16 LAB — GLUCOSE, CAPILLARY
Glucose-Capillary: 118 mg/dL — ABNORMAL HIGH (ref 70–99)
Glucose-Capillary: 153 mg/dL — ABNORMAL HIGH (ref 70–99)
Glucose-Capillary: 72 mg/dL (ref 70–99)
Glucose-Capillary: 75 mg/dL (ref 70–99)
Glucose-Capillary: 75 mg/dL (ref 70–99)
Glucose-Capillary: 84 mg/dL (ref 70–99)

## 2019-07-16 LAB — PROCALCITONIN: Procalcitonin: 0.12 ng/mL

## 2019-07-16 LAB — MRSA PCR SCREENING: MRSA by PCR: NEGATIVE

## 2019-07-16 MED ORDER — JEVITY 1.5 CAL/FIBER PO LIQD
1000.0000 mL | ORAL | Status: DC
  Administered 2019-07-16 – 2019-07-18 (×3): 1000 mL
  Filled 2019-07-16 (×5): qty 1000

## 2019-07-16 MED ORDER — PRO-STAT SUGAR FREE PO LIQD
30.0000 mL | Freq: Two times a day (BID) | ORAL | Status: DC
  Administered 2019-07-16 – 2019-07-21 (×10): 30 mL via ORAL
  Filled 2019-07-16 (×10): qty 30

## 2019-07-16 MED ORDER — CHLORHEXIDINE GLUCONATE 0.12 % MT SOLN
15.0000 mL | Freq: Two times a day (BID) | OROMUCOSAL | Status: DC
  Administered 2019-07-16 – 2019-07-21 (×11): 15 mL via OROMUCOSAL
  Filled 2019-07-16 (×10): qty 15

## 2019-07-16 MED ORDER — METHYLPHENIDATE HCL 5 MG PO TABS
5.0000 mg | ORAL_TABLET | Freq: Two times a day (BID) | ORAL | Status: DC
  Administered 2019-07-16 – 2019-07-21 (×11): 5 mg
  Filled 2019-07-16 (×11): qty 1

## 2019-07-16 MED ORDER — ORAL CARE MOUTH RINSE
15.0000 mL | Freq: Two times a day (BID) | OROMUCOSAL | Status: DC
  Administered 2019-07-16 – 2019-07-21 (×11): 15 mL via OROMUCOSAL

## 2019-07-16 MED ORDER — FUROSEMIDE 40 MG PO TABS
40.0000 mg | ORAL_TABLET | Freq: Every day | ORAL | Status: DC
  Administered 2019-07-16 – 2019-07-20 (×5): 40 mg via ORAL
  Filled 2019-07-16 (×5): qty 1

## 2019-07-16 NOTE — Discharge Summary (Signed)
Physician Discharge Summary  Patient ID: Levi Pruitt MRN: [id number] DOB/AGE: 1946-03-08 74 y.o.  Admit date: 07/07/2019 Discharge date: 07/15/2019  Discharge Diagnoses:  Principal Problem:   Acute respiratory failure with hypoxia [facility]) Active Problems:   Dysphagia, post-stroke   Arterial ischemic stroke, PCA (posterior cerebral artery), left, acute ([place])   At high risk for aspiration   Discharged Condition: Guarded   Significant Diagnostic Studies:  Result Date: 07/09/2019 CLINICAL DATA:  In-patient encounter for arterial ischemic stroke. EXAM: CHEST - 2 VIEW COMPARISON:  July 07, 2019. FINDINGS: Moderate severity bilateral infiltrates are seen. This is most prominent within the bilateral apices and bilateral lung bases and is predominant stable in severity when compared to the prior study. Small bilateral pleural effusions are noted. No pneumothorax is identified. The cardiac silhouette is moderately enlarged. Degenerative changes seen throughout the thoracic spine. IMPRESSION: 1. Moderate severity bilateral pulmonary infiltrates, stable in severity when compared to the prior study. 2. Small bilateral pleural effusions. Electronically Signed   By: Virgina Norfolk M.D.   On: 07/09/2019 17:54    DG CHEST PORT 1 VIEW  Result Date: 07/16/2019 CLINICAL DATA:  Short of breath, stroke EXAM: PORTABLE CHEST 1 VIEW COMPARISON:  07/15/2019, 07/11/2019 FINDINGS: Single frontal view of the chest demonstrates progressive dense consolidation within the right upper lobe. Persistent multifocal airspace disease elsewhere throughout the lungs, greatest in the left perihilar region. No large effusion or pneumothorax. Cardiac silhouette is stable. IMPRESSION: 1. Findings consistent with multifocal pneumonia, with increasing consolidation right upper lobe. Electronically Signed   By: Randa Ngo M.D.   On: 07/16/2019 08:51    Result Date: 07/15/2019 CLINICAL DATA:  Acute respiratory distress EXAM:  PORTABLE CHEST 1 VIEW COMPARISON:  07/11/2019 FINDINGS: Cardiac shadow is stable. Persistent bilateral infiltrates are seen worst in the right upper lobe stable from the prior exam. Slight increase in the degree of infiltrative density in lung is seen. No acute bony abnormality is noted. IMPRESSION: Persistent consolidation in the right upper lobe with increasing left-sided infiltrate when compared with the prior study. Electronically Signed   By: Inez Catalina M.D.   On: 07/15/2019 02:34   DG Chest Port 1 View  Result Date: 07/11/2019 CLINICAL DATA:  Acute respiratory distress EXAM: PORTABLE CHEST 1 VIEW COMPARISON:  07/09/2019 FINDINGS: Cardiac shadow is stable. Increasing bilateral infiltrates are seen particularly in the right upper lobe. No sizable effusion is seen. No bony abnormality is noted. IMPRESSION: Increase in bilateral infiltrates particularly in the right upper lobe. Electronically Signed   By: Inez Catalina M.D.   On: 07/11/2019 22:37   DG CHEST PORT 1 VIEW  Result Date: 07/02/2019 CLINICAL DATA:  74 year old male with shortness of breath. EXAM: PORTABLE CHEST 1 VIEW COMPARISON:  Chest radiograph dated 06/25/2019. FINDINGS: Feeding tube extends below the diaphragm. Bilateral confluent airspace opacities may represent edema or multifocal pneumonia or combination. Probable small bilateral pleural effusions. No pneumothorax. Stable cardiac silhouette. No acute osseous pathology. IMPRESSION: Bilateral confluent densities may represent pneumonia or edema. Clinical correlation and follow-up recommended. Electronically Signed   By: Anner Crete M.D.   On: 07/02/2019 20:48    VAS [place] LOWER EXTREMITY VENOUS (DVT)  Result Date: 07/10/2019  Lower Venous Study Indications: Edema.  Risk Factors: CVA, CHF. Anticoagulation: Heparin. Limitations: Poor ultrasound/tissue interface. Comparison Study: No prior exam. Performing Technologist: Baldwin Crown ARDMS, RVT  Examination Guidelines: A complete  evaluation includes B-mode imaging, spectral Doppler, color Doppler, and power Doppler as needed of all accessible portions  of each vessel. Bilateral testing is considered an integral part of a complete examination. Limited examinations for reoccurring indications may be performed as noted.  +---------+---------------+---------+-----------+----------+------------------+ RIGHT    CompressibilityPhasicitySpontaneityPropertiesThrombus Aging     +---------+---------------+---------+-----------+----------+------------------+ CFV      Full           Yes      Yes                                     +---------+---------------+---------+-----------+----------+------------------+ SFJ      Full                                                            +---------+---------------+---------+-----------+----------+------------------+ FV Prox  Full                                                            +---------+---------------+---------+-----------+----------+------------------+ FV Mid   Full                                                            +---------+---------------+---------+-----------+----------+------------------+ FV DistalFull                                         seen with color                                                          flow               +---------+---------------+---------+-----------+----------+------------------+ PFV      Full                                                            +---------+---------------+---------+-----------+----------+------------------+ POP      Full           Yes      Yes                                     +---------+---------------+---------+-----------+----------+------------------+ PTV      Full                                                            +---------+---------------+---------+-----------+----------+------------------+  PERO     Full                                                             +---------+---------------+---------+-----------+----------+------------------+   +---------+---------------+---------+-----------+----------+------------------+ LEFT     CompressibilityPhasicitySpontaneityPropertiesThrombus Aging     +---------+---------------+---------+-----------+----------+------------------+ CFV      Full           Yes      Yes                                     +---------+---------------+---------+-----------+----------+------------------+ SFJ      Full                                                            +---------+---------------+---------+-----------+----------+------------------+ FV Prox  Full                                                            +---------+---------------+---------+-----------+----------+------------------+ FV Mid   Full                                                            +---------+---------------+---------+-----------+----------+------------------+ FV DistalFull                                         seen with color                                                          flow               +---------+---------------+---------+-----------+----------+------------------+ PFV      Full                                                            +---------+---------------+---------+-----------+----------+------------------+ POP      Full           Yes      Yes                                     +---------+---------------+---------+-----------+----------+------------------+ PTV      Full                                                            +---------+---------------+---------+-----------+----------+------------------+  PERO     Full                                                            +---------+---------------+---------+-----------+----------+------------------+     Summary: Right: There is no evidence of deep vein thrombosis in the lower  extremity. No cystic structure found in the popliteal fossa. Left: There is no evidence of deep vein thrombosis in the lower extremity. No cystic structure found in the popliteal fossa.  *See table(s) above for measurements and observations. Electronically signed by Servando Snare MD on 07/10/2019 at 8:44:09 AM.    Final     Labs:  Basic Metabolic Panel: Recent Labs  Lab 07/12/19 0558 07/15/19 0652 07/16/19 0253  NA 141 143 142  K 4.3 5.3* 5.3*  CL 101 95* 98  CO2 29 37* 28  GLUCOSE 128* 80 86  BUN 22 36* 37*  CREATININE 1.05 1.39* 1.43*  CALCIUM 8.6* 8.7* 8.4*    CBC: Recent Labs  Lab 07/12/19 0558 07/15/19 0652 07/16/19 0253  WBC 12.9* 12.9* 15.1*  NEUTROABS  --  9.7*  --   HGB 14.6 15.6 13.4  HCT 45.9 48.2 40.9  MCV 96.4 96.2 94.5  PLT 277 238 276    CBG: Recent Labs  Lab 07/15/19 2054 07/15/19 2307 07/16/19 0424 07/16/19 0817 07/16/19 1141  GLUCAP 81 87 75 84 72    Brief HPI:   Jossue Sumi is a 74 y.o. male due to lack of medical care for 40+ years but in relatively good health who was admitted on 06/18/2019 after found down with right facial droop, right hemiparesis and dysarthria.  MRI brain revealed acute large left PCA infarct and small acute left frontal lobe infarct with chronic right temporal and right occipital lobe infarcts.  2D echo showed EF of 25 to 30% with grade 3 diastolic dysfunction and and akinesis of inferior, inferior lateral, apical and distal septal wall.    CTA head/neck showed acute occlusion of distal left vertebral artery and left PCA, left PCA territory infarct, chronic occlusion right PCA, high-grade stenosis right ICA bulb approaching string sign and 60% proximal left ICA stenosis. He was started on IV antibiotics due to concerns of aspiration pneumonia and cardiology consulted due to abnormal EKG.  He was felt to have inferior wall STEMI but not felt to be candidate for intervention.  Medical management recommended and he was started on IV  heparin then transition to aspirin Plavix on 1/10.  Hospital course significant for lethargy with difficulty handling oral secretions and tube feeds ongoing for nutritional support.  Palliative care was consulted to discuss goals of care and family elected on full scope of care.  He continued to have issues with recurrent hypoxia last of 1/23 felt to be due to aspiration event and continues on antibiotics.  P.o. intake has been variable and PEG was placed on 1/26 by Dr. Laurence Ferrari.  He was also found to have urinary retention without urine output for greater than 18 hours.  Therapy has been ongoing and patient was limited by dense right hemiplegia with right inattention, poor postural reflexes, delayed processing with difficulty sequencing, oral apraxia, dysarthria with language of confusion as well as cognitive deficits affecting overall functional status.  Therapy working on pregait activity.  CIR was recommended due to  functional decline   Hospital course: Aragon Gulden was admitted to rehab 07/07/2019 for inpatient therapies to consist of PT, ST and OT at least three hours five days a week. Past admission physiatrist, therapy team and rehab RN have worked together to provide customized collaborative inpatient rehab.  He was kept n.p.o. due to his fluctuating mental status.  At admission he required max assist with bed mobility and was able to sit at the edge of bed for 4 minutes.  He required close to total assist with basic ADL tasks.  He exhibited severe dysphagia requiring max cues to swallow honey thick liquids as well as pure recent secretion management therefore n.p.o. was recommended with trials of oral with speech therapy only.  Bilateral leg Dopplers were negative for DVT.  He continued to require oxygen via nasal cannula due to intermittent hypoxia.  Blood pressures were monitored on 3 times daily basis and were noted to be soft.  He was noted to have DOE and increased work of breathing on 1/29  therefore CXR was ordered for work up and he was treated with IV Lasix for diuresis.   Cardiology was consulted for assistance in management of CHF due to upward trending weight and tachypnea. Foley was placed for strict I/O and he was treated with addition dose of IV lasix with improvement in respiratory status. Ritalin was added on 2/04 to help with attention and activation. He was tolerating tube feeds however has had diarrhea therefore Lomotil scheduled 3 times daily.  Stools were negative for C. difficile.  He continued to have issues with sleep-wake disruption and melatonin was added to help with sleep hygiene.  Has had issues with sleep-wake disruption and bouts of restlessness during the day.  He was showing some improvement in activity tolerance and was able to tolerate sitting at the edge of bed for 10 minutes was min assist.  He was able to attend to task for 60 seconds with max multimodal cues.  On 02/04 early am, he developed acute respiratory distress requiring NRB mask as well as decrease in LOC. Dr. Hal Hope was consulted for input and patient transferred to acute floor for management/closer monitoring.     Disposition: Acute hospital.    Allergies as of 07/15/2019   No Known Allergies     Medication List    STOP taking these medications   [employer] Vitamin D3 250 MCG (10000 UT) Caps Generic drug: Cholecalciferol   vitamin C 250 MG tablet Commonly known as: ASCORBIC ACID     TAKE these medications   aspirin 81 MG chewable tablet Place 4 tablets (324 mg total) into feeding tube daily.   carvedilol 3.125 MG tablet Commonly known as: COREG Place 1 tablet (3.125 mg total) into feeding tube 2 (two) times daily with a meal.   clopidogrel 75 MG tablet Commonly known as: PLAVIX Place 1 tablet (75 mg total) into feeding tube daily.   enoxaparin 40 MG/0.4ML injection Commonly known as: LOVENOX Inject 0.4 mLs (40 mg total) into the skin daily.   feeding supplement (JEVITY 1.5  CAL/FIBER) Liqd Take 1,000 mLs by mouth continuous.   free water Soln Place 150 mLs into feeding tube every 6 (six) hours.   furosemide 40 MG tablet Commonly known as: LASIX Place 1 tablet (40 mg total) into feeding tube daily.   insulin aspart 100 UNIT/ML injection Commonly known as: novoLOG Inject 0-9 Units into the skin every 4 (four) hours.   ipratropium-albuterol 0.5-2.5 (3) MG/3ML Soln Commonly known as:  DUONEB Take 3 mLs by nebulization every 6 (six) hours as needed.   Melatonin 3 MG Tabs Take 2 tablets (6 mg total) by mouth at bedtime as needed (insomnia).   methylphenidate 5 MG tablet Commonly known as: RITALIN Take 1 tablet (5 mg total) by mouth 2 (two) times daily with breakfast and lunch.     ASK your doctor about these medications   atorvastatin 80 MG tablet Commonly known as: LIPITOR Place 1 tablet (80 mg total) into feeding tube daily at 6 PM.        Signed: Bary Leriche 07/16/2019, 1:40 PM

## 2019-07-16 NOTE — Progress Notes (Signed)
Initial Nutrition Assessment  DOCUMENTATION CODES:   Not applicable  INTERVENTION:  - Jevity 1.5 @ 32ml/hr, advance 42ml/h every 8 hours until goal rate of 8ml/hr  -73mL Pro-stat BID via tube  -Free water flushes per MD, currently 134mL Q6 (616mL)  Tube feed regimen with free water flushes provides 2000 kcal, 106 grams of protein, and 1512 ml water  NUTRITION DIAGNOSIS:   Inadequate oral intake related to dysphagia as evidenced by NPO status.    GOAL:   Patient will meet greater than or equal to 90% of their needs    MONITOR:   TF tolerance, I & O's, Labs, Weight trends  REASON FOR ASSESSMENT:   Consult Enteral/tube feeding initiation and management  ASSESSMENT:  RD working remotely.  74 year old male with history of recent CVA with right-sided hemiparesis, dysphagia requiring PEG tube placement who was discharged to rehab on January 27. Patient presents from rehab with increasing shortness of breath, CXR showed worsening infiltrates and patient admitted for acute respiratory failure with hypoxia.  1/9: admitted to [facility] for acute CVA 1/11:Small bore feeding tube placed by diagnostic radiology, tip of tube in distal duodenum.  1/12: tube feeding initiated 1/14: TF held d/t aspiration pneumonia 1/18: s/p MBS -moderate oropharyngeal dysphagia w/ silent aspiration 1/26: s/p PEG placed in IR; Osmolite 1.5 initiated  Per notes, pt remains very confused. High suspicion for ongoing aspiration and very high risk for worsening or decompensation. Palliative care on board, goals of care discussed with family. Plans to continue with current treatments through the weekend, depending on outcomes, treatment plans to be discussed again at the beginning of next week.  2/4 RD notes: patient with loose stools, tube feeding changed to fiber containing formula.  -Jevity 1.5 @ 60 ml/hr x 20 hrs   Non-pitting BUE;BLE edema per 2/4 RN assessment  Current wt 195.8 lbs  Medications  reviewed and include:Lovenox, SS novolog, Ritalin, Zosyn, Vancomycin  Free water 150 ml every 6 hrs  Labs: CBGs 84,75,87,81,75 x 24 hrs, K 5.3 (H), BUN 37 (H), Cr 1.43 (H), WBC 15.1 (H)  NUTRITION - FOCUSED PHYSICAL EXAM: Unable to complete at this time, RD working remotely.  Diet Order:   Diet Order            Diet NPO time specified  Diet effective now              EDUCATION NEEDS:   No education needs have been identified at this time  Skin:  Skin Assessment: Reviewed RN Assessment(MASD; buttocks;groin;perineum)  Last BM:  2/4 (type 7)  Height:   Ht Readings from Last 1 Encounters:  06/20/19 5\' 9"  (1.753 m)    Weight:   Wt Readings from Last 1 Encounters:  07/14/19 89 kg    Ideal Body Weight:  72.7 kg  BMI:  There is no height or weight on file to calculate BMI.  Estimated Nutritional Needs:   Kcal:  2000-2200  Protein:  90-105  Fluid:  >/= 2 L/day   Lajuan Lines, RD, LDN Clinical Nutrition [place] Telephone [phone] After Hours/Weekend Pager: [phone]

## 2019-07-16 NOTE — Progress Notes (Signed)
Pt's MPA Sue Lush [place]) called today.  She seemed confused as to what the partial code status of patient fully entails.  She seemed to think that the Pt could be intubated without subsequent mechanical ventilation.  MPA will need clarification from MD.

## 2019-07-16 NOTE — Progress Notes (Signed)
[facility] Progress Note  Patient: Levi Pruitt    [id number]  DOA: 07/15/2019     Date of Service: the patient was seen and examined on 07/16/2019  No chief complaint on file.  Brief hospital course:  Manveer Sorlie is a 74 y.o. male with history of recent CVA with right-sided hemiparesis dysphagia requiring PEG tube placement at that time patient also had a inferior wall MI treated medically presently on aspirin statins and Coreg was discharged to rehab on 27 January 7 days ago and about 3 days ago cardiology was reconsulted because patient was getting more short of breath and at that time Lasix was restarted.  This midnight patient became more short of breath chest x-ray was showing worsening infiltrates and patient was given Lasix 40 mg IV and hospitalist was consulted.  On my exam patient is short of breath requiring 100% nonrebreather.  Following commands but appears confused.  Is not febrile blood pressures are 120 x 60 pulse is around 60/min.  Given the worsening with respiratory status patient transferred back to hospital section. Patient currently suffering from acute hypoxic respiratory failure from aspiration pneumonia. Currently further plan is continue antibiotic and continue to engage with the family regarding goals of care.  Assessment and Plan: 1.  Acute hypoxic respiratory failure. Aspiration pneumonia. Sepsis. Patient remains at risk for recurrent aspiration. We will resume tube feeding. MRSA PCR negative discontinue vancomycin. Continue with IV Zosyn for now. Low threshold to transition to Unasyn. Patient received IV Lasix will continue with oral Lasix. Monitor.  2.  Recent CVA with residual weakness Dysphagia SP PEG tube placement Resuming tube feeding. Continue home medications as well as antiplatelet and statins.  3.  Recent non-STEMI Medically managed. Continue aspirin Plavix and statins. Holding beta-blocker for now due to sepsis.  4.  Goals of care  Discussed with wife that the patient is suffering from multiorgan failure. Patient remains at high risk for recurrent aspiration as well as poor prognosis down the road given his significant deconditioning and weakness. Wife is hopeful for recovery adequate enough for rehab.  Will monitor.  Diet: Tube feedings n.p.o. DVT Prophylaxis: Subcutaneous Heparin    Advance goals of care discussion: Limited code no intubation  Family Communication: no family was present at bedside, at the time of interview.  Discussed with wife on the phone Opportunity was given to ask question and all questions were answered satisfactorily.   Disposition:  Pt is from CIR, admitted with sepsis due to pneumonia, still has sepsis physiology as well as hypoxia, which precludes a safe discharge. Discharge to CIR, when stable.  Subjective: Follows command.  No nausea no vomiting.  No acute events overnight.  Oxygenation improving overnight.  Physical Exam: General:  alert oriented to time and place.  Appear in moderate distress, affect flat in affect Eyes: PERRL ENT: Oral Mucosa Clear, dry  Neck: difficult to assess  JVD,  Cardiovascular: S1 and S2 Present, no Murmur,  Respiratory: increased respiratory effort, Bilateral Air entry equal and Decreased, bilateral  Crackles, noi wheezes Abdomen: Bowel Sound present, Soft and no tenderness,  Skin: no rash Extremities: no Pedal edema, no calf tenderness Neurologic: without any new focal findings Gait not checked due to patient safety concerns  Vitals:   07/16/19 0822 07/16/19 1141 07/16/19 1630 07/16/19 2001  BP: (!) 111/59 114/65 113/66 (!) 107/52  Pulse: (!) 54 70 (!) 56 60  Resp: 16 16 (!) 25 19  Temp: 98.3 F (36.8 C) 97.9 F (36.6 C)  97.8 F (36.6 C) 97.6 F (36.4 C)  TempSrc: Axillary Oral Axillary Axillary  SpO2: 100% 99% 99% 99%    Intake/Output Summary (Last 24 hours) at 07/16/2019 2053 Last data filed at 07/16/2019 0430 Gross per 24 hour  Intake  -  Output 400 ml  Net -400 ml   There were no vitals filed for this visit.  Data Reviewed: I have personally reviewed and interpreted daily labs, tele strips, imagings as discussed above. I reviewed all nursing notes, pharmacy notes, vitals, pertinent old records I have discussed plan of care as described above with RN and patient/family.  CBC: Recent Labs  Lab 07/12/19 0558 07/15/19 0652 07/16/19 0253  WBC 12.9* 12.9* 15.1*  NEUTROABS  --  9.7*  --   HGB 14.6 15.6 13.4  HCT 45.9 48.2 40.9  MCV 96.4 96.2 94.5  PLT 277 238 [id number]   Basic Metabolic Panel: Recent Labs  Lab 07/12/19 0558 07/15/19 0652 07/16/19 0253  NA 141 143 142  K 4.3 5.3* 5.3*  CL 101 95* 98  CO2 29 37* 28  GLUCOSE 128* 80 86  BUN 22 36* 37*  CREATININE 1.05 1.39* 1.43*  CALCIUM 8.6* 8.7* 8.4*    Studies: DG CHEST PORT 1 VIEW  Result Date: 07/16/2019 CLINICAL DATA:  Short of breath, stroke EXAM: PORTABLE CHEST 1 VIEW COMPARISON:  07/15/2019, 07/11/2019 FINDINGS: Single frontal view of the chest demonstrates progressive dense consolidation within the right upper lobe. Persistent multifocal airspace disease elsewhere throughout the lungs, greatest in the left perihilar region. No large effusion or pneumothorax. Cardiac silhouette is stable. IMPRESSION: 1. Findings consistent with multifocal pneumonia, with increasing consolidation right upper lobe. Electronically Signed   By: Randa Ngo M.D.   On: 07/16/2019 08:51    Scheduled Meds: . aspirin  324 mg Per Tube Daily  . atorvastatin  80 mg Per Tube q1800  . chlorhexidine  15 mL Mouth Rinse BID  . clopidogrel  75 mg Per Tube Daily  . enoxaparin (LOVENOX) injection  40 mg Subcutaneous Q24H  . feeding supplement (PRO-STAT SUGAR FREE 64)  30 mL Oral BID  . free water  150 mL Per Tube Q6H  . furosemide  40 mg Oral Daily  . insulin aspart  0-9 Units Subcutaneous Q4H  . mouth rinse  15 mL Mouth Rinse q12n4p  . methylphenidate  5 mg Per Tube BID WC    Continuous Infusions: . feeding supplement (JEVITY 1.5 CAL/FIBER) 1,000 mL (07/16/19 1220)  . piperacillin-tazobactam (ZOSYN)  IV 3.375 g (07/16/19 1755)   PRN Meds: acetaminophen **OR** acetaminophen, ipratropium-albuterol, Melatonin, ondansetron **OR** ondansetron (ZOFRAN) IV  Time spent: 35 minutes  Author: Berle Mull, MD [facility] 07/16/2019 8:53 PM  To reach On-call, see care teams to locate the attending and reach out to them via [email or web address]. If 7PM-7AM, please contact night-coverage If you still have difficulty reaching the attending provider, please page the [facility] (Director on Call) for [facility] on amion for assistance.

## 2019-07-17 LAB — CBC WITH DIFFERENTIAL/PLATELET
Abs Immature Granulocytes: 0.09 10*3/uL — ABNORMAL HIGH (ref 0.00–0.07)
Basophils Absolute: 0.1 10*3/uL (ref 0.0–0.1)
Basophils Relative: 1 %
Eosinophils Absolute: 0.4 10*3/uL (ref 0.0–0.5)
Eosinophils Relative: 3 %
HCT: 42.8 % (ref 39.0–52.0)
Hemoglobin: 13.8 g/dL (ref 13.0–17.0)
Immature Granulocytes: 1 %
Lymphocytes Relative: 12 %
Lymphs Abs: 1.7 10*3/uL (ref 0.7–4.0)
MCH: 30.5 pg (ref 26.0–34.0)
MCHC: 32.2 g/dL (ref 30.0–36.0)
MCV: 94.5 fL (ref 80.0–100.0)
Monocytes Absolute: 1.8 10*3/uL — ABNORMAL HIGH (ref 0.1–1.0)
Monocytes Relative: 13 %
Neutro Abs: 10 10*3/uL — ABNORMAL HIGH (ref 1.7–7.7)
Neutrophils Relative %: 70 %
Platelets: 321 10*3/uL (ref 150–400)
RBC: 4.53 MIL/uL (ref 4.22–5.81)
RDW: 13.9 % (ref 11.5–15.5)
WBC: 14 10*3/uL — ABNORMAL HIGH (ref 4.0–10.5)
nRBC: 0 % (ref 0.0–0.2)

## 2019-07-17 LAB — PHOSPHORUS: Phosphorus: 3.6 mg/dL (ref 2.5–4.6)

## 2019-07-17 LAB — COMPREHENSIVE METABOLIC PANEL
ALT: 155 U/L — ABNORMAL HIGH (ref 0–44)
AST: 104 U/L — ABNORMAL HIGH (ref 15–41)
Albumin: 2.5 g/dL — ABNORMAL LOW (ref 3.5–5.0)
Alkaline Phosphatase: 129 U/L — ABNORMAL HIGH (ref 38–126)
Anion gap: 12 (ref 5–15)
BUN: 34 mg/dL — ABNORMAL HIGH (ref 8–23)
CO2: 34 mmol/L — ABNORMAL HIGH (ref 22–32)
Calcium: 8.5 mg/dL — ABNORMAL LOW (ref 8.9–10.3)
Chloride: 96 mmol/L — ABNORMAL LOW (ref 98–111)
Creatinine, Ser: 1.41 mg/dL — ABNORMAL HIGH (ref 0.61–1.24)
GFR calc Af Amer: 57 mL/min — ABNORMAL LOW (ref >60)
GFR calc non Af Amer: 49 mL/min — ABNORMAL LOW (ref >60)
Glucose, Bld: 176 mg/dL — ABNORMAL HIGH (ref 70–99)
Potassium: 3.3 mmol/L — ABNORMAL LOW (ref 3.5–5.1)
Sodium: 142 mmol/L (ref 135–145)
Total Bilirubin: 0.9 mg/dL (ref 0.3–1.2)
Total Protein: 6.1 g/dL — ABNORMAL LOW (ref 6.5–8.1)

## 2019-07-17 LAB — GLUCOSE, CAPILLARY
Glucose-Capillary: 154 mg/dL — ABNORMAL HIGH (ref 70–99)
Glucose-Capillary: 157 mg/dL — ABNORMAL HIGH (ref 70–99)
Glucose-Capillary: 113 mg/dL — ABNORMAL HIGH (ref 70–99)
Glucose-Capillary: 151 mg/dL — ABNORMAL HIGH (ref 70–99)
Glucose-Capillary: 130 mg/dL — ABNORMAL HIGH (ref 70–99)

## 2019-07-17 LAB — MAGNESIUM: Magnesium: 2.5 mg/dL — ABNORMAL HIGH (ref 1.7–2.4)

## 2019-07-17 MED ORDER — LABETALOL HCL 5 MG/ML IV SOLN
10.0000 mg | Freq: Once | INTRAVENOUS | Status: DC
  Filled 2019-07-17: qty 4

## 2019-07-17 MED ORDER — FREE WATER
100.0000 mL | Freq: Three times a day (TID) | Status: DC
  Administered 2019-07-17 – 2019-07-18 (×3): 100 mL

## 2019-07-17 MED ORDER — FUROSEMIDE 10 MG/ML IJ SOLN
40.0000 mg | Freq: Once | INTRAMUSCULAR | Status: AC
  Administered 2019-07-17: 02:00:00 40 mg via INTRAVENOUS
  Filled 2019-07-17: qty 4

## 2019-07-17 MED ORDER — POTASSIUM CHLORIDE 20 MEQ PO PACK
40.0000 meq | PACK | Freq: Once | ORAL | Status: AC
  Administered 2019-07-17: 12:00:00 40 meq
  Filled 2019-07-17: qty 2

## 2019-07-17 MED ORDER — SODIUM CHLORIDE 0.9 % IV SOLN
1.5000 g | Freq: Three times a day (TID) | INTRAVENOUS | Status: AC
  Administered 2019-07-17 – 2019-07-20 (×10): 1.5 g via INTRAVENOUS
  Filled 2019-07-17 (×2): qty 4
  Filled 2019-07-17: qty 1.5
  Filled 2019-07-17 (×3): qty 4
  Filled 2019-07-17: qty 1.5
  Filled 2019-07-17: qty 4
  Filled 2019-07-17: qty 1.5
  Filled 2019-07-17 (×2): qty 4

## 2019-07-17 NOTE — Progress Notes (Signed)
Pharmacy Antibiotic Note  Levi Pruitt is a 74 y.o. male admitted on 07/15/2019 with pneumonia. There is concern for aspiration.  Pharmacy has been consulted for Unasyn dosing. SCr up to 1.41 today. Vancomycin has been discontinued.   WBC is elevated but decreasing from 15.1>>14. PCT is negative. Lactate is 1.9. Of note, LFTs are also elevated. No positive cultures. COVID-19 test negative.  Plan: Discontinue Zosyn Start Unasyn at 1.5 g q8 hours at same time as next scheduled Zosyn dose. Monitor clinical progress, c/s, renal function F/u de-escalation plan/LOT    Temp (24hrs), Avg:98.1 F (36.7 C), Min:97.6 F (36.4 C), Max:98.6 F (37 C)  Recent Labs  Lab 07/12/19 0558 07/15/19 0652 07/15/19 1028 07/16/19 0253 07/16/19 0941 07/17/19 0812  WBC 12.9* 12.9*  --  15.1*  --  14.0*  CREATININE 1.05 1.39*  --  1.43*  --  1.41*  LATICACIDVEN  --  1.2 1.5  --  1.9  --     Estimated Creatinine Clearance: 51.5 mL/min (A) (by C-G formula based on SCr of 1.41 mg/dL (H)).    No Known Allergies   Sherren Kerns, PharmD PGY1 Acute Care Pharmacy Resident Please check AMION for all [place] contact numbers Clinical Pharmacist 07/17/2019 10:22 AM

## 2019-07-17 NOTE — Progress Notes (Addendum)
[facility] Progress Note  Patient: Levi Pruitt    [id number]  DOA: 07/15/2019     Date of Service: the patient was seen and examined on 07/17/2019  Chief complain  Shortness of breath   Brief hospital course: Sequan Kuo a 74 y.o.malewithhistory of recent CVA with right-sided hemiparesis dysphagia requiring PEG tube placement at that time patient also had a inferior wall MI treated medically presently on aspirin statins and Coreg was discharged to rehab on 27 January 7 days ago and about 3 days ago cardiology was reconsulted because patient was getting more short of breath and at that time Lasix was restarted. This midnight patient became more short of breath chest x-ray was showing worsening infiltrates and patient was given Lasix 40 mg IV and hospitalist was consulted. On my exam patient is short of breath requiring 100% nonrebreather. Following commands but appears confused. Is not febrile blood pressures are 120 x 60 pulse is around 60/min. Given the worsening with respiratory status patient transferred back to hospital section. Patient currently suffering from acute hypoxic respiratory failure from aspiration pneumonia.  Currently further plan is continue Antibiotics and engage with the family regarding goals of care.  Assessment and Plan: 1.  Acute hypoxic respiratory failure. Aspiration pneumonia. Sepsis. Patient remains at risk for recurrent aspiration. We will resume tube feeding. MRSA PCR negative discontinue vancomycin. Continue with IV Zosyn for now. Low threshold to transition to Unasyn. Patient received IV Lasix will continue with oral Lasix. Monitor.  2.  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 ASA and plavix DAPT for 3 months and then ASA alone, lipitor  Continue home medications as well as antiplatelet and statins.  3.  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 Holding beta-blocker for now due to sepsis. Echocardiogram 1/15 EF 30-35%, grade III diastolic dysfunction.  On lasix hold betablocker   4. CKD II Hypokalemia  Renal function for now at baseline  Monitor while being diuresed and dealing with sepsis K Replaced   5. LFT elevation  Likely congestion vs sepsis related injury Stable  On lipitor due to acute stroke.  If LFT worsens may need to adjust dose   6. 8 mm parotid neoplasm  Recommend follow-up with ENT.  7. 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  Chronic occlusion of the right PCA.  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  Vascular surgery follow up.  Avoid low BP  8. Goals of care Discussed with wife that the patient is suffering from multiorgan failure. Patient remains at high risk for recurrent aspiration as well as poor prognosis down the road given his significant deconditioning and weakness. Wife is hopeful for recovery adequate enough for rehab.  Will monitor. Recommended DNR given pt's poor prognosis.   Diet: NPO on tube feed DVT Prophylaxis: Subcutaneous Heparin    Advance goals of care discussion: Limited code  Family Communication: family was present at bedside, at the time of interview.  The pt provided permission to discuss medical plan with the family. Opportunity was given to ask question and all questions were answered satisfactorily.   Disposition:  Pt is from CIR, admitted with sepsis and pneumonia, still has hypoxia,  which precludes a safe discharge. Discharge to be determined, when medically ready.  Subjective: more tired and lethargic, no acute complains, no fever, overnight needed lasix and  breathing treatment due to shortness of breath   Physical Exam: General: lethargic not oriented to time, place, and person.  Appear in moderate distress, affect flat in affect Eyes: PERRL ENT: Oral Mucosa Clear, dry  Neck: difficult to assess  JVD,  Cardiovascular: S1 and S2 Present, no Murmur,  Respiratory: increased respiratory effort, Bilateral Air entry equal and Decreased, bilateral  Crackles, no wheezes Abdomen: Bowel Sound present, Soft and no tenderness,  Skin: no rash Extremities: no Pedal edema, no calf tenderness Neurologic: without any new focal findings  Gait not checked due to patient safety concerns  Vitals:   07/17/19 0358 07/17/19 0400 07/17/19 0707 07/17/19 0811  BP: 121/76 121/76  (!) 115/95  Pulse: 66 65 62 (!) 58  Resp: (!) 27 (!) 26 20 (!) 24  Temp: 98.2 F (36.8 C)   98.4 F (36.9 C)  TempSrc: Axillary   Axillary  SpO2: 99% 98% 97% 97%    Intake/Output Summary (Last 24 hours) at 07/17/2019 1120 Last data filed at 07/17/2019 0541 Gross per 24 hour  Intake --  Output 1450 ml  Net -1450 ml   There were no vitals filed for this visit.  Data Reviewed: I have personally reviewed and interpreted daily labs, tele strips, imagings as discussed above. I reviewed all nursing notes, pharmacy notes, vitals, pertinent old records I have discussed plan of care as described above with RN and patient/family.  CBC: Recent Labs  Lab 07/12/19 0558 07/15/19 0652 07/16/19 0253 07/17/19 0812  WBC 12.9* 12.9* 15.1* 14.0*  NEUTROABS  --  9.7*  --  10.0*  HGB 14.6 15.6 13.4 13.8  HCT 45.9 48.2 40.9 42.8  MCV 96.4 96.2 94.5 94.5  PLT 277 238 276 [id number]   Basic Metabolic Panel: Recent Labs  Lab 07/12/19 0558 07/15/19 0652 07/16/19 0253 07/17/19 0812  NA 141 143 142 142  K 4.3 5.3* 5.3* 3.3*  CL 101 95* 98 96*  CO2 29 37* 28 34*  GLUCOSE 128* 80 86 176*  BUN 22 36* 37* 34*  CREATININE 1.05 1.39* 1.43* 1.41*  CALCIUM 8.6* 8.7* 8.4* 8.5*  MG  --   --   --  2.5*   PHOS  --   --   --  3.6    Studies: No results found.  Scheduled Meds: . aspirin  324 mg Per Tube Daily  . atorvastatin  80 mg Per Tube q1800  . chlorhexidine  15 mL Mouth Rinse BID  . clopidogrel  75 mg Per Tube Daily  . enoxaparin (LOVENOX) injection  40 mg Subcutaneous Q24H  . feeding supplement (PRO-STAT SUGAR FREE 64)  30 mL Oral BID  . free water  100 mL Per Tube Q8H  . furosemide  40 mg Oral Daily  . insulin aspart  0-9 Units Subcutaneous Q4H  . mouth rinse  15 mL Mouth Rinse q12n4p  . methylphenidate  5 mg Per Tube BID WC  . potassium chloride  40 mEq Per Tube Once   Continuous Infusions: . ampicillin-sulbactam (UNASYN) IV    . feeding supplement (JEVITY 1.5 CAL/FIBER) 1,000 mL (07/16/19 1220)   PRN Meds: acetaminophen **OR** acetaminophen, ipratropium-albuterol, Melatonin, ondansetron **OR** ondansetron (ZOFRAN) IV  Time spent: 35 minutes  Author: Berle Mull, MD [facility] 07/17/2019 11:20 AM  To reach On-call, see care teams to locate the  attending and reach out to them via [email or web address]. If 7PM-7AM, please contact night-coverage If you still have difficulty reaching the attending provider, please page the [facility] (Director on Call) for [facility] on amion for assistance.

## 2019-07-18 DIAGNOSIS — R627 Adult failure to thrive: Secondary | ICD-10-CM

## 2019-07-18 DIAGNOSIS — R1310 Dysphagia, unspecified: Secondary | ICD-10-CM

## 2019-07-18 LAB — BASIC METABOLIC PANEL
Anion gap: 12 (ref 5–15)
BUN: 33 mg/dL — ABNORMAL HIGH (ref 8–23)
CO2: 34 mmol/L — ABNORMAL HIGH (ref 22–32)
Calcium: 8.3 mg/dL — ABNORMAL LOW (ref 8.9–10.3)
Chloride: 100 mmol/L (ref 98–111)
Creatinine, Ser: 1.26 mg/dL — ABNORMAL HIGH (ref 0.61–1.24)
GFR calc Af Amer: >60 mL/min (ref >60)
GFR calc non Af Amer: 56 mL/min — ABNORMAL LOW (ref >60)
Glucose, Bld: 168 mg/dL — ABNORMAL HIGH (ref 70–99)
Potassium: 3.6 mmol/L (ref 3.5–5.1)
Sodium: 146 mmol/L — ABNORMAL HIGH (ref 135–145)

## 2019-07-18 LAB — HEPATIC FUNCTION PANEL
ALT: 144 U/L — ABNORMAL HIGH (ref 0–44)
AST: 88 U/L — ABNORMAL HIGH (ref 15–41)
Albumin: 2.5 g/dL — ABNORMAL LOW (ref 3.5–5.0)
Alkaline Phosphatase: 121 U/L (ref 38–126)
Bilirubin, Direct: 0.1 mg/dL (ref 0.0–0.2)
Indirect Bilirubin: 0.5 mg/dL (ref 0.3–0.9)
Total Bilirubin: 0.6 mg/dL (ref 0.3–1.2)
Total Protein: 6.1 g/dL — ABNORMAL LOW (ref 6.5–8.1)

## 2019-07-18 LAB — CBC
HCT: 43.4 % (ref 39.0–52.0)
Hemoglobin: 13.6 g/dL (ref 13.0–17.0)
MCH: 30.5 pg (ref 26.0–34.0)
MCHC: 31.3 g/dL (ref 30.0–36.0)
MCV: 97.3 fL (ref 80.0–100.0)
Platelets: 331 10*3/uL (ref 150–400)
RBC: 4.46 MIL/uL (ref 4.22–5.81)
RDW: 13.7 % (ref 11.5–15.5)
WBC: 13.5 10*3/uL — ABNORMAL HIGH (ref 4.0–10.5)
nRBC: 0 % (ref 0.0–0.2)

## 2019-07-18 LAB — GLUCOSE, CAPILLARY
Glucose-Capillary: 169 mg/dL — ABNORMAL HIGH (ref 70–99)
Glucose-Capillary: 142 mg/dL — ABNORMAL HIGH (ref 70–99)
Glucose-Capillary: 101 mg/dL — ABNORMAL HIGH (ref 70–99)
Glucose-Capillary: 149 mg/dL — ABNORMAL HIGH (ref 70–99)
Glucose-Capillary: 135 mg/dL — ABNORMAL HIGH (ref 70–99)
Glucose-Capillary: 135 mg/dL — ABNORMAL HIGH (ref 70–99)

## 2019-07-18 LAB — LACTIC ACID, PLASMA: Lactic Acid, Venous: 1.2 mmol/L (ref 0.5–1.9)

## 2019-07-18 LAB — MAGNESIUM: Magnesium: 2.4 mg/dL (ref 1.7–2.4)

## 2019-07-18 MED ORDER — FREE WATER
200.0000 mL | Freq: Three times a day (TID) | Status: DC
  Administered 2019-07-18 – 2019-07-20 (×7): 200 mL

## 2019-07-18 NOTE — Progress Notes (Signed)
Patient ID: Mosha Punches, male   DOB: December 31, 1945, 74 y.o.   MRN: [id number]  This NP visited patient at the bedside as a follow up for  Palliative  Medicine  needs and emotional support.    Patient is confused, non verbal and unable to follow commands.  Recent respiratory distress likely 2/2 to aspiration and patient was transferred from CIR to med-surg unit  I spoke with  SO/[name] Dorsett/[name], for continued conversation regarding current medical situation.  Again we discussed the patient's ongoing high risk for decompensation secondary to his multiple comorbidities.  [name] was able to visit with Mr Loo and she states, "I don't think he is doing too well"  I verbalized that this was my concern for [name] initially after his significant stroke, understanding likely  trajectories for patients when they fail to thrive.  We discussed the difference between an aggressive medical intervention path and a palliative comfort path.  We discussed the concept of quality over quantity of life.  I again raised awareness to the patient's documented declaration of a desire for a natural death   I continue to share my concern for long term poor prognosis and meaningful recovery.  [name] understands the seriousness of the current medical situation and the likelihood for meaningful recovery, however she also is trying to balance the feelings and wishes of Mr. Ladue biological family.  [name] is the documented healthcare power of attorney but she is not legally married to Mr. Lofland.  She is diligent in including [name]'s family and decisions, but ultimately wants to honor his wishes  Plan of Care: -Limited code -no intubation - open to all offered and available medical interventions to prolong life.  -oning decisions regarding GOCs  depending on outcomes  Discussed with [name] the importance of continued conversation with family and the  medical providers regarding overall plan of care and  treatment options,  ensuring decisions are within the context of the patients values and GOCs.   Education and emotional support offered  Total time spent on the unit was 35  minutes  Greater than 50% of the time was spent in counseling and coordination of care  [name] NP  Palliative Medicine Team Team Phone # [phone] Pager [phone]

## 2019-07-18 NOTE — Progress Notes (Signed)
[facility] Progress Note  Patient: Levi Pruitt    [id number]  DOA: 07/15/2019     Date of Service: the patient was seen and examined on 07/18/2019  Chief complain  Shortness of breath   Brief hospital course: Loyde Rzepecki a 74 y.o.malewithhistory of recent CVA with right-sided hemiparesis dysphagia requiring PEG tube placement at that time patient also had a inferior wall MI treated medically presently on aspirin statins and Coreg was discharged to rehab on 27 January 7 days ago and about 3 days ago cardiology was reconsulted because patient was getting more short of breath and at that time Lasix was restarted. This midnight patient became more short of breath chest x-ray was showing worsening infiltrates and patient was given Lasix 40 mg IV and hospitalist was consulted. On my exam patient is short of breath requiring 100% nonrebreather. Following commands but appears confused. Is not febrile blood pressures are 120 x 60 pulse is around 60/min. Given the worsening with respiratory status patient transferred back to hospital section. Patient currently suffering from acute hypoxic respiratory failure from aspiration pneumonia.  Currently further plan is continue Antibiotics and engage with the family regarding goals of care.  Assessment and Plan: 1.  Acute hypoxic respiratory failure. Aspiration pneumonia. Sepsis. Patient remains at risk for recurrent aspiration. We will resume tube feeding. MRSA PCR negative discontinue vancomycin. Patient was on IV Zosyn, transition with IV Unasyn for now. Patient received IV Lasix will continue with oral Lasix. Monitor.  2.  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 ASA and plavix DAPT for 3 months and then ASA alone, lipitor  Continue home medications as well as antiplatelet and statins.  3.  Recent Acute  inferior STEMI Acute on Chronic systolic, diastolic heart failure with pulmonary edema Medically managed January 2021 Continue aspirin Plavix and statins. Holding beta-blocker for now due to sepsis. Echocardiogram 1/15 EF 30-35%, grade III diastolic dysfunction.  On lasix hold betablocker   4. CKD II Hypokalemia Mild hypernatremia Renal function for now at baseline  Monitor while being diuresed and dealing with sepsis K Replaced Increase free water  5. LFT elevation  Likely congestion vs sepsis related injury Stable  On lipitor due to acute stroke.  Now improving finally.  6. 8 mm parotid neoplasm  Recommend follow-up with ENT.  7. 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  Chronic occlusion of the right PCA.  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  Vascular surgery follow up.  Avoid low BP  8. Goals of care Discussed with wife that the patient is suffering from multiorgan failure. Patient remains at high risk for recurrent aspiration as well as poor prognosis down the road given his significant deconditioning and weakness. Wife is hopeful for recovery adequate enough for rehab.  Will monitor. Recommended DNR given pt's poor prognosis.   Diet: NPO on tube feed DVT Prophylaxis: Subcutaneous Heparin    Advance goals of care discussion: Limited code  Family Communication: No family was present at bedside, at the time of interview.  Discussed with wife at bedside on 07/17/2019. More confused today.  Disposition:  Pt is from CIR, admitted with sepsis and pneumonia, still has hypoxia, which precludes a safe discharge. Discharge to be determined, when medically ready.  Subjective: More confused.  No nausea no vomiting.  Breathing okay.  Following commands.  Physical Exam: General: Awake not oriented to time, place, and person.  Appear  in mild distress, affect flat in affect Eyes: PERRL ENT: Oral Mucosa Clear, dry  Neck: difficult to assess  JVD,  Cardiovascular: S1 and S2 Present, no Murmur,  Respiratory: increased respiratory effort, Bilateral Air entry equal and Decreased, bilateral  Crackles, no wheezes Abdomen: Bowel Sound present, Soft and no tenderness,  Skin: no rash Extremities: no Pedal edema, no calf tenderness Neurologic: without any new focal findings  Gait not checked due to patient safety concerns  Vitals:   07/18/19 0500 07/18/19 0856 07/18/19 1132 07/18/19 1622  BP:  (!) 153/65 (!) 106/57 109/71  Pulse:  77 68 69  Resp:  (!) 26 (!) 21 (!) 22  Temp:  (!) 97.5 F (36.4 C) 97.8 F (36.6 C) 98.4 F (36.9 C)  TempSrc:  Axillary Axillary Oral  SpO2:  95% 99% 96%  Weight: 97.3 kg       Intake/Output Summary (Last 24 hours) at 07/18/2019 1835 Last data filed at 07/18/2019 1625 Gross per 24 hour  Intake --  Output 950 ml  Net -950 ml   Filed Weights   07/18/19 0500  Weight: 97.3 kg    Data Reviewed: I have personally reviewed and interpreted daily labs, tele strips, imagings as discussed above. I reviewed all nursing notes, pharmacy notes, vitals, pertinent old records I have discussed plan of care as described above with RN and patient/family.  CBC: Recent Labs  Lab 07/12/19 0558 07/15/19 0652 07/16/19 0253 07/17/19 0812 07/18/19 0320  WBC 12.9* 12.9* 15.1* 14.0* 13.5*  NEUTROABS  --  9.7*  --  10.0*  --   HGB 14.6 15.6 13.4 13.8 13.6  HCT 45.9 48.2 40.9 42.8 43.4  MCV 96.4 96.2 94.5 94.5 97.3  PLT 277 238 276 321 [id number]   Basic Metabolic Panel: Recent Labs  Lab 07/12/19 0558 07/15/19 0652 07/16/19 0253 07/17/19 0812 07/18/19 0320  NA 141 143 142 142 146*  K 4.3 5.3* 5.3* 3.3* 3.6  CL 101 95* 98 96* 100  CO2 29 37* 28 34* 34*  GLUCOSE 128* 80 86 176* 168*  BUN 22 36* 37* 34* 33*  CREATININE 1.05 1.39* 1.43* 1.41* 1.26*  CALCIUM 8.6* 8.7* 8.4* 8.5* 8.3*  MG  --   --   --   2.5* 2.4  PHOS  --   --   --  3.6  --     Studies: No results found.  Scheduled Meds: . aspirin  324 mg Per Tube Daily  . atorvastatin  80 mg Per Tube q1800  . chlorhexidine  15 mL Mouth Rinse BID  . clopidogrel  75 mg Per Tube Daily  . enoxaparin (LOVENOX) injection  40 mg Subcutaneous Q24H  . feeding supplement (PRO-STAT SUGAR FREE 64)  30 mL Oral BID  . free water  200 mL Per Tube Q8H  . furosemide  40 mg Oral Daily  . insulin aspart  0-9 Units Subcutaneous Q4H  . labetalol  10 mg Intravenous Once  . mouth rinse  15 mL Mouth Rinse q12n4p  . methylphenidate  5 mg Per Tube BID WC   Continuous Infusions: . ampicillin-sulbactam (UNASYN) IV 1.5 g (07/18/19 1735)  . feeding supplement (JEVITY 1.5 CAL/FIBER) 1,000 mL (07/18/19 0937)   PRN Meds: acetaminophen **OR** acetaminophen, ipratropium-albuterol, Melatonin, ondansetron **OR** ondansetron (ZOFRAN) IV  Time spent: 35 minutes  Author: Berle Mull, MD [facility] 07/18/2019 6:35 PM  To reach On-call, see care teams to locate the attending and reach out to them via [email or web address]. If 7PM-7AM, please contact night-coverage If you still have difficulty reaching the attending provider, please page the [facility] (Director on Call) for [facility] on amion for assistance.

## 2019-07-19 ENCOUNTER — Inpatient Hospital Stay (HOSPITAL_COMMUNITY): Payer: Medicare Other

## 2019-07-19 DIAGNOSIS — I5021 Acute systolic (congestive) heart failure: Secondary | ICD-10-CM

## 2019-07-19 DIAGNOSIS — R627 Adult failure to thrive: Secondary | ICD-10-CM

## 2019-07-19 DIAGNOSIS — J9601 Acute respiratory failure with hypoxia: Secondary | ICD-10-CM

## 2019-07-19 DIAGNOSIS — I519 Heart disease, unspecified: Secondary | ICD-10-CM

## 2019-07-19 DIAGNOSIS — G9341 Metabolic encephalopathy: Secondary | ICD-10-CM

## 2019-07-19 LAB — BASIC METABOLIC PANEL
Anion gap: 10 (ref 5–15)
BUN: 32 mg/dL — ABNORMAL HIGH (ref 8–23)
CO2: 36 mmol/L — ABNORMAL HIGH (ref 22–32)
Calcium: 8.6 mg/dL — ABNORMAL LOW (ref 8.9–10.3)
Chloride: 102 mmol/L (ref 98–111)
Creatinine, Ser: 1.06 mg/dL (ref 0.61–1.24)
GFR calc Af Amer: >60 mL/min (ref >60)
GFR calc non Af Amer: >60 mL/min (ref >60)
Glucose, Bld: 156 mg/dL — ABNORMAL HIGH (ref 70–99)
Potassium: 3.4 mmol/L — ABNORMAL LOW (ref 3.5–5.1)
Sodium: 148 mmol/L — ABNORMAL HIGH (ref 135–145)

## 2019-07-19 LAB — BLOOD GAS, ARTERIAL
Acid-Base Excess: 11.9 mmol/L — ABNORMAL HIGH (ref 0.0–2.0)
Acid-Base Excess: 14.6 mmol/L — ABNORMAL HIGH (ref 0.0–2.0)
Bicarbonate: 37.2 mmol/L — ABNORMAL HIGH (ref 20.0–28.0)
Bicarbonate: 39.5 mmol/L — ABNORMAL HIGH (ref 20.0–28.0)
Drawn by: 30136
FIO2: 44
FIO2: 50
O2 Saturation: 95.4 %
O2 Saturation: 98 %
Patient temperature: 36.8
Patient temperature: 35.9
pCO2 arterial: 60.9 mmHg — ABNORMAL HIGH (ref 32.0–48.0)
pCO2 arterial: 53.8 mmHg — ABNORMAL HIGH (ref 32.0–48.0)
pH, Arterial: 7.402 (ref 7.350–7.450)
pH, Arterial: 7.473 — ABNORMAL HIGH (ref 7.350–7.450)
pO2, Arterial: 83.4 mmHg (ref 83.0–108.0)
pO2, Arterial: 102 mmHg (ref 83.0–108.0)

## 2019-07-19 LAB — CBC
HCT: 44.8 % (ref 39.0–52.0)
Hemoglobin: 13.9 g/dL (ref 13.0–17.0)
MCH: 30.9 pg (ref 26.0–34.0)
MCHC: 31 g/dL (ref 30.0–36.0)
MCV: 99.6 fL (ref 80.0–100.0)
Platelets: 321 10*3/uL (ref 150–400)
RBC: 4.5 MIL/uL (ref 4.22–5.81)
RDW: 14 % (ref 11.5–15.5)
WBC: 11.6 10*3/uL — ABNORMAL HIGH (ref 4.0–10.5)
nRBC: 0 % (ref 0.0–0.2)

## 2019-07-19 LAB — GLUCOSE, CAPILLARY
Glucose-Capillary: 130 mg/dL — ABNORMAL HIGH (ref 70–99)
Glucose-Capillary: 146 mg/dL — ABNORMAL HIGH (ref 70–99)
Glucose-Capillary: 84 mg/dL (ref 70–99)
Glucose-Capillary: 86 mg/dL (ref 70–99)
Glucose-Capillary: 91 mg/dL (ref 70–99)
Glucose-Capillary: 95 mg/dL (ref 70–99)
Glucose-Capillary: 96 mg/dL (ref 70–99)

## 2019-07-19 LAB — HEPATIC FUNCTION PANEL
ALT: 155 U/L — ABNORMAL HIGH (ref 0–44)
AST: 95 U/L — ABNORMAL HIGH (ref 15–41)
Albumin: 2.4 g/dL — ABNORMAL LOW (ref 3.5–5.0)
Alkaline Phosphatase: 125 U/L (ref 38–126)
Bilirubin, Direct: 0.1 mg/dL (ref 0.0–0.2)
Indirect Bilirubin: 0.4 mg/dL (ref 0.3–0.9)
Total Bilirubin: 0.5 mg/dL (ref 0.3–1.2)
Total Protein: 6.1 g/dL — ABNORMAL LOW (ref 6.5–8.1)

## 2019-07-19 MED ORDER — FUROSEMIDE 10 MG/ML IJ SOLN
60.0000 mg | Freq: Once | INTRAMUSCULAR | Status: AC
  Administered 2019-07-19: 01:00:00 60 mg via INTRAVENOUS
  Filled 2019-07-19: qty 6

## 2019-07-19 MED ORDER — AMIODARONE HCL IN DEXTROSE 360-4.14 MG/200ML-% IV SOLN
60.0000 mg/h | INTRAVENOUS | Status: DC
  Administered 2019-07-19 (×3): 60 mg/h via INTRAVENOUS

## 2019-07-19 MED ORDER — AMIODARONE HCL 200 MG PO TABS
200.0000 mg | ORAL_TABLET | Freq: Two times a day (BID) | ORAL | Status: DC
  Administered 2019-07-19 – 2019-07-21 (×4): 200 mg via ORAL
  Filled 2019-07-19 (×4): qty 1

## 2019-07-19 MED ORDER — AMIODARONE LOAD VIA INFUSION
150.0000 mg | Freq: Once | INTRAVENOUS | Status: AC
  Administered 2019-07-19: 07:00:00 150 mg via INTRAVENOUS
  Filled 2019-07-19: qty 83.34

## 2019-07-19 MED ORDER — JEVITY 1.5 CAL/FIBER PO LIQD
1000.0000 mL | ORAL | Status: DC
  Administered 2019-07-19: 1000 mL
  Filled 2019-07-19: qty 1000

## 2019-07-19 MED ORDER — AMIODARONE HCL IN DEXTROSE 360-4.14 MG/200ML-% IV SOLN
30.0000 mg/h | INTRAVENOUS | Status: DC
  Administered 2019-07-19: 12:00:00 30 mg/h via INTRAVENOUS
  Filled 2019-07-19: qty 400
  Filled 2019-07-19: qty 200

## 2019-07-19 MED ORDER — AMIODARONE HCL 200 MG PO TABS: 200.0000 mg | ORAL_TABLET | Freq: Two times a day (BID) | ORAL | Status: DC

## 2019-07-19 NOTE — Progress Notes (Signed)
Pt converted to NSR at 0720.  Clyde Canterbury, RN

## 2019-07-19 NOTE — Consult Note (Addendum)
Cardiology Consultation:   Patient ID: Levi Pruitt MRN: [id number]; DOB: [birth year]  Admit date: 07/15/2019 Date of Consult: 07/19/2019  Primary Care Provider: Patient, No Pcp Per Primary Cardiologist: No primary care provider on file.  Primary Electrophysiologist:  None    Patient Profile:   Levi Pruitt is a 74 y.o. male  with recent CVA with right-sided hemiparesis, dysphagia requiring PEG tube, complicated with inferior wall, sever carotid artery diseasewho discharged to CIR last about 2 weeks ago and then transferred back to the floor from inpatient rehab due to respiratory failure. He found to have multifocal PNA likely secondary to aspiration dye to dysphagia after stroke. He is currently requiring BiPAP.  History of Present Illness:   Levi Pruitt with a hx CVA, dysphagia, inferior MI, combine systolic and diastolic HF,  sever carotid artery disease, who is being seen today for the evaluation of heart failue and Afib at the request of Dr. Earnest Conroy. Patient with history of recent hospitalization due to CVA with right-sided hemiparesis, dysphagia requiring PEG tube, complicated with inferior wall MI that managed medically. He was discharged to inpatient rehab at 1/27,  Patient had worsening of SOB and found to have pulm infiltrates.  hospitalist consulted and patient transferred back to the medical floor due to worsening of respiratory status and acute hypoxic respiratory failure. CXR showed multifocal PNA. He required NRB and started on antibiotic and also received some diuresis. His respiratory status did not improve and he then required BiPAP.  He also developed Afib with RVR with soft BP and started on Amio drip. Cardiology was consulted for heart failure and afib management.  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 Authorizing Provider  aspirin 81 MG chewable tablet Place 4 tablets (324 mg total) into feeding tube daily. 07/08/19   Dessa Phi, DO  atorvastatin (LIPITOR) 80 MG tablet Place 1 tablet (80 mg total) into feeding tube daily at 6 PM. 07/07/19   Dessa Phi, DO  carvedilol (COREG) 3.125 MG tablet Place 1 tablet (3.125 mg total) into feeding tube 2 (two) times daily with a meal. 07/07/19   Dessa Phi, DO  clopidogrel (PLAVIX) 75 MG tablet Place 1 tablet (75 mg total) into feeding tube daily. 07/07/19   Dessa Phi, DO  enoxaparin (LOVENOX) 40 MG/0.4ML injection Inject 0.4 mLs (40 mg total) into the skin daily. 07/15/19   Angiulli, Lavon Paganini, PA-C  furosemide (LASIX) 40 MG tablet Place 1 tablet (40 mg total) into feeding tube daily. 07/15/19   Angiulli, Lavon Paganini, PA-C  insulin aspart (NOVOLOG) 100 UNIT/ML injection Inject 0-9 Units into the skin every 4 (four) hours. 07/15/19   Angiulli, Lavon Paganini, PA-C  ipratropium-albuterol (DUONEB) 0.5-2.5 (3) MG/3ML SOLN Take 3 mLs by nebulization every 6 (six) hours as needed. 07/15/19   Angiulli, Lavon Paganini, PA-C  Melatonin 3 MG TABS Take 2 tablets (6 mg total) by mouth at bedtime as needed (insomnia). 07/15/19   Angiulli, Lavon Paganini, PA-C  methylphenidate (RITALIN) 5 MG tablet Take 1 tablet (5 mg total) by mouth 2 (two) times daily with breakfast and lunch. 07/15/19   Angiulli, Lavon Paganini, PA-C  Nutritional Supplements (FEEDING SUPPLEMENT, JEVITY 1.5 CAL/FIBER,)  LIQD Take 1,000 mLs by mouth continuous. 07/15/19   Angiulli, Lavon Paganini, PA-C  Water For Irrigation, Sterile (FREE WATER) SOLN Place 150 mLs into feeding tube every 6 (six) hours. 07/15/19   Angiulli, Lavon Paganini, PA-C    Inpatient Medications: Scheduled Meds:  aspirin  324 mg Per Tube Daily   atorvastatin  80 mg Per Tube q1800   chlorhexidine  15 mL Mouth Rinse BID   clopidogrel  75 mg Per Tube  Daily   enoxaparin (LOVENOX) injection  40 mg Subcutaneous Q24H   feeding supplement (PRO-STAT SUGAR FREE 64)  30 mL Oral BID   free water  200 mL Per Tube Q8H   furosemide  40 mg Oral Daily   insulin aspart  0-9 Units Subcutaneous Q4H   labetalol  10 mg Intravenous Once   mouth rinse  15 mL Mouth Rinse q12n4p   methylphenidate  5 mg Per Tube BID WC   Continuous Infusions:  amiodarone 30 mg/hr (07/19/19 1217)   ampicillin-sulbactam (UNASYN) IV 1.5 g (07/19/19 1033)   feeding supplement (JEVITY 1.5 CAL/FIBER)     PRN Meds: acetaminophen **OR** acetaminophen, ipratropium-albuterol, Melatonin, ondansetron **OR** ondansetron (ZOFRAN) IV  Allergies:   No Known Allergies  Social History:   Social History   Socioeconomic History   Marital status: Widowed    Spouse name: Not on file   Number of children: Not on file   Years of education: Not on file   Highest education level: Not on file  Occupational History   Occupation: Lawer, semi-retired.  Tobacco Use   Smoking status: Never Smoker   Smokeless tobacco: Never Used  Substance and Sexual Activity   Alcohol use: Not on file   Drug use: Not on file   Sexual activity: Not on file  Other Topics Concern   Not on file  Social History Narrative   Staying home most of the time, does not exercise.    Social Determinants of Health   Financial Resource Strain:    Difficulty of Paying Living Expenses: Not on file  Food Insecurity:    Worried About Charity fundraiser in the Last Year: Not on file   [employer] of Food in the Last Year: Not on file  Transportation Needs:    Lack of Transportation (Medical): Not on file   Lack of Transportation (Non-Medical): Not on file  Physical Activity:    Days of Exercise per Week: Not on file   Minutes of Exercise per Session: Not on file  Stress:    Feeling of Stress : Not on file  Social Connections:    Frequency of Communication with Friends and Family: Not on file   Frequency of Social  Gatherings with Friends and Family: Not on file   Attends Religious Services: Not on file   Active Member of Clubs or Organizations: Not on file   Attends Archivist Meetings: Not on file   Marital Status: Not on file  Intimate Partner Violence:    Fear of Current or Ex-Partner: Not on file   Emotionally Abused: Not on file   Physically Abused: Not on file   Sexually Abused: Not on file    Family History:    Family History  Problem Relation Age of Onset   Heart disease Father    High blood pressure Sister        is his twin     ROS:  Please see the history of present illness.  All other ROS  reviewed and negative.     Physical Exam/Data:   Vitals:   07/19/19 0814 07/19/19 1155 07/19/19 1214 07/19/19 1508  BP: 100/68 100/68 110/66 110/66  Pulse: 65  (!) 57 64  Resp: 20  (!) 21 20  Temp:   (!) 96.6 F (35.9 C)   TempSrc:   Axillary   SpO2: 99%  100% 99%  Weight:        Intake/Output Summary (Last 24 hours) at 07/19/2019 1551 Last data filed at 07/19/2019 1346 Gross per 24 hour  Intake 200 ml  Output 1675 ml  Net -1475 ml   Last 3 Weights 07/18/2019 07/14/2019 07/13/2019  Weight (lbs) 214 lb 8 oz 196 lb 3.4 oz 202 lb 13.2 oz  Weight (kg) 97.297 kg 89 kg 92 kg     Body mass index is 31.68 kg/m.  General:  Ill Appearing, is on BiPAP. Somnolent, opens his eyes with verbak stimuli and answers questions in 1-2 words.  in no acute distress Neck: no JVD Vascular: No carotid bruits Cardiac:  normal S1, S2; RRR; no murmur  Lungs:  clear to auscultation bilaterally, no wheezing, rhonchi or rales  Abd: soft, nontender, no hepatomegaly  Ext: no edema Musculoskeletal:  No deformities, BUE and BLE strength normal and equal Skin: warm and dry  Neuro:  Rt side residual weakness Psych:  Normal affect   EKG:  The EKG was personally reviewed and demonstrates:  NSR Telemetry:  Telemetry was personally reviewed and demonstrates:  Afib>NSR with NSVT  Relevant CV  Studies:  (Echo from recent admission)06/25/2019: IMPRESSIONS     1. Left ventricular ejection fraction, by visual estimation, is 30 to  35%. The left ventricle has moderate to severely decreased function. There  is mildly increased left ventricular hypertrophy.   2. The left ventricle demonstrates regional wall motion abnormalities.  Inferior/inferoseptal/apical akinesis   3. Moderately dilated left ventricular internal cavity size.   4. Left ventricular diastolic parameters are consistent with Grade III  diastolic dysfunction (restrictive).   5. Elevated left atrial pressure.   6. Global right ventricle has normal systolic function.The right  ventricular size is normal.   7. The mitral valve is normal in structure. Trivial mitral valve  regurgitation.   8. The tricuspid valve is normal in structure.   9. The aortic valve is tricuspid. Aortic valve regurgitation is not  visualized. No evidence of aortic valve sclerosis or stenosis.  10. The pulmonic valve was not well visualized. Pulmonic valve  regurgitation is not visualized.  11. TR signal is inadequate for assessing pulmonary artery systolic  pressure.  12. The inferior vena cava is normal in size with greater than 50%  respiratory variability, suggesting right atrial pressure of 3 mmHg.   Laboratory Data:  High Sensitivity Troponin:   Recent Labs  Lab 07/15/19 0652 07/15/19 1028  TROPONINIHS 545* 493*     Chemistry Recent Labs  Lab 07/17/19 0812 07/18/19 0320 07/19/19 0337  NA 142 146* 148*  K 3.3* 3.6 3.4*  CL 96* 100 102  CO2 34* 34* 36*  GLUCOSE 176* 168* 156*  BUN 34* 33* 32*  CREATININE 1.41* 1.26* 1.06  CALCIUM 8.5* 8.3* 8.6*  GFRNONAA 49* 56* >60  GFRAA 57* >60 >60  ANIONGAP 12 12 10     Recent Labs  Lab 07/17/19 0812 07/18/19 0320 07/19/19 0337  PROT 6.1* 6.1* 6.1*  ALBUMIN 2.5* 2.5* 2.4*  AST 104* 88* 95*  ALT 155* 144* 155*  ALKPHOS 129* 121 125  BILITOT  0.9 0.6 0.5    Hematology Recent Labs  Lab 07/17/19 0812 07/18/19 0320 07/19/19 0337  WBC 14.0* 13.5* 11.6*  RBC 4.53 4.46 4.50  HGB 13.8 13.6 13.9  HCT 42.8 43.4 44.8  MCV 94.5 97.3 99.6  MCH 30.5 30.5 30.9  MCHC 32.2 31.3 31.0  RDW 13.9 13.7 14.0  PLT 321 331 321   BNP Recent Labs  Lab 07/15/19 0652  BNP 893.6*    DDimer  Recent Labs  Lab 07/15/19 [id number]  DDIMER 3.49*     Radiology/Studies:  DG CHEST PORT 1 VIEW  Result Date: 07/19/2019 CLINICAL DATA:  Respiratory failure. EXAM: PORTABLE CHEST 1 VIEW COMPARISON:  Chest x-ray dated July 16, 2019. FINDINGS: Stable cardiomediastinal silhouette. Improving consolidation in the right upper lobe. Additional patchy airspace disease in the left upper and mid lung has mildly improved as well. No pneumothorax or large pleural effusion. No acute osseous abnormality. IMPRESSION: Improving multifocal pneumonia. Electronically Signed   By: Titus Dubin M.D.   On: 07/19/2019 11:40   DG CHEST PORT 1 VIEW  Result Date: 07/16/2019 CLINICAL DATA:  Short of breath, stroke EXAM: PORTABLE CHEST 1 VIEW COMPARISON:  07/15/2019, 07/11/2019 FINDINGS: Single frontal view of the chest demonstrates progressive dense consolidation within the right upper lobe. Persistent multifocal airspace disease elsewhere throughout the lungs, greatest in the left perihilar region. No large effusion or pneumothorax. Cardiac silhouette is stable. IMPRESSION: 1. Findings consistent with multifocal pneumonia, with increasing consolidation right upper lobe. Electronically Signed   By: Randa Ngo M.D.   On: 07/16/2019 08:51         Assessment and Plan:   Combined systolic and diastolic HF: Recent Acute inferior STEMI with acute on Chronic systolic, diastolic heart failure with pulmonary edema.   Echo from recent admission (1/15) with EF 30-35% LV with regional wall motion abnormalities and inferior/inferoseptal/apical akinesis. Grade 3 DD (restrictive)  Weight up from  recent hospitalization but no volume overload on exam. Patient received IV Lasix on admission but now back on oral Lasix. Euvolumic on exam.  Kidney function stable. BNP was ~900 on arrival. Not repeated.  -Recommending to continue PO Lasix given volume status is normal  -Holding coreg and other BB given soft BP and HR at 50s -Holding ACEI /ARB and spironolactone with soft BP -Monitor BNP -Strict I/Os -Weight daily -Cardiology team will follow up -BMP daily and replace electrolytes as needed  -Recommending palliative care discussion  Acute hypoxic respiratory failure likely secondary to aspiration pneumonia: Patient with recent CVA and residual dysphagia and high risk for recurrent aspirations. He has been on IV Ab, now requiring BiPAP.  ABG with metabolic alkalosis and respiratory acidosis (Ph 7.47, PCO2 53, HCO3 39.5)  -CXR today showed some improvement of multifocal PNA however he requires 100% BiPAP.  -He is Npo, and on tube feeding -Continue antibiotic ( Unasyn) per primary -Patient is DNI but family wants CPR and shock if coded. Recommending goal of care discussion and palliative care involvement given multiple co morbidities, CVA, dysphagia and being on peg tubet, HF with EF 30-35%, recent MI,  sever carotid artery disease  Afib with RVR On Amiodarone and now in sinus rythm. -No BB due to soft BP and rate is at 50s. -CHADsVAs score is 5. No anticoagulation recommended though as risk>benefit with recent stroke and other co morbidities. -Recommending to switch IV Amiodarone to PO as below: PO Amiodarone 200 mg BID x 1 week then 200 mg PO QD x 1week and then  discontinue -Continue cardiac monitoring   Recent CVA with residual weakness and dysphagia: -Tube feeding per primary -Continue ASA and Plavix and statin -on ASA, Plavix, statin and lasix -Will need goal of care discussion with family  Sever carotid artery stenosis, PCA, vertebral stenosis: Patient has Occlusions of  the distal Left V4 and Left P1. Chronic occlusion of the right PCA. 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 Per primary, vascular surgery follow up. Avoid low BP. Permissive HTN -Continue ASA, Plavix and statin  For questions or updates, please contact [place] Please consult [email or web address] for contact info under     Signed, Dewayne Hatch, MD  07/19/2019 3:51 PM  Agree with note by Dr. Myrtie Hawk  Unfortunate's severely ill 74 year old Caucasian male who we are asked to see again in consultation because of heart failure.  His daughter is in the room with him today.  He was admitted last month with a stroke and STEMI.  He was not taken to the Cath Lab because of his neurologic status and comorbidities.  His EF was in the 30% range.  He was ultimately transferred to [facility].  He has a PEG tube in place for feeding and was getting PT and OT.  He is hemiparetic and has dysarthria as well.  He was transferred back to a telemetry bed because of progressive hypoxia 4 days ago thought to be related to aspiration pneumonia plus or minus superimposed heart failure.  He is wearing BiPAP currently.  He is a DNI per the family's wishes.  He was diuresed 3-1/2 L on IV Lasix and currently is on oral Lasix.  He did have a brief episode of PAF with RVR which converted with IV amiodarone.  On exam he is minimally responsive.  His lungs are clear his heart is regular rate and rhythm.  He has no significant peripheral edema.  At this point, I recommend fairly conservative care including oral diuretics.  His heart rate is relatively slow and therefore I would hold beta-blocker and ACE inhibitor.  His head CTA did show high-grade carotid bulb disease on the right with string sign in bilateral carotid disease at the siphon.  Overall I think is outlook is fairly guarded.  I would highly  recommend discussion with family regards to goals of care.  Lorretta Harp, M.D., [place], [facility], [place] [address] [place], [place]  [id number]  [phone] 07/19/2019 4:47 PM

## 2019-07-19 NOTE — Progress Notes (Signed)
Patient taking off bipap and placed on a 4L  at this time and is tolerating well.

## 2019-07-19 NOTE — Progress Notes (Signed)
PT Cancellation Note  Patient Details Name: Levi Pruitt MRN: [id number] DOB: 1945-09-06   Cancelled Treatment:    Reason Eval/Treat Not Completed: Medical issues which prohibited therapy patient had rapid response event last night and has transitioned to BiPAP, rapid response follow up notes indicate he is still fairly lethargic today. Will follow acutely and initiate eval when patient is medically appropriate.    Windell Norfolk, DPT, PN1   Supplemental Physical Therapist [facility]    Pager [phone] Acute Rehab Office [phone]

## 2019-07-19 NOTE — Progress Notes (Signed)
Pt requiring BiPap. PEG tube feeding stopped at this time.  Clyde Canterbury, RN

## 2019-07-19 NOTE — Progress Notes (Signed)
Pt HR was in 120-150's. When entered the room pt have had big BM, oriented to self and able to answer simple answer. 12 lead EKG showed Afib/RVR. On call NP Bodenheimer called and notified of the event. Order received for amiodarone bolus and drip. Pt breathing even and unlabored in bi-pap. Will continue to monitor.

## 2019-07-19 NOTE — Progress Notes (Signed)
OT Cancellation Note  Patient Details Name: Rajah Whitby MRN: [id number] DOB: 10-23-1945   Cancelled Treatment:    Reason Eval/Treat Not Completed: Patient at procedure or test/ unavailable(CT. Will return as schedule allows.)  Graf, OTR/L Acute Rehab Pager: [phone] Office: [phone] 07/19/2019, 4:37 PM

## 2019-07-19 NOTE — Significant Event (Signed)
Rapid Response Event Note  Increased WOB  Overview: Time Called: 0030 Arrival Time: 0030 Event Type: Respiratory  Initial Focused Assessment:  On arrival sitting in bed alert oriented to self able to answer simple questions, pt c/o of SOB. Pt receiving breathing treatment on arrival, pt using accessory muscles for breathing, coarse crackles auscultated throughout. RR 35, HR 75, SpO2 93%, BP 116/70.    Interventions:  ABG 7.4/60.9/83.4/11.9/37.2 Bipap 60mg  Lasix IV ordered by NP  Plan of Care (if not transferred): Pt WOB of breathing improved on Bipap, pt resting. Give lasix, monitor out put, continue Bipap. Please call RRT if further assistance is needed.  Event Summary: Name of Physician Notified: Bodenheimer NP at 0047    at    Outcome: Stayed in room and stabalized  Event End Time: [place]

## 2019-07-19 NOTE — Progress Notes (Addendum)
PROGRESS NOTE    Levi Pruitt  PBD:[id number]  DOB: 1946-05-20  PCP: Patient, No Pcp Per 74 y/o male with h/o recent CVA with right sided hemiparesis/dysphagia requiring PEG placement and complicated by inferior wall MI --discharged to acute rehab on 1/27--seen in OP cardiology clinic for c/o dyspnea prompting resumption of diuretics. Patient's condition however worsened on the night of admission requiring100% NRBM,with CXR showing worsening infiltrates, patient was given Lasix 40 mg IV and hospitalist was consulted for readmission to acute care.  Subjective: Patient had respiratory distress overnight,  placed on Bipap,received 9m of IV Lasix. Also went into A-Fib w RVR last night, BP soft at 99/75-started on Amio gtt. remains minimally responsive this morning.  Daughter at bedside and concerned that he has had waxing and waning mental status.  PEG tubes held overnight.  Objective: Vitals:   07/19/19 0425 07/19/19 0641 07/19/19 0733 07/19/19 0814  BP: (!) 99/57 97/75 94/67  100/68  Pulse: 86 (!) 120 65 65  Resp: (!) 21 15 (!) 23 20  Temp: 98 F (36.7 C)  97.8 F (36.6 C)   TempSrc: Axillary  Axillary   SpO2: 98% 99% 97% 99%  Weight:        Intake/Output Summary (Last 24 hours) at 07/19/2019 04128Last data filed at 07/19/2019 0424 Gross per 24 hour  Intake 200 ml  Output 1650 ml  Net -1450 ml   Filed Weights   07/18/19 0500  Weight: 97.3 kg    Physical Examination:  General exam: Appears lethargic and on BiPAP, head end of the bed at 80 degrees Respiratory system: Clear to auscultation, reduced breath sounds at bases, respiratory effort normal. Cardiovascular system: S1 & S2 heard, RRR. No JVD, murmurs.  Trace pedal edema. Gastrointestinal system: S/p PEG, abdomen is obese, soft and nontender. Normal bowel sounds heard. Central nervous system: Not very alert, opens eyes at times but overall appears lethargic, no new focal neurological deficits. Extremities: Puffy arms, trace  LE edema  Skin: No rashes, lesions or ulcers Psychiatry: Judgement and insight appear impaired.   Data Reviewed: I have personally reviewed following labs and imaging studies  CBC: Recent Labs  Lab 07/15/19 0652 07/16/19 0253 07/17/19 0812 07/18/19 0320 07/19/19 0337  WBC 12.9* 15.1* 14.0* 13.5* 11.6*  NEUTROABS 9.7*  --  10.0*  --   --   HGB 15.6 13.4 13.8 13.6 13.9  HCT 48.2 40.9 42.8 43.4 44.8  MCV 96.2 94.5 94.5 97.3 99.6  PLT 238 276 321 331 3208  Basic Metabolic Panel: Recent Labs  Lab 07/15/19 0652 07/16/19 0253 07/17/19 0812 07/18/19 0320 07/19/19 0337  NA 143 142 142 146* 148*  K 5.3* 5.3* 3.3* 3.6 3.4*  CL 95* 98 96* 100 102  CO2 37* 28 34* 34* 36*  GLUCOSE 80 86 176* 168* 156*  BUN 36* 37* 34* 33* 32*  CREATININE 1.39* 1.43* 1.41* 1.26* 1.06  CALCIUM 8.7* 8.4* 8.5* 8.3* 8.6*  MG  --   --  2.5* 2.4  --   PHOS  --   --  3.6  --   --    GFR: Estimated Creatinine Clearance: 71.4 mL/min (by C-G formula based on SCr of 1.06 mg/dL). Liver Function Tests: Recent Labs  Lab 07/15/19 0138802/05/21 0253 07/17/19 0812 07/18/19 0320 07/19/19 0337  AST 108* 101* 104* 88* 95*  ALT 154* 138* 155* 144* 155*  ALKPHOS 129* 102 129* 121 125  BILITOT 1.2 1.0 0.9 0.6 0.5  PROT 6.5 5.6* 6.1* 6.1*  6.1*  ALBUMIN 2.7* 2.4* 2.5* 2.5* 2.4*   No results for input(s): LIPASE, AMYLASE in the last 168 hours. Recent Labs  Lab 07/16/19 0941  AMMONIA 25   Coagulation Profile: No results for input(s): INR, PROTIME in the last 168 hours. Cardiac Enzymes: No results for input(s): CKTOTAL, CKMB, CKMBINDEX, TROPONINI in the last 168 hours. BNP (last 3 results) No results for input(s): PROBNP in the last 8760 hours. HbA1C: No results for input(s): HGBA1C in the last 72 hours. CBG: Recent Labs  Lab 07/18/19 1129 07/18/19 1620 07/18/19 2032 07/19/19 0013 07/19/19 0419  GLUCAP 101* 135* 135* 130* 146*   Lipid Profile: No results for input(s): CHOL, HDL, LDLCALC, TRIG,  CHOLHDL, LDLDIRECT in the last 72 hours. Thyroid Function Tests: Recent Labs    07/16/19 0941  TSH 2.262  FREET4 0.97   Anemia Panel: No results for input(s): VITAMINB12, FOLATE, FERRITIN, TIBC, IRON, RETICCTPCT in the last 72 hours. Sepsis Labs: Recent Labs  Lab 07/15/19 5364 07/15/19 1028 07/16/19 0941 07/18/19 0320  PROCALCITON 0.17  --  0.12  --   LATICACIDVEN 1.2 1.5 1.9 1.2    Recent Results (from the past 240 hour(s))  C difficile quick scan w PCR reflex     Status: None   Collection Time: 07/09/19  5:57 PM   Specimen: STOOL  Result Value Ref Range Status   C Diff antigen NEGATIVE NEGATIVE Final   C Diff toxin NEGATIVE NEGATIVE Final   C Diff interpretation No C. difficile detected.  Final    Comment: Performed at [facility], [place] [address], [place], [place] [address]  Culture, blood (routine x 2)     Status: None (Preliminary result)   Collection Time: 07/15/19  6:52 AM   Specimen: BLOOD  Result Value Ref Range Status   Specimen Description BLOOD RIGHT ANTECUBITAL  Final   Special Requests   Final    BOTTLES DRAWN AEROBIC ONLY Blood Culture results may not be optimal due to an inadequate volume of blood received in culture bottles   Culture   Final    NO GROWTH 3 DAYS Performed at [facility], [place] [address] [place], [place] [address]    Report Status PENDING  Incomplete  Culture, blood (routine x 2)     Status: None (Preliminary result)   Collection Time: 07/15/19  7:01 AM   Specimen: BLOOD RIGHT ARM  Result Value Ref Range Status   Specimen Description BLOOD RIGHT ARM  Final   Special Requests   Final    BOTTLES DRAWN AEROBIC ONLY Blood Culture adequate volume   Culture   Final    NO GROWTH 3 DAYS Performed at [facility], [place] [address], [place], [place] [address]    Report Status PENDING  Incomplete  SARS CORONAVIRUS 2 (TAT 6-24 HRS) Nasopharyngeal Nasopharyngeal Swab     Status: None   Collection Time: 07/15/19  9:13 AM    Specimen: Nasopharyngeal Swab  Result Value Ref Range Status   SARS Coronavirus 2 NEGATIVE NEGATIVE Final    Comment: (NOTE) SARS-CoV-2 target nucleic acids are NOT DETECTED. The SARS-CoV-2 RNA is generally detectable in upper and lower respiratory specimens during the acute phase of infection. Negative results do not preclude SARS-CoV-2 infection, do not rule out co-infections with other pathogens, and should not be used as the sole basis for treatment or other patient management decisions. Negative results must be combined with clinical observations, patient history, and epidemiological information. The expected  result is Negative. Fact Sheet for Patients: SugarRoll.be Fact Sheet for Healthcare Providers: https://www.woods-mathews.com/ This test is not yet approved or cleared by the [place] FDA and  has been authorized for detection and/or diagnosis of SARS-CoV-2 by FDA under an Emergency Use Authorization (EUA). This EUA will remain  in effect (meaning this test can be used) for the duration of the COVID-19 declaration under Section 56 4(b)(1) of the Act, 21 U.S.C. section 360bbb-3(b)(1), unless the authorization is terminated or revoked sooner. Performed at [facility], [place] [address], [place], [place] [address]   MRSA PCR Screening     Status: None   Collection Time: 07/16/19 12:05 PM   Specimen: Nasal Mucosa; Nasopharyngeal  Result Value Ref Range Status   MRSA by PCR NEGATIVE NEGATIVE Final    Comment:        The GeneXpert MRSA Assay (FDA approved for NASAL specimens only), is one component of a comprehensive MRSA colonization surveillance program. It is not intended to diagnose MRSA infection nor to guide or monitor treatment for MRSA infections. Performed at [facility], [place] [address] [place], [place] [address]       Radiology Studies: No results found.      Scheduled Meds: . aspirin  324 mg  Per Tube Daily  . atorvastatin  80 mg Per Tube q1800  . chlorhexidine  15 mL Mouth Rinse BID  . clopidogrel  75 mg Per Tube Daily  . enoxaparin (LOVENOX) injection  40 mg Subcutaneous Q24H  . feeding supplement (PRO-STAT SUGAR FREE 64)  30 mL Oral BID  . free water  200 mL Per Tube Q8H  . furosemide  40 mg Oral Daily  . insulin aspart  0-9 Units Subcutaneous Q4H  . labetalol  10 mg Intravenous Once  . mouth rinse  15 mL Mouth Rinse q12n4p  . methylphenidate  5 mg Per Tube BID WC   Continuous Infusions: . amiodarone 60 mg/hr (07/19/19 0711)   Followed by  . amiodarone    . ampicillin-sulbactam (UNASYN) IV 1.5 g (07/19/19 0027)  . feeding supplement (JEVITY 1.5 CAL/FIBER) Stopped (07/19/19 0743)    Assessment & Plan:   1.Acute hypoxic respiratory failure and sepsis secondary to aspiration pneumonia.Patient remains at risk for recurrent aspiration.  Tube feeding held again last night and concern for respiratory distress.  Npo this morning and on BiPAP.  ABG shows improvement in PCO2 level to 50s (was in the 70s last week), resumed tube feeding at low rate while following strict aspiration precautions.  Repeat chest x-ray shows mild improvement in right upper lobe and left basilar infiltrates.MRSA PCR negative-off vancomycin.Patient was on IV Zosyn, transitioned to IV Unasyn now.Patient received IV Lasix on admission and last night, now back on oral Lasix-watch blood pressure systolic appears to be soft.  2.Recent CVA with residual weakness and associated encephalopathy with declining mental status-Patient had large acute left PCA infarct, small acute left frontal lobe infarct. He had residual dysphagia requiring PEG tube placement.Continue home medications-antiplatelet ( ASA and plavix DAPT for 3 months and then ASA alone)and statins.  Given further decline in mental status will obtain repeat CT head to rule out any acute changes/midline shift.  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 Vascular surgery  follow up. Avoid low BP. Permissive HTNto ensure adequate cerebral perfusion  4.Recent Acute inferior STEMI with Acute on Chronic systolic, diastolic heart failure with pulmonary edema-Medically managed January 2021. Resume prior meds, lasix, aspirin Plavix and statins.Held beta-blockers in concern for sepsis/hypotension.  Repeat echocardiogram 1/15 EF 30-35%, grade III diastolic dysfunction.  5.  New onset A. fib with RVR, paroxysmal: Beta-blockers held in concern for hypotension, patient however had new onset A. fib overnight and received IV labetalol x1.  Also started on gtt. amiodarone.  In normal sinus rhythm now.  Although A. fib could be paroxysmal in the setting of respiratory distress/hypoxia, given recent stroke, consulted Dr. Ellyn Hack for further recommendations--oral amiodarone plus or minus anticoagulation.  Follow-up CT head results  5.CKD III a :Renal function for now at baseline.Monitor while being diuresed and dealing with sepsis  6.  Hypokalemia/Mild hypernatremia-K Replaced. Increased free water via PEG. Monitor on daily labs  7. LFT elevation -Likely congestion vs sepsis related injury.Stable On lipitor due to acute stroke. Now improving.  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  Goals of care-prior hospitalist MD discussed with wife that the patient is suffering from multiorgan failure.Patient remains at high risk for recurrent aspiration as well as poor prognosis down the road given his significant deconditioning and weakness.Wife is hopeful for recovery adequate enough for rehab. Will follow up on repeat CT results.Seen by palliative care-Recommended DNR given pt's poor prognosis.  Currently partial code-DNI   DVT prophylaxis: Heparin Code Status: DNI Family / Patient Communication: Discussed with daughter in detail at bedside. Also d/w Significant other, POA  [name] this morning who understands terminal prognosis but would like patient's sister and kids to be on board before making DNR decision. Disposition Plan: TBD, patient is critically ill at this time.   Noted CT results (baseline atrophy, old infarcts, large subacute PCA infarct with now petechial hemorrhages/necrosis. Noted cardiology eval and recommendations. Met again with daughter Levi Pruitt at bedside at 1.59BZ and explained grave prognosis--she asked questions about home hospice vs inpatient hospice vs palliative care. She is also leaning towards changing code status to DNR but wants to talk to her siblings and confirm with POA , Miss [name]. Patient did look more awake, talking few sentences and saturating okay on 5 lits O2. I  followed up with daughter at 6.35pm again--she stated family hasn't reached a decision yet---will f/u in am. Would call POA in the event of rapid deterioration tonight before pursuing aggressive interventions. Continue current care for today.    LOS: 4 days    Time spent: 45 minutes.     Guilford Shi, MD [facility] Pager in [place]  If 7PM-7AM, please contact night-coverage [email or web address] Password [email or web address] 07/19/2019, 8:22 AM

## 2019-07-19 NOTE — Significant Event (Addendum)
Rapid Response Rounding Note  Overview: Time Called: 1100 Arrival Time: 1100 Event Type: Other (Comment)(Rounding assessment) Rounding on pt following rapid response event last night.  Initial Focused Assessment: Pt remains on BiPAP 50% FiO2, 12/6, RR 19-20, SpO2 98%. Pt awakens to physical stimuli, but does not remain awake. Lung sounds are clear, diminished.   Interventions: -Daughter is at bedside and educated on risks, such as aspiration, and benefits associated with him wearing the BiPAP.  -Discussed with Dr. Earnest Conroy regarding rechecking ABG and MD also ordered repeat CXR.  Plan of Care (if not transferred): -Report results of ABG and CXR to attending MD. -Aspiration precautions: keep HOB >30 degrees, suction set up at bedside, TF stopped at this time  Event Summary: Name of Physician Notified: Dr. Earnest Conroy at 1109    at    Outcome: Stayed in room and stabalized  Event End Time: [place]

## 2019-07-19 NOTE — Progress Notes (Signed)
  Amiodarone Drug - Drug Interaction Consult Note  Recommendations: MONITOR FOR MYOPATHY AND HYPOKALEMIA.  Amiodarone is metabolized by the cytochrome P450 system and therefore has the potential to cause many drug interactions. Amiodarone has an average plasma half-life of 50 days (range 20 to 100 days).   There is potential for drug interactions to occur several weeks or months after stopping treatment and the onset of drug interactions may be slow after initiating amiodarone.   [x]  Statins: Increased risk of myopathy. Simvastatin- restrict dose to 20mg  daily. Other statins: counsel patients to report any muscle pain or weakness immediately.  []  Anticoagulants: Amiodarone can increase anticoagulant effect. Consider warfarin dose reduction. Patients should be monitored closely and the dose of anticoagulant altered accordingly, remembering that amiodarone levels take several weeks to stabilize.  []  Antiepileptics: Amiodarone can increase plasma concentration of phenytoin, the dose should be reduced. Note that small changes in phenytoin dose can result in large changes in levels. Monitor patient and counsel on signs of toxicity.  []  Beta blockers: increased risk of bradycardia, AV block and myocardial depression. Sotalol - avoid concomitant use.  []   Calcium channel blockers (diltiazem and verapamil): increased risk of bradycardia, AV block and myocardial depression.  []   Cyclosporine: Amiodarone increases levels of cyclosporine. Reduced dose of cyclosporine is recommended.  []  Digoxin dose should be halved when amiodarone is started.  [x]  Diuretics: increased risk of cardiotoxicity if hypokalemia occurs.  []  Oral hypoglycemic agents (glyburide, glipizide, glimepiride): increased risk of hypoglycemia. Patient's glucose levels should be monitored closely when initiating amiodarone therapy.   []  Drugs that prolong the QT interval:  Torsades de pointes risk may be increased with concurrent use  - avoid if possible.  Monitor QTc, also keep magnesium/potassium WNL if concurrent therapy can't be avoided. [name] Antibiotics: e.g. fluoroquinolones, erythromycin. . Antiarrhythmics: e.g. quinidine, procainamide, disopyramide, sotalol. . Antipsychotics: e.g. phenothiazines, haloperidol.  . Lithium, tricyclic antidepressants, and methadone.  Thank you,  Wynona Neat, PharmD, BCPS 07/19/2019 6:41 AM

## 2019-07-19 NOTE — Progress Notes (Signed)
0020: when entered pt's room, pt was labored breathing, had expiratory wheezing and crackles in all lung fields.Pt's base line is AO to self.  PRN breathing treatment administered. Breathing treatment helped some, but pt's RR was 30-36, labored abdominal breathing. Charge nurse made aware.  0025: Rapid response nurse was on the floor, called in the room to evaluate patient. Pt is partial code, Rapid RN suggested pt may benefit from Bi-pap.  0030: Called and notified Respiratory therapist, she said she will be up here with bi-pap. Bi-pap and ABG order placed per rapid RN.  0040: Bi-pap placed per RT, ABGs drawn, in epic for review.  [id number]: On call Tried NP B[place]heimer notified per Rapid RN. Received order for 60 mg iv lasix x 1. Lasix administered per order. Pt seems comfortable in Bi-pap. Head of the bed elevated. o2 sat 97% in Bi-pap, RR 24. Will continue to monitor.

## 2019-07-19 NOTE — Progress Notes (Signed)
Patient resting comfortably on 4L [place] with no respiratory distress noted. BIPAP not needed at this time. BIPAP is at bedside if needed. RT will continue to monitor.

## 2019-07-20 ENCOUNTER — Other Ambulatory Visit: Payer: Self-pay

## 2019-07-20 DIAGNOSIS — I63432 Cerebral infarction due to embolism of left posterior cerebral artery: Secondary | ICD-10-CM

## 2019-07-20 DIAGNOSIS — E440 Moderate protein-calorie malnutrition: Secondary | ICD-10-CM

## 2019-07-20 LAB — HEPATIC FUNCTION PANEL
ALT: 220 U/L — ABNORMAL HIGH (ref 0–44)
AST: 177 U/L — ABNORMAL HIGH (ref 15–41)
Albumin: 2.3 g/dL — ABNORMAL LOW (ref 3.5–5.0)
Alkaline Phosphatase: 109 U/L (ref 38–126)
Bilirubin, Direct: <0.1 mg/dL (ref 0.0–0.2)
Total Bilirubin: 0.4 mg/dL (ref 0.3–1.2)
Total Protein: 5.9 g/dL — ABNORMAL LOW (ref 6.5–8.1)

## 2019-07-20 LAB — CULTURE, BLOOD (ROUTINE X 2)
Culture: NO GROWTH
Culture: NO GROWTH
Special Requests: ADEQUATE

## 2019-07-20 LAB — CBC
HCT: 42 % (ref 39.0–52.0)
Hemoglobin: 12.9 g/dL — ABNORMAL LOW (ref 13.0–17.0)
MCH: 30.8 pg (ref 26.0–34.0)
MCHC: 30.7 g/dL (ref 30.0–36.0)
MCV: 100.2 fL — ABNORMAL HIGH (ref 80.0–100.0)
Platelets: 305 10*3/uL (ref 150–400)
RBC: 4.19 MIL/uL — ABNORMAL LOW (ref 4.22–5.81)
RDW: 14.1 % (ref 11.5–15.5)
WBC: 13.3 10*3/uL — ABNORMAL HIGH (ref 4.0–10.5)
nRBC: 0 % (ref 0.0–0.2)

## 2019-07-20 LAB — BASIC METABOLIC PANEL
Anion gap: 10 (ref 5–15)
BUN: 35 mg/dL — ABNORMAL HIGH (ref 8–23)
CO2: 37 mmol/L — ABNORMAL HIGH (ref 22–32)
Calcium: 8.5 mg/dL — ABNORMAL LOW (ref 8.9–10.3)
Chloride: 99 mmol/L (ref 98–111)
Creatinine, Ser: 1.2 mg/dL (ref 0.61–1.24)
GFR calc Af Amer: >60 mL/min (ref >60)
GFR calc non Af Amer: 60 mL/min — ABNORMAL LOW (ref >60)
Glucose, Bld: 107 mg/dL — ABNORMAL HIGH (ref 70–99)
Potassium: 3.2 mmol/L — ABNORMAL LOW (ref 3.5–5.1)
Sodium: 146 mmol/L — ABNORMAL HIGH (ref 135–145)

## 2019-07-20 LAB — GLUCOSE, CAPILLARY
Glucose-Capillary: 109 mg/dL — ABNORMAL HIGH (ref 70–99)
Glucose-Capillary: 118 mg/dL — ABNORMAL HIGH (ref 70–99)
Glucose-Capillary: 120 mg/dL — ABNORMAL HIGH (ref 70–99)
Glucose-Capillary: 99 mg/dL (ref 70–99)
Glucose-Capillary: 99 mg/dL (ref 70–99)

## 2019-07-20 LAB — POTASSIUM: Potassium: 4.2 mmol/L (ref 3.5–5.1)

## 2019-07-20 LAB — MAGNESIUM: Magnesium: 2.3 mg/dL (ref 1.7–2.4)

## 2019-07-20 MED ORDER — FUROSEMIDE 20 MG PO TABS
20.0000 mg | ORAL_TABLET | Freq: Every day | ORAL | Status: DC
  Administered 2019-07-21: 20 mg via ORAL
  Filled 2019-07-20: qty 1

## 2019-07-20 MED ORDER — FREE WATER
200.0000 mL | Freq: Four times a day (QID) | Status: DC
  Administered 2019-07-20 – 2019-07-21 (×4): 200 mL

## 2019-07-20 MED ORDER — POTASSIUM CHLORIDE CRYS ER 20 MEQ PO TBCR: 30.0000 meq | EXTENDED_RELEASE_TABLET | ORAL | Status: DC

## 2019-07-20 MED ORDER — INFLUENZA VAC A&B SA ADJ QUAD 0.5 ML IM PRSY
0.5000 mL | PREFILLED_SYRINGE | INTRAMUSCULAR | Status: DC
  Filled 2019-07-20: qty 0.5

## 2019-07-20 MED ORDER — JEVITY 1.5 CAL/FIBER PO LIQD
1000.0000 mL | ORAL | Status: DC
  Administered 2019-07-20: 1000 mL
  Filled 2019-07-20 (×2): qty 1000

## 2019-07-20 MED ORDER — POTASSIUM CHLORIDE 20 MEQ/15ML (10%) PO SOLN
30.0000 meq | ORAL | Status: AC
  Administered 2019-07-20 (×2): 30 meq via ORAL
  Filled 2019-07-20 (×2): qty 30

## 2019-07-20 NOTE — Progress Notes (Addendum)
Progress Note  Patient Name: Levi Pruitt Date of Encounter: 07/20/2019  Primary Cardiologist: No primary care provider on file.   Patient profile   Levi Pruitt is a 74 y.o. male  with recent CVA with right-sided hemiparesis, dysphagia requiring PEG tube, complicated with inferior wall, sever carotid artery diseasewho discharged to CIR last about 2 weeks ago and then transferred back to the floor from inpatient rehab due to respiratory failure. He found to have multifocal PNA likely secondary to aspiration dye to dysphagia after stroke. He required BiPAP. Some improvement today regarding O2 requirement and now on 3-4 li O 2 via [place].   Subjective   Patient was seen and evaluated at bedside on morning rounds. No acute events overnight and he has been off of BiPAP since around 4 AM and now on 3.5 li [place]. He answers some of the questions. No acute complaints.   Inpatient Medications    Scheduled Meds:  amiodarone  200 mg Oral BID   aspirin  324 mg Per Tube Daily   atorvastatin  80 mg Per Tube q1800   chlorhexidine  15 mL Mouth Rinse BID   clopidogrel  75 mg Per Tube Daily   enoxaparin (LOVENOX) injection  40 mg Subcutaneous Q24H   feeding supplement (PRO-STAT SUGAR FREE 64)  30 mL Oral BID   free water  200 mL Per Tube Q8H   furosemide  40 mg Oral Daily   insulin aspart  0-9 Units Subcutaneous Q4H   labetalol  10 mg Intravenous Once   mouth rinse  15 mL Mouth Rinse q12n4p   methylphenidate  5 mg Per Tube BID WC   Continuous Infusions:  ampicillin-sulbactam (UNASYN) IV 1.5 g (07/20/19 0957)   feeding supplement (JEVITY 1.5 CAL/FIBER) 1,000 mL (07/19/19 1628)   PRN Meds: acetaminophen **OR** acetaminophen, ipratropium-albuterol, Melatonin, ondansetron **OR** ondansetron (ZOFRAN) IV   Vital Signs    Vitals:   07/19/19 2001 07/19/19 2332 07/20/19 0420 07/20/19 0745  BP: 108/86 114/85 (!) 108/56 110/63  Pulse: 60 60 62 60  Resp: 20 (!) 22 (!) 22 (!) 22  Temp: 98.2 F (36.8 C)  98.2 F (36.8 C) 98.1 F (36.7 C) 98.1 F (36.7 C)  TempSrc: Oral Oral Axillary Oral  SpO2: 98% 98% 99% 98%  Weight:        Intake/Output Summary (Last 24 hours) at 07/20/2019 1041 Last data filed at 07/20/2019 0755 Gross per 24 hour  Intake 640.54 ml  Output 350 ml  Net 290.54 ml   Last 3 Weights 07/18/2019 07/14/2019 07/13/2019  Weight (lbs) 214 lb 8 oz 196 lb 3.4 oz 202 lb 13.2 oz  Weight (kg) 97.297 kg 89 kg 92 kg      Telemetry    Sinus bradycardia and few PVCs- Personally Reviewed  ECG    Not done today - Personally Reviewed  Physical Exam   GEN: Ill appearing, on nasal canula, no acute distress Neck:  No JVD Cardiac: regular rhythm, borderline bradycardia, no murmurs, rubs, or gallops.  Respiratory: Clear to auscultation bilaterally. GI: Soft, nontender, non-distended  MS: No lower extremity edema; (has unilateral  1 + upper extremity swelling at right hemiparesic arm and hand)  Neuro:  Nonfocal  Psych: Easy to arouse to verbal stimuli, rt side hemiparesia  Labs    High Sensitivity Troponin:   Recent Labs  Lab 07/15/19 0652 07/15/19 1028  TROPONINIHS 545* 493*      Chemistry Recent Labs  Lab 07/18/19 0320 07/19/19 [id number] 07/20/19 0256  NA 146* 148* 146*  K 3.6 3.4* 3.2*  CL 100 102 99  CO2 34* 36* 37*  GLUCOSE 168* 156* 107*  BUN 33* 32* 35*  CREATININE 1.26* 1.06 1.20  CALCIUM 8.3* 8.6* 8.5*  PROT 6.1* 6.1* 5.9*  ALBUMIN 2.5* 2.4* 2.3*  AST 88* 95* 177*  ALT 144* 155* 220*  ALKPHOS 121 125 109  BILITOT 0.6 0.5 0.4  GFRNONAA 56* >60 60*  GFRAA >60 >60 >60  ANIONGAP 12 10 10      Hematology Recent Labs  Lab 07/18/19 0320 07/19/19 0337 07/20/19 0256  WBC 13.5* 11.6* 13.3*  RBC 4.46 4.50 4.19*  HGB 13.6 13.9 12.9*  HCT 43.4 44.8 42.0  MCV 97.3 99.6 100.2*  MCH 30.5 30.9 30.8  MCHC 31.3 31.0 30.7  RDW 13.7 14.0 14.1  PLT 331 321 305    BNP Recent Labs  Lab 07/15/19 0652  BNP 893.6*     DDimer  Recent Labs  Lab 07/15/19 0712    DDIMER 3.49*     Radiology    CT HEAD WO CONTRAST  Result Date: 07/19/2019 CLINICAL DATA:  Recent large stroke; mental status change, unknown cause. EXAM: CT HEAD WITHOUT CONTRAST TECHNIQUE: Contiguous axial images were obtained from the base of the skull through the vertex without intravenous contrast. COMPARISON:  Brain MRI 06/19/2019 FINDINGS: Brain: Redemonstrated, now subacute, large left PCA territory infarct. As before, this infarct involves the medial aspects of the left temporal and occipital lobes, left thalamus, posterior limb of left internal capsule and splenium of corpus callosum. Involvement of the left mamillary body and left cerebral peduncle were better appreciated on prior MRI 06/19/2019. There is now petechial hemorrhage and/or cortical laminar necrosis within portions of the paramedian left parietooccipital lobe infarction territory (for instance as seen on series 3, image 16). A small subacute cortical infarct involving the left frontal lobe precentral gyrus was better appreciated on prior MRI. Redemonstrated chronic infarcts within the right temporal and occipital lobes. Also redemonstrated are chronic lacunar infarcts within the bilateral cerebellar hemispheres. No new demarcated infarct is identified. No midline shift or extra-axial fluid collection. Stable mild generalized parenchymal atrophy with minimal background chronic small vessel ischemic disease. Vascular: No hyperdense vessel.  Atherosclerotic calcifications. Skull: Normal. Negative for fracture or focal lesion. Sinuses/Orbits: Visualized orbits demonstrate no acute abnormality. No significant paranasal sinus disease or mastoid effusion at the imaged levels. IMPRESSION: Large, now subacute, left PCA territory infarct. Petechial hemorrhage and/or cortical laminar necrosis is now present within portions of the paramedian left parietooccipital infarction territory. A small, now subacute, cortical infarct within the left  frontal lobe precentral gyrus was better appreciated on prior MRI 06/19/2019. Redemonstrated chronic infarcts within the right temporal and occipital lobes and bilateral cerebellar hemispheres. Mild generalized parenchymal atrophy. Minimal background chronic small vessel ischemic disease. Electronically Signed   By: Kellie Simmering DO   On: 07/19/2019 16:56   DG CHEST PORT 1 VIEW  Result Date: 07/19/2019 CLINICAL DATA:  Respiratory failure. EXAM: PORTABLE CHEST 1 VIEW COMPARISON:  Chest x-ray dated July 16, 2019. FINDINGS: Stable cardiomediastinal silhouette. Improving consolidation in the right upper lobe. Additional patchy airspace disease in the left upper and mid lung has mildly improved as well. No pneumothorax or large pleural effusion. No acute osseous abnormality. IMPRESSION: Improving multifocal pneumonia. Electronically Signed   By: Titus Dubin M.D.   On: 07/19/2019 11:40    Cardiac Studies     (Echo from recent admission)06/25/2019: IMPRESSIONS    1. Left  ventricular ejection fraction, by visual estimation, is 30 to  35%. The left ventricle has moderate to severely decreased function. There  is mildly increased left ventricular hypertrophy.   2. The left ventricle demonstrates regional wall motion abnormalities.  Inferior/inferoseptal/apical akinesis   3. Moderately dilated left ventricular internal cavity size.   4. Left ventricular diastolic parameters are consistent with Grade III  diastolic dysfunction (restrictive).   5. Elevated left atrial pressure.   6. Global right ventricle has normal systolic function.The right  ventricular size is normal.   7. The mitral valve is normal in structure. Trivial mitral valve  regurgitation.   8. The tricuspid valve is normal in structure.   9. The aortic valve is tricuspid. Aortic valve regurgitation is not  visualized. No evidence of aortic valve sclerosis or stenosis.  10. The pulmonic valve was not well visualized. Pulmonic valve   regurgitation is not visualized.  11. TR signal is inadequate for assessing pulmonary artery systolic  pressure.  12. The inferior vena cava is normal in size with greater than 50%  respiratory variability, suggesting right atrial pressure of 3 mmHg.     Patient Profile     Levi Pruitt is a 74 y.o. male  with recent CVA with right-sided hemiparesis, dysphagia requiring PEG tube, complicated with inferior wall, sever carotid artery diseasewho discharged to CIR last about 2 weeks ago and then transferred back to the floor from inpatient rehab due to respiratory failure. He found to have multifocal PNA likely secondary to aspiration dye to dysphagia after stroke. He required BiPAP. Some improvement today regarding O2 requirement and now on 3-4 li O 2 via [place].   Assessment & Plan   [facility] will sign off.   Medication Recommendations:  Continue current meds As below Other recommendations (labs, testing, etc): Replace K and electrolytes as needed Follow up as an outpatient:    For questions or updates, please contact [facility] Please consult [email or web address] for contact info under    Combined systolic and diastolic HF: Patient admitted recently last month with a stroke and acute inferior STEMI that decided to be managed medically and not perform heart cath due to co morbidities. He developed acute on chronic systolic, diastolic heart failure with pulmonary edema.   Echo from recent admission (1/15) with EF 30-35% LV with regional wall motion abnormalities and inferior/inferoseptal/apical akinesis. Grade 3 DD (restrictive)   Weight up from recent hospitalization but no volume overload on exam. Patient received IV Lasix on admission but now back on oral Lasix. BNP was ~900 on arrival. Not repeated.  He was diuresed about 4 li with Lasix. Remained euvolumic on exam.  Kidney function stable.  -Recommending to continue current conservative care:  -Continue PO Lasix given volume  status is normal  -Holding coreg and other BB given soft BP and HR at 50s -Holding ACEI /ARB and spironolactone with soft BP -Monitor BNP -Strict I/Os -Weight daily -Cardiology team will follow up -BMP daily and replace electrolytes as needed  -Highly recommending to continue goal of care discussion with family   Acute hypoxic respiratory failure likely secondary to aspiration pneumonia: Patient with recent CVA and residual dysphagia and high risk for recurrent aspirations. He has been on IV Ab, required BiPAP. Now saturating at late 32s with 3-4 nasal O2.    ABG [id number] metabolic alkalosis and respiratory acidosis (Ph 7.47, PCO2 53, HCO3 39.5)   -Repeat CXR 2/8 showed some improvement of multifocal PNA. Now off od BiPAP and on  3-4 li of nasal O2. Has BiPAP as needed. -He is Npo, and on tube feeding -Continue antibiotic (This is day 4 of Unasyn) per primary -Patient is DNI but family wants CPR and shock if coded. Recommending goal of care discussion and palliative care involvement given multiple co morbidities, CVA, dysphagia and being on peg tubet, HF with EF 30-35%, recent MI,  sever carotid artery disease   Afib with RVR: -[place] sinus, on Amiodarone -Recommending to continue PO Amiodaron (PO Amiodarone 200 mg BID x 1 week then 200 mg PO QD x 1week and then discontinue) -No BB due to soft BP and rate is at 50s. -CHADsVAs score is 5. No anticoagulation recommended though as risk>benefit with recent stroke and other co morbidities. -K 3.2. Recommending to replace and check Mg -Continue cardiac monitoring -Cardiology sign off. Please re consult if needed    Recent CVA with residual weakness and dysphagia: -Tube feeding per primary -Continue ASA and Plavix and statin -on ASA, Plavix, statin and lasix -Primary team is considering goal of care discussion with family   Sever carotid artery stenosis, PCA, vertebral stenosis: His head CTA did show high-grade carotid bulb disease on  the right with string sign in bilateral carotid disease at the siphon. Per primary, vascular surgery follow up.  -Avoid low BP. -Continue ASA, Plavix and statin    Signed, Dewayne Hatch, MD  07/20/2019, 10:41 AM     Agree with note by Dr Myrtie Hawk  Levi Pruitt is off BiPAP today and on 4 L nasal prong.  He seems slightly more responsive.  He has diuresed with Lasix.  I discussed his plan of care with his primary attending on the [facility] hospitalist service.  He is not a candidate for intervention.  I recommend conservative medical care.  Can transition from IV to p.o. amiodarone for his brief PAF and continue treatment for his pulmonary process.  I would put him on ongoing low-dose oral Lasix in addition to other medications as outlined for his ischemic cardiomyopathy.  I believe the most important thing is to address goals of care with the family.  No further recommendations at this time.  We will sign off but be available for further questions as necessary.   Lorretta Harp, M.D., [place], [facility], [place] [place] [facility] [address] [place], [place]  [address]  [phone] 07/20/2019 12:20 PM

## 2019-07-20 NOTE — Evaluation (Addendum)
Physical Therapy Evaluation Patient Details Name: Levi Pruitt MRN: [id number] DOB: 03/03/1946 Today's Date: 07/20/2019   History of Present Illness  Levi Pruitt is a 74 y.o. male  with recent CVA with right-sided hemiparesis, dysphagia requiring PEG tube, complicated with inferior wall, sever carotid artery diseasewho discharged to CIR last about 2 weeks ago and then transferred back to the floor from inpatient rehab due to respiratory failure. He found to have multifocal PNA likely secondary to aspiration dye to dysphagia after stroke. He is currently requiring BiPAP. No significant PMH on file.  Clinical Impression  Pt presents with an overall decrease in functional mobility secondary to above. Prior to hospital admission pt was independent and living alone. Pt recently discharged to CIR for rehabilitation following CVA requiring min-mod assist for sitting balance and max assist +2 for transfers. Today, pt able to tolerate rolling in each direction with max assist, able to come up to sitting with max +2 assist. In sitting pt requiring mod assist for sitting balance secondary to R lateral lean, cues for R attention and visual scanning, able to sit EOB x 3 min - limited secondary to fatigue. Pt on 4L O2/min throughout session via [place], SpO2 maintained >93% with activity. Pt would benefit from continued acute PT services to maximize functional mobility. Recommend d/c back to CIR to continue intensive rehabilitation in order to maximize independence with transfers to w/c, progress sitting balance and decrease caregiver burden of care.     Follow Up Recommendations CIR;Supervision/Assistance - 24 hour    Equipment Recommendations  Other (comment)(tbd next venue)    Recommendations for Other Services Rehab consult     Precautions / Restrictions Precautions Precautions: Fall Precaution Comments: right hemiparesis, right inattention, right visual field deficit Restrictions Weight Bearing Restrictions:  No      Mobility  Bed Mobility Overal bed mobility: Needs Assistance Bed Mobility: Rolling;Sidelying to Sit;Sit to Supine;Sit to Sidelying Rolling: Max assist;+2 for physical assistance Sidelying to sit: Max assist;+2 for safety/equipment Supine to sit: Max assist;+2 for physical assistance Sit to supine: Total assist;+2 for physical assistance   General bed mobility comments: assist to initiate rolling to R, pt actively flexes L LE with commands and placing L UE across his body to grasp bedrail with hand over hand assist and cues for R visual scanning, used pad to help him roll, then assisted to bring LEs off bed, pt pushing through L elbow with mod A to come up to sit; Pt requiring max +2 assist for supine>sit to lift legs back to bed and control trunk. +2 to boost up in bed.  Transfers Overall transfer level: (unable to perform today - safety/medical)       General Gait Details: unable     Wheelchair Mobility    Modified Rankin (Stroke Patients Only) Modified Rankin (Stroke Patients Only) Pre-Morbid Rankin Score: No symptoms Modified Rankin: Severe disability     Balance Overall balance assessment: Needs assistance Sitting-balance support: Feet supported;Single extremity supported;No upper extremity supported Sitting balance-Leahy Scale: Poor Sitting balance - Comments: up to mod assist today for static sitting balance, pt with R lateral lean requiring cues to come to midline and use L UE to lean into for support. Worked on sitting balance x 3 minutes today however pt limited by fatigue and poor endurance Postural control: Posterior lean;Right lateral lean  Pertinent Vitals/Pain Pain Assessment: Faces Faces Pain Scale: No hurt    Home Living Family/patient expects to be discharged to:: Skilled nursing facility   Available Help at Discharge: Family;Friend(s);Available PRN/intermittently Type of Home: House Home  Access: Stairs to enter Entrance Stairs-Rails: Psychiatric nurse of Steps: couple  Home Layout: One level   Additional Comments: daughter lives in [place], long time girlfriend works but available PRN, unsure of home setup as pt reported having a multilevel house    Prior Function Level of Independence: Independent         Comments: prior to hospital admission- independent, driving, working Biochemist, clinical) - all of the above information retrieved from chart review due to pt's cognitive impairments     Hand Dominance   Dominant Hand: Right    Extremity/Trunk Assessment   Upper Extremity Assessment Upper Extremity Assessment: Generalized weakness;RUE deficits/detail RUE Deficits / Details: flaccid RUE Sensation: decreased proprioception;decreased light touch RUE Coordination: decreased gross motor;decreased fine motor LUE Sensation: WNL LUE Coordination: WNL    Lower Extremity Assessment Lower Extremity Assessment: Generalized weakness;RLE deficits/detail;LLE deficits/detail RLE Deficits / Details: no active movement noted, hemiparesis RLE Sensation: decreased light touch RLE Coordination: decreased fine motor;decreased gross motor LLE Deficits / Details: grossly 3-/5 throughout LLE Sensation: WNL       Communication   Communication: Receptive difficulties;Expressive difficulties  Cognition Arousal/Alertness: Awake/alert Behavior During Therapy: Flat affect Overall Cognitive Status: Impaired/Different from baseline Area of Impairment: Attention;Safety/judgement;Awareness;Problem solving                 Orientation Level: Time;Situation;Place Current Attention Level: Sustained Memory: Decreased short-term memory Following Commands: Follows one step commands consistently;Follows one step commands with increased time Safety/Judgement: Decreased awareness of safety;Decreased awareness of deficits Awareness: Intellectual Problem Solving: Slow  processing;Requires verbal cues General Comments: pt initially sleeping, able to arouse with verbal/tactile stimuli and then maintained alert state throughout the session today, following one step commands. Mumbles brief sentences, a few words      General Comments      Exercises     Assessment/Plan    PT Assessment Patient needs continued PT services  PT Problem List Decreased strength;Decreased mobility;Decreased safety awareness;Impaired tone;Decreased range of motion;Decreased coordination;Decreased knowledge of precautions;Obesity;Decreased activity tolerance;Decreased cognition;Decreased balance;Impaired sensation       PT Treatment Interventions DME instruction;Therapeutic exercise;Gait training;Balance training;Neuromuscular re-education;Functional mobility training;Therapeutic activities;Patient/family education    PT Goals (Current goals can be found in the Care Plan section)  Acute Rehab PT Goals Patient Stated Goal: When asked pt saying "yes" he would like to go back to CIR PT Goal Formulation: With patient Time For Goal Achievement: 08/03/19 Potential to Achieve Goals: Fair    Frequency Min 3X/week   Barriers to discharge Decreased caregiver support lives alone prior to admission    Co-evaluation               AM-PAC PT "6 Clicks" Mobility  Outcome Measure Help needed turning from your back to your side while in a flat bed without using bedrails?: Total Help needed moving from lying on your back to sitting on the side of a flat bed without using bedrails?: A Lot Help needed moving to and from a bed to a chair (including a wheelchair)?: Total Help needed standing up from a chair using your arms (e.g., wheelchair or bedside chair)?: Total Help needed to walk in hospital room?: Total Help needed climbing 3-5 steps with a railing? : Total 6 Click Score: 7    End of  Session Equipment Utilized During Treatment: Oxygen Activity Tolerance: Patient limited by  fatigue;Patient tolerated treatment well Patient left: in bed;with call bell/phone within reach;with bed alarm set Nurse Communication: Need for lift equipment;Mobility status PT Visit Diagnosis: Muscle weakness (generalized) (M62.81);Difficulty in walking, not elsewhere classified (R26.2);Unsteadiness on feet (R26.81);Other symptoms and signs involving the nervous system (R29.898);Hemiplegia and hemiparesis Hemiplegia - Right/Left: Right Hemiplegia - dominant/non-dominant: Dominant Hemiplegia - caused by: Cerebral infarction    Time: 0910-0927 PT Time Calculation (min) (ACUTE ONLY): 17 min   Charges:   PT Evaluation $PT Eval Moderate Complexity: 1 Mod          Netta Corrigan, PT, DPT, [place] [facility] [place] 07/20/2019, 3:02 PM

## 2019-07-20 NOTE — Progress Notes (Addendum)
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 Patient's condition however worsened on the night of admission requiring100% NRBM,with CXR showing worsening infiltrates, patient was given Lasix 40 mg IV and hospitalist was consulted for readmission to acute care.   Hospital course complicated by waxing and waning mental status, respiratory distress on the night of 2/8, requiring Bipap,received 60mg  of IV Lasix. Also went into A-Fib w RVR with associated hypotension limiting AV blocker tolerance and started on IV amiodarone--seen by cardiology-converted to sinus rhythm and now maintained on oral amiodarone.  Multiple discussions held with family members by hospitalist team as well as palliative care team regarding overall poor prognosis.  Subjective: Patient on 4 L O2 this morning and appears more awake/alert than yesterday.  He is oriented to place only.  Could not name his children or month or year.  Remains in sinus rhythm.  Tolerating PEG feeds at low rate well.  Heart rate in the 0000000 and systolic blood pressure [id number].  Cardiology at bedside during rounds this morning.  Objective: Vitals:   07/20/19 0745 07/20/19 1111 07/20/19 1140 07/20/19 1623  BP: 110/63 120/64 98/84 (!) 101/53  Pulse: 60 (!) 58 (!) 59 66  Resp: (!) 22 20 20  (!) 25  Temp: 98.1 F (36.7 C) 98.3 F (36.8 C) 98.3 F (36.8 C) 97.9 F (36.6 C)  TempSrc: Oral Axillary Oral Oral  SpO2: 98% 100% 99% 96%  Weight:        Intake/Output Summary (Last 24 hours) at 07/20/2019 1704 Last data filed at 07/20/2019 1337 Gross per 24 hour  Intake 340 ml  Output --  Net 340 ml   Filed  Weights   07/18/19 0500  Weight: 97.3 kg    Physical Examination:  General exam: Appears to be more awake and alert today Respiratory system: Clear to auscultation, reduced breath sounds at bases, respiratory effort normal. Cardiovascular system: S1 & S2 heard, RRR. No JVD, murmurs.  Trace pedal edema  Gastrointestinal system: S/p PEG, abdomen is nondistended, soft and nontender. Normal bowel sounds heard. Central nervous system: Appears more awake alert, has some dysarthria, right hemiparesis Extremities: Upper extremities appear puffy, no significant edema in lower extremities today Psychiatry: Judgement and insight appear impaired. Mood & affect appropriate.   Data Reviewed: I have personally reviewed following labs and imaging studies  CBC: Recent Labs  Lab 07/15/19 0652 07/15/19 0652 07/16/19 0253 07/17/19 0812 07/18/19 0320 07/19/19 0337 07/20/19 0256  WBC 12.9*   < > 15.1* 14.0* 13.5* 11.6* 13.3*  NEUTROABS 9.7*  --   --  10.0*  --   --   --   HGB 15.6   < > 13.4 13.8 13.6 13.9 12.9*  HCT 48.2   < > 40.9 42.8 43.4 44.8 42.0  MCV 96.2   < > 94.5 94.5 97.3 99.6 100.2*  PLT 238   < > 276 321 331 321 305   < > = values in this interval not displayed.   Basic Metabolic Panel: Recent Labs  Lab 07/16/19 0253 07/17/19 0812 07/18/19 0320 07/19/19 0337 07/20/19 0256  NA 142 142 146* 148* 146*  K 5.3* 3.3* 3.6 3.4* 3.2*  CL  98 96* 100 102 99  CO2 28 34* 34* 36* 37*  GLUCOSE 86 176* 168* 156* 107*  BUN 37* 34* 33* 32* 35*  CREATININE 1.43* 1.41* 1.26* 1.06 1.20  CALCIUM 8.4* 8.5* 8.3* 8.6* 8.5*  MG  --  2.5* 2.4  --  2.3  PHOS  --  3.6  --   --   --    GFR: Estimated Creatinine Clearance: 63 mL/min (by C-G formula based on SCr of 1.2 mg/dL). Liver Function Tests: Recent Labs  Lab 07/16/19 0253 07/17/19 0812 07/18/19 0320 07/19/19 0337 07/20/19 0256  AST 101* 104* 88* 95* 177*  ALT 138* 155* 144* 155* 220*  ALKPHOS 102 129* 121 125 109  BILITOT 1.0 0.9 0.6  0.5 0.4  PROT 5.6* 6.1* 6.1* 6.1* 5.9*  ALBUMIN 2.4* 2.5* 2.5* 2.4* 2.3*   No results for input(s): LIPASE, AMYLASE in the last 168 hours. Recent Labs  Lab 07/16/19 0941  AMMONIA 25   Coagulation Profile: No results for input(s): INR, PROTIME in the last 168 hours. Cardiac Enzymes: No results for input(s): CKTOTAL, CKMB, CKMBINDEX, TROPONINI in the last 168 hours. BNP (last 3 results) No results for input(s): PROBNP in the last 8760 hours. HbA1C: No results for input(s): HGBA1C in the last 72 hours. CBG: Recent Labs  Lab 07/19/19 1956 07/19/19 2328 07/20/19 0425 07/20/19 0819 07/20/19 1139  GLUCAP 95 84 109* 99 120*   Lipid Profile: No results for input(s): CHOL, HDL, LDLCALC, TRIG, CHOLHDL, LDLDIRECT in the last 72 hours. Thyroid Function Tests: No results for input(s): TSH, T4TOTAL, FREET4, T3FREE, THYROIDAB in the last 72 hours. Anemia Panel: No results for input(s): VITAMINB12, FOLATE, FERRITIN, TIBC, IRON, RETICCTPCT in the last 72 hours. Sepsis Labs: Recent Labs  Lab 07/15/19 [id number] 07/15/19 1028 07/16/19 0941 07/18/19 0320  PROCALCITON 0.17  --  0.12  --   LATICACIDVEN 1.2 1.5 1.9 1.2    Recent Results (from the past 240 hour(s))  Culture, blood (routine x 2)     Status: None   Collection Time: 07/15/19  6:52 AM   Specimen: BLOOD  Result Value Ref Range Status   Specimen Description BLOOD RIGHT ANTECUBITAL  Final   Special Requests   Final    BOTTLES DRAWN AEROBIC ONLY Blood Culture results may not be optimal due to an inadequate volume of blood received in culture bottles   Culture   Final    NO GROWTH 5 DAYS Performed at [facility], [place] [address], [place], [place] [address]    Report Status 07/20/2019 FINAL  Final  Culture, blood (routine x 2)     Status: None   Collection Time: 07/15/19  7:01 AM   Specimen: BLOOD RIGHT ARM  Result Value Ref Range Status   Specimen Description BLOOD RIGHT ARM  Final   Special Requests   Final    BOTTLES  DRAWN AEROBIC ONLY Blood Culture adequate volume   Culture   Final    NO GROWTH 5 DAYS Performed at [facility], [place] [address], [place], [place] [address]    Report Status 07/20/2019 FINAL  Final  SARS CORONAVIRUS 2 (TAT 6-24 HRS) Nasopharyngeal Nasopharyngeal Swab     Status: None   Collection Time: 07/15/19  9:13 AM   Specimen: Nasopharyngeal Swab  Result Value Ref Range Status   SARS Coronavirus 2 NEGATIVE NEGATIVE Final    Comment: (NOTE) SARS-CoV-2 target nucleic acids are NOT DETECTED. The SARS-CoV-2 RNA is generally detectable in upper and  lower respiratory specimens during the acute phase of infection. Negative results do not preclude SARS-CoV-2 infection, do not rule out co-infections with other pathogens, and should not be used as the sole basis for treatment or other patient management decisions. Negative results must be combined with clinical observations, patient history, and epidemiological information. The expected result is Negative. Fact Sheet for Patients: SugarRoll.be Fact Sheet for Healthcare Providers: https://www.woods-mathews.com/ This test is not yet approved or cleared by the [place] FDA and  has been authorized for detection and/or diagnosis of SARS-CoV-2 by FDA under an Emergency Use Authorization (EUA). This EUA will remain  in effect (meaning this test can be used) for the duration of the COVID-19 declaration under Section 56 4(b)(1) of the Act, 21 U.S.C. section 360bbb-3(b)(1), unless the authorization is terminated or revoked sooner. Performed at [facility], [place] [address] [place], [place] [address]   MRSA PCR Screening     Status: None   Collection Time: 07/16/19 12:05 PM   Specimen: Nasal Mucosa; Nasopharyngeal  Result Value Ref Range Status   MRSA by PCR NEGATIVE NEGATIVE Final    Comment:        The GeneXpert MRSA Assay (FDA approved for NASAL specimens only), is one component  of a comprehensive MRSA colonization surveillance program. It is not intended to diagnose MRSA infection nor to guide or monitor treatment for MRSA infections. Performed at [facility], [place] [address] [place], [place] [address]       Radiology Studies: CT HEAD WO CONTRAST  Result Date: 07/19/2019 CLINICAL DATA:  Recent large stroke; mental status change, unknown cause. EXAM: CT HEAD WITHOUT CONTRAST TECHNIQUE: Contiguous axial images were obtained from the base of the skull through the vertex without intravenous contrast. COMPARISON:  Brain MRI 06/19/2019 FINDINGS: Brain: Redemonstrated, now subacute, large left PCA territory infarct. As before, this infarct involves the medial aspects of the left temporal and occipital lobes, left thalamus, posterior limb of left internal capsule and splenium of corpus callosum. Involvement of the left mamillary body and left cerebral peduncle were better appreciated on prior MRI 06/19/2019. There is now petechial hemorrhage and/or cortical laminar necrosis within portions of the paramedian left parietooccipital lobe infarction territory (for instance as seen on series 3, image 16). A small subacute cortical infarct involving the left frontal lobe precentral gyrus was better appreciated on prior MRI. Redemonstrated chronic infarcts within the right temporal and occipital lobes. Also redemonstrated are chronic lacunar infarcts within the bilateral cerebellar hemispheres. No new demarcated infarct is identified. No midline shift or extra-axial fluid collection. Stable mild generalized parenchymal atrophy with minimal background chronic small vessel ischemic disease. Vascular: No hyperdense vessel.  Atherosclerotic calcifications. Skull: Normal. Negative for fracture or focal lesion. Sinuses/Orbits: Visualized orbits demonstrate no acute abnormality. No significant paranasal sinus disease or mastoid effusion at the imaged levels. IMPRESSION: Large, now subacute,  left PCA territory infarct. Petechial hemorrhage and/or cortical laminar necrosis is now present within portions of the paramedian left parietooccipital infarction territory. A small, now subacute, cortical infarct within the left frontal lobe precentral gyrus was better appreciated on prior MRI 06/19/2019. Redemonstrated chronic infarcts within the right temporal and occipital lobes and bilateral cerebellar hemispheres. Mild generalized parenchymal atrophy. Minimal background chronic small vessel ischemic disease. Electronically Signed   By: Kellie Simmering DO   On: 07/19/2019 16:56   DG CHEST PORT 1 VIEW  Result Date: 07/19/2019 CLINICAL DATA:  Respiratory failure. EXAM: PORTABLE CHEST 1 VIEW COMPARISON:  Chest  x-ray dated July 16, 2019. FINDINGS: Stable cardiomediastinal silhouette. Improving consolidation in the right upper lobe. Additional patchy airspace disease in the left upper and mid lung has mildly improved as well. No pneumothorax or large pleural effusion. No acute osseous abnormality. IMPRESSION: Improving multifocal pneumonia. Electronically Signed   By: Titus Dubin M.D.   On: 07/19/2019 11:40        Scheduled Meds: . amiodarone  200 mg Oral BID  . aspirin  324 mg Per Tube Daily  . atorvastatin  80 mg Per Tube q1800  . chlorhexidine  15 mL Mouth Rinse BID  . clopidogrel  75 mg Per Tube Daily  . enoxaparin (LOVENOX) injection  40 mg Subcutaneous Q24H  . feeding supplement (PRO-STAT SUGAR FREE 64)  30 mL Oral BID  . free water  200 mL Per Tube Q8H  . furosemide  40 mg Oral Daily  . [START ON 07/21/2019] influenza vaccine adjuvanted  0.5 mL Intramuscular Tomorrow-1000  . insulin aspart  0-9 Units Subcutaneous Q4H  . labetalol  10 mg Intravenous Once  . mouth rinse  15 mL Mouth Rinse q12n4p  . methylphenidate  5 mg Per Tube BID WC   Continuous Infusions: . ampicillin-sulbactam (UNASYN) IV 1.5 g (07/20/19 1657)  . feeding supplement (JEVITY 1.5 CAL/FIBER) 1,000 mL (07/19/19  1628)    Assessment & Plan:   1.Acute hypoxic respiratory failure and sepsis secondary to aspiration pneumonia.Patient remains at risk for recurrent aspiration.  Tube feeding held on the night of 2/7 in  concern for respiratory distress/BiPAP.ABG shows improvement in PCO2 level to 50s (was in the 70s last week), resumed tube feeding at low rate while following strict aspiration precautions-titrate up as tolerated.  Repeat chest x-ray shows mild improvement in right upper lobe and left basilar infiltrates.MRSA PCR negative-off vancomycin.Patient was on IV Zosyn, transitioned to IV Unasyn -day 6/7 today.Patient received IV Lasix on admission and on 2/7, now on oral Lasix-watch blood pressures, renal function and sodium levels.   2.Recent CVA with residual weakness and associated encephalopathy with declining mental status-Patient had large acute left PCA infarct, small acute left frontal lobe infarct. He had residual dysphagia requiring PEG tube placement.Continue home medications-antiplatelet ( ASA and plavix DAPT for 3 months and then ASA alone)and statins.  Repeat CT head on 2/8 showed-baseline atrophy, subacute large PCA/small cortical infarcts. Petechial hemorrhage and/or cortical laminar necrosis now noted within portions of the left parietooccipital infarction territory.  Since his mental status is improved today, will continue dual antiplatelet therapy for now.  Neurochecks.  Family understands the risk of stroke versus bleed.  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 Chronic occlusion of the right PCA. 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 Vascular surgery follow up.  Trying to avoid low BP but his multiple medical conditions preclude optimal blood pressure maintenance (given high-grade stenosis would  prefer permissive hypertension with ideally systolic around 0000000.)  4.Recent Acute inferior STEMI with Acute on Chronic systolic, diastolic heart failure with pulmonary edema-Medically managed January 2021. Resume prior meds, lasix, aspirin Plavix and statins.Held beta-blockers in concern for sepsis/hypotension.  Repeat echocardiogram 1/15 EF 30-35%, grade III diastolic dysfunction.  Patient not a candidate for further interventions like cath per cardiology.  5.  New onset A. fib with RVR, paroxysmal: Beta-blockers held in concern for hypotension, patient however had new onset  A. fib overnight and received IV labetalol x1.  Also started on gtt. amiodarone.  In normal sinus rhythm now.  Seen by cardiology and transition to amiodarone via PEG.  They do not feel patient is a candidate for anticoagulation but hoping he will stay in sinus rhythm on amiodarone.  Cardiology encouraged care goal discussions with family given overall poor prognosis  5.CKD III a :Renal function for now at baseline, creatinine stable around 1.2 to 1.4.Monitor while being diuresed and dealing with sepsis  6.  Hypokalemia/Mild hypernatremia-potassium replacement ordered again today.  Magnesium greater than 2.. Increased free water via PEG and sodium level appears to be downtrending. Monitor on Lasix (on 40mg -will reduce to 20 mg given hypernatremia to avoid intravascular volume depletion.) and daily labs  7. LFT elevation -Likely congestion vs sepsis related injury.Stable On lipitor due to acute stroke. Now improving.  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  9.?  Right greater than left upper extremity swelling: Will obtain Doppler to rule out DVT.  Patient already on dual antiplatelet agents.  10.  FEN with moderate protein calorie malnutrition: Patient on PEG feeds due to dysphagia from recent stroke.  Albumin level 2.3.  Tube feeds held on the night of 2/7 and concern for acute  respiratory distress/aspiration.  Resumed feeds at low rate 20 mL/h yesterday and tolerating well-we will increase rate to 40 mL/h and monitor.  Goals of care: Patient remains at high risk for recurrent aspiration, recurrent strokes and arrhythmias.  Discussed with healthcare proxy/fianc Ms. Levi Pruitt as well as daughter Levi Pruitt at bedside yesterday.  Attended conference call with family members (daughter Levi Pruitt, significant other Levi Pruitt and patient's twin sister Levi Pruitt and brother-in-law) as well as palliative care nurse Stanton Kidney.  Family hopeful that he will continue to recover from stroke.  They would like to continue current level of care.  They were appreciative of medical updates.  Palliative care team continued discussion with family after my departure--will update me of any changes.   DVT prophylaxis: Heparin Code Status: DNI Family / Patient Communication: Discussed with family in detail as outlined above. Disposition Plan: Likely back to acute rehab if continues to do well in next 48 to 72 hours.  If declines, may need to readdress care goals with family.  Palliative care team will continue to follow along.  Family requests updates from MD in a.m.     LOS: 5 days    Time spent: 45 minutes.     Guilford Shi, MD [facility] Pager in [place]  If 7PM-7AM, please contact night-coverage [email or web address] Password [facility] 07/20/2019, 5:04 PM

## 2019-07-20 NOTE — Evaluation (Signed)
Occupational Therapy Evaluation Patient Details Name: Levi Pruitt MRN: [id number] DOB: [birth year] Today's Date: 07/20/2019    History of Present Illness Levi Pruitt is a 74 y.o. male  with recent CVA with right-sided hemiparesis, dysphagia requiring PEG tube, complicated with inferior wall, sever carotid artery diseasewho discharged to CIR last about 2 weeks ago and then transferred back to the floor from inpatient rehab due to respiratory failure. He found to have multifocal PNA likely secondary to aspiration dye to dysphagia after stroke. He is currently requiring BiPAP. No significant PMH on file.   Clinical Impression   Pt with decline in function and safety with ADLs and ADL mobility with impaired strength, balance, endurance, R UE ROM/function (hemiparesis and edema), visual impairments, and cognitive impairments. PTA patient independent and was working. Pt is flaccid on R side, no response noted to stimuli;  R inattention and neglect. He is able to follow simple 1 step commands inconsistently given increased time and attempts to verbalizes several times during session. Pt requires extensive max - total A for ADLs/selfcare. Pt would benefit from acute OT services to address impairments to maximize level of function and safetty    Follow Up Recommendations  CIR;Supervision/Assistance - 24 hour    Equipment Recommendations  Other (comment)(TBD at next venue of care)    Recommendations for Other Services       Precautions / Restrictions Precautions Precautions: Fall Precaution Comments: right hemiparesis, right inattention, right visual field deficit Restrictions Weight Bearing Restrictions: No      Mobility Bed Mobility Overal bed mobility: Needs Assistance Bed Mobility: Rolling;Sidelying to Sit;Sit to Supine;Sit to Sidelying Rolling: Max assist;+2 for physical assistance Sidelying to sit: Max assist;+2 for safety/equipment Supine to sit: Max assist;+2 for physical  assistance Sit to supine: Total assist;+2 for physical assistance   General bed mobility comments: pt too fatigued. Per PT note, pt total - max A +2 for bed mobility  Transfers Overall transfer level: (unable to perform today - safety/medical)               General transfer comment: unable to attempt    Balance Overall balance assessment: Needs assistance Sitting-balance support: Feet supported;Single extremity supported;No upper extremity supported Sitting balance-Leahy Scale: Poor Sitting balance - Comments: Poor sitting balance per PT note, Posterior and R lean Postural control: Posterior lean;Right lateral lean                                 ADL either performed or assessed with clinical judgement   ADL Overall ADL's : Needs assistance/impaired     Grooming: Wash/dry hands;Wash/dry face;Bed level;Moderate assistance   Upper Body Bathing: Maximal assistance;Bed level Upper Body Bathing Details (indicate cue type and reason): simulated, verbalm and physical cues required Lower Body Bathing: Total assistance   Upper Body Dressing : Total assistance   Lower Body Dressing: Total assistance       Toileting- Clothing Manipulation and Hygiene: Total assistance;+2 for safety/equipment;+2 for physical assistance;Bed level Toileting - Clothing Manipulation Details (indicate cue type and reason): total assist for bed level hygiene        General ADL Comments: requires max-total assist for all self care     Vision Baseline Vision/History: Wears glasses Wears Glasses: At all times Vision Assessment?: Vision impaired- to be further tested in functional context Additional Comments: impaired tracking, R visual field deficits, R neglect, R inattention     Perception  Praxis      Pertinent Vitals/Pain Pain Assessment: Faces Pain Score: 0-No pain Faces Pain Scale: No hurt Pain Intervention(s): Monitored during session     Hand Dominance Right    Extremity/Trunk Assessment Upper Extremity Assessment Upper Extremity Assessment: Generalized weakness;RUE deficits/detail RUE Deficits / Details: flaccid RUE Sensation: decreased proprioception;decreased light touch RUE Coordination: decreased gross motor;decreased fine motor LUE Sensation: WNL LUE Coordination: WNL   Lower Extremity Assessment Lower Extremity Assessment: Generalized weakness;RLE deficits/detail;LLE deficits/detail RLE Deficits / Details: no active movement noted, hemiparesis RLE Sensation: decreased light touch RLE Coordination: decreased fine motor;decreased gross motor LLE Deficits / Details: grossly 3-/5 throughout LLE Sensation: WNL       Communication Communication Communication: Receptive difficulties;Expressive difficulties   Cognition Arousal/Alertness: Awake/alert Behavior During Therapy: Flat affect Overall Cognitive Status: Impaired/Different from baseline Area of Impairment: Attention;Safety/judgement;Awareness;Problem solving                 Orientation Level: Time;Situation;Place Current Attention Level: Sustained Memory: Decreased short-term memory Following Commands: Follows one step commands consistently;Follows one step commands with increased time Safety/Judgement: Decreased awareness of safety;Decreased awareness of deficits Awareness: Intellectual Problem Solving: Slow processing;Requires verbal cues General Comments: following one step commands. Mumbles brief sentences, a few words. Daughter present   General Comments  on 4L O2/min via [place] throughout session with SpO2 >93 % and HR in 60s    Exercises     Shoulder Instructions      Home Living Family/patient expects to be discharged to:: Private residence Living Arrangements: Alone Available Help at Discharge: Family;Friend(s);Available PRN/intermittently Type of Home: House Home Access: Stairs to enter [employer] of Steps: couple  Entrance Stairs-Rails:  Right;Left Home Layout: One level     Bathroom Shower/Tub: Teacher, early years/pre: Standard     Home Equipment: None   Additional Comments: daughter lives in [place], long time girlfriend works but available PRN, unsure of home setup as pt reported having a multilevel house  Lives With: Alone    Prior Functioning/Environment Level of Independence: Independent        Comments: prior to hospital admission- independent, driving, working Biochemist, clinical) - all of the above information retrieved from chart review due to pt's cognitive impairments        OT Problem List: Decreased strength;Decreased range of motion;Decreased activity tolerance;Impaired balance (sitting and/or standing);Impaired vision/perception;Decreased coordination;Decreased cognition;Decreased safety awareness;Decreased knowledge of use of DME or AE;Decreased knowledge of precautions;Cardiopulmonary status limiting activity;Impaired sensation;Impaired tone;Impaired UE functional use;Increased edema      OT Treatment/Interventions: Self-care/ADL training;Neuromuscular education;Therapeutic activities;Balance training;Patient/family education;Visual/perceptual remediation/compensation;Cognitive remediation/compensation;Splinting;DME and/or AE instruction    OT Goals(Current goals can be found in the care plan section) Acute Rehab OT Goals Patient Stated Goal: When asked pt saying "yes" he would like to go back to CIR OT Goal Formulation: With patient/family Time For Goal Achievement: 08/03/19 Potential to Achieve Goals: Fair  OT Frequency: Min 2X/week   Barriers to D/C:            Co-evaluation              AM-PAC OT "6 Clicks" Daily Activity     Outcome Measure Help from another person eating meals?: Total Help from another person taking care of personal grooming?: A Lot Help from another person toileting, which includes using toliet, bedpan, or urinal?: Total Help from another person bathing  (including washing, rinsing, drying)?: Total Help from another person to put on and taking off regular upper body clothing?: Total Help  from another person to put on and taking off regular lower body clothing?: Total 6 Click Score: 7   End of Session    Activity Tolerance: Patient limited by fatigue Patient left: with call bell/phone within reach;in bed;with bed alarm set;with family/visitor present  OT Visit Diagnosis: Other abnormalities of gait and mobility (R26.89);Muscle weakness (generalized) (M62.81);Hemiplegia and hemiparesis;Cognitive communication deficit (R41.841);Other symptoms and signs involving cognitive function Symptoms and signs involving cognitive functions: Cerebral infarction Hemiplegia - Right/Left: Right Hemiplegia - dominant/non-dominant: Dominant Hemiplegia - caused by: Cerebral infarction                Time: 1101-1120 OT Time Calculation (min): 19 min Charges:  OT General Charges $OT Visit: 1 Visit OT Evaluation $OT Eval Moderate Complexity: 1 Mod    Britt Bottom 07/20/2019, 3:24 PM

## 2019-07-21 ENCOUNTER — Other Ambulatory Visit: Payer: Self-pay

## 2019-07-21 ENCOUNTER — Encounter (HOSPITAL_COMMUNITY): Payer: Self-pay | Admitting: Physical Medicine & Rehabilitation

## 2019-07-21 ENCOUNTER — Inpatient Hospital Stay (HOSPITAL_COMMUNITY): Payer: Medicare Other

## 2019-07-21 ENCOUNTER — Inpatient Hospital Stay (HOSPITAL_COMMUNITY)
Admission: RE | Admit: 2019-07-21 | Discharge: 2019-07-22 | DRG: 056 | Disposition: A | Discharge status: Other Acute Inpatient Hospital | Payer: Medicare Other | Source: Intra-hospital | Attending: Physical Medicine & Rehabilitation | Admitting: Physical Medicine & Rehabilitation

## 2019-07-21 DIAGNOSIS — Z6830 Body mass index (BMI) 30.0-30.9, adult: Secondary | ICD-10-CM | POA: Diagnosis not present

## 2019-07-21 DIAGNOSIS — Z8249 Family history of ischemic heart disease and other diseases of the circulatory system: Secondary | ICD-10-CM | POA: Diagnosis not present

## 2019-07-21 DIAGNOSIS — Z931 Gastrostomy status: Secondary | ICD-10-CM

## 2019-07-21 DIAGNOSIS — I69391 Dysphagia following cerebral infarction: Secondary | ICD-10-CM

## 2019-07-21 DIAGNOSIS — I214 Non-ST elevation (NSTEMI) myocardial infarction: Secondary | ICD-10-CM | POA: Diagnosis present

## 2019-07-21 DIAGNOSIS — I69392 Facial weakness following cerebral infarction: Secondary | ICD-10-CM | POA: Diagnosis not present

## 2019-07-21 DIAGNOSIS — Z7982 Long term (current) use of aspirin: Secondary | ICD-10-CM | POA: Diagnosis not present

## 2019-07-21 DIAGNOSIS — I2510 Atherosclerotic heart disease of native coronary artery without angina pectoris: Secondary | ICD-10-CM | POA: Diagnosis present

## 2019-07-21 DIAGNOSIS — R7401 Elevation of levels of liver transaminase levels: Secondary | ICD-10-CM | POA: Diagnosis present

## 2019-07-21 DIAGNOSIS — J9692 Respiratory failure, unspecified with hypercapnia: Secondary | ICD-10-CM

## 2019-07-21 DIAGNOSIS — I69351 Hemiplegia and hemiparesis following cerebral infarction affecting right dominant side: Secondary | ICD-10-CM | POA: Diagnosis present

## 2019-07-21 DIAGNOSIS — N179 Acute kidney failure, unspecified: Secondary | ICD-10-CM | POA: Diagnosis present

## 2019-07-21 DIAGNOSIS — R1310 Dysphagia, unspecified: Secondary | ICD-10-CM

## 2019-07-21 DIAGNOSIS — Z79899 Other long term (current) drug therapy: Secondary | ICD-10-CM

## 2019-07-21 DIAGNOSIS — R0600 Dyspnea, unspecified: Secondary | ICD-10-CM

## 2019-07-21 DIAGNOSIS — I69322 Dysarthria following cerebral infarction: Secondary | ICD-10-CM

## 2019-07-21 DIAGNOSIS — Z9981 Dependence on supplemental oxygen: Secondary | ICD-10-CM

## 2019-07-21 DIAGNOSIS — I63532 Cerebral infarction due to unspecified occlusion or stenosis of left posterior cerebral artery: Secondary | ICD-10-CM | POA: Diagnosis present

## 2019-07-21 DIAGNOSIS — I428 Other cardiomyopathies: Secondary | ICD-10-CM | POA: Diagnosis present

## 2019-07-21 DIAGNOSIS — E669 Obesity, unspecified: Secondary | ICD-10-CM | POA: Diagnosis present

## 2019-07-21 DIAGNOSIS — J9602 Acute respiratory failure with hypercapnia: Secondary | ICD-10-CM | POA: Diagnosis present

## 2019-07-21 DIAGNOSIS — I4891 Unspecified atrial fibrillation: Secondary | ICD-10-CM

## 2019-07-21 DIAGNOSIS — I69320 Aphasia following cerebral infarction: Secondary | ICD-10-CM | POA: Diagnosis not present

## 2019-07-21 DIAGNOSIS — I482 Chronic atrial fibrillation, unspecified: Secondary | ICD-10-CM | POA: Diagnosis present

## 2019-07-21 DIAGNOSIS — I69319 Unspecified symptoms and signs involving cognitive functions following cerebral infarction: Secondary | ICD-10-CM

## 2019-07-21 DIAGNOSIS — I69390 Apraxia following cerebral infarction: Secondary | ICD-10-CM | POA: Diagnosis not present

## 2019-07-21 DIAGNOSIS — I5043 Acute on chronic combined systolic (congestive) and diastolic (congestive) heart failure: Secondary | ICD-10-CM | POA: Diagnosis not present

## 2019-07-21 DIAGNOSIS — Z794 Long term (current) use of insulin: Secondary | ICD-10-CM | POA: Diagnosis not present

## 2019-07-21 DIAGNOSIS — Z9189 Other specified personal risk factors, not elsewhere classified: Secondary | ICD-10-CM

## 2019-07-21 DIAGNOSIS — Z7902 Long term (current) use of antithrombotics/antiplatelets: Secondary | ICD-10-CM

## 2019-07-21 DIAGNOSIS — I63439 Cerebral infarction due to embolism of unspecified posterior cerebral artery: Secondary | ICD-10-CM

## 2019-07-21 DIAGNOSIS — R609 Edema, unspecified: Secondary | ICD-10-CM

## 2019-07-21 LAB — GLUCOSE, CAPILLARY
Glucose-Capillary: 107 mg/dL — ABNORMAL HIGH (ref 70–99)
Glucose-Capillary: 114 mg/dL — ABNORMAL HIGH (ref 70–99)
Glucose-Capillary: 123 mg/dL — ABNORMAL HIGH (ref 70–99)
Glucose-Capillary: 131 mg/dL — ABNORMAL HIGH (ref 70–99)
Glucose-Capillary: 139 mg/dL — ABNORMAL HIGH (ref 70–99)
Glucose-Capillary: 156 mg/dL — ABNORMAL HIGH (ref 70–99)

## 2019-07-21 LAB — HEPATIC FUNCTION PANEL
ALT: 283 U/L — ABNORMAL HIGH (ref 0–44)
AST: 200 U/L — ABNORMAL HIGH (ref 15–41)
Albumin: 2.3 g/dL — ABNORMAL LOW (ref 3.5–5.0)
Alkaline Phosphatase: 113 U/L (ref 38–126)
Bilirubin, Direct: 0.1 mg/dL (ref 0.0–0.2)
Indirect Bilirubin: 0.4 mg/dL (ref 0.3–0.9)
Total Bilirubin: 0.5 mg/dL (ref 0.3–1.2)
Total Protein: 5.6 g/dL — ABNORMAL LOW (ref 6.5–8.1)

## 2019-07-21 LAB — BASIC METABOLIC PANEL
Anion gap: 10 (ref 5–15)
BUN: 34 mg/dL — ABNORMAL HIGH (ref 8–23)
CO2: 33 mmol/L — ABNORMAL HIGH (ref 22–32)
Calcium: 8.5 mg/dL — ABNORMAL LOW (ref 8.9–10.3)
Chloride: 104 mmol/L (ref 98–111)
Creatinine, Ser: 1.22 mg/dL (ref 0.61–1.24)
GFR calc Af Amer: >60 mL/min (ref >60)
GFR calc non Af Amer: 58 mL/min — ABNORMAL LOW (ref >60)
Glucose, Bld: 146 mg/dL — ABNORMAL HIGH (ref 70–99)
Potassium: 4 mmol/L (ref 3.5–5.1)
Sodium: 147 mmol/L — ABNORMAL HIGH (ref 135–145)

## 2019-07-21 LAB — CBC
HCT: 39.5 % (ref 39.0–52.0)
Hemoglobin: 12.3 g/dL — ABNORMAL LOW (ref 13.0–17.0)
MCH: 30.7 pg (ref 26.0–34.0)
MCHC: 31.1 g/dL (ref 30.0–36.0)
MCV: 98.5 fL (ref 80.0–100.0)
Platelets: 284 10*3/uL (ref 150–400)
RBC: 4.01 MIL/uL — ABNORMAL LOW (ref 4.22–5.81)
RDW: 14.1 % (ref 11.5–15.5)
WBC: 11.5 10*3/uL — ABNORMAL HIGH (ref 4.0–10.5)
nRBC: 0 % (ref 0.0–0.2)

## 2019-07-21 MED ORDER — FLEET ENEMA 7-19 GM/118ML RE ENEM: 1.0000 | ENEMA | Freq: Once | RECTAL | Status: DC | PRN

## 2019-07-21 MED ORDER — FREE WATER
200.0000 mL | Freq: Four times a day (QID) | Status: DC
  Administered 2019-07-21 – 2019-07-22 (×3): 200 mL

## 2019-07-21 MED ORDER — IPRATROPIUM-ALBUTEROL 0.5-2.5 (3) MG/3ML IN SOLN: 3.0000 mL | Freq: Four times a day (QID) | RESPIRATORY_TRACT | Status: DC | PRN

## 2019-07-21 MED ORDER — ENOXAPARIN SODIUM 40 MG/0.4ML ~~LOC~~ SOLN: 40.0000 mg | SUBCUTANEOUS | Status: DC

## 2019-07-21 MED ORDER — ATORVASTATIN CALCIUM 80 MG PO TABS
80.0000 mg | ORAL_TABLET | Freq: Every day | ORAL | Status: DC
  Administered 2019-07-21: 80 mg
  Filled 2019-07-21: qty 1

## 2019-07-21 MED ORDER — CHLORHEXIDINE GLUCONATE 0.12 % MT SOLN: 15.0000 mL | Freq: Two times a day (BID) | OROMUCOSAL | Status: DC

## 2019-07-21 MED ORDER — AMIODARONE HCL 200 MG PO TABS: 200.0000 mg | ORAL_TABLET | Freq: Two times a day (BID) | ORAL | Status: DC

## 2019-07-21 MED ORDER — DIPHENHYDRAMINE HCL 12.5 MG/5ML PO ELIX: 12.5000 mg | ORAL_SOLUTION | Freq: Four times a day (QID) | ORAL | Status: DC | PRN

## 2019-07-21 MED ORDER — FUROSEMIDE 20 MG PO TABS
20.0000 mg | ORAL_TABLET | Freq: Every day | ORAL | Status: DC
  Administered 2019-07-22: 20 mg via ORAL
  Filled 2019-07-21: qty 1

## 2019-07-21 MED ORDER — FUROSEMIDE 40 MG PO TABS: 20.0000 mg | ORAL_TABLET | Freq: Every day | ORAL | Status: DC

## 2019-07-21 MED ORDER — GUAIFENESIN-DM 100-10 MG/5ML PO SYRP: 5.0000 mL | ORAL_SOLUTION | Freq: Four times a day (QID) | ORAL | Status: DC | PRN

## 2019-07-21 MED ORDER — ORAL CARE MOUTH RINSE: 15.0000 mL | Freq: Two times a day (BID) | OROMUCOSAL | Status: DC

## 2019-07-21 MED ORDER — ASPIRIN 81 MG PO CHEW
324.0000 mg | CHEWABLE_TABLET | Freq: Every day | ORAL | Status: DC
  Administered 2019-07-22: 324 mg
  Filled 2019-07-21: qty 4

## 2019-07-21 MED ORDER — JEVITY 1.5 CAL/FIBER PO LIQD
1000.0000 mL | ORAL | Status: DC
  Administered 2019-07-21 – 2019-07-22 (×2): 1000 mL
  Filled 2019-07-21: qty 1000

## 2019-07-21 MED ORDER — SENNOSIDES-DOCUSATE SODIUM 8.6-50 MG PO TABS: 2.0000 | ORAL_TABLET | Freq: Every evening | ORAL | Status: DC | PRN

## 2019-07-21 MED ORDER — ONDANSETRON HCL 4 MG/2ML IJ SOLN: 4.0000 mg | Freq: Four times a day (QID) | INTRAMUSCULAR | Status: DC | PRN

## 2019-07-21 MED ORDER — ALUM & MAG HYDROXIDE-SIMETH 200-200-20 MG/5ML PO SUSP: 30.0000 mL | ORAL | Status: DC | PRN

## 2019-07-21 MED ORDER — MELATONIN 3 MG PO TABS
6.0000 mg | ORAL_TABLET | Freq: Every evening | ORAL | Status: DC | PRN
  Filled 2019-07-21: qty 2

## 2019-07-21 MED ORDER — FREE WATER: 200.0000 mL | Freq: Four times a day (QID) | Status: DC

## 2019-07-21 MED ORDER — CLOPIDOGREL BISULFATE 75 MG PO TABS
75.0000 mg | ORAL_TABLET | Freq: Every day | ORAL | Status: DC
  Administered 2019-07-22: 75 mg
  Filled 2019-07-21: qty 1

## 2019-07-21 MED ORDER — INFLUENZA VAC A&B SA ADJ QUAD 0.5 ML IM PRSY: 0.5000 mL | PREFILLED_SYRINGE | INTRAMUSCULAR | Status: DC

## 2019-07-21 MED ORDER — BISACODYL 10 MG RE SUPP: 10.0000 mg | Freq: Every day | RECTAL | Status: DC | PRN

## 2019-07-21 MED ORDER — INSULIN ASPART 100 UNIT/ML ~~LOC~~ SOLN
0.0000 [IU] | SUBCUTANEOUS | Status: DC
  Administered 2019-07-22: 1 [IU] via SUBCUTANEOUS
  Administered 2019-07-22: 2 [IU] via SUBCUTANEOUS

## 2019-07-21 MED ORDER — ONDANSETRON HCL 4 MG PO TABS: 4.0000 mg | ORAL_TABLET | Freq: Four times a day (QID) | ORAL | Status: DC | PRN

## 2019-07-21 MED ORDER — METHYLPHENIDATE HCL 5 MG PO TABS
5.0000 mg | ORAL_TABLET | Freq: Two times a day (BID) | ORAL | Status: DC
  Administered 2019-07-22: 5 mg
  Filled 2019-07-21: qty 1

## 2019-07-21 MED ORDER — AMIODARONE HCL 200 MG PO TABS
200.0000 mg | ORAL_TABLET | Freq: Two times a day (BID) | ORAL | Status: DC
  Administered 2019-07-21 – 2019-07-22 (×2): 200 mg via ORAL
  Filled 2019-07-21 (×2): qty 1

## 2019-07-21 MED ORDER — ACETAMINOPHEN 325 MG PO TABS: 325.0000 mg | ORAL_TABLET | ORAL | Status: DC | PRN

## 2019-07-21 MED ORDER — PRO-STAT SUGAR FREE PO LIQD
30.0000 mL | Freq: Two times a day (BID) | ORAL | Status: DC
  Administered 2019-07-21 – 2019-07-22 (×2): 30 mL via ORAL
  Filled 2019-07-21 (×2): qty 30

## 2019-07-21 NOTE — Progress Notes (Signed)
Patient ID: Levi Pruitt, male   DOB: 11-22-1945, 74 y.o.   MRN: [id number]  This NP visited patient at the bedside as a follow up  for Palliative  Medicine  needs and emotional support and to facilitate a family meeting as requested by [name],  with family and Dr Thora Lance  Conference call dialed at 1:30  Telephone conference call with family to include SO/[name]/[name], Margaret/sister, Alice/daughter, and Therman/son for continued conversation regarding diagnosis, prognosis, treatment option decisions, advanced directive decisions, EOL wishes, disposition and anticipatory care needs.  Dr Thora Lance gave detailed overall summary of his medical condition and hospitalization since admission on June 18, 2019 with stroke.  Questions and concerns addressed.   A discussion was had regarding the likely long term poor prognoses for return to meaningful recovery and his high risk for decompensation, discussed specifically within the contact of his AD/Living Will Patient has a documented Declaration of a Desire for a Natural Death   Conversation was hard at times, explaining to family and helping them understand  the limitations of medical interventions to prolong quality of life when a body begins to fail to thrive Plan of Care: -Limited code-  No intubation        --encouraged family to consider DNR/DNI  understanding evidenced based  poor outcomes in similar patients -   [name]/ family is open to all offered and available medical interventions to prolong life.  They are hopeful for improvement  Discussed with family / [name] the importance of continued conversation with each other  and the medical providers regarding overall plan of care and treatment options,  ensuring decisions are within the context of the patients values and GOCs.   Education and emotional support offered  Questions and concerns addressed   Discussed with Dr Thora Lance   Total time spent on the unit was 60 minutes  This nurse  practitioner informed  the patient/family and the attending that I will be out of the hospital after tomorrow thru Monday morning.  If the patient is still hospitalized I will follow-up at that time.  Call palliative medicine team phone # [phone] with questions or concerns.  Greater than 50% of the time was spent in counseling and coordination of care  [name] NP  Palliative Medicine Team Team Phone # [phone] Pager [phone]

## 2019-07-21 NOTE — H&P (Signed)
Physical Medicine and Rehabilitation Admission H&P    CC: Stroke with functional deficits.    HPI: Levi Pruitt is a 74 year old male without medical care for 40+years but in relatively good health who was admitted on 06/18/2019.  History taken from chart review due to aphasia. He was found down with right facial droop, right hemiparesis and dysarthria. Work up revealed acute large right PCA infarct, small left parietal infarct. Echo showed cardiomyopathy with EF of 25-30%. He was found to have NSTEMI. Hospital course significant for recurrent episodes of aspiration PNA with difficulty handling secretions as well as bouts of lethargy. PEG placed for nutritional support on 06/22/2019 by radiology and cardiology recommended medical management as patient not candidate for intervention to work up inferior wall NSTEMI.  Family elected on full scope of care and CIR recommended to reduce burden of care. Patient with resultant dense right hemiplegia, right inattention, oral apraxia, cognitive deficits with ongoing bouts of lethargy.   He was admitted to CIR on 07/07/2019 for intensive rehab program. He was found to have sleep wake disruption affecting, SOB felt to be due to fluid overload as well as mentation. He later developed A. Fib with RVR with hypotension on 07/15/2019.  He was transferred to acute hospital for management. He was started on IV amiodarone for rate control and IV antibiotics added due to sepsis due to recurrent aspiration PNA.  Acute on chronic combined CHF treated with IV diuresis and he required BIPAP due to acute respiratory failure. He has completed 5 day course IV antibiotics and weaned to 4L oxygen during the day with BIPAP at nights. Heart rate controlled on amiodarone bid X 1 week--.taper to 200 mg daily X 1 week then d/c. He continued to have waxing and waning of MS and follow up CT head 2/8 showed large subacute L-PCA infarct with petechial hemorrhage and/or cortical lamina necrosis  within portions of paramedian left parietoccipital infarct and small subacute cortical infarct in left frontal lobe.  RUE doppler done due to edema and revealed acute thrombus in cephalic vein at antecubital site likely due to DVT--to monitor. To continue DAPT. Cardiology recommended palliative consult for [place] and family elected on "NO intubation". Please see preadmission assessment from earlier today.   Review of Systems  Unable to perform ROS: Mental acuity     Past Medical History:  Diagnosis Date  . 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 IR GASTROSTOMY TUBE MOD SED  07/06/2019     Family History  Problem Relation Age of Onset  . Heart disease Father   . High blood pressure Sister        is his twin    Social History: Lives alone and independent PTA.  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 Has a girlfriend who lives in [place] and they see each other once a week. He does not use tobacco, ETOH or drugs.    Allergies: No Known Allergies    Medications Prior to Admission  Medication Sig Dispense Refill  . aspirin 81 MG chewable tablet Place 4 tablets (324 mg total) into feeding tube daily.    [name] atorvastatin (LIPITOR) 80 MG tablet Place 1 tablet (80 mg total) into feeding tube daily at 6 PM.    . carvedilol (COREG) 3.125 MG tablet Place 1 tablet (3.125 mg total) into feeding tube 2 (two) times daily  with a meal.    . clopidogrel (PLAVIX) 75 MG tablet Place 1 tablet (75 mg total) into feeding tube daily.    [name] enoxaparin (LOVENOX) 40 MG/0.4ML injection Inject 0.4 mLs (40 mg total) into the skin daily. 0 mL   . insulin aspart (NOVOLOG) 100 UNIT/ML injection Inject 0-9 Units into the skin every 4 (four) hours. 10 mL 11  . ipratropium-albuterol (DUONEB) 0.5-2.5 (3) MG/3ML SOLN Take 3 mLs by nebulization every 6 (six) hours as needed. 360 mL   . Melatonin 3 MG TABS Take 2  tablets (6 mg total) by mouth at bedtime as needed (insomnia).  0  . methylphenidate (RITALIN) 5 MG tablet Take 1 tablet (5 mg total) by mouth 2 (two) times daily with breakfast and lunch.  0  . Nutritional Supplements (FEEDING SUPPLEMENT, JEVITY 1.5 CAL/FIBER,) LIQD Take 1,000 mLs by mouth continuous.    . [DISCONTINUED] furosemide (LASIX) 40 MG tablet Place 1 tablet (40 mg total) into feeding tube daily. 30 tablet   . [DISCONTINUED] Water For Irrigation, Sterile (FREE WATER) SOLN Place 150 mLs into feeding tube every 6 (six) hours.      Drug Regimen Review  Drug regimen was reviewed and remains appropriate with no significant issues identified  Home: Home Living Family/patient expects to be discharged to:: Unsure Living Arrangements: Alone Available Help at Discharge: Family, Friend(s), Available PRN/intermittently Type of Home: House Home Access: Stairs to enter Technical brewer of Steps: couple  Entrance Stairs-Rails: Right, Left Home Layout: One level Bathroom Shower/Tub: Chiropodist: Standard Home Equipment: None Additional Comments: daughter lives in [place], long time girlfriend works but available PRN, unsure of home setup as pt reported having a multilevel house  Lives With: Alone   Functional History: Prior Function Level of Independence: Independent Comments: prior to hospital admission- independent, driving, working Biochemist, clinical) - all of the above information retrieved from chart review due to pt's cognitive impairments  Functional Status:  Mobility: Bed Mobility Overal bed mobility: Needs Assistance Bed Mobility: Rolling, Sidelying to Sit, Sit to Supine, Sit to Sidelying Rolling: Max assist, +2 for physical assistance Sidelying to sit: Max assist, +2 for safety/equipment Supine to sit: Max assist, +2 for physical assistance Sit to supine: Total assist, +2 for physical assistance General bed mobility comments: pt too fatigued. Per PT note, pt  total - max A +2 for bed mobility Transfers Overall transfer level: (unable to perform today - safety/medical) General transfer comment: unable to attempt Ambulation/Gait General Gait Details: unable    ADL: ADL Overall ADL's : Needs assistance/impaired Grooming: Wash/dry hands, Wash/dry face, Bed level, Moderate assistance Upper Body Bathing: Maximal assistance, Bed level Upper Body Bathing Details (indicate cue type and reason): simulated, verbalm and physical cues required Lower Body Bathing: Total assistance Upper Body Dressing : Total assistance Lower Body Dressing: Total assistance Toileting- Clothing Manipulation and Hygiene: Total assistance, +2 for safety/equipment, +2 for physical assistance, Bed level Toileting - Clothing Manipulation Details (indicate cue type and reason): total assist for bed level hygiene  General ADL Comments: requires max-total assist for all self care  Cognition: Cognition Overall Cognitive Status: Impaired/Different from baseline Orientation Level: Oriented to person, Disoriented to place, Disoriented to time, Disoriented to situation Cognition Arousal/Alertness: Awake/alert Behavior During Therapy: Flat affect Overall Cognitive Status: Impaired/Different from baseline Area of Impairment: Attention, Safety/judgement, Awareness, Problem solving Orientation Level: Time, Situation, Place Current Attention Level: Sustained Memory: Decreased short-term memory Following Commands: Follows one step commands consistently, Follows one step commands with increased  time Safety/Judgement: Decreased awareness of safety, Decreased awareness of deficits Awareness: Intellectual Problem Solving: Slow processing, Requires verbal cues General Comments: following one step commands. Mumbles brief sentences, a few words. Daughter present Difficult to assess due to: Impaired communication  Physical Exam: Blood pressure (!) 104/58, pulse 60, temperature 98 F (36.7  C), temperature source Oral, resp. rate 20, weight 97.3 kg, SpO2 98 %. Physical Exam  Nursing note and vitals reviewed. Constitutional: He appears well-developed.  Obese  HENT:  Head: Normocephalic and atraumatic.  Eyes: Right eye exhibits no discharge. Left eye exhibits no discharge. No scleral icterus.  Neck: No tracheal deviation present. No thyromegaly present.  Respiratory: Effort normal.  +[place] +[place]  GI: Soft. He exhibits no distension.  PEG with dry dressing.   Musculoskeletal:        General: Edema (RUE with resolving ecchymosis. ) present. No tenderness.  Neurological: He is alert.  Right facial weakness Dysarthria  Not answering orientation questions Motor: Limited due to participation Dense right hemiplegia.  Increased tone noted in RLE. Right inattention  Unable to move right eye beyond midline.   Freely moving LUE, >/ 3/5 LLE: no movement noted Global aphasia  Skin: Skin is warm and dry.  Psychiatric:  Unable to assess due to mentation    Results for orders placed or performed during the hospital encounter of 07/15/19 (from the past 48 hour(s))  Glucose, capillary     Status: None   Collection Time: 07/19/19  5:14 PM  Result Value Ref Range   Glucose-Capillary 91 70 - 99 mg/dL  Glucose, capillary     Status: None   Collection Time: 07/19/19  7:56 PM  Result Value Ref Range   Glucose-Capillary 95 70 - 99 mg/dL  Glucose, capillary     Status: None   Collection Time: 07/19/19 11:28 PM  Result Value Ref Range   Glucose-Capillary 84 70 - 99 mg/dL  CBC     Status: Abnormal   Collection Time: 07/20/19  2:56 AM  Result Value Ref Range   WBC 13.3 (H) 4.0 - 10.5 K/uL   RBC 4.19 (L) 4.22 - 5.81 MIL/uL   Hemoglobin 12.9 (L) 13.0 - 17.0 g/dL   HCT 42.0 39.0 - 52.0 %   MCV 100.2 (H) 80.0 - 100.0 fL   MCH 30.8 26.0 - 34.0 pg   MCHC 30.7 30.0 - 36.0 g/dL   RDW 14.1 11.5 - 15.5 %   Platelets 305 150 - 400 K/uL   nRBC 0.0 0.0 - 0.2 %    Comment: Performed at  [facility], [place] [address] [place], [place] [id number]  Basic metabolic panel     Status: Abnormal   Collection Time: 07/20/19  2:56 AM  Result Value Ref Range   Sodium 146 (H) 135 - 145 mmol/L   Potassium 3.2 (L) 3.5 - 5.1 mmol/L   Chloride 99 98 - 111 mmol/L   CO2 37 (H) 22 - 32 mmol/L   Glucose, Bld 107 (H) 70 - 99 mg/dL   BUN 35 (H) 8 - 23 mg/dL   Creatinine, Ser 1.20 0.61 - 1.24 mg/dL   Calcium 8.5 (L) 8.9 - 10.3 mg/dL   GFR calc non Af Amer 60 (L) >60 mL/min   GFR calc Af Amer >60 >60 mL/min   Anion gap 10 5 - 15    Comment: Performed at [facility] [address], [place], [place] [address]  Hepatic function panel     Status:  Abnormal   Collection Time: 07/20/19  2:56 AM  Result Value Ref Range   Total Protein 5.9 (L) 6.5 - 8.1 g/dL   Albumin 2.3 (L) 3.5 - 5.0 g/dL   AST 177 (H) 15 - 41 U/L   ALT 220 (H) 0 - 44 U/L   Alkaline Phosphatase 109 38 - 126 U/L   Total Bilirubin 0.4 0.3 - 1.2 mg/dL   Bilirubin, Direct <0.1 0.0 - 0.2 mg/dL   Indirect Bilirubin NOT CALCULATED 0.3 - 0.9 mg/dL    Comment: Performed at [facility] [address], [place], [place] [address]  Magnesium     Status: None   Collection Time: 07/20/19  2:56 AM  Result Value Ref Range   Magnesium 2.3 1.7 - 2.4 mg/dL    Comment: Performed at [facility] [address], [place], [place] [address]  Glucose, capillary     Status: Abnormal   Collection Time: 07/20/19  4:25 AM  Result Value Ref Range   Glucose-Capillary 109 (H) 70 - 99 mg/dL  Glucose, capillary     Status: None   Collection Time: 07/20/19  8:19 AM  Result Value Ref Range   Glucose-Capillary 99 70 - 99 mg/dL  Glucose, capillary     Status: Abnormal   Collection Time: 07/20/19 11:39 AM  Result Value Ref Range   Glucose-Capillary 120 (H) 70 - 99 mg/dL  Potassium     Status: None   Collection Time: 07/20/19  6:28 PM  Result Value Ref Range   Potassium 4.2 3.5 - 5.1 mmol/L    Comment: DELTA CHECK NOTED NO  VISIBLE HEMOLYSIS Performed at [facility], [place] [address] [place], [place] [address]   Glucose, capillary     Status: None   Collection Time: 07/20/19  7:28 PM  Result Value Ref Range   Glucose-Capillary 99 70 - 99 mg/dL  Glucose, capillary     Status: Abnormal   Collection Time: 07/20/19 11:33 PM  Result Value Ref Range   Glucose-Capillary 118 (H) 70 - 99 mg/dL  CBC     Status: Abnormal   Collection Time: 07/21/19  3:53 AM  Result Value Ref Range   WBC 11.5 (H) 4.0 - 10.5 K/uL   RBC 4.01 (L) 4.22 - 5.81 MIL/uL   Hemoglobin 12.3 (L) 13.0 - 17.0 g/dL   HCT 39.5 39.0 - 52.0 %   MCV 98.5 80.0 - 100.0 fL   MCH 30.7 26.0 - 34.0 pg   MCHC 31.1 30.0 - 36.0 g/dL   RDW 14.1 11.5 - 15.5 %   Platelets 284 150 - 400 K/uL   nRBC 0.0 0.0 - 0.2 %    Comment: Performed at [facility], [place], [place], [place] [id number]  Basic metabolic panel     Status: Abnormal   Collection Time: 07/21/19  3:53 AM  Result Value Ref Range   Sodium 147 (H) 135 - 145 mmol/L   Potassium 4.0 3.5 - 5.1 mmol/L   Chloride 104 98 - 111 mmol/L   CO2 33 (H) 22 - 32 mmol/L   Glucose, Bld 146 (H) 70 - 99 mg/dL   BUN 34 (H) 8 - 23 mg/dL   Creatinine, Ser 1.22 0.61 - 1.24 mg/dL   Calcium 8.5 (L) 8.9 - 10.3 mg/dL   GFR calc non Af Amer 58 (L) >60 mL/min   GFR calc Af Amer >60 >60 mL/min   Anion gap 10 5 - 15  Comment: Performed at [facility], [place] [address], [place], [place] [address]  Hepatic function panel     Status: Abnormal   Collection Time: 07/21/19  3:53 AM  Result Value Ref Range   Total Protein 5.6 (L) 6.5 - 8.1 g/dL   Albumin 2.3 (L) 3.5 - 5.0 g/dL   AST 200 (H) 15 - 41 U/L   ALT 283 (H) 0 - 44 U/L   Alkaline Phosphatase 113 38 - 126 U/L   Total Bilirubin 0.5 0.3 - 1.2 mg/dL   Bilirubin, Direct 0.1 0.0 - 0.2 mg/dL   Indirect Bilirubin 0.4 0.3 - 0.9 mg/dL    Comment: Performed at [facility] [address], [place], [place] [address]  Glucose, capillary      Status: Abnormal   Collection Time: 07/21/19  3:56 AM  Result Value Ref Range   Glucose-Capillary 131 (H) 70 - 99 mg/dL  Glucose, capillary     Status: Abnormal   Collection Time: 07/21/19  7:52 AM  Result Value Ref Range   Glucose-Capillary 139 (H) 70 - 99 mg/dL  Glucose, capillary     Status: Abnormal   Collection Time: 07/21/19 12:05 PM  Result Value Ref Range   Glucose-Capillary 114 (H) 70 - 99 mg/dL   CT HEAD WO CONTRAST  Result Date: 07/19/2019 CLINICAL DATA:  Recent large stroke; mental status change, unknown cause. EXAM: CT HEAD WITHOUT CONTRAST TECHNIQUE: Contiguous axial images were obtained from the base of the skull through the vertex without intravenous contrast. COMPARISON:  Brain MRI 06/19/2019 FINDINGS: Brain: Redemonstrated, now subacute, large left PCA territory infarct. As before, this infarct involves the medial aspects of the left temporal and occipital lobes, left thalamus, posterior limb of left internal capsule and splenium of corpus callosum. Involvement of the left mamillary body and left cerebral peduncle were better appreciated on prior MRI 06/19/2019. There is now petechial hemorrhage and/or cortical laminar necrosis within portions of the paramedian left parietooccipital lobe infarction territory (for instance as seen on series 3, image 16). A small subacute cortical infarct involving the left frontal lobe precentral gyrus was better appreciated on prior MRI. Redemonstrated chronic infarcts within the right temporal and occipital lobes. Also redemonstrated are chronic lacunar infarcts within the bilateral cerebellar hemispheres. No new demarcated infarct is identified. No midline shift or extra-axial fluid collection. Stable mild generalized parenchymal atrophy with minimal background chronic small vessel ischemic disease. Vascular: No hyperdense vessel.  Atherosclerotic calcifications. Skull: Normal. Negative for fracture or focal lesion. Sinuses/Orbits: Visualized orbits  demonstrate no acute abnormality. No significant paranasal sinus disease or mastoid effusion at the imaged levels. IMPRESSION: Large, now subacute, left PCA territory infarct. Petechial hemorrhage and/or cortical laminar necrosis is now present within portions of the paramedian left parietooccipital infarction territory. A small, now subacute, cortical infarct within the left frontal lobe precentral gyrus was better appreciated on prior MRI 06/19/2019. Redemonstrated chronic infarcts within the right temporal and occipital lobes and bilateral cerebellar hemispheres. Mild generalized parenchymal atrophy. Minimal background chronic small vessel ischemic disease. Electronically Signed   By: Kellie Simmering DO   On: 07/19/2019 16:56   VAS [place] UPPER EXTREMITY VENOUS DUPLEX  Result Date: 07/21/2019 UPPER VENOUS STUDY  Indications: Edema Limitations: Line on forearm. Comparison Study: No prior exam. Performing Technologist: Baldwin Crown ARDMS, RVT  Examination Guidelines: A complete evaluation includes B-mode imaging, spectral Doppler, color Doppler, and power Doppler as needed of all accessible portions of each vessel. Bilateral testing is considered an  integral part of a complete examination. Limited examinations for reoccurring indications may be performed as noted.  Right Findings: +----------+------------+---------+-----------+----------+-------+ RIGHT     CompressiblePhasicitySpontaneousPropertiesSummary +----------+------------+---------+-----------+----------+-------+ IJV           Full       Yes       Yes                      +----------+------------+---------+-----------+----------+-------+ Subclavian    Full       Yes       Yes                      +----------+------------+---------+-----------+----------+-------+ Axillary      Full       Yes       Yes                      +----------+------------+---------+-----------+----------+-------+ Brachial      Full       Yes       Yes                       +----------+------------+---------+-----------+----------+-------+ Radial        Full                                          +----------+------------+---------+-----------+----------+-------+ Ulnar         Full                                          +----------+------------+---------+-----------+----------+-------+ Cephalic      None                                   Acute  +----------+------------+---------+-----------+----------+-------+ Basilic       Full                                          +----------+------------+---------+-----------+----------+-------+ Thrombus seen in cephalic vein at antecutibal fossa, possibly prior IV site.  Summary:  Right: No evidence of deep vein thrombosis in the upper extremity. Findings consistent with acute superficial vein thrombosis involving the right cephalic vein.  *See table(s) above for measurements and observations.    Preliminary     Medical Problem List and Plan: 1.  Deficits with mobility, transfers, cognition secondary to left brain infarcts.  -patient may shower  -ELOS/Goals: 27-32 days/Mod/Max A  Admit to CIR 2. Right antecubital DVT/ Antithrombotics: -DVT/anticoagulation:  Pharmaceutical: Lovenox--monitor  -antiplatelet therapy: DAPT.  3. Pain Management: N/A 4. Mood: LCSW to follow for evaluation and support as mentation improves.   -antipsychotic agents: N/A 5. Neuropsych: This patient is not capable of making decisions on his own behalf. 6. Skin/Wound Care: Air mattress overlay for pressure relief.  7. Fluids/Electrolytes/Nutrition: NPO. Continue to monitor fluid status with I/O.  8. Acute hypercarbic respiratory failure: Now on BIPAP at nights with oxygen per [place]--PCO2- 53.8. Encourage pulmonary toilet as able.    Supplemental oxygen dependent 9. Post stroke dysphagia with recurrent aspiration events: Strict NPO. Tube feeds with water flushes.   Advance diet as  tolerated 10. Afib  with RVR: NO BB due to soft BP. Monitor HR with increased mobility  Continue amiodarone 200 mg bid and taper by 200 mg weekly to off.  11. 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 Continue lasix daily with DAPT and statin--no ACE/ARB/BB due to soft BP.   Daily weights 12. Sleep wake disruption: Will order sleep chart--used to sleep almost till noon PTA.  May need to schedule melatonin--family does not want any sedatives. BIPAP at nights will hopefully help with better sleep.   18. ASCVD with B-CAS/chronic R-PCA/intracranial stenosis: On DAPT with statin. Avoid hypotension.  14. Abnormal LFTs: Question due to shocked liver. CMP ordered.  Bary Leriche, PA-C 07/21/2019  I have personally performed a face to face diagnostic evaluation, including, but not limited to relevant history and physical exam findings, of this patient and developed relevant assessment and plan.  Additionally, I have reviewed and concur with the physician assistant's documentation above.  Delice Lesch, MD, ABPMR  The patient's status has not changed. The original post admission physician evaluation remains appropriate, and any changes from the pre-admission screening or documentation from the acute chart are noted above.   Delice Lesch, MD, ABPMR

## 2019-07-21 NOTE — PMR Pre-admission (Signed)
PMR Admission Coordinator Pre-Admission Assessment  Patient: Levi Pruitt is an 74 y.o., male MRN: [id number] DOB: [birth year] Height:   Weight: 97.3 kg  Insurance Information HMO:     PPO:      PCP:      IPA:      80/20:      OTHER:  PRIMARY: Medicare A and B      Policy#: [id number]      Subscriber: patient CM Name:       Phone#:      Fax#:  Pre-Cert#: verified Civil engineer, contracting:  Benefits:  Phone #:      Name:  Eff. Date: 07/11/2010 (A and B)     Deduct: $1484      Out of Pocket Max: n/a      Life Max: n/a CIR: 100%      SNF: 20 full days Outpatient: 80%     Co-Pay: 20% Home Health: 100%      Co-Pay:  DME: 80%     Co-Pay: 20% Providers:  SECONDARY: BCBS Medicare Part D Supplement      Policy#:       Subscriber:  CM Name:       Phone#:      Fax#:  Pre-Cert#:       Employer:  Benefits:  Phone #:      Name:  Eff. Date:      Deduct:       Out of Pocket Max:       Life Max:  CIR:       SNF:  Outpatient:      Co-Pay:  Home Health:       Co-Pay:  DME:      Co-Pay:   Medicaid Application Date:       Case Manager:  Disability Application Date:       Case Worker:   The "Data Collection Information Summary" for patients in Inpatient Rehabilitation Facilities with attached "Privacy Act [place] Records" was provided and verbally reviewed with: Patient and Family  Emergency Contact Information Contact Information    Name Relation Home Work Mobile   Dorsett,[name] Significant other [phone]  [phone]   Lenoir,Alice Daughter [phone]  [phone]      Current Medical History  Patient Admitting Diagnosis: CVA  History of Present Illness: Pt is a 74 y/o male with no known significant PMH admitted to [facility] on [facility]/18/19 as a code stroke for slurred speech and flaccid RUE.  Per chart review, daughter spoke to pt in the evening on 1/7 and he appeared normal.  The following day neighbors noticed the papers were piling up on the front porch and called for a wellness  check.  EMS had to force their way in and pt was found in bed with above listed symptoms.  CT showed established large L PCA infarct with CTA confirming P1 occlusion.  Pt was not a TPA candidate as he was outside the window and not a candidate for thrombectomy as infarct was already established. EKG was concerning for ST elevated in inferior leads, cardiology felt patient not a candidate for emergent heart cath due to large CVA and recommended medical management of STEMI.  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  Neuro recommended DAPT x3 months, and then ASA alone.  ECHO revealed severe LV dysfunction with EF of 25-30% to be managed with low dose coreg and  ACE.  Hospital course complicated by acute onset pulmonary edema and aspiration PNA 2/2 difficulty managing secretions.  Also CKD stage II, monitoring renal function.  Pt with PEG placed 1/26 and NPO except ice chips after oral care.  Palliative had been following pt and family elected to pursue all offered and available medical interventions to prolong life.  Therapy evaluations were completed and pt presented with profound R hemiparesis and deficits with cognition, swallowing, and speech.  CIR was recommended for aggressive, multidisciplinary therapy to reduce burden of care for d/c to SNF or home with family and pt was admitted to [facility] on 1/27.  While on rehab he continued to have intermittent hypoxia with need for supplemental O2 and occasional IV lasix.  Cardiology was consulted for uptrending weight and tachypnea and recommended foley placement for strict I&O monitoring and IV lasix.  On 2/4 pt developed decreased LOC and acute respiratory distress requiring NRB mask.  [facility] hospitalist was consulted and recommended pt be transferred back to acute for management/closer monitoring.  Pt briefly required bipap while on acute.  Therapy evaluations were completed and pt was  again recommended for CIR.  Family discussions with palliative care and medical team ongoing; they wish to pursue CIR at this time.      Patient[facility] has been reviewed by the rehabilitation admission coordinator and physician.  Past Medical History  Past Medical History:  Diagnosis Date  . 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  Prior Rehab/Hospitalizations Has the patient had prior rehab or hospitalizations prior to admission? Yes  Has the patient had major surgery during 100 days prior to admission? Yes   Current Medications  Current Facility-Administered Medications:  .  acetaminophen (TYLENOL) tablet 650 mg, 650 mg, Oral, Q6H PRN, 650 mg at 07/20/19 2120 **OR** acetaminophen (TYLENOL) suppository 650 mg, 650 mg, Rectal, Q6H PRN, Rise Patience, MD .  amiodarone (PACERONE) tablet 200 mg, 200 mg, Oral, BID, Guilford Shi, MD, 200 mg at 07/21/19 0933 .  aspirin chewable tablet 324 mg, 324 mg, Per Tube, Daily, Rise Patience, MD, 324 mg at 07/21/19 0934 .  atorvastatin (LIPITOR) tablet 80 mg, 80 mg, Per Tube, q1800, Rise Patience, MD, 80 mg at 07/20/19 1647 .  chlorhexidine (PERIDEX) 0.12 % solution 15 mL, 15 mL, Mouth Rinse, BID, Lavina Hamman, MD, 15 mL at 07/21/19 0935 .  clopidogrel (PLAVIX) tablet 75 mg, 75 mg, Per Tube, Daily, Rise Patience, MD, 75 mg at 07/21/19 0933 .  enoxaparin (LOVENOX) injection 40 mg, 40 mg, Subcutaneous, Q24H, Rise Patience, MD, 40 mg at 07/20/19 1448 .  feeding supplement (JEVITY 1.5 CAL/FIBER) liquid 1,000 mL, 1,000 mL, Per Tube, Continuous, Kamineni, Neelima, MD, Last Rate: 40 mL/hr at 07/20/19 1821, 1,000 mL at 07/20/19 1821 .  feeding supplement (PRO-STAT SUGAR FREE 64) liquid 30 mL, 30 mL, Oral, BID, Lavina Hamman, MD, 30 mL at 07/21/19  0935 .  free water 200 mL, 200 mL, Per Tube, Q6H, Guilford Shi, MD, 200 mL at 07/21/19 0607 .  furosemide (LASIX) tablet 20 mg, 20 mg, Oral, Daily, Kamineni, Neelima, MD, 20 mg at 07/21/19 0933 .  influenza vaccine adjuvanted (FLUAD) injection 0.5 mL, 0.5 mL, Intramuscular, Tomorrow-1000, Kamineni, Neelima, MD .  insulin aspart (novoLOG) injection 0-9 Units, 0-9 Units, Subcutaneous, Q4H, Gean Birchwood  N, MD, 1 Units at 07/21/19 0934 .  ipratropium-albuterol (DUONEB) 0.5-2.5 (3) MG/3ML nebulizer solution 3 mL, 3 mL, Nebulization, Q6H PRN, Rise Patience, MD, 3 mL at 07/19/19 0022 .  labetalol (NORMODYNE) injection 10 mg, 10 mg, Intravenous, Once, Bodenheimer, Clenton Pare, NP, Stopped at 07/17/19 2225 .  MEDLINE mouth rinse, 15 mL, Mouth Rinse, q12n4p, Lavina Hamman, MD, 15 mL at 07/20/19 1647 .  Melatonin TABS 6 mg, 6 mg, Oral, QHS PRN, Rise Patience, MD, 6 mg at 07/20/19 2121 .  methylphenidate (RITALIN) tablet 5 mg, 5 mg, Per Tube, BID WC, Einar Grad, RPH, 5 mg at 07/21/19 2197 .  ondansetron (ZOFRAN) tablet 4 mg, 4 mg, Oral, Q6H PRN **OR** ondansetron (ZOFRAN) injection 4 mg, 4 mg, Intravenous, Q6H PRN, Rise Patience, MD  Patients Current Diet:  Diet Order            Diet NPO time specified  Diet effective now              Precautions / Restrictions Precautions Precautions: Fall Precaution Comments: right hemiparesis, right inattention, right visual field deficit Restrictions Weight Bearing Restrictions: No   Has the patient had 2 or more falls or a fall with injury in the past year? No  Prior Activity Level Community (5-7x/wk): indep PTA, driving, working as a Chief Executive Officer, no AD  Prior Functional Level Self Care: Did the patient need help bathing, dressing, using the toilet or eating? Independent  Indoor Mobility: Did the patient need assistance with walking from room to room (with or without device)? Independent  Stairs: Did the patient need  assistance with internal or external stairs (with or without device)? Independent  Functional Cognition: Did the patient need help planning regular tasks such as shopping or remembering to take medications? Independent  Home Assistive Devices / Equipment Home Assistive Devices/Equipment: Eyeglasses Home Equipment: None  Prior Device Use: Indicate devices/aids used by the patient prior to current illness, exacerbation or injury? None of the above  Current Functional Level Cognition  Overall Cognitive Status: Impaired/Different from baseline Difficult to assess due to: Impaired communication Current Attention Level: Sustained Orientation Level: Oriented to person, Disoriented to place, Disoriented to time, Disoriented to situation Following Commands: Follows one step commands consistently, Follows one step commands with increased time Safety/Judgement: Decreased awareness of safety, Decreased awareness of deficits General Comments: following one step commands. Mumbles brief sentences, a few words. Daughter present    Extremity Assessment (includes Sensation/Coordination)  Upper Extremity Assessment: Generalized weakness, RUE deficits/detail RUE Deficits / Details: flaccid RUE Sensation: decreased proprioception, decreased light touch RUE Coordination: decreased gross motor, decreased fine motor LUE Sensation: WNL LUE Coordination: WNL  Lower Extremity Assessment: Generalized weakness, RLE deficits/detail, LLE deficits/detail RLE Deficits / Details: no active movement noted, hemiparesis RLE Sensation: decreased light touch RLE Coordination: decreased fine motor, decreased gross motor LLE Deficits / Details: grossly 3-/5 throughout LLE Sensation: WNL    ADLs  Overall ADL's : Needs assistance/impaired Grooming: Wash/dry hands, Wash/dry face, Bed level, Moderate assistance Upper Body Bathing: Maximal assistance, Bed level Upper Body Bathing Details (indicate cue type and reason):  simulated, verbalm and physical cues required Lower Body Bathing: Total assistance Upper Body Dressing : Total assistance Lower Body Dressing: Total assistance Toileting- Clothing Manipulation and Hygiene: Total assistance, +2 for safety/equipment, +2 for physical assistance, Bed level Toileting - Clothing Manipulation Details (indicate cue type and reason): total assist for bed level hygiene  General ADL Comments: requires max-total assist for all  self care    Mobility  Overal bed mobility: Needs Assistance Bed Mobility: Rolling, Sidelying to Sit, Sit to Supine, Sit to Sidelying Rolling: Max assist, +2 for physical assistance Sidelying to sit: Max assist, +2 for safety/equipment Supine to sit: Max assist, +2 for physical assistance Sit to supine: Total assist, +2 for physical assistance General bed mobility comments: pt too fatigued. Per PT note, pt total - max A +2 for bed mobility    Transfers  Overall transfer level: (unable to perform today - safety/medical) General transfer comment: unable to attempt    Ambulation / Gait / Stairs / Wheelchair Mobility  Ambulation/Gait General Gait Details: unable    Posture / Balance Dynamic Sitting Balance Sitting balance - Comments: Poor sitting balance per PT note, Posterior and R lean Balance Overall balance assessment: Needs assistance Sitting-balance support: Feet supported, Single extremity supported, No upper extremity supported Sitting balance-Leahy Scale: Poor Sitting balance - Comments: Poor sitting balance per PT note, Posterior and R lean Postural control: Posterior lean, Right lateral lean    Special needs/care consideration BiPAP/CPAP no CPM no Continuous Drip IV no Dialysis no        Days n/a Life Vest no Oxygen 2-3L in hospital Special Bed may benefit from an air matress Trach Size no Wound Vac (area) no      Location n/a Skin                               Bowel mgmt: incontinent Bladder mgmt: incontinent Diabetic  mgmt: no Behavioral consideration no Chemo/radiation no   Previous Home Environment (from acute therapy documentation) Living Arrangements: Alone  Lives With: Alone Available Help at Discharge: Family, Friend(s), Available PRN/intermittently Type of Home: House Home Layout: One level Home Access: Stairs to enter Entrance Stairs-Rails: Right, Left Entrance Stairs-Number of Steps: couple  Bathroom Shower/Tub: Chiropodist: Standard Home Care Services: No Additional Comments: daughter lives in [place], long time girlfriend works but available PRN, unsure of home setup as pt reported having a multilevel house  Discharge Living Setting Plans for Discharge Living Setting: Patient's home, Other (Comment)(most likely will d/c to SNF for further rehab) Type of Home at Discharge: [place] Name at Discharge: tbd Discharge Home Layout: One level Discharge Home Access: Stairs to enter Entrance Stairs-Rails: None(front has BHR w/ grass to access steps, carport has no HR) Entrance Stairs-Number of Steps: 2 from carport, 6 from front Discharge Bathroom Shower/Tub: Tub/shower unit Discharge Bathroom Toilet: Standard Discharge Bathroom Accessibility: Yes How Accessible: Accessible via walker Does the patient have any problems obtaining your medications?: No  Social/Family/Support Systems Patient Roles: Partner Anticipated Caregiver: [name] (sig other), Amy (dtr, lives out of state, works remotely) Anticipated Ambulance person Information: [name] [phone] Chauncey Reading), [name] [phone] Ability/Limitations of Caregiver: [name] works; daughter [name] lives in [place], other family and friends will assist Caregiver Availability: Other (Comment)(see additional info below) Discharge Plan Discussed with Primary Caregiver: Yes Is Caregiver In Agreement with Plan?: Yes Does Caregiver/Family have Issues with Lodging/Transportation while Pt is in Rehab?:  No  Goals/Additional Needs Patient/Family Goal for Rehab: PT/OT mod assist, SLP mod asisst Expected length of stay: 4 weeks Dietary Needs: NPO except chips, PEG tube Additional Information: Pt will most likely d/c to SNF for continued rehab following CIR; however, if he makes good progress family could potentially provide 24/7 assist short term.   Pt/Family Agrees to Admission and willing to participate:  Yes Program Orientation Provided & Reviewed with Pt/Caregiver Including Roles  & Responsibilities: Yes  Barriers to Discharge: Decreased caregiver support, Home environment access/layout  Decrease burden of Care through IP rehab admission: Specialzed equipment needs, Diet advancement, Decrease number of caregivers, Bowel and bladder program and Patient/family education  Possible need for SNF placement upon discharge:  Probably.  Pt and family are aware that he will likely still need significant assist and further rehab at completion of CIR program.  If family unable to provide, will need SNF placement.   Patient Condition: I have reviewed medical records from [facility], spoken with CM, and patient and daughter. I met with patient at the bedside for inpatient rehabilitation assessment.  Patient will benefit from ongoing PT, OT and SLP, can actively participate in 3 hours of therapy a day 5 days of the week, and can make measurable gains during the admission.  Patient will also benefit from the coordinated team approach during an Inpatient Acute Rehabilitation admission.  The patient will receive intensive therapy as well as Rehabilitation physician, nursing, social worker, and care management interventions.  Due to bladder management, bowel management, safety, skin/wound care, disease management, medication administration, pain management and patient education the patient requires 24 hour a day rehabilitation nursing.  The patient is currently max to total +2 with mobility and basic ADLs.   Discharge setting and therapy post discharge at skilled nursing facility is anticipated.  Patient has agreed to participate in the Acute Inpatient Rehabilitation Program and will admit today.  Preadmission Screen Completed By:  Bonnee Quin, DPT 07/21/2019 11:20 AM ______________________________________________________________________   Discussed status with Dr. Posey Pronto on 07/21/19  at 11:30 AM  and received approval for admission today.  Admission Coordinator:  Michel Santee, PT, DPT time 11:30 AM Sudie Grumbling 07/21/19    Assessment/Plan: Diagnosis: Left brain infarcts with subsequent respiratory distress 1. Does the need for close, 24 hr/day Medical supervision in concert with the patient's rehab needs make it unreasonable for this patient to be served in a less intensive setting? Yes 2. Co-Morbidities requiring supervision/potential complications: CHF with EF of 25-30%, acute onset pulmonary edema and aspiration PNA 2/2, CKD stage II, dysphagia s/p PEG 3. Due to bladder management, bowel management, safety, skin/wound care, disease management, medication administration, pain management and patient education, does the patient require 24 hr/day rehab nursing? Yes 4. Does the patient require coordinated care of a physician, rehab nurse, PT, OT, and SLP to address physical and functional deficits in the context of the above medical diagnosis(es)? Yes Addressing deficits in the following areas: balance, endurance, locomotion, strength, transferring, bowel/bladder control, bathing, dressing, feeding, grooming, toileting, cognition, speech, language, swallowing and psychosocial support 5. Can the patient actively participate in an intensive therapy program of at least 3 hrs of therapy 5 days a week? Potentially 6. The potential for patient to make measurable gains while on inpatient rehab is good and fair 7. Anticipated functional outcomes upon discharge from inpatient rehab: mod assist and max assist  PT, mod assist and max assist OT, mod assist and max assist SLP 8. Estimated rehab length of stay to reach the above functional goals is: 27-30 days. 9. Anticipated discharge destination: Other 10. Overall Rehab/Functional Prognosis: poor   MD Signature: Delice Lesch, MD, ABPMR

## 2019-07-21 NOTE — TOC Transition Note (Signed)
Transition of Care [facility]) - CM/SW Discharge Note Marvetta Gibbons RN, BSN Transitions of Care Unit 4E- RN Case Manager [phone]   Patient Details  Name: Levi Pruitt MRN: [id number] Date of Birth: Jun 17, 1945  Transition of Care [facility]) CM/SW Contact:  Dawayne Patricia, RN Phone Number: 07/21/2019, 1:41 PM   Clinical Narrative:    Pt admitted from CIR with Asp. PNA- pt stable for transition back to CIR- notified by Urban Gibson that [facility] rehab has bed available and ready to re-admit pt today- pt and daughter agreeable- plan to transition back to CIR later today.    Final next level of care: IP Rehab Facility Barriers to Discharge: No Barriers Identified   Patient Goals and CMS Choice Patient states their goals for this hospitalization and ongoing recovery are:: return to [facility] re[facility] CMS Medicare.gov Compare Post Acute Care list provided to:: Patient Choice offered to / list presented to : Patient  Discharge Placement               [facility]hab        Discharge Plan and Services   Discharge Planning Services: CM Consult            DME Arranged: N/A DME Agency: NA       HH Arranged: NA HH Agency: NA        Social Determinants of Health (SDOH) Interventions     Readmission Risk Interventions No flowsheet data found.

## 2019-07-21 NOTE — Progress Notes (Signed)
Patient arrived via bed to unit accompanied by daughter and staff from previous unit. Safety plan discussed with patient and daughter. Bed alarm for safety.

## 2019-07-21 NOTE — Progress Notes (Signed)
Right upper extremity venous duplex exam performed.  Messaged results to Dr. Starla Link.  Preliminary results can be found under CV proc under chart review.  07/21/2019 11:10 AM  Fed Ceci, K., RDMS, RVT

## 2019-07-21 NOTE — Progress Notes (Signed)
PMR Admission Coordinator Pre-Admission Assessment   Patient: Levi Pruitt is an 74 y.o., male MRN: [id number] DOB: August 25, 1945 Height:   Weight: 97.3 kg   Insurance Information HMO:     PPO:      PCP:      IPA:      80/20:      OTHER:  PRIMARY: Medicare A and B      Policy#: [id number]      Subscriber: patient CM Name:       Phone#:      Fax#:  Pre-Cert#: verified Civil engineer, contracting:  Benefits:  Phone #:      Name:  Eff. Date: 07/11/2010 (A and B)     Deduct: $1484      Out of Pocket Max: n/a      Life Max: n/a CIR: 100%      SNF: 20 full days Outpatient: 80%     Co-Pay: 20% Home Health: 100%      Co-Pay:  DME: 80%     Co-Pay: 20% Providers:  SECONDARY: BCBS Medicare Part D Supplement      Policy#:       Subscriber:  CM Name:       Phone#:      Fax#:  Pre-Cert#:       Employer:  Benefits:  Phone #:      Name:  Eff. Date:      Deduct:       Out of Pocket Max:       Life Max:  CIR:       SNF:  Outpatient:      Co-Pay:  Home Health:       Co-Pay:  DME:      Co-Pay:    Medicaid Application Date:       Case Manager:  Disability Application Date:       Case Worker:    The "Data Collection Information Summary" for patients in Inpatient Rehabilitation Facilities with attached "Privacy Act [place] Records" was provided and verbally reviewed with: Patient and Family   Emergency Contact Information         Contact Information     Name Relation Home Work Mobile    Dorsett,[name] Significant other [phone]   [phone]    Farler,Alice Daughter [phone]   [phone]         Current Medical History  Patient Admitting Diagnosis: CVA   History of Present Illness: Pt is a 74 y/o male with no known significant PMH admitted to [facility] on 06/18/19 as a code stroke for slurred speech and flaccid RUE.  Per chart review, daughter spoke to pt in the evening on 1/7 and he appeared normal.  The following day neighbors noticed the papers were piling up on the front porch and  called for a wellness check.  EMS had to force their way in and pt was found in bed with above listed symptoms.  CT showed established large L PCA infarct with CTA confirming P1 occlusion.  Pt was not a TPA candidate as he was outside the window and not a candidate for thrombectomy as infarct was already established. EKG was concerning for ST elevated in inferior leads, cardiology felt patient not a candidate for emergent heart cath due to large CVA and recommended medical management of STEMI.  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  Neuro recommended DAPT x3 months,  and then ASA alone.  ECHO revealed severe LV dysfunction with EF of 25-30% to be managed with low dose coreg and ACE.  Hospital course complicated by acute onset pulmonary edema and aspiration PNA 2/2 difficulty managing secretions.  Also CKD stage II, monitoring renal function.  Pt with PEG placed 1/26 and NPO except ice chips after oral care.  Palliative had been following pt and family elected to pursue all offered and available medical interventions to prolong life.  Therapy evaluations were completed and pt presented with profound R hemiparesis and deficits with cognition, swallowing, and speech.  CIR was recommended for aggressive, multidisciplinary therapy to reduce burden of care for d/c to SNF or home with family and pt was admitted to [facility] on 1/27.  While on rehab he continued to have intermittent hypoxia with need for supplemental O2 and occasional IV lasix.  Cardiology was consulted for uptrending weight and tachypnea and recommended foley placement for strict I&O monitoring and IV lasix.  On 2/4 pt developed decreased LOC and acute respiratory distress requiring NRB mask.  [facility] hospitalist was consulted and recommended pt be transferred back to acute for management/closer monitoring.  Pt briefly required bipap while on acute.  Therapy evaluations were  completed and pt was again recommended for CIR.  Family discussions with palliative care and medical team ongoing; they wish to pursue CIR at this time.     Patient's medical record from [facility] has been reviewed by the rehabilitation admission coordinator and physician.   Past Medical History      Past Medical History:  Diagnosis Date  . 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   Prior Rehab/Hospitalizations Has the patient had prior rehab or hospitalizations prior to admission? Yes   Has the patient had major surgery during 100 days prior to admission? Yes              Current Medications   Current Facility-Administered Medications:  .  acetaminophen (TYLENOL) tablet 650 mg, 650 mg, Oral, Q6H PRN, 650 mg at 07/20/19 2120 **OR** acetaminophen (TYLENOL) suppository 650 mg, 650 mg, Rectal, Q6H PRN, Rise Patience, MD .  amiodarone (PACERONE) tablet 200 mg, 200 mg, Oral, BID, Guilford Shi, MD, 200 mg at 07/21/19 0933 .  aspirin chewable tablet 324 mg, 324 mg, Per Tube, Daily, Rise Patience, MD, 324 mg at 07/21/19 0934 .  atorvastatin (LIPITOR) tablet 80 mg, 80 mg, Per Tube, q1800, Rise Patience, MD, 80 mg at 07/20/19 1647 .  chlorhexidine (PERIDEX) 0.12 % solution 15 mL, 15 mL, Mouth Rinse, BID, Lavina Hamman, MD, 15 mL at 07/21/19 0935 .  clopidogrel (PLAVIX) tablet 75 mg, 75 mg, Per Tube, Daily, Rise Patience, MD, 75 mg at 07/21/19 0933 .  enoxaparin (LOVENOX) injection 40 mg, 40 mg, Subcutaneous, Q24H, Rise Patience, MD, 40 mg at 07/20/19 1448 .  feeding supplement (JEVITY 1.5 CAL/FIBER) liquid 1,000 mL, 1,000 mL, Per Tube, Continuous, Kamineni, Neelima, MD, Last Rate: 40 mL/hr at 07/20/19 1821, 1,000 mL at 07/20/19 1821 .  feeding supplement (PRO-STAT SUGAR FREE 64) liquid 30 mL, 30 mL, Oral, BID,  Lavina Hamman, MD, 30 mL at 07/21/19 0935 .  free water 200 mL, 200 mL, Per Tube, Q6H, Guilford Shi, MD, 200 mL at 07/21/19 0607 .  furosemide (LASIX) tablet 20 mg,  20 mg, Oral, Daily, Guilford Shi, MD, 20 mg at 07/21/19 0933 .  influenza vaccine adjuvanted (FLUAD) injection 0.5 mL, 0.5 mL, Intramuscular, Tomorrow-1000, Kamineni, Neelima, MD .  insulin aspart (novoLOG) injection 0-9 Units, 0-9 Units, Subcutaneous, Q4H, Rise Patience, MD, 1 Units at 07/21/19 [id number] .  ipratropium-albuterol (DUONEB) 0.5-2.5 (3) MG/3ML nebulizer solution 3 mL, 3 mL, Nebulization, Q6H PRN, Rise Patience, MD, 3 mL at 07/19/19 0022 .  labetalol (NORMODYNE) injection 10 mg, 10 mg, Intravenous, Once, Bodenheimer, Clenton Pare, NP, Stopped at 07/17/19 2225 .  MEDLINE mouth rinse, 15 mL, Mouth Rinse, q12n4p, Lavina Hamman, MD, 15 mL at 07/20/19 1647 .  Melatonin TABS 6 mg, 6 mg, Oral, QHS PRN, Rise Patience, MD, 6 mg at 07/20/19 2121 .  methylphenidate (RITALIN) tablet 5 mg, 5 mg, Per Tube, BID WC, Einar Grad, RPH, 5 mg at 07/21/19 7290 .  ondansetron (ZOFRAN) tablet 4 mg, 4 mg, Oral, Q6H PRN **OR** ondansetron (ZOFRAN) injection 4 mg, 4 mg, Intravenous, Q6H PRN, Rise Patience, MD   Patients Current Diet:     Diet Order                      Diet NPO time specified  Diet effective now                   Precautions / Restrictions Precautions Precautions: Fall Precaution Comments: right hemiparesis, right inattention, right visual field deficit Restrictions Weight Bearing Restrictions: No    Has the patient had 2 or more falls or a fall with injury in the past year? No   Prior Activity Level Community (5-7x/wk): indep PTA, driving, working as a Chief Executive Officer, no AD   Prior Functional Level Self Care: Did the patient need help bathing, dressing, using the toilet or eating? Independent   Indoor Mobility: Did the patient need assistance with walking from room to room (with  or without device)? Independent   Stairs: Did the patient need assistance with internal or external stairs (with or without device)? Independent   Functional Cognition: Did the patient need help planning regular tasks such as shopping or remembering to take medications? Independent   Home Assistive Devices / Equipment Home Assistive Devices/Equipment: Eyeglasses Home Equipment: None   Prior Device Use: Indicate devices/aids used by the patient prior to current illness, exacerbation or injury? None of the above   Current Functional Level Cognition   Overall Cognitive Status: Impaired/Different from baseline Difficult to assess due to: Impaired communication Current Attention Level: Sustained Orientation Level: Oriented to person, Disoriented to place, Disoriented to time, Disoriented to situation Following Commands: Follows one step commands consistently, Follows one step commands with increased time Safety/Judgement: Decreased awareness of safety, Decreased awareness of deficits General Comments: following one step commands. Mumbles brief sentences, a few words. Daughter present    Extremity Assessment (includes Sensation/Coordination)   Upper Extremity Assessment: Generalized weakness, RUE deficits/detail RUE Deficits / Details: flaccid RUE Sensation: decreased proprioception, decreased light touch RUE Coordination: decreased gross motor, decreased fine motor LUE Sensation: WNL LUE Coordination: WNL  Lower Extremity Assessment: Generalized weakness, RLE deficits/detail, LLE deficits/detail RLE Deficits / Details: no active movement noted, hemiparesis RLE Sensation: decreased light touch RLE Coordination: decreased fine motor, decreased gross motor LLE Deficits / Details: grossly 3-/5 throughout LLE Sensation: WNL     ADLs   Overall ADL's : Needs assistance/impaired Grooming: Wash/dry hands, Wash/dry face, Bed level, Moderate assistance Upper Body Bathing: Maximal assistance,  Bed level Upper Body Bathing Details (indicate cue type and reason): simulated, verbalm and physical cues required Lower Body Bathing: Total assistance Upper Body Dressing : Total assistance Lower Body Dressing: Total assistance Toileting- Clothing Manipulation and Hygiene: Total assistance, +2 for safety/equipment, +2 for physical assistance, Bed level Toileting - Clothing Manipulation Details (indicate cue type and reason): total assist for bed level hygiene  General ADL Comments: requires max-total assist for all self care     Mobility   Overal bed mobility: Needs Assistance Bed Mobility: Rolling, Sidelying to Sit, Sit to Supine, Sit to Sidelying Rolling: Max assist, +2 for physical assistance Sidelying to sit: Max assist, +2 for safety/equipment Supine to sit: Max assist, +2 for physical assistance Sit to supine: Total assist, +2 for physical assistance General bed mobility comments: pt too fatigued. Per PT note, pt total - max A +2 for bed mobility     Transfers   Overall transfer level: (unable to perform today - safety/medical) General transfer comment: unable to attempt     Ambulation / Gait / Stairs / Wheelchair Mobility   Ambulation/Gait General Gait Details: unable     Posture / Balance Dynamic Sitting Balance Sitting balance - Comments: Poor sitting balance per PT note, Posterior and R lean Balance Overall balance assessment: Needs assistance Sitting-balance support: Feet supported, Single extremity supported, No upper extremity supported Sitting balance-Leahy Scale: Poor Sitting balance - Comments: Poor sitting balance per PT note, Posterior and R lean Postural control: Posterior lean, Right lateral lean     Special needs/care consideration BiPAP/CPAP no CPM no Continuous Drip IV no Dialysis no        Days n/a Life Vest no Oxygen 2-3L in hospital Special Bed may benefit from an air matress Trach Size no Wound Vac (area) no      Location n/a Skin                                Bowel mgmt: incontinent Bladder mgmt: incontinent Diabetic mgmt: no Behavioral consideration no Chemo/radiation no    Previous Home Environment (from acute therapy documentation) Living Arrangements: Alone  Lives With: Alone Available Help at Discharge: Family, Friend(s), Available PRN/intermittently Type of Home: House Home Layout: One level Home Access: Stairs to enter Entrance Stairs-Rails: Right, Left Entrance Stairs-Number of Steps: couple  Bathroom Shower/Tub: Chiropodist: Standard Home Care Services: No Additional Comments: daughter lives in [place], long time girlfriend works but available PRN, unsure of home setup as pt reported having a multilevel house   Discharge Living Setting Plans for Discharge Living Setting: Patient's home, Other (Comment)(most likely will d/c to SNF for further rehab) Type of Home at Discharge: [place] Name at Discharge: tbd Discharge Home Layout: One level Discharge Home Access: Stairs to enter Entrance Stairs-Rails: None(front has BHR w/ grass to access steps, carport has no HR) Entrance Stairs-Number of Steps: 2 from carport, 6 from front Discharge Bathroom Shower/Tub: Tub/shower unit Discharge Bathroom Toilet: Standard Discharge Bathroom Accessibility: Yes How Accessible: Accessible via walker Does the patient have any problems obtaining your medications?: No   Social/Family/Support Systems Patient Roles: Partner Anticipated Caregiver: [name] (sig other), Amy (dtr, lives out of state, works remotely) Anticipated Ambulance person Information: [name] [phone] Chauncey Reading), [name] [phone] Ability/Limitations of Caregiver: [name] works; daughter [name] lives in [place], other family and friends will assist Caregiver Availability: Other (Comment)(see additional info below) Discharge Plan Discussed with Primary Caregiver:  Yes Is Caregiver In Agreement with Plan?: Yes Does  Caregiver/Family have Issues with Lodging/Transportation while Pt is in Rehab?: No   Goals/Additional Needs Patient/Family Goal for Rehab: PT/OT mod assist, SLP mod asisst Expected length of stay: 4 weeks Dietary Needs: NPO except chips, PEG tube Additional Information: Pt will most likely d/c to SNF for continued rehab following CIR; however, if he makes good progress family could potentially provide 24/7 assist short term.   Pt/Family Agrees to Admission and willing to participate: Yes Program Orientation Provided & Reviewed with Pt/Caregiver Including Roles  & Responsibilities: Yes  Barriers to Discharge: Decreased caregiver support, Home environment access/layout   Decrease burden of Care through IP rehab admission: Specialzed equipment needs, Diet advancement, Decrease number of caregivers, Bowel and bladder program and Patient/family education   Possible need for SNF placement upon discharge:  Probably.  Pt and family are aware that he will likely still need significant assist and further rehab at completion of CIR program.  If family unable to provide, will need SNF placement.    Patient Condition: I have reviewed medical records from [facility], spoken with CM, and patient and daughter. I met with patient at the bedside for inpatient rehabilitation assessment.  Patient will benefit from ongoing PT, OT and SLP, can actively participate in 3 hours of therapy a day 5 days of the week, and can make measurable gains during the admission.  Patient will also benefit from the coordinated team approach during an Inpatient Acute Rehabilitation admission.  The patient will receive intensive therapy as well as Rehabilitation physician, nursing, social worker, and care management interventions.  Due to bladder management, bowel management, safety, skin/wound care, disease management, medication administration, pain management and patient education the patient requires 24 hour a day rehabilitation  nursing.  The patient is currently max to total +2 with mobility and basic ADLs.  Discharge setting and therapy post discharge at skilled nursing facility is anticipated.  Patient has agreed to participate in the Acute Inpatient Rehabilitation Program and will admit today.   Preadmission Screen Completed By:  Bonnee Quin, DPT 07/21/2019 11:20 AM ______________________________________________________________________   Discussed status with Dr. Posey Pronto on 07/21/19  at 11:30 AM  and received approval for admission today.   Admission Coordinator:  Michel Santee, PT, DPT time 11:30 AM Sudie Grumbling 07/21/19     Assessment/Plan: Diagnosis: Left brain infarcts with subsequent respiratory distress 1. Does the need for close, 24 hr/day Medical supervision in concert with the patient's rehab needs make it unreasonable for this patient to be served in a less intensive setting? Yes 2. Co-Morbidities requiring supervision/potential complications: CHF with EF of 25-30%, acute onset pulmonary edema and aspiration PNA 2/2, CKD stage II, dysphagia s/p PEG 3. Due to bladder management, bowel management, safety, skin/wound care, disease management, medication administration, pain management and patient education, does the patient require 24 hr/day rehab nursing? Yes 4. Does the patient require coordinated care of a physician, rehab nurse, PT, OT, and SLP to address physical and functional deficits in the context of the above medical diagnosis(es)? Yes Addressing deficits in the following areas: balance, endurance, locomotion, strength, transferring, bowel/bladder control, bathing, dressing, feeding, grooming, toileting, cognition, speech, language, swallowing and psychosocial support 5. Can the patient actively participate in an intensive therapy program of at least 3 hrs of therapy 5 days a week? Potentially 6. The potential for patient to make measurable gains while on inpatient rehab is good and fair 7. Anticipated  functional outcomes upon  discharge from inpatient rehab: mod assist and max assist PT, mod assist and max assist OT, mod assist and max assist SLP 8. Estimated rehab length of stay to reach the above functional goals is: 27-30 days. 9. Anticipated discharge destination: Other 10. Overall Rehab/Functional Prognosis: poor     MD Signature: Delice Lesch, MD, ABPMR

## 2019-07-21 NOTE — Progress Notes (Signed)
Inpatient Rehab Admissions:  Inpatient Rehab Consult received.  I met with patient and his daughter at the bedside for rehabilitation assessment and to discuss goals and expectations of an inpatient rehab admission.  They recognize that pt has a poor prognosis, but are hopeful that some recovery will be possible with CIR.  Dr. Starla Link approved pt to return to CIR today.  I will let pt/family and CM know.   Signed: Shann Medal, PT, DPT Admissions Coordinator [phone] 07/21/19  11:08 AM

## 2019-07-21 NOTE — H&P (Signed)
Physical Medicine and Rehabilitation Admission H&P    CC: Stroke with functional deficits.    HPI: Levi Pruitt is a 74 year old male without medical care for 40+years but in relatively good health who was admitted on 06/18/2019.  History taken from chart review due to aphasia. He was found down with right facial droop, right hemiparesis and dysarthria. Work up revealed acute large right PCA infarct, small left parietal infarct. Echo showed cardiomyopathy with EF of 25-30%. He was found to have NSTEMI. Hospital course significant for recurrent episodes of aspiration PNA with difficulty handling secretions as well as bouts of lethargy. PEG placed for nutritional support on 06/22/2019 by radiology and cardiology recommended medical management as patient not candidate for intervention to work up inferior wall NSTEMI.  Family elected on full scope of care and CIR recommended to reduce burden of care. Patient with resultant dense right hemiplegia, right inattention, oral apraxia, cognitive deficits with ongoing bouts of lethargy.   He was admitted to CIR on 07/07/2019 for intensive rehab program. He was found to have sleep wake disruption affecting, SOB felt to be due to fluid overload as well as mentation. He later developed A. Fib with RVR with hypotension on 07/15/2019.  He was transferred to acute hospital for management. He was started on IV amiodarone for rate control and IV antibiotics added due to sepsis due to recurrent aspiration PNA.  Acute on chronic combined CHF treated with IV diuresis and he required BIPAP due to acute respiratory failure. He has completed 5 day course IV antibiotics and weaned to 4L oxygen during the day with BIPAP at nights. Heart rate controlled on amiodarone bid X 1 week--.taper to 200 mg daily X 1 week then d/c. He continued to have waxing and waning of MS and follow up CT head 2/8 showed large subacute L-PCA infarct with petechial hemorrhage and/or cortical lamina necrosis  within portions of paramedian left parietoccipital infarct and small subacute cortical infarct in left frontal lobe.  RUE doppler done due to edema and revealed acute thrombus in cephalic vein at antecubital site likely due to DVT--to monitor. To continue DAPT. Cardiology recommended palliative consult for [place] and family elected on "NO intubation". Please see preadmission assessment from earlier today.   Review of Systems  Unable to perform ROS: Mental acuity     Past Medical History:  Diagnosis Date  . 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 IR GASTROSTOMY TUBE MOD SED  07/06/2019     Family History  Problem Relation Age of Onset  . Heart disease Father   . High blood pressure Sister        is his twin    Social History: Lives alone and independent PTA.  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 Has a girlfriend who lives in [place] and they see each other once a week. He does not use tobacco, ETOH or drugs.    Allergies: No Known Allergies    Medications Prior to Admission  Medication Sig Dispense Refill  . aspirin 81 MG chewable tablet Place 4 tablets (324 mg total) into feeding tube daily.    [name] atorvastatin (LIPITOR) 80 MG tablet Place 1 tablet (80 mg total) into feeding tube daily at 6 PM.    . carvedilol (COREG) 3.125 MG tablet Place 1 tablet (3.125 mg total) into feeding tube 2 (two) times daily  with a meal.    . clopidogrel (PLAVIX) 75 MG tablet Place 1 tablet (75 mg total) into feeding tube daily.    [name] enoxaparin (LOVENOX) 40 MG/0.4ML injection Inject 0.4 mLs (40 mg total) into the skin daily. 0 mL   . insulin aspart (NOVOLOG) 100 UNIT/ML injection Inject 0-9 Units into the skin every 4 (four) hours. 10 mL 11  . ipratropium-albuterol (DUONEB) 0.5-2.5 (3) MG/3ML SOLN Take 3 mLs by nebulization every 6 (six) hours as needed. 360 mL   . Melatonin 3 MG TABS Take 2  tablets (6 mg total) by mouth at bedtime as needed (insomnia).  0  . methylphenidate (RITALIN) 5 MG tablet Take 1 tablet (5 mg total) by mouth 2 (two) times daily with breakfast and lunch.  0  . Nutritional Supplements (FEEDING SUPPLEMENT, JEVITY 1.5 CAL/FIBER,) LIQD Take 1,000 mLs by mouth continuous.    . [DISCONTINUED] furosemide (LASIX) 40 MG tablet Place 1 tablet (40 mg total) into feeding tube daily. 30 tablet   . [DISCONTINUED] Water For Irrigation, Sterile (FREE WATER) SOLN Place 150 mLs into feeding tube every 6 (six) hours.      Drug Regimen Review  Drug regimen was reviewed and remains appropriate with no significant issues identified  Home: Home Living Family/patient expects to be discharged to:: Unsure Living Arrangements: Alone Available Help at Discharge: Family, Friend(s), Available PRN/intermittently Type of Home: House Home Access: Stairs to enter Technical brewer of Steps: couple  Entrance Stairs-Rails: Right, Left Home Layout: One level Bathroom Shower/Tub: Chiropodist: Standard Home Equipment: None Additional Comments: daughter lives in [place], long time girlfriend works but available PRN, unsure of home setup as pt reported having a multilevel house  Lives With: Alone   Functional History: Prior Function Level of Independence: Independent Comments: prior to hospital admission- independent, driving, working Biochemist, clinical) - all of the above information retrieved from chart review due to pt's cognitive impairments  Functional Status:  Mobility: Bed Mobility Overal bed mobility: Needs Assistance Bed Mobility: Rolling, Sidelying to Sit, Sit to Supine, Sit to Sidelying Rolling: Max assist, +2 for physical assistance Sidelying to sit: Max assist, +2 for safety/equipment Supine to sit: Max assist, +2 for physical assistance Sit to supine: Total assist, +2 for physical assistance General bed mobility comments: pt too fatigued. Per PT note, pt  total - max A +2 for bed mobility Transfers Overall transfer level: (unable to perform today - safety/medical) General transfer comment: unable to attempt Ambulation/Gait General Gait Details: unable    ADL: ADL Overall ADL's : Needs assistance/impaired Grooming: Wash/dry hands, Wash/dry face, Bed level, Moderate assistance Upper Body Bathing: Maximal assistance, Bed level Upper Body Bathing Details (indicate cue type and reason): simulated, verbalm and physical cues required Lower Body Bathing: Total assistance Upper Body Dressing : Total assistance Lower Body Dressing: Total assistance Toileting- Clothing Manipulation and Hygiene: Total assistance, +2 for safety/equipment, +2 for physical assistance, Bed level Toileting - Clothing Manipulation Details (indicate cue type and reason): total assist for bed level hygiene  General ADL Comments: requires max-total assist for all self care  Cognition: Cognition Overall Cognitive Status: Impaired/Different from baseline Orientation Level: Oriented to person, Disoriented to place, Disoriented to time, Disoriented to situation Cognition Arousal/Alertness: Awake/alert Behavior During Therapy: Flat affect Overall Cognitive Status: Impaired/Different from baseline Area of Impairment: Attention, Safety/judgement, Awareness, Problem solving Orientation Level: Time, Situation, Place Current Attention Level: Sustained Memory: Decreased short-term memory Following Commands: Follows one step commands consistently, Follows one step commands with increased  time Safety/Judgement: Decreased awareness of safety, Decreased awareness of deficits Awareness: Intellectual Problem Solving: Slow processing, Requires verbal cues General Comments: following one step commands. Mumbles brief sentences, a few words. Daughter present Difficult to assess due to: Impaired communication  Physical Exam: Blood pressure (!) 104/58, pulse 60, temperature 98 F (36.7  C), temperature source Oral, resp. rate 20, weight 97.3 kg, SpO2 98 %. Physical Exam  Nursing note and vitals reviewed. Constitutional: He appears well-developed.  Obese  HENT:  Head: Normocephalic and atraumatic.  Eyes: Right eye exhibits no discharge. Left eye exhibits no discharge. No scleral icterus.  Neck: No tracheal deviation present. No thyromegaly present.  Respiratory: Effort normal.  +[place] +[place]  GI: Soft. He exhibits no distension.  PEG with dry dressing.   Musculoskeletal:        General: Edema (RUE with resolving ecchymosis. ) present. No tenderness.  Neurological: He is alert.  Right facial weakness Dysarthria  Not answering orientation questions Motor: Limited due to participation Dense right hemiplegia.  Increased tone noted in RLE. Right inattention  Unable to move right eye beyond midline.   Freely moving LUE, >/ 3/5 LLE: no movement noted Global aphasia  Skin: Skin is warm and dry.  Psychiatric:  Unable to assess due to mentation    Results for orders placed or performed during the hospital encounter of 07/15/19 (from the past 48 hour(s))  Glucose, capillary     Status: None   Collection Time: 07/19/19  5:14 PM  Result Value Ref Range   Glucose-Capillary 91 70 - 99 mg/dL  Glucose, capillary     Status: None   Collection Time: 07/19/19  7:56 PM  Result Value Ref Range   Glucose-Capillary 95 70 - 99 mg/dL  Glucose, capillary     Status: None   Collection Time: 07/19/19 11:28 PM  Result Value Ref Range   Glucose-Capillary 84 70 - 99 mg/dL  CBC     Status: Abnormal   Collection Time: 07/20/19  2:56 AM  Result Value Ref Range   WBC 13.3 (H) 4.0 - 10.5 K/uL   RBC 4.19 (L) 4.22 - 5.81 MIL/uL   Hemoglobin 12.9 (L) 13.0 - 17.0 g/dL   HCT 42.0 39.0 - 52.0 %   MCV 100.2 (H) 80.0 - 100.0 fL   MCH 30.8 26.0 - 34.0 pg   MCHC 30.7 30.0 - 36.0 g/dL   RDW 14.1 11.5 - 15.5 %   Platelets 305 150 - 400 K/uL   nRBC 0.0 0.0 - 0.2 %    Comment: Performed at  [facility], [place] [address], [place], [place] [id number]  Basic metabolic panel     Status: Abnormal   Collection Time: 07/20/19  2:56 AM  Result Value Ref Range   Sodium 146 (H) 135 - 145 mmol/L   Potassium 3.2 (L) 3.5 - 5.1 mmol/L   Chloride 99 98 - 111 mmol/L   CO2 37 (H) 22 - 32 mmol/L   Glucose, Bld 107 (H) 70 - 99 mg/dL   BUN 35 (H) 8 - 23 mg/dL   Creatinine, Ser 1.20 0.61 - 1.24 mg/dL   Calcium 8.5 (L) 8.9 - 10.3 mg/dL   GFR calc non Af Amer 60 (L) >60 mL/min   GFR calc Af Amer >60 >60 mL/min   Anion gap 10 5 - 15    Comment: Performed at [facility] [address], [place], [place] [address]  Hepatic function panel     Status:  Abnormal   Collection Time: 07/20/19  2:56 AM  Result Value Ref Range   Total Protein 5.9 (L) 6.5 - 8.1 g/dL   Albumin 2.3 (L) 3.5 - 5.0 g/dL   AST 177 (H) 15 - 41 U/L   ALT 220 (H) 0 - 44 U/L   Alkaline Phosphatase 109 38 - 126 U/L   Total Bilirubin 0.4 0.3 - 1.2 mg/dL   Bilirubin, Direct <0.1 0.0 - 0.2 mg/dL   Indirect Bilirubin NOT CALCULATED 0.3 - 0.9 mg/dL    Comment: Performed at [facility] [address], [place], [place] [address]  Magnesium     Status: None   Collection Time: 07/20/19  2:56 AM  Result Value Ref Range   Magnesium 2.3 1.7 - 2.4 mg/dL    Comment: Performed at [facility] [address] [place], [place] [address]  Glucose, capillary     Status: Abnormal   Collection Time: 07/20/19  4:25 AM  Result Value Ref Range   Glucose-Capillary 109 (H) 70 - 99 mg/dL  Glucose, capillary     Status: None   Collection Time: 07/20/19  8:19 AM  Result Value Ref Range   Glucose-Capillary 99 70 - 99 mg/dL  Glucose, capillary     Status: Abnormal   Collection Time: 07/20/19 11:39 AM  Result Value Ref Range   Glucose-Capillary 120 (H) 70 - 99 mg/dL  Potassium     Status: None   Collection Time: 07/20/19  6:28 PM  Result Value Ref Range   Potassium 4.2 3.5 - 5.1 mmol/L    Comment: DELTA CHECK NOTED NO  VISIBLE HEMOLYSIS Performed at [facility], [place] [address] [place], [place] [address]   Glucose, capillary     Status: None   Collection Time: 07/20/19  7:28 PM  Result Value Ref Range   Glucose-Capillary 99 70 - 99 mg/dL  Glucose, capillary     Status: Abnormal   Collection Time: 07/20/19 11:33 PM  Result Value Ref Range   Glucose-Capillary 118 (H) 70 - 99 mg/dL  CBC     Status: Abnormal   Collection Time: 07/21/19  3:53 AM  Result Value Ref Range   WBC 11.5 (H) 4.0 - 10.5 K/uL   RBC 4.01 (L) 4.22 - 5.81 MIL/uL   Hemoglobin 12.3 (L) 13.0 - 17.0 g/dL   HCT 39.5 39.0 - 52.0 %   MCV 98.5 80.0 - 100.0 fL   MCH 30.7 26.0 - 34.0 pg   MCHC 31.1 30.0 - 36.0 g/dL   RDW 14.1 11.5 - 15.5 %   Platelets 284 150 - 400 K/uL   nRBC 0.0 0.0 - 0.2 %    Comment: Performed at [facility], [place]. [address], [place], [place] [id number]  Basic metabolic panel     Status: Abnormal   Collection Time: 07/21/19  3:53 AM  Result Value Ref Range   Sodium 147 (H) 135 - 145 mmol/L   Potassium 4.0 3.5 - 5.1 mmol/L   Chloride 104 98 - 111 mmol/L   CO2 33 (H) 22 - 32 mmol/L   Glucose, Bld 146 (H) 70 - 99 mg/dL   BUN 34 (H) 8 - 23 mg/dL   Creatinine, Ser 1.22 0.61 - 1.24 mg/dL   Calcium 8.5 (L) 8.9 - 10.3 mg/dL   GFR calc non Af Amer 58 (L) >60 mL/min   GFR calc Af Amer >60 >60 mL/min   Anion gap 10 5 - 15  Comment: Performed at [facility], [place] [address] [place], [place] [address]  Hepatic function panel     Status: Abnormal   Collection Time: 07/21/19  3:53 AM  Result Value Ref Range   Total Protein 5.6 (L) 6.5 - 8.1 g/dL   Albumin 2.3 (L) 3.5 - 5.0 g/dL   AST 200 (H) 15 - 41 U/L   ALT 283 (H) 0 - 44 U/L   Alkaline Phosphatase 113 38 - 126 U/L   Total Bilirubin 0.5 0.3 - 1.2 mg/dL   Bilirubin, Direct 0.1 0.0 - 0.2 mg/dL   Indirect Bilirubin 0.4 0.3 - 0.9 mg/dL    Comment: Performed at [facility] [address], [place], [place] [address]  Glucose, capillary      Status: Abnormal   Collection Time: 07/21/19  3:56 AM  Result Value Ref Range   Glucose-Capillary 131 (H) 70 - 99 mg/dL  Glucose, capillary     Status: Abnormal   Collection Time: 07/21/19  7:52 AM  Result Value Ref Range   Glucose-Capillary 139 (H) 70 - 99 mg/dL  Glucose, capillary     Status: Abnormal   Collection Time: 07/21/19 12:05 PM  Result Value Ref Range   Glucose-Capillary 114 (H) 70 - 99 mg/dL   CT HEAD WO CONTRAST  Result Date: 07/19/2019 CLINICAL DATA:  Recent large stroke; mental status change, unknown cause. EXAM: CT HEAD WITHOUT CONTRAST TECHNIQUE: Contiguous axial images were obtained from the base of the skull through the vertex without intravenous contrast. COMPARISON:  Brain MRI 06/19/2019 FINDINGS: Brain: Redemonstrated, now subacute, large left PCA territory infarct. As before, this infarct involves the medial aspects of the left temporal and occipital lobes, left thalamus, posterior limb of left internal capsule and splenium of corpus callosum. Involvement of the left mamillary body and left cerebral peduncle were better appreciated on prior MRI 06/19/2019. There is now petechial hemorrhage and/or cortical laminar necrosis within portions of the paramedian left parietooccipital lobe infarction territory (for instance as seen on series 3, image 16). A small subacute cortical infarct involving the left frontal lobe precentral gyrus was better appreciated on prior MRI. Redemonstrated chronic infarcts within the right temporal and occipital lobes. Also redemonstrated are chronic lacunar infarcts within the bilateral cerebellar hemispheres. No new demarcated infarct is identified. No midline shift or extra-axial fluid collection. Stable mild generalized parenchymal atrophy with minimal background chronic small vessel ischemic disease. Vascular: No hyperdense vessel.  Atherosclerotic calcifications. Skull: Normal. Negative for fracture or focal lesion. Sinuses/Orbits: Visualized orbits  demonstrate no acute abnormality. No significant paranasal sinus disease or mastoid effusion at the imaged levels. IMPRESSION: Large, now subacute, left PCA territory infarct. Petechial hemorrhage and/or cortical laminar necrosis is now present within portions of the paramedian left parietooccipital infarction territory. A small, now subacute, cortical infarct within the left frontal lobe precentral gyrus was better appreciated on prior MRI 06/19/2019. Redemonstrated chronic infarcts within the right temporal and occipital lobes and bilateral cerebellar hemispheres. Mild generalized parenchymal atrophy. Minimal background chronic small vessel ischemic disease. Electronically Signed   By: Kellie Simmering DO   On: 07/19/2019 16:56   VAS [place] UPPER EXTREMITY VENOUS DUPLEX  Result Date: 07/21/2019 UPPER VENOUS STUDY  Indications: Edema Limitations: Line on forearm. Comparison Study: No prior exam. Performing Technologist: Baldwin Crown ARDMS, RVT  Examination Guidelines: A complete evaluation includes B-mode imaging, spectral Doppler, color Doppler, and power Doppler as needed of all accessible portions of each vessel. Bilateral testing is considered an  integral part of a complete examination. Limited examinations for reoccurring indications may be performed as noted.  Right Findings: +----------+------------+---------+-----------+----------+-------+ RIGHT     CompressiblePhasicitySpontaneousPropertiesSummary +----------+------------+---------+-----------+----------+-------+ IJV           Full       Yes       Yes                      +----------+------------+---------+-----------+----------+-------+ Subclavian    Full       Yes       Yes                      +----------+------------+---------+-----------+----------+-------+ Axillary      Full       Yes       Yes                      +----------+------------+---------+-----------+----------+-------+ Brachial      Full       Yes       Yes                       +----------+------------+---------+-----------+----------+-------+ Radial        Full                                          +----------+------------+---------+-----------+----------+-------+ Ulnar         Full                                          +----------+------------+---------+-----------+----------+-------+ Cephalic      None                                   Acute  +----------+------------+---------+-----------+----------+-------+ Basilic       Full                                          +----------+------------+---------+-----------+----------+-------+ Thrombus seen in cephalic vein at antecutibal fossa, possibly prior IV site.  Summary:  Right: No evidence of deep vein thrombosis in the upper extremity. Findings consistent with acute superficial vein thrombosis involving the right cephalic vein.  *See table(s) above for measurements and observations.    Preliminary     Medical Problem List and Plan: 1.  Deficits with mobility, transfers, cognition secondary to left brain infarcts.  -patient may shower  -ELOS/Goals: 27-32 days/Mod/Max A  Admit to CIR 2. Right antecubital DVT/ Antithrombotics: -DVT/anticoagulation:  Pharmaceutical: Lovenox--monitor  -antiplatelet therapy: DAPT.  3. Pain Management: N/A 4. Mood: LCSW to follow for evaluation and support as mentation improves.   -antipsychotic agents: N/A 5. Neuropsych: This patient is not capable of making decisions on his own behalf. 6. Skin/Wound Care: Air mattress overlay for pressure relief.  7. Fluids/Electrolytes/Nutrition: NPO. Continue to monitor fluid status with I/O.  8. Acute hypercarbic respiratory failure: Now on BIPAP at nights with oxygen per [place]--PCO2- 53.8. Encourage pulmonary toilet as able.    Supplemental oxygen dependent 9. Post stroke dysphagia with recurrent aspiration events: Strict NPO. Tube feeds with water flushes.   Advance diet as  tolerated 10. Afib  with RVR: NO BB due to soft BP. Monitor HR with increased mobility  Continue amiodarone 200 mg bid and taper by 200 mg weekly to off.  11. 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 Continue lasix daily with DAPT and statin--no ACE/ARB/BB due to soft BP.   Daily weights 12. Sleep wake disruption: Will order sleep chart--used to sleep almost till noon PTA.  May need to schedule melatonin--family does not want any sedatives. BIPAP at nights will hopefully help with better sleep.   60. ASCVD with B-CAS/chronic R-PCA/intracranial stenosis: On DAPT with statin. Avoid hypotension.  14. Abnormal LFTs: Question due to shocked liver. CMP ordered.  Bary Leriche, PA-C 07/21/2019  I have personally performed a face to face diagnostic evaluation, including, but not limited to relevant history and physical exam findings, of this patient and developed relevant assessment and plan.  Additionally, I have reviewed and concur with the physician assistant's documentation above.  Delice Lesch, MD, ABPMR

## 2019-07-21 NOTE — Discharge Summary (Signed)
Physician Discharge Summary  Levi Pruitt [id number] DOB: 21-Mar-1946 DOA: 07/15/2019  PCP: Patient, No Pcp Per  Admit date: 07/15/2019 Discharge date: 07/21/2019  Admitted From: CIR Disposition: CIR  Recommendations for Outpatient Follow-up:  1. Follow up with CIR provider at earliest convenience.  He will need repeat CBC/BMP in the next few days 2. Outpatient follow-up with cardiology 3. Consider hospice/comfort measures if condition worsens.  Recommend continued follow-up with palliative care as an outpatient. 4. Follow up in ED if symptoms worsen or new appear   Home Health: No Equipment/Devices: Oxygen via nasal cannula  Discharge Condition: Guarded to poor CODE STATUS: Partial with no intubation Diet recommendation: Tube feeding as per dietary recommendations  Brief/Interim Summary: 74 year old male with history of recent CVA with right-sided hemiparesis/dysphagia requiring PEG tube placement and complicated by inferior wall MI with subsequent discharge to CIR on 07/07/2019.  In CIR, his condition worsened with worsened shortness of breath with chest x-ray showing worsening infiltrates; he was given intravenous Lasix and readmitted.  Hospital course complicated by waxing and waning mental status, respiratory distress, requiring BiPAP and additional IV Lasix.  He also went into A. fib with RVR with associated hypotension and started on IV amiodarone.  Cardiology was consulted.  Palliative care team was also consulted.  Overall prognosis is very poor; patient is partial code.  He has completed antibiotic treatment and cardiology has changed him to Lasix and amiodarone via PEG tube.  He will be discharged back to CIR today.  Discharge Diagnoses:   Acute hypoxic respiratory failure Sepsis: Present on admission Aspiration pneumonia -Patient remains at risk for recurrent aspiration.  Presented with respiratory failure probably from aspiration and fluid overload. -Intermittently required  BiPAP.  Currently on 4 L oxygen by nasal cannula. -Was treated with IV Zosyn which was transitioned to Unasyn IV.  No need for antibiotics on discharge. -Also treated with intravenous Lasix and subsequently switched to oral Lasix by cardiology. -Tube feedings have been resumed.  Acute on chronic combined systolic and diastolic heart failure -Echo from 06/25/2019 showed EF of 30 to 35% with grade 3 diastolic dysfunction -Treated with intravenous Lasix which has been switched to Lasix via tube by cardiology.  Strict input and output.  Daily weights.  Fluid restriction.  Outpatient follow-up with cardiology.  Beta-blocker/ACE inhibitor/ARB and spironolactone can not be given because of soft blood pressure.  Paroxysmal A. fib with RVR -Required intravenous amiodarone which has been switched to amiodarone via tube.  Cardiology recommends amiodarone 200 mg twice a day for 1 week then 200 mg daily x1 week then discontinue.  No anticoagulation recommended by cardiology.  Cardiology has signed off.  Outpatient follow-up with cardiology.  Recent CVA with residual weakness and dysphagia Acute metabolic encephalopathy -Continue aspirin, Plavix and statin.  Continue tube feeding. -Outpatient follow-up with neurology -Mental status is waxing and waning.  Monitor. -Repeat CT head on 07/19/2019 showed baseline atrophy, subacute large PCA/small cortical infarct with petechial hemorrhage and/or cortical laminar necrosis noted within portions of the left parieto-occipital infarction territory.  Dual antiplatelet therapy has been continued.  Recent acute inferior STEMI -Patient was medically managed during last hospitalization in January 2021.  -He is not a candidate for any cardiac intervention.   Severe carotid stenosis, PCA, vertebral stenosis -Outpatient follow-up with vascular surgery.  Avoid low BP.  Continue aspirin, Plavix and statin.  CKD stage IIIa -Baseline creatinine around 1.2-1.4.  Currently  stable.  Outpatient follow-up  LFT elevation -Likely secondary to sepsis versus congestion.  Stable.  Lipitor continued because of recent acute stroke.  Outpatient follow-up.  8 mm parotid neoplasm -Recommend follow-up with ENT once acute issues subside  Right greater than left upper extremity swelling -Duplex ultrasound reveals superficial cephalic vein thrombosis; most likely IV related.  No need for anticoagulation with the same.  Moderate protein calorie malnutrition -Continue PEG tube feeding  Generalized deconditioning/overall very poor prognosis -Prognosis is very poor given.  Currently CODE STATUS is partial with no intubation.  Palliative care has had multiple discussions with the family.  If condition worsens, recommend comfort measures/hospice.  Recommend outpatient palliative care follow-up.  Discharge Instructions  Discharge Instructions    Ambulatory referral to Cardiology   Complete by: As directed      Allergies as of 07/21/2019   No Known Allergies     Medication List    STOP taking these medications   carvedilol 3.125 MG tablet Commonly known as: COREG     TAKE these medications   amiodarone 200 MG tablet Commonly known as: PACERONE Place 1 tablet (200 mg total) into feeding tube 2 (two) times daily.   aspirin 81 MG chewable tablet Place 4 tablets (324 mg total) into feeding tube daily.   atorvastatin 80 MG tablet Commonly known as: LIPITOR Place 1 tablet (80 mg total) into feeding tube daily at 6 PM.   clopidogrel 75 MG tablet Commonly known as: PLAVIX Place 1 tablet (75 mg total) into feeding tube daily.   enoxaparin 40 MG/0.4ML injection Commonly known as: LOVENOX Inject 0.4 mLs (40 mg total) into the skin daily.   feeding supplement (JEVITY 1.5 CAL/FIBER) Liqd Take 1,000 mLs by mouth continuous.   free water Soln Place 200 mLs into feeding tube every 6 (six) hours. What changed: how much to take   furosemide 40 MG tablet Commonly  known as: LASIX Place 0.5 tablets (20 mg total) into feeding tube daily. What changed: how much to take   insulin aspart 100 UNIT/ML injection Commonly known as: novoLOG Inject 0-9 Units into the skin every 4 (four) hours.   ipratropium-albuterol 0.5-2.5 (3) MG/3ML Soln Commonly known as: DUONEB Take 3 mLs by nebulization every 6 (six) hours as needed.   Melatonin 3 MG Tabs Take 2 tablets (6 mg total) by mouth at bedtime as needed (insomnia).   methylphenidate 5 MG tablet Commonly known as: RITALIN Take 1 tablet (5 mg total) by mouth 2 (two) times daily with breakfast and lunch.      Follow-up Information    Palliative care Follow up.   Why: At earliest convenience         No Known Allergies  Consultations:  Palliative care/cardiology   Procedures/Studies: CT ABDOMEN WO CONTRAST  Result Date: 07/01/2019 CLINICAL DATA:  Previous stroke. Preop planning for gastrostomy placement. EXAM: CT ABDOMEN WITHOUT CONTRAST TECHNIQUE: Multidetector CT imaging of the abdomen was performed following the standard protocol without IV contrast. COMPARISON:  None. FINDINGS: Lower chest: Small pleural effusions right greater than left. Patchy airspace opacity centrally in the visualized lung bases. No pericardial effusion. Hepatobiliary: No focal liver abnormality is seen. No gallstones, gallbladder wall thickening, or biliary dilatation. Pancreas: Unremarkable. No pancreatic ductal dilatation or surrounding inflammatory changes. Spleen: Normal in size without focal abnormality. Adrenals/Urinary Tract: Unremarkable adrenal glands. No hydronephrosis. 2.6 cm low-attenuation mid left renal lesion possibly cyst but incompletely characterized. Stomach/Bowel: Feeding tube through the decompressed stomach into the proximal jejunum. There is a small window for percutaneous gastrostomy placement, which may be improved with gastric  distension; recommend concurrent colonic opacification. The small bowel is  decompressed. retained oral contrast material in the visualized colon and appendix, which are otherwise unremarkable. Vascular/Lymphatic: Aortoiliac atherosclerosis (ICD10-170.0). No abdominal or mesenteric adenopathy. Other: No ascites. No free air. Musculoskeletal: Multilevel lumbar spondylitic change. Bilateral sacroiliitis. No fracture or worrisome bone lesion. IMPRESSION: 1. There is a small window for percutaneous gastrostomy placement, which may be improved with gastric distension; recommend concurrent colonic opacification. 2. Small bilateral pleural effusions right greater than left. Electronically Signed   By: Lucrezia Europe M.D.   On: 07/01/2019 07:47   am PNA cxr  Result Date: 06/25/2019 CLINICAL DATA:  Pneumonia EXAM: CHEST  1 VIEW COMPARISON:  06/24/2019 FINDINGS: Enteric tube courses below the diaphragm, beyond the inferior margin of the film. Stable cardiomegaly. Continued worsening of diffuse airspace opacities throughout both lungs, most confluent within the right upper lobe. No pleural effusion or pneumothorax. IMPRESSION: Continued worsening of diffuse airspace opacities throughout both lungs, most confluent within the right upper lobe. Electronically Signed   By: Davina Poke D.O.   On: 06/25/2019 09:48   DG Chest 2 View  Result Date: 07/09/2019 CLINICAL DATA:  In-patient encounter for arterial ischemic stroke. EXAM: CHEST - 2 VIEW COMPARISON:  July 07, 2019. FINDINGS: Moderate severity bilateral infiltrates are seen. This is most prominent within the bilateral apices and bilateral lung bases and is predominant stable in severity when compared to the prior study. Small bilateral pleural effusions are noted. No pneumothorax is identified. The cardiac silhouette is moderately enlarged. Degenerative changes seen throughout the thoracic spine. IMPRESSION: 1. Moderate severity bilateral pulmonary infiltrates, stable in severity when compared to the prior study. 2. Small bilateral pleural  effusions. Electronically Signed   By: Virgina Norfolk M.D.   On: 07/09/2019 17:54   DG Chest 2 View  Result Date: 07/07/2019 CLINICAL DATA:  Evaluate pneumonia. EXAM: CHEST - 2 VIEW COMPARISON:  July 02, 2019 FINDINGS: The nasogastric tube seen on the prior study has been removed. Moderate severity patchy infiltrates are seen bilaterally. This is most prominent within the mid right lung, left lung base and bilateral apices and is mildly increased in severity when compared to the prior exam. There is no evidence of a pleural effusion or pneumothorax. The heart size and mediastinal contours are within normal limits. The visualized skeletal structures are unremarkable. IMPRESSION: 1. Moderate severity bilateral infiltrates, mildly increased in severity when compared to the prior study dated July 02, 2019. Electronically Signed   By: Virgina Norfolk M.D.   On: 07/07/2019 18:57   DG Abd 1 View  Result Date: 06/21/2019 CLINICAL DATA:  74 year old male with dysphagia. EXAM: ABDOMEN - 1 VIEW COMPARISON:  None. FINDINGS: Single view fluoroscopic image of the upper abdomen provided. An enteric tube is noted with tip in the distal duodenum. Contrast injected via the tube opacifies distal duodenum. IMPRESSION: Enteric tube with tip in the distal duodenum. Electronically Signed   By: Anner Crete M.D.   On: 06/21/2019 16:55   CT HEAD WO CONTRAST  Result Date: 07/19/2019 CLINICAL DATA:  Recent large stroke; mental status change, unknown cause. EXAM: CT HEAD WITHOUT CONTRAST TECHNIQUE: Contiguous axial images were obtained from the base of the skull through the vertex without intravenous contrast. COMPARISON:  Brain MRI 06/19/2019 FINDINGS: Brain: Redemonstrated, now subacute, large left PCA territory infarct. As before, this infarct involves the medial aspects of the left temporal and occipital lobes, left thalamus, posterior limb of left internal capsule and splenium of corpus  callosum. Involvement of  the left mamillary body and left cerebral peduncle were better appreciated on prior MRI 06/19/2019. There is now petechial hemorrhage and/or cortical laminar necrosis within portions of the paramedian left parietooccipital lobe infarction territory (for instance as seen on series 3, image 16). A small subacute cortical infarct involving the left frontal lobe precentral gyrus was better appreciated on prior MRI. Redemonstrated chronic infarcts within the right temporal and occipital lobes. Also redemonstrated are chronic lacunar infarcts within the bilateral cerebellar hemispheres. No new demarcated infarct is identified. No midline shift or extra-axial fluid collection. Stable mild generalized parenchymal atrophy with minimal background chronic small vessel ischemic disease. Vascular: No hyperdense vessel.  Atherosclerotic calcifications. Skull: Normal. Negative for fracture or focal lesion. Sinuses/Orbits: Visualized orbits demonstrate no acute abnormality. No significant paranasal sinus disease or mastoid effusion at the imaged levels. IMPRESSION: Large, now subacute, left PCA territory infarct. Petechial hemorrhage and/or cortical laminar necrosis is now present within portions of the paramedian left parietooccipital infarction territory. A small, now subacute, cortical infarct within the left frontal lobe precentral gyrus was better appreciated on prior MRI 06/19/2019. Redemonstrated chronic infarcts within the right temporal and occipital lobes and bilateral cerebellar hemispheres. Mild generalized parenchymal atrophy. Minimal background chronic small vessel ischemic disease. Electronically Signed   By: Kellie Simmering DO   On: 07/19/2019 16:56   IR GASTROSTOMY TUBE MOD SED  Result Date: 07/06/2019 INDICATION: 74 year old male with dysphagia following cerebrovascular accident. EXAM: Fluoroscopically guided placement of percutaneous pull-through gastrostomy tube Interventional Radiologist:  Criselda Peaches,  MD MEDICATIONS: 2 g Ancef, 1 mg glucagon; Antibiotics were administered within 1 hour of the procedure. ANESTHESIA/SEDATION: Versed 1.5 mg IV; Fentanyl 75 mcg IV Moderate Sedation Time:  8 minutes The patient was continuously monitored during the procedure by the interventional radiology nurse under my direct supervision. CONTRAST:  66mL OMNIPAQUE IOHEXOL 300 MG/ML  SOLN FLUOROSCOPY TIME:  Fluoroscopy Time: 4 minutes 54 seconds (17 mGy). COMPLICATIONS: None immediate. PROCEDURE: Informed written consent was obtained from the patient after a thorough discussion of the procedural risks, benefits and alternatives. All questions were addressed. Maximal Sterile Barrier Technique was utilized including caps, mask, sterile gowns, sterile gloves, sterile drape, hand hygiene and skin antiseptic. A timeout was performed prior to the initiation of the procedure. Maximal barrier sterile technique utilized including caps, mask, sterile gowns, sterile gloves, large sterile drape, hand hygiene, and chlorhexadine skin prep. An angled catheter was advanced over a wire under fluoroscopic guidance through the nose, down the esophagus and into the body of the stomach. The stomach was then insufflated with several 100 ml of air. Fluoroscopy confirmed location of the gastric bubble, as well as inferior displacement of the barium stained colon. Under direct fluoroscopic guidance, a single T-tack was placed, and the anterior gastric wall drawn up against the anterior abdominal wall. Percutaneous access was then obtained into the mid gastric body with an 18 gauge sheath needle. Aspiration of air, and injection of contrast material under fluoroscopy confirmed needle placement. An Amplatz wire was advanced in the gastric body and the access needle exchanged for a 9-French vascular sheath. A snare device was advanced through the vascular sheath and an Amplatz wire advanced through the angled catheter. The Amplatz wire was successfully snared  and this was pulled up through the esophagus and out the mouth. A 20-French Alinda Dooms MIC-PEG tube was then connected to the snare and pulled through the mouth, down the esophagus, into the stomach and out to the anterior  abdominal wall. Hand injection of contrast material confirmed intragastric location. The T-tack retention suture was then cut. The pull through peg tube was then secured with the external bumper and capped. The patient will be observed for several hours with the newly placed tube on low wall suction to evaluate for any post procedure complication. The patient tolerated the procedure well, there is no immediate complication. IMPRESSION: Successful placement of a 20 French pull through gastrostomy tube. Electronically Signed   By: Jacqulynn Cadet M.D.   On: 07/06/2019 18:43   DG CHEST PORT 1 VIEW  Result Date: 07/19/2019 CLINICAL DATA:  Respiratory failure. EXAM: PORTABLE CHEST 1 VIEW COMPARISON:  Chest x-ray dated July 16, 2019. FINDINGS: Stable cardiomediastinal silhouette. Improving consolidation in the right upper lobe. Additional patchy airspace disease in the left upper and mid lung has mildly improved as well. No pneumothorax or large pleural effusion. No acute osseous abnormality. IMPRESSION: Improving multifocal pneumonia. Electronically Signed   By: Titus Dubin M.D.   On: 07/19/2019 11:40   DG CHEST PORT 1 VIEW  Result Date: 07/16/2019 CLINICAL DATA:  Short of breath, stroke EXAM: PORTABLE CHEST 1 VIEW COMPARISON:  07/15/2019, 07/11/2019 FINDINGS: Single frontal view of the chest demonstrates progressive dense consolidation within the right upper lobe. Persistent multifocal airspace disease elsewhere throughout the lungs, greatest in the left perihilar region. No large effusion or pneumothorax. Cardiac silhouette is stable. IMPRESSION: 1. Findings consistent with multifocal pneumonia, with increasing consolidation right upper lobe. Electronically Signed   By: Randa Ngo M.D.   On: 07/16/2019 08:51   DG Chest Port 1 View  Result Date: 07/15/2019 CLINICAL DATA:  Acute respiratory distress EXAM: PORTABLE CHEST 1 VIEW COMPARISON:  07/11/2019 FINDINGS: Cardiac shadow is stable. Persistent bilateral infiltrates are seen worst in the right upper lobe stable from the prior exam. Slight increase in the degree of infiltrative density in lung is seen. No acute bony abnormality is noted. IMPRESSION: Persistent consolidation in the right upper lobe with increasing left-sided infiltrate when compared with the prior study. Electronically Signed   By: Inez Catalina M.D.   On: 07/15/2019 02:34   DG Chest Port 1 View  Result Date: 07/11/2019 CLINICAL DATA:  Acute respiratory distress EXAM: PORTABLE CHEST 1 VIEW COMPARISON:  07/09/2019 FINDINGS: Cardiac shadow is stable. Increasing bilateral infiltrates are seen particularly in the right upper lobe. No sizable effusion is seen. No bony abnormality is noted. IMPRESSION: Increase in bilateral infiltrates particularly in the right upper lobe. Electronically Signed   By: Inez Catalina M.D.   On: 07/11/2019 22:37   DG CHEST PORT 1 VIEW  Result Date: 07/02/2019 CLINICAL DATA:  74 year old male with shortness of breath. EXAM: PORTABLE CHEST 1 VIEW COMPARISON:  Chest radiograph dated 06/25/2019. FINDINGS: Feeding tube extends below the diaphragm. Bilateral confluent airspace opacities may represent edema or multifocal pneumonia or combination. Probable small bilateral pleural effusions. No pneumothorax. Stable cardiac silhouette. No acute osseous pathology. IMPRESSION: Bilateral confluent densities may represent pneumonia or edema. Clinical correlation and follow-up recommended. Electronically Signed   By: Anner Crete M.D.   On: 07/02/2019 20:48   DG CHEST PORT 1 VIEW  Result Date: 06/24/2019 CLINICAL DATA:  Follow-up so the HF. EXAM: PORTABLE CHEST 1 VIEW COMPARISON:  06/23/2019 FINDINGS: Opacity has become more apparent in the upper  lobes, right greater than left. There is more diffuse prominence of the bronchovascular markings, similar to the previous day's exam. No convincing pleural effusion.  No pneumothorax. Orogastric tube passes into  the stomach below the included field of view. IMPRESSION: 1. Mild increase in upper lung zone airspace opacities, right greater than left, with persistent prominent bronchovascular markings. Findings may reflect mild worsening pulmonary edema with asymmetric upper lobe airspace edema, versus multifocal infection. Electronically Signed   By: Lajean Manes M.D.   On: 06/24/2019 11:14   DG CHEST PORT 1 VIEW  Result Date: 06/23/2019 CLINICAL DATA:  Congestive heart failure. EXAM: PORTABLE CHEST 1 VIEW COMPARISON:  June 22, 2019. FINDINGS: Stable cardiomegaly. Feeding tube is seen entering stomach. No pneumothorax or pleural effusion is noted. Minimal central pulmonary vascular congestion may be present. No significant consolidative process is noted. Bony thorax is unremarkable. IMPRESSION: 1. Stable cardiomegaly. Minimal central pulmonary vascular congestion may be present. No significant consolidative process is noted. 2. No evidence of pulmonary edema. Electronically Signed   By: Marijo Conception M.D.   On: 06/23/2019 07:46   DG CHEST PORT 1 VIEW  Result Date: 06/22/2019 CLINICAL DATA:  Shortness of breath. EXAM: PORTABLE CHEST 1 VIEW COMPARISON:  June 20, 2019 FINDINGS: Feeding tube tip is below the diaphragm. No pneumothorax. There is cardiomegaly with pulmonary vascular congestion. There is hazy alveolar opacity bilaterally in a predominantly perihilar distribution. There is also mild interstitial thickening. There is an equivocal left pleural effusion. No adenopathy. No bone lesions. IMPRESSION: Cardiomegaly with pulmonary vascular congestion. Perihilar alveolar opacity, likely alveolar edema. There may be mild interstitial edema superimposed. The overall appearance is most indicative of  congestive heart failure. A degree of atypical organism pneumonia superimposed cannot be excluded given the areas of perihilar airspace opacity. Feeding tube tip is below the diaphragm. Electronically Signed   By: Lowella Grip III M.D.   On: 06/22/2019 07:46   DG Abd Portable 1V  Result Date: 06/29/2019 CLINICAL DATA:  Stroke.  Peg tube placement needed. EXAM: PORTABLE ABDOMEN - 1 VIEW COMPARISON:  06/21/2019. FINDINGS: Feeding tube noted with its tip over the proximal small bowel. No gastric or bowel distention. Contrast in the colon no free air. No acute bony abnormality. IMPRESSION: Feeding tube noted with its tip over the proximal small bowel. No gastric or bowel distention. Electronically Signed   By: Marcello Moores  Register   On: 06/29/2019 16:19   DG Swallowing Func-Speech Pathology  Result Date: 06/28/2019 Objective Swallowing Evaluation: Type of Study: MBS-Modified Barium Swallow Study  Patient Details Name: Levi Pruitt MRN: [id number] Date of Birth: 1945-09-09 Today's Date: 06/28/2019 Time: SLP Start Time (ACUTE ONLY): 1240 -SLP Stop Time (ACUTE ONLY): 1254 SLP Time Calculation (min) (ACUTE ONLY): 14 min Past Medical History: Past Medical History: Diagnosis Date . 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 HPI:  74 year old Caucasian male, obese, with no other documented past medical history. Patient is not able to provide and history.  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  Subjective: Pt was lethargic Assessment / Plan / Recommendation CH[facility] IP CLINICAL IMPRESSIONS 06/28/2019 Clinical Impression Pt was seen for a Modified Barium Swallow Study and he presents with moderate oropharyngeal dysphagia with resultant silent aspiration  of thin liquid and deep laryngeal penetration with nectar-thick liquid on today's examination.  Laryngeal penetration and aspiration were secondary to premature spillage to the laryngeal vestibule/pyriform sinuses with a delayed swallow initiation and delayed  laryngeal closure.  No laryngeal penetration or aspiration was observed with honey-thick liquid or pureed solids.  Pt was observed to be lethargic during this evaluation and he required verbal encouragement and extra time for AP transport and swallow initiation.  He was unable to trigger a swallow with tsps of thin liquid or honey-thick liquid and he instead pocketed the boluses in his anterior sulci. Oral phase was remarkable for reduced lingual control resulting in premature spillage to the larynx/pharynx, reduced lingual coordination resulting in lingual pumping, and reduced lingual strength resulting in trace-moderate oral residue.  Intermittent piecemeal deglutition was also observed.  Pharyngeal phase was remarkable for reduced BOT retraction and reduced hyolaryngeal excursion resulting in vallecular residue.   Of note, cortrak was in place for this evaluation but it did not interfere with epiglottic inversion.  Recommend initiation of Dysphagia 1 (puree) solids and honey-thick liquids with the following compensatory strategies: 1) Awake/alert 2) Small bites/sips 3) Sit upright as possible 4) Intermittent dry swallows 5) Check for oral clearance.  Despite initiation of a PO diet, there are still concerns that the patient may not meet his nutritional needs via PO intake alone secondary to lethargy.  Spoke with MD regarding results and recommendations who additionally relayed them to palliative care NP.   SLP Visit Diagnosis Dysphagia, oropharyngeal phase (R13.12) Attention and concentration deficit following -- Frontal lobe and executive function deficit following -- Impact on safety and function Moderate aspiration risk   CHL IP TREATMENT RECOMMENDATION  06/28/2019 Treatment Recommendations Therapy as outlined in treatment plan below   Prognosis 06/28/2019 Prognosis for Safe Diet Advancement Fair Barriers to Reach Goals Severity of deficits;Time post onset Barriers/Prognosis Comment -- CHL IP DIET RECOMMENDATION 06/28/2019 SLP Diet Recommendations Dysphagia 1 (Puree) solids;Honey thick liquids;Alternative means - temporary Liquid Administration via Cup;Straw Medication Administration Crushed with puree Compensations Slow rate;Small sips/bites;Effortful swallow Postural Changes Seated upright at 90 degrees   CHL IP OTHER RECOMMENDATIONS 06/28/2019 Recommended Consults -- Oral Care Recommendations Oral care BID;Staff/trained caregiver to provide oral care Other Recommendations Order thickener from pharmacy;Remove water pitcher   [facility]L IP FOLLOW UP RECOMMENDATIONS 06/28/2019 Follow up Recommendations Inpatient Rehab   CHL IP FREQUENCY AND DURATION 06/28/2019 Speech Therapy Frequency (ACUTE ONLY) min 2x/week Treatment Duration 2 weeks      CHL IP ORAL PHASE 06/28/2019 Oral Phase Impaired Oral - Pudding Teaspoon -- Oral - Pudding Cup -- Oral - Honey Teaspoon Holding of bolus;Lingual pumping;Pocketing in anterior sulcus Oral - Honey Cup Weak lingual manipulation;Reduced posterior propulsion;Lingual/palatal residue;Premature spillage Oral - Nectar Teaspoon Weak lingual manipulation;Delayed oral transit;Premature spillage;Lingual/palatal residue Oral - Nectar Cup -- Oral - Nectar Straw Delayed oral transit;Weak lingual manipulation;Lingual/palatal residue Oral - Thin Teaspoon Holding of bolus;Pocketing in anterior sulcus Oral - Thin Cup -- Oral - Thin Straw Piecemeal swallowing;Decreased bolus cohesion;Delayed oral transit;Lingual/palatal residue;Weak lingual manipulation Oral - Puree Weak lingual manipulation;Lingual/palatal residue;Delayed oral transit;Premature spillage Oral - Mech Soft -- Oral - Regular -- Oral - Multi-Consistency -- Oral - Pill -- Oral Phase - Comment --  [facility]L  IP PHARYNGEAL PHASE 06/28/2019 Pharyngeal Phase Impaired Pharyngeal- Pudding Teaspoon -- Pharyngeal -- Pharyngeal- Pudding Cup -- Pharyngeal -- Pharyngeal- Honey Teaspoon -- Pharyngeal -- Pharyngeal- Honey Cup Delayed swallow initiation-vallecula;Reduced tongue base retraction;Reduced anterior laryngeal mobility;Pharyngeal residue - valleculae Pharyngeal Material does not enter airway Pharyngeal- Nectar Teaspoon Delayed swallow initiation-pyriform sinuses;Penetration/Aspiration before swallow;Penetration/Aspiration during swallow;Trace aspiration;Reduced airway/laryngeal closure;Reduced tongue base retraction;Reduced anterior laryngeal mobility Pharyngeal Material enters airway, passes BELOW cords without attempt by patient to eject  out (silent aspiration) Pharyngeal- Nectar Cup -- Pharyngeal -- Pharyngeal- Nectar Straw Delayed swallow initiation-pyriform sinuses;Reduced anterior laryngeal mobility;Reduced tongue base retraction Pharyngeal Material does not enter airway Pharyngeal- Thin Teaspoon -- Pharyngeal -- Pharyngeal- Thin Cup -- Pharyngeal -- Pharyngeal- Thin Straw Delayed swallow initiation-pyriform sinuses;Penetration/Aspiration before swallow;Moderate aspiration;Reduced anterior laryngeal mobility;Reduced airway/laryngeal closure;Reduced tongue base retraction;Pharyngeal residue - valleculae Pharyngeal Material enters airway, passes BELOW cords without attempt by patient to eject out (silent aspiration) Pharyngeal- Puree Delayed swallow initiation-vallecula;Pharyngeal residue - valleculae;Reduced anterior laryngeal mobility;Reduced tongue base retraction Pharyngeal Material does not enter airway Pharyngeal- Mechanical Soft -- Pharyngeal -- Pharyngeal- Regular -- Pharyngeal -- Pharyngeal- Multi-consistency -- Pharyngeal -- Pharyngeal- Pill -- Pharyngeal -- Pharyngeal Comment --  [facility] IP CERVICAL ESOPHAGEAL PHASE 06/21/2019 Cervical Esophageal Phase WFL Pudding Teaspoon -- Pudding Cup -- Honey Teaspoon -- Honey  Cup -- Nectar Teaspoon -- Nectar Cup -- Nectar Straw -- Thin Teaspoon -- Thin Cup -- Thin Straw -- Puree -- Mechanical Soft -- Regular -- Multi-consistency -- Pill -- Cervical Esophageal Comment -- Colin Mulders M.S., CCC-SLP Acute Rehabilitation Services Office: [phone] [place] 06/28/2019, 2:24 PM              DG Swallowing Func-Speech Pathology  Result Date: 06/21/2019 Objective Swallowing Evaluation: Type of Study: MBS-Modified Barium Swallow Study  Patient Details Name: Levi Pruitt MRN: [id number] Date of Birth: March 30, 1946 Today's Date: 06/21/2019 Time: SLP Start Time (ACUTE ONLY): 1100 -SLP Stop Time (ACUTE ONLY): 1128 SLP Time Calculation (min) (ACUTE ONLY): 28 min Past Medical History: Past Medical History: Diagnosis Date . 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 HPI:  74 year old Caucasian male, obese, with no other documented past medical history. Patient is not able to provide and history.  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  Subjective: lethargic, arousable with multimodal cues Assessment / Plan / Recommendation CHL IP CLINICAL IMPRESSIONS 06/21/2019 Clinical Impression MBSS reveals moderate oral dysphagia, mild to moderate pharyngeal dyshpagia suspected to be of multifactorial etiology; neurogenic and cognitive decline post CVA. Pt required maximal cueing for improving alertness throughout MBSS.  Oral deficits include right sided oral motor deficits including reduced lingual coordination, oral holding, intermittent right sided anterior spillage, right sided buccal pocketing, and delayed oral transit. Thin liquid by teaspoon bolus, extracted from oral cavity after prolonged oral holding despite cues. Oral suction  utilized post MBSS to assist with removal of oral residuals.  Pharyngeal deficits c/b delay in swallow initiation, decreased timely epiglottic inversion, reduced base of tongue retraction and decreased laryngeal elevation. This allowed for intermittent pre swallow penetration of nectar thick liquids, intermittent during the swallow penetration of honey thick liquids by cup. Despite no aspiration visualized on MBSS this date, risk remains significantly increased due to fluctuation in patients mentation and deficits noted on exam. Recommend continue NPO with short term alternative nutrition with PO trials with SLP (nectar thick, honey thick, and puree POs) and hopes of diet advancement. SLP to follow up.   SLP Visit Diagnosis Dysphagia, oropharyngeal phase (R13.12) Attention and concentration deficit following -- Frontal lobe and executive function deficit following -- Impact on safety and function Moderate aspiration risk;Severe aspiration risk;Risk for inadequate nutrition/hydration   CHL IP TREATMENT RECOMMENDATION 06/21/2019 Treatment Recommendations Therapy as outlined in treatment plan below  Prognosis 06/21/2019 Prognosis for Safe Diet Advancement Fair Barriers to Reach Goals Time post onset Barriers/Prognosis Comment -- [facility] IP DIET RECOMMENDATION 06/21/2019 SLP Diet Recommendations Alternative means - temporary Liquid Administration via -- Medication Administration Via alternative means Compensations -- Postural Changes --   [facility] IP OTHER RECOMMENDATIONS 06/21/2019 Recommended Consults -- Oral Care Recommendations Oral care QID Other Recommendations --   [facility] IP FOLLOW UP RECOMMENDATIONS 06/21/2019 Follow up Recommendations Inpatient Rehab;Skilled Nursing facility   [facility] IP FREQUENCY AND DURATION 06/21/2019 Speech Therapy Frequency (ACUTE ONLY) min 2x/week Treatment Duration 2 weeks      CHL IP ORAL PHASE 06/21/2019 Oral Phase Impaired Oral - Pudding Teaspoon -- Oral - Pudding Cup -- Oral - Honey Teaspoon -- Oral -  Honey Cup Weak lingual manipulation;Right anterior bolus loss;Reduced posterior propulsion;Incomplete tongue to palate contact;Holding of bolus;Lingual/palatal residue;Right pocketing in lateral sulci;Delayed oral transit Oral - Nectar Teaspoon -- Oral - Nectar Cup Right anterior bolus loss;Weak lingual manipulation;Incomplete tongue to palate contact;Reduced posterior propulsion;Holding of bolus;Right pocketing in lateral sulci;Pocketing in anterior sulcus;Lingual/palatal residue;Delayed oral transit Oral - Nectar Straw -- Oral - Thin Teaspoon Holding of bolus;Right anterior bolus loss Oral - Thin Cup -- Oral - Thin Straw -- Oral - Puree Delayed oral transit;Lingual/palatal residue;Weak lingual manipulation;Incomplete tongue to palate contact;Reduced posterior propulsion;Decreased bolus cohesion Oral - Mech Soft -- Oral - Regular -- Oral - Multi-Consistency -- Oral - Pill -- Oral Phase - Comment --  [facility] IP PHARYNGEAL PHASE 06/21/2019 Pharyngeal Phase Impaired Pharyngeal- Pudding Teaspoon -- Pharyngeal -- Pharyngeal- Pudding Cup -- Pharyngeal -- Pharyngeal- Honey Teaspoon -- Pharyngeal -- Pharyngeal- Honey Cup Delayed swallow initiation-pyriform sinuses;Reduced anterior laryngeal mobility;Reduced airway/laryngeal closure;Reduced tongue base retraction;Penetration/Aspiration during swallow;Pharyngeal residue - valleculae;Pharyngeal residue - pyriform;Reduced epiglottic inversion Pharyngeal Material does not enter airway;Material enters airway, remains ABOVE vocal cords then ejected out Pharyngeal- Nectar Teaspoon -- Pharyngeal -- Pharyngeal- Nectar Cup Delayed swallow initiation-pyriform sinuses;Reduced epiglottic inversion;Reduced airway/laryngeal closure;Reduced tongue base retraction;Penetration/Aspiration before swallow Pharyngeal Material does not enter airway;Material enters airway, remains ABOVE vocal cords and not ejected out Pharyngeal- Nectar Straw -- Pharyngeal -- Pharyngeal- Thin Teaspoon Other (Comment)  Pharyngeal -- Pharyngeal- Thin Cup -- Pharyngeal -- Pharyngeal- Thin Straw -- Pharyngeal -- Pharyngeal- Puree Delayed swallow initiation-vallecula;Reduced tongue base retraction;Pharyngeal residue - valleculae;Reduced anterior laryngeal mobility;Reduced laryngeal elevation;Pharyngeal residue - pyriform Pharyngeal -- Pharyngeal- Mechanical Soft -- Pharyngeal -- Pharyngeal- Regular -- Pharyngeal -- Pharyngeal- Multi-consistency -- Pharyngeal -- Pharyngeal- Pill -- Pharyngeal -- Pharyngeal Comment --  CHL IP CERVICAL ESOPHAGEAL PHASE 06/21/2019 Cervical Esophageal Phase WFL Pudding Teaspoon -- Pudding Cup -- Honey Teaspoon -- Honey Cup -- Nectar Teaspoon -- Nectar Cup -- Nectar Straw -- Thin Teaspoon -- Thin Cup -- Thin Straw -- Puree -- Mechanical Soft -- Regular -- Multi-consistency -- Pill -- Cervical Esophageal Comment -- Chelsea E Hartness MA, CCC-SLP Acute Rehabilitation Services 06/21/2019, 12:00 PM              ECHOCARDIOGRAM COMPLETE  Result Date: 06/25/2019   ECHOCARDIOGRAM REPORT   Patient Name:   Levi Pruitt Date of Exam: 06/25/2019 Medical Rec #:  [id number]   Height:       69.0 in Accession #:    [id number]  Weight:       208.3 lb Date of Birth:  Jul 06, 1945   BSA:          2.10 m Patient Age:    41 years    BP:           124/69 mmHg  Patient Gender: M           HR:           69 bpm. Exam Location:  Inpatient Procedure: 2D Echo Indications:    stroke  History:        Patient has prior history of Echocardiogram examinations, most                 recent 06/19/2019. Stroke.  Sonographer:    Jannett Celestine RDCS (AE) Referring Phys: [id number] Rosalin Hawking  Sonographer Comments: Technically difficult study due to poor echo windows. Image acquisition challenging due to patient body habitus. extremely limited mobility (please see comments) IMPRESSIONS  1. Left ventricular ejection fraction, by visual estimation, is 30 to 35%. The left ventricle has moderate to severely decreased function. There is mildly increased left  ventricular hypertrophy.  2. The left ventricle demonstrates regional wall motion abnormalities. Inferior/inferoseptal/apical akinesis  3. Moderately dilated left ventricular internal cavity size.  4. Left ventricular diastolic parameters are consistent with Grade III diastolic dysfunction (restrictive).  5. Elevated left atrial pressure.  6. Global right ventricle has normal systolic function.The right ventricular size is normal.  7. The mitral valve is normal in structure. Trivial mitral valve regurgitation.  8. The tricuspid valve is normal in structure.  9. The aortic valve is tricuspid. Aortic valve regurgitation is not visualized. No evidence of aortic valve sclerosis or stenosis. 10. The pulmonic valve was not well visualized. Pulmonic valve regurgitation is not visualized. 11. TR signal is inadequate for assessing pulmonary artery systolic pressure. 12. The inferior vena cava is normal in size with greater than 50% respiratory variability, suggesting right atrial pressure of 3 mmHg. FINDINGS  Left Ventricle: Left ventricular ejection fraction, by visual estimation, is 30 to 35%. The left ventricle has moderate to severely decreased function. Definity contrast agent was given IV to delineate the left ventricular endocardial borders. The left ventricle demonstrates regional wall motion abnormalities. The left ventricular internal cavity size was moderately dilated left ventricle. There is mildly increased left ventricular hypertrophy. Left ventricular diastolic parameters are consistent with Grade III diastolic dysfunction (restrictive). Elevated left atrial pressure. Right Ventricle: The right ventricular size is normal. No increase in right ventricular wall thickness. Global RV systolic function is has normal systolic function. Left Atrium: Left atrial size was not well visualized. Right Atrium: Right atrial size was not well visualized Pericardium: There is no evidence of pericardial effusion. Mitral Valve:  The mitral valve is normal in structure. Trivial mitral valve regurgitation. Tricuspid Valve: The tricuspid valve is normal in structure. Tricuspid valve regurgitation is not demonstrated. Aortic Valve: The aortic valve is tricuspid. Aortic valve regurgitation is not visualized. The aortic valve is structurally normal, with no evidence of sclerosis or stenosis. Pulmonic Valve: The pulmonic valve was not well visualized. Pulmonic valve regurgitation is not visualized. Pulmonic regurgitation is not visualized. Aorta: The aortic root is normal in size and structure. Venous: The inferior vena cava is normal in size with greater than 50% respiratory variability, suggesting right atrial pressure of 3 mmHg. IAS/Shunts: The interatrial septum was not well visualized.  LEFT VENTRICLE PLAX 2D LVIDd:         6.30 cm  Diastology LVIDs:         4.80 cm  LV e' lateral:   9.57 cm/s LV PW:         1.20 cm  LV E/e' lateral: 10.8 LV IVS:        1.10 cm  LV  e' medial:    3.81 cm/s LVOT diam:     2.10 cm  LV E/e' medial:  27.0 LV SV:         94 ml LV SV Index:   43.13 LVOT Area:     3.46 cm  LEFT ATRIUM         Index LA diam:    4.20 cm 2.00 cm/m  AORTIC VALVE LVOT Vmax:   65.80 cm/s LVOT Vmean:  43.300 cm/s LVOT VTI:    0.136 m  AORTA Ao Root diam: 3.30 cm MITRAL VALVE MV Area (PHT): 4.39 cm              SHUNTS MV PHT:        50.17 msec            Systemic VTI:  0.14 m MV Decel Time: 173 msec              Systemic Diam: 2.10 cm MV E velocity: 103.00 cm/s 103 cm/s MV A velocity: 41.20 cm/s  70.3 cm/s MV E/A ratio:  2.50        1.5  Oswaldo Milian MD Electronically signed by Oswaldo Milian MD Signature Date/Time: 06/25/2019/4:18:14 PM    Final    VAS [place] LOWER EXTREMITY VENOUS (DVT)  Result Date: 07/10/2019  Lower Venous Study Indications: Edema.  Risk Factors: CVA, CHF. Anticoagulation: Heparin. Limitations: Poor ultrasound/tissue interface. Comparison Study: No prior exam. Performing Technologist: Baldwin Crown  ARDMS, RVT  Examination Guidelines: A complete evaluation includes B-mode imaging, spectral Doppler, color Doppler, and power Doppler as needed of all accessible portions of each vessel. Bilateral testing is considered an integral part of a complete examination. Limited examinations for reoccurring indications may be performed as noted.  +---------+---------------+---------+-----------+----------+------------------+ RIGHT    CompressibilityPhasicitySpontaneityPropertiesThrombus Aging     +---------+---------------+---------+-----------+----------+------------------+ CFV      Full           Yes      Yes                                     +---------+---------------+---------+-----------+----------+------------------+ SFJ      Full                                                            +---------+---------------+---------+-----------+----------+------------------+ FV Prox  Full                                                            +---------+---------------+---------+-----------+----------+------------------+ FV Mid   Full                                                            +---------+---------------+---------+-----------+----------+------------------+ FV DistalFull  seen with color                                                          flow               +---------+---------------+---------+-----------+----------+------------------+ PFV      Full                                                            +---------+---------------+---------+-----------+----------+------------------+ POP      Full           Yes      Yes                                     +---------+---------------+---------+-----------+----------+------------------+ PTV      Full                                                            +---------+---------------+---------+-----------+----------+------------------+ PERO      Full                                                            +---------+---------------+---------+-----------+----------+------------------+   +---------+---------------+---------+-----------+----------+------------------+ LEFT     CompressibilityPhasicitySpontaneityPropertiesThrombus Aging     +---------+---------------+---------+-----------+----------+------------------+ CFV      Full           Yes      Yes                                     +---------+---------------+---------+-----------+----------+------------------+ SFJ      Full                                                            +---------+---------------+---------+-----------+----------+------------------+ FV Prox  Full                                                            +---------+---------------+---------+-----------+----------+------------------+ FV Mid   Full                                                            +---------+---------------+---------+-----------+----------+------------------+  FV DistalFull                                         seen with color                                                          flow               +---------+---------------+---------+-----------+----------+------------------+ PFV      Full                                                            +---------+---------------+---------+-----------+----------+------------------+ POP      Full           Yes      Yes                                     +---------+---------------+---------+-----------+----------+------------------+ PTV      Full                                                            +---------+---------------+---------+-----------+----------+------------------+ PERO     Full                                                            +---------+---------------+---------+-----------+----------+------------------+     Summary: Right: There is no  evidence of deep vein thrombosis in the lower extremity. No cystic structure found in the popliteal fossa. Left: There is no evidence of deep vein thrombosis in the lower extremity. No cystic structure found in the popliteal fossa.  *See table(s) above for measurements and observations. Electronically signed by Servando Snare MD on 07/10/2019 at 8:44:09 AM.    Final    VAS [place] UPPER EXTREMITY VENOUS DUPLEX  Result Date: 07/21/2019 UPPER VENOUS STUDY  Indications: Edema Limitations: Line on forearm. Comparison Study: No prior exam. Performing Technologist: Baldwin Crown ARDMS, RVT  Examination Guidelines: A complete evaluation includes B-mode imaging, spectral Doppler, color Doppler, and power Doppler as needed of all accessible portions of each vessel. Bilateral testing is considered an integral part of a complete examination. Limited examinations for reoccurring indications may be performed as noted.  Right Findings: +----------+------------+---------+-----------+----------+-------+ RIGHT     CompressiblePhasicitySpontaneousPropertiesSummary +----------+------------+---------+-----------+----------+-------+ IJV           Full       Yes       Yes                      +----------+------------+---------+-----------+----------+-------+ Subclavian    Full  Yes       Yes                      +----------+------------+---------+-----------+----------+-------+ Axillary      Full       Yes       Yes                      +----------+------------+---------+-----------+----------+-------+ Brachial      Full       Yes       Yes                      +----------+------------+---------+-----------+----------+-------+ Radial        Full                                          +----------+------------+---------+-----------+----------+-------+ Ulnar         Full                                          +----------+------------+---------+-----------+----------+-------+ Cephalic       None                                   Acute  +----------+------------+---------+-----------+----------+-------+ Basilic       Full                                          +----------+------------+---------+-----------+----------+-------+ Thrombus seen in cephalic vein at antecutibal fossa, possibly prior IV site.  Summary:  Right: No evidence of deep vein thrombosis in the upper extremity. Findings consistent with acute superficial vein thrombosis involving the right cephalic vein.  *See table(s) above for measurements and observations.    Preliminary        Subjective: Patient seen and examined at bedside.  He is awake, slightly confused, poor historian.  No overnight fever or vomiting reported.  Discharge Exam: Vitals:   07/21/19 0422 07/21/19 0753  BP:  118/63  Pulse: 64 67  Resp: (!) 21 (!) 25  Temp:  97.9 F (36.6 C)  SpO2: 98% 97%    General: Pt is awake, answers some questions, dysarthric.  Poor historian.  Slightly confused.  Right-sided hemiparesis present. Cardiovascular: rate controlled, S1/S2 + Respiratory: bilateral decreased breath sounds at bases with some scattered crackles.  Tachypneic. Abdominal: Soft, NT, ND, bowel sounds +.  PEG tube present. Extremities: Trace lower extremity edema; bilateral upper extremities also slightly swollen.  No cyanosis    The results of significant diagnostics from this hospitalization (including imaging, microbiology, ancillary and laboratory) are listed below for reference.     Microbiology: Recent Results (from the past 240 hour(s))  Culture, blood (routine x 2)     Status: None   Collection Time: 07/15/19  6:52 AM   Specimen: BLOOD  Result Value Ref Range Status   Specimen Description BLOOD RIGHT ANTECUBITAL  Final   Special Requests   Final    BOTTLES DRAWN AEROBIC ONLY Blood Culture results may not be optimal due to an inadequate volume of blood received in culture bottles   Culture  Final    NO GROWTH  5 DAYS Performed at [facility], [place] [address], [place], [place] [address]    Report Status 07/20/2019 FINAL  Final  Culture, blood (routine x 2)     Status: None   Collection Time: 07/15/19  7:01 AM   Specimen: BLOOD RIGHT ARM  Result Value Ref Range Status   Specimen Description BLOOD RIGHT ARM  Final   Special Requests   Final    BOTTLES DRAWN AEROBIC ONLY Blood Culture adequate volume   Culture   Final    NO GROWTH 5 DAYS Performed at [facility], [place] [address] [place], [place] [address]    Report Status 07/20/2019 FINAL  Final  SARS CORONAVIRUS 2 (TAT 6-24 HRS) Nasopharyngeal Nasopharyngeal Swab     Status: None   Collection Time: 07/15/19  9:13 AM   Specimen: Nasopharyngeal Swab  Result Value Ref Range Status   SARS Coronavirus 2 NEGATIVE NEGATIVE Final    Comment: (NOTE) SARS-CoV-2 target nucleic acids are NOT DETECTED. The SARS-CoV-2 RNA is generally detectable in upper and lower respiratory specimens during the acute phase of infection. Negative results do not preclude SARS-CoV-2 infection, do not rule out co-infections with other pathogens, and should not be used as the sole basis for treatment or other patient management decisions. Negative results must be combined with clinical observations, patient history, and epidemiological information. The expected result is Negative. Fact Sheet for Patients: SugarRoll.be Fact Sheet for Healthcare Providers: https://www.woods-mathews.com/ This test is not yet approved or cleared by the [place] FDA and  has been authorized for detection and/or diagnosis of SARS-CoV-2 by FDA under an Emergency Use Authorization (EUA). This EUA will remain  in effect (meaning this test can be used) for the duration of the COVID-19 declaration under Section 56 4(b)(1) of the Act, 21 U.S.C. section 360bbb-3(b)(1), unless the authorization is terminated or revoked sooner. Performed at  [facility], [place] [address] [place], [place] [address]   MRSA PCR Screening     Status: None   Collection Time: 07/16/19 12:05 PM   Specimen: Nasal Mucosa; Nasopharyngeal  Result Value Ref Range Status   MRSA by PCR NEGATIVE NEGATIVE Final    Comment:        The GeneXpert MRSA Assay (FDA approved for NASAL specimens only), is one component of a comprehensive MRSA colonization surveillance program. It is not intended to diagnose MRSA infection nor to guide or monitor treatment for MRSA infections. Performed at [facility], [place] [address], [place], [place] [address]      Labs: BNP (last 3 results) Recent Labs    06/19/19 1515 07/15/19 0652  BNP 756.3* [id number]   Basic Metabolic Panel: Recent Labs  Lab 07/17/19 [id number] 07/17/19 [id number] 07/18/19 0320 07/19/19 0337 07/20/19 0256 07/20/19 1828 07/21/19 0353  NA 142  --  146* 148* 146*  --  147*  K 3.3*   < > 3.6 3.4* 3.2* 4.2 4.0  CL 96*  --  100 102 99  --  104  CO2 34*  --  34* 36* 37*  --  33*  GLUCOSE 176*  --  168* 156* 107*  --  146*  BUN 34*  --  33* 32* 35*  --  34*  CREATININE 1.41*  --  1.26* 1.06 1.20  --  1.22  CALCIUM 8.5*  --  8.3* 8.6* 8.5*  --  8.5*  MG 2.5*  --  2.4  --  2.3  --   --   PHOS 3.6  --   --   --   --   --   --    < > = values in this interval not displayed.   Liver Function Tests: Recent Labs  Lab 07/17/19 0812 07/18/19 0320 07/19/19 0337 07/20/19 0256 07/21/19 0353  AST 104* 88* 95* 177* 200*  ALT 155* 144* 155* 220* 283*  ALKPHOS 129* 121 125 109 113  BILITOT 0.9 0.6 0.5 0.4 0.5  PROT 6.1* 6.1* 6.1* 5.9* 5.6*  ALBUMIN 2.5* 2.5* 2.4* 2.3* 2.3*   No results for input(s): LIPASE, AMYLASE in the last 168 hours. Recent Labs  Lab 07/16/19 0941  AMMONIA 25   CBC: Recent Labs  Lab 07/15/19 0652 07/16/19 0253 07/17/19 0812 07/18/19 0320 07/19/19 0337 07/20/19 0256 07/21/19 0353  WBC 12.9*   < > 14.0* 13.5* 11.6* 13.3* 11.5*  NEUTROABS 9.7*  --  10.0*  --   --    --   --   HGB 15.6   < > 13.8 13.6 13.9 12.9* 12.3*  HCT 48.2   < > 42.8 43.4 44.8 42.0 39.5  MCV 96.2   < > 94.5 97.3 99.6 100.2* 98.5  PLT 238   < > 321 331 321 305 284   < > = values in this interval not displayed.   Cardiac Enzymes: No results for input(s): CKTOTAL, CKMB, CKMBINDEX, TROPONINI in the last 168 hours. BNP: Invalid input(s): POCBNP CBG: Recent Labs  Lab 07/20/19 1139 07/20/19 1928 07/20/19 2333 07/21/19 0356 07/21/19 0752  GLUCAP 120* 99 118* 131* 139*   D-Dimer No results for input(s): DDIMER in the last 72 hours. Hgb A1c No results for input(s): HGBA1C in the last 72 hours. Lipid Profile No results for input(s): CHOL, HDL, LDLCALC, TRIG, CHOLHDL, LDLDIRECT in the last 72 hours. Thyroid function studies No results for input(s): TSH, T4TOTAL, T3FREE, THYROIDAB in the last 72 hours.  Invalid input(s): FREET3 Anemia work up No results for input(s): VITAMINB12, FOLATE, FERRITIN, TIBC, IRON, RETICCTPCT in the last 72 hours. Urinalysis    Component Value Date/Time   COLORURINE YELLOW 07/07/2019 1700   APPEARANCEUR CLEAR 07/07/2019 1700   LABSPEC 1.005 07/07/2019 1700   PHURINE 6.0 07/07/2019 1700   GLUCOSEU NEGATIVE 07/07/2019 1700   HGBUR NEGATIVE 07/07/2019 1700   BILIRUBINUR NEGATIVE 07/07/2019 1700   KETONESUR NEGATIVE 07/07/2019 1700   PROTEINUR NEGATIVE 07/07/2019 1700   NITRITE NEGATIVE 07/07/2019 1700   LEUKOCYTESUR NEGATIVE 07/07/2019 1700   Sepsis Labs Invalid input(s): PROCALCITONIN,  WBC,  LACTICIDVEN Microbiology Recent Results (from the past 240 hour(s))  Culture, blood (routine x 2)     Status: None   Collection Time: 07/15/19  6:52 AM   Specimen: BLOOD  Result Value Ref Range Status   Specimen Description BLOOD RIGHT ANTECUBITAL  Final   Special Requests   Final    BOTTLES DRAWN AEROBIC ONLY Blood Culture results may not be optimal due to an inadequate volume of blood received in culture bottles   Culture   Final    NO GROWTH 5  DAYS Performed at [facility], [place] [address], [place], [place] [address]    Report Status 07/20/2019 FINAL  Final  Culture, blood (routine x 2)     Status: None   Collection Time: 07/15/19  7:01 AM   Specimen: BLOOD RIGHT ARM  Result Value Ref Range Status   Specimen Description BLOOD RIGHT ARM  Final   Special  Requests   Final    BOTTLES DRAWN AEROBIC ONLY Blood Culture adequate volume   Culture   Final    NO GROWTH 5 DAYS Performed at [facility], [place] [address], [place], [place] [address]    Report Status 07/20/2019 FINAL  Final  SARS CORONAVIRUS 2 (TAT 6-24 HRS) Nasopharyngeal Nasopharyngeal Swab     Status: None   Collection Time: 07/15/19  9:13 AM   Specimen: Nasopharyngeal Swab  Result Value Ref Range Status   SARS Coronavirus 2 NEGATIVE NEGATIVE Final    Comment: (NOTE) SARS-CoV-2 target nucleic acids are NOT DETECTED. The SARS-CoV-2 RNA is generally detectable in upper and lower respiratory specimens during the acute phase of infection. Negative results do not preclude SARS-CoV-2 infection, do not rule out co-infections with other pathogens, and should not be used as the sole basis for treatment or other patient management decisions. Negative results must be combined with clinical observations, patient history, and epidemiological information. The expected result is Negative. Fact Sheet for Patients: SugarRoll.be Fact Sheet for Healthcare Providers: https://www.woods-mathews.com/ This test is not yet approved or cleared by the [place] FDA and  has been authorized for detection and/or diagnosis of SARS-CoV-2 by FDA under an Emergency Use Authorization (EUA). This EUA will remain  in effect (meaning this test can be used) for the duration of the COVID-19 declaration under Section 56 4(b)(1) of the Act, 21 U.S.C. section 360bbb-3(b)(1), unless the authorization is terminated or revoked sooner. Performed at  [facility], [place] [address], [place], [place] [address]   MRSA PCR Screening     Status: None   Collection Time: 07/16/19 12:05 PM   Specimen: Nasal Mucosa; Nasopharyngeal  Result Value Ref Range Status   MRSA by PCR NEGATIVE NEGATIVE Final    Comment:        The GeneXpert MRSA Assay (FDA approved for NASAL specimens only), is one component of a comprehensive MRSA colonization surveillance program. It is not intended to diagnose MRSA infection nor to guide or monitor treatment for MRSA infections. Performed at [facility], [place] [address] [place], [place] [address]      Time coordinating discharge: 35 minutes  SIGNED:   Aline August, MD  [facility] 07/21/2019, 11:17 AM

## 2019-07-21 NOTE — Progress Notes (Signed)
RT called to bedside due to BIPAP alarming. Patient had pulled the BIPAP mask off, which he has done a few times within the hour, when RT arrived to patients room. RT placed patient back on 4L nasal cannula since patient continues to remove BIPAP and mittens cannot be worn with BIPAP. Patient is tolerating nasal cannula well with stable vitals. RT will continue to monitor patient and will reattempt BIPAP if needed.

## 2019-07-21 NOTE — Care Management Important Message (Signed)
Important Message  Patient Details  Name: Levi Pruitt MRN: [id number] Date of Birth: [birth year]   Medicare Important Message Given:  Yes     Shelda Altes 07/21/2019, 1:21 PM

## 2019-07-22 ENCOUNTER — Inpatient Hospital Stay (HOSPITAL_COMMUNITY): Payer: Medicare Other | Admitting: Physical Therapy

## 2019-07-22 ENCOUNTER — Inpatient Hospital Stay (HOSPITAL_COMMUNITY): Payer: Medicare Other | Admitting: Speech Pathology

## 2019-07-22 ENCOUNTER — Inpatient Hospital Stay (HOSPITAL_COMMUNITY): Payer: Medicare Other

## 2019-07-22 ENCOUNTER — Inpatient Hospital Stay (HOSPITAL_COMMUNITY)
Admission: AD | Admit: 2019-07-22 | Discharge: 2019-08-09 | DRG: 177 | Disposition: E | Payer: Medicare Other | Source: Ambulatory Visit | Attending: Internal Medicine | Admitting: Internal Medicine

## 2019-07-22 ENCOUNTER — Inpatient Hospital Stay (HOSPITAL_COMMUNITY): Payer: Medicare Other | Admitting: Occupational Therapy

## 2019-07-22 DIAGNOSIS — R451 Restlessness and agitation: Secondary | ICD-10-CM | POA: Diagnosis not present

## 2019-07-22 DIAGNOSIS — J9602 Acute respiratory failure with hypercapnia: Secondary | ICD-10-CM | POA: Diagnosis not present

## 2019-07-22 DIAGNOSIS — Z7189 Other specified counseling: Secondary | ICD-10-CM | POA: Diagnosis not present

## 2019-07-22 DIAGNOSIS — F05 Delirium due to known physiological condition: Secondary | ICD-10-CM | POA: Diagnosis present

## 2019-07-22 DIAGNOSIS — I69391 Dysphagia following cerebral infarction: Secondary | ICD-10-CM | POA: Diagnosis not present

## 2019-07-22 DIAGNOSIS — J690 Pneumonitis due to inhalation of food and vomit: Secondary | ICD-10-CM | POA: Diagnosis present

## 2019-07-22 DIAGNOSIS — Z66 Do not resuscitate: Secondary | ICD-10-CM | POA: Diagnosis not present

## 2019-07-22 DIAGNOSIS — J9621 Acute and chronic respiratory failure with hypoxia: Secondary | ICD-10-CM | POA: Diagnosis present

## 2019-07-22 DIAGNOSIS — I480 Paroxysmal atrial fibrillation: Secondary | ICD-10-CM | POA: Diagnosis present

## 2019-07-22 DIAGNOSIS — Z515 Encounter for palliative care: Secondary | ICD-10-CM | POA: Diagnosis not present

## 2019-07-22 DIAGNOSIS — I509 Heart failure, unspecified: Secondary | ICD-10-CM

## 2019-07-22 DIAGNOSIS — Z79899 Other long term (current) drug therapy: Secondary | ICD-10-CM

## 2019-07-22 DIAGNOSIS — I82611 Acute embolism and thrombosis of superficial veins of right upper extremity: Secondary | ICD-10-CM | POA: Diagnosis present

## 2019-07-22 DIAGNOSIS — J9622 Acute and chronic respiratory failure with hypercapnia: Secondary | ICD-10-CM | POA: Diagnosis present

## 2019-07-22 DIAGNOSIS — D490 Neoplasm of unspecified behavior of digestive system: Secondary | ICD-10-CM | POA: Diagnosis present

## 2019-07-22 DIAGNOSIS — I69351 Hemiplegia and hemiparesis following cerebral infarction affecting right dominant side: Secondary | ICD-10-CM | POA: Diagnosis not present

## 2019-07-22 DIAGNOSIS — R1310 Dysphagia, unspecified: Secondary | ICD-10-CM | POA: Diagnosis present

## 2019-07-22 DIAGNOSIS — I6930 Unspecified sequelae of cerebral infarction: Secondary | ICD-10-CM

## 2019-07-22 DIAGNOSIS — J9601 Acute respiratory failure with hypoxia: Secondary | ICD-10-CM

## 2019-07-22 DIAGNOSIS — N179 Acute kidney failure, unspecified: Secondary | ICD-10-CM | POA: Diagnosis present

## 2019-07-22 DIAGNOSIS — R7401 Elevation of levels of liver transaminase levels: Secondary | ICD-10-CM | POA: Diagnosis present

## 2019-07-22 DIAGNOSIS — D72829 Elevated white blood cell count, unspecified: Secondary | ICD-10-CM | POA: Diagnosis present

## 2019-07-22 DIAGNOSIS — I63532 Cerebral infarction due to unspecified occlusion or stenosis of left posterior cerebral artery: Secondary | ICD-10-CM

## 2019-07-22 DIAGNOSIS — I214 Non-ST elevation (NSTEMI) myocardial infarction: Secondary | ICD-10-CM | POA: Diagnosis not present

## 2019-07-22 DIAGNOSIS — R0600 Dyspnea, unspecified: Secondary | ICD-10-CM

## 2019-07-22 DIAGNOSIS — R748 Abnormal levels of other serum enzymes: Secondary | ICD-10-CM | POA: Diagnosis present

## 2019-07-22 DIAGNOSIS — Z931 Gastrostomy status: Secondary | ICD-10-CM

## 2019-07-22 DIAGNOSIS — Z8249 Family history of ischemic heart disease and other diseases of the circulatory system: Secondary | ICD-10-CM | POA: Diagnosis not present

## 2019-07-22 DIAGNOSIS — I5043 Acute on chronic combined systolic (congestive) and diastolic (congestive) heart failure: Secondary | ICD-10-CM | POA: Diagnosis present

## 2019-07-22 DIAGNOSIS — I668 Occlusion and stenosis of other cerebral arteries: Secondary | ICD-10-CM | POA: Diagnosis present

## 2019-07-22 DIAGNOSIS — E119 Type 2 diabetes mellitus without complications: Secondary | ICD-10-CM | POA: Diagnosis present

## 2019-07-22 DIAGNOSIS — Z20822 Contact with and (suspected) exposure to covid-19: Secondary | ICD-10-CM | POA: Diagnosis present

## 2019-07-22 DIAGNOSIS — Z7902 Long term (current) use of antithrombotics/antiplatelets: Secondary | ICD-10-CM

## 2019-07-22 DIAGNOSIS — Z7982 Long term (current) use of aspirin: Secondary | ICD-10-CM

## 2019-07-22 DIAGNOSIS — I252 Old myocardial infarction: Secondary | ICD-10-CM

## 2019-07-22 DIAGNOSIS — Z789 Other specified health status: Secondary | ICD-10-CM

## 2019-07-22 DIAGNOSIS — J9692 Respiratory failure, unspecified with hypercapnia: Secondary | ICD-10-CM

## 2019-07-22 DIAGNOSIS — R0609 Other forms of dyspnea: Secondary | ICD-10-CM | POA: Diagnosis not present

## 2019-07-22 DIAGNOSIS — Z794 Long term (current) use of insulin: Secondary | ICD-10-CM

## 2019-07-22 LAB — CBC WITH DIFFERENTIAL/PLATELET
Abs Immature Granulocytes: 0.13 10*3/uL — ABNORMAL HIGH (ref 0.00–0.07)
Basophils Absolute: 0.1 10*3/uL (ref 0.0–0.1)
Basophils Relative: 1 %
Eosinophils Absolute: 0.3 10*3/uL (ref 0.0–0.5)
Eosinophils Relative: 2 %
HCT: 39.1 % (ref 39.0–52.0)
Hemoglobin: 12.1 g/dL — ABNORMAL LOW (ref 13.0–17.0)
Immature Granulocytes: 1 %
Lymphocytes Relative: 10 %
Lymphs Abs: 1.4 10*3/uL (ref 0.7–4.0)
MCH: 30.6 pg (ref 26.0–34.0)
MCHC: 30.9 g/dL (ref 30.0–36.0)
MCV: 99 fL (ref 80.0–100.0)
Monocytes Absolute: 1.5 10*3/uL — ABNORMAL HIGH (ref 0.1–1.0)
Monocytes Relative: 10 %
Neutro Abs: 10.8 10*3/uL — ABNORMAL HIGH (ref 1.7–7.7)
Neutrophils Relative %: 76 %
Platelets: 310 10*3/uL (ref 150–400)
RBC: 3.95 MIL/uL — ABNORMAL LOW (ref 4.22–5.81)
RDW: 13.9 % (ref 11.5–15.5)
WBC: 14.1 10*3/uL — ABNORMAL HIGH (ref 4.0–10.5)
nRBC: 0 % (ref 0.0–0.2)

## 2019-07-22 LAB — BLOOD GAS, ARTERIAL
Acid-Base Excess: 10.8 mmol/L — ABNORMAL HIGH (ref 0.0–2.0)
Bicarbonate: 35.7 mmol/L — ABNORMAL HIGH (ref 20.0–28.0)
FIO2: 50
O2 Saturation: 97.4 %
Patient temperature: 37.6
pCO2 arterial: 57.7 mmHg — ABNORMAL HIGH (ref 32.0–48.0)
pH, Arterial: 7.411 (ref 7.350–7.450)
pO2, Arterial: 108 mmHg (ref 83.0–108.0)

## 2019-07-22 LAB — COMPREHENSIVE METABOLIC PANEL
ALT: 267 U/L — ABNORMAL HIGH (ref 0–44)
AST: 153 U/L — ABNORMAL HIGH (ref 15–41)
Albumin: 2.4 g/dL — ABNORMAL LOW (ref 3.5–5.0)
Alkaline Phosphatase: 115 U/L (ref 38–126)
Anion gap: 15 (ref 5–15)
BUN: 32 mg/dL — ABNORMAL HIGH (ref 8–23)
CO2: 33 mmol/L — ABNORMAL HIGH (ref 22–32)
Calcium: 8.8 mg/dL — ABNORMAL LOW (ref 8.9–10.3)
Chloride: 96 mmol/L — ABNORMAL LOW (ref 98–111)
Creatinine, Ser: 1.28 mg/dL — ABNORMAL HIGH (ref 0.61–1.24)
GFR calc Af Amer: >60 mL/min (ref >60)
GFR calc non Af Amer: 55 mL/min — ABNORMAL LOW (ref >60)
Glucose, Bld: 136 mg/dL — ABNORMAL HIGH (ref 70–99)
Potassium: 3.8 mmol/L (ref 3.5–5.1)
Sodium: 144 mmol/L (ref 135–145)
Total Bilirubin: 0.7 mg/dL (ref 0.3–1.2)
Total Protein: 5.9 g/dL — ABNORMAL LOW (ref 6.5–8.1)

## 2019-07-22 LAB — PROCALCITONIN: Procalcitonin: 0.82 ng/mL

## 2019-07-22 LAB — GLUCOSE, CAPILLARY
Glucose-Capillary: 106 mg/dL — ABNORMAL HIGH (ref 70–99)
Glucose-Capillary: 109 mg/dL — ABNORMAL HIGH (ref 70–99)
Glucose-Capillary: 119 mg/dL — ABNORMAL HIGH (ref 70–99)
Glucose-Capillary: 120 mg/dL — ABNORMAL HIGH (ref 70–99)
Glucose-Capillary: 134 mg/dL — ABNORMAL HIGH (ref 70–99)
Glucose-Capillary: 77 mg/dL (ref 70–99)
Glucose-Capillary: 85 mg/dL (ref 70–99)

## 2019-07-22 LAB — BRAIN NATRIURETIC PEPTIDE: B Natriuretic Peptide: 990.5 pg/mL — ABNORMAL HIGH (ref 0.0–100.0)

## 2019-07-22 MED ORDER — ONDANSETRON HCL 4 MG/2ML IJ SOLN: 4.0000 mg | Freq: Four times a day (QID) | INTRAMUSCULAR | Status: DC | PRN

## 2019-07-22 MED ORDER — ATORVASTATIN CALCIUM 80 MG PO TABS
80.0000 mg | ORAL_TABLET | Freq: Every day | ORAL | Status: DC
  Administered 2019-07-22 – 2019-07-27 (×6): 80 mg
  Filled 2019-07-22 (×6): qty 1

## 2019-07-22 MED ORDER — FUROSEMIDE 10 MG/ML IJ SOLN
20.0000 mg | Freq: Two times a day (BID) | INTRAMUSCULAR | Status: DC
  Administered 2019-07-22 – 2019-07-28 (×12): 20 mg via INTRAVENOUS
  Filled 2019-07-22 (×12): qty 2

## 2019-07-22 MED ORDER — FUROSEMIDE 20 MG PO TABS: 20.0000 mg | ORAL_TABLET | Freq: Every day | ORAL | Status: DC

## 2019-07-22 MED ORDER — SODIUM CHLORIDE 0.9 % IV SOLN
2.0000 g | INTRAVENOUS | Status: DC
  Administered 2019-07-22 – 2019-07-27 (×6): 2 g via INTRAVENOUS
  Filled 2019-07-22 (×3): qty 20
  Filled 2019-07-22: qty 2
  Filled 2019-07-22: qty 20
  Filled 2019-07-22 (×2): qty 2

## 2019-07-22 MED ORDER — FREE WATER
200.0000 mL | Freq: Four times a day (QID) | Status: DC
  Administered 2019-07-22 – 2019-07-23 (×4): 200 mL

## 2019-07-22 MED ORDER — AMIODARONE HCL 200 MG PO TABS
200.0000 mg | ORAL_TABLET | Freq: Two times a day (BID) | ORAL | Status: DC
  Administered 2019-07-22 – 2019-07-26 (×8): 200 mg
  Filled 2019-07-22 (×8): qty 1

## 2019-07-22 MED ORDER — ACETAMINOPHEN 325 MG PO TABS: 650.0000 mg | ORAL_TABLET | Freq: Four times a day (QID) | ORAL | Status: DC | PRN

## 2019-07-22 MED ORDER — CLOPIDOGREL BISULFATE 75 MG PO TABS
75.0000 mg | ORAL_TABLET | Freq: Every day | ORAL | Status: DC
  Administered 2019-07-23 – 2019-07-27 (×5): 75 mg
  Filled 2019-07-22 (×5): qty 1

## 2019-07-22 MED ORDER — ENOXAPARIN SODIUM 40 MG/0.4ML ~~LOC~~ SOLN
40.0000 mg | SUBCUTANEOUS | Status: DC
  Administered 2019-07-22 – 2019-07-27 (×6): 40 mg via SUBCUTANEOUS
  Filled 2019-07-22 (×6): qty 0.4

## 2019-07-22 MED ORDER — METHYLPHENIDATE HCL 5 MG PO TABS
5.0000 mg | ORAL_TABLET | Freq: Two times a day (BID) | ORAL | Status: DC
  Administered 2019-07-22 – 2019-07-27 (×11): 5 mg via ORAL
  Filled 2019-07-22 (×12): qty 1

## 2019-07-22 MED ORDER — JEVITY 1.5 CAL/FIBER PO LIQD
1000.0000 mL | ORAL | Status: DC
  Administered 2019-07-22 – 2019-07-23 (×2): 1000 mL via ORAL
  Filled 2019-07-22: qty 1000

## 2019-07-22 MED ORDER — ONDANSETRON HCL 4 MG PO TABS: 4.0000 mg | ORAL_TABLET | Freq: Four times a day (QID) | ORAL | Status: DC | PRN

## 2019-07-22 MED ORDER — ASPIRIN 81 MG PO CHEW
324.0000 mg | CHEWABLE_TABLET | Freq: Every day | ORAL | Status: DC
  Administered 2019-07-23 – 2019-07-27 (×5): 324 mg
  Filled 2019-07-22 (×6): qty 4

## 2019-07-22 MED ORDER — INSULIN ASPART 100 UNIT/ML ~~LOC~~ SOLN: 0.0000 [IU] | SUBCUTANEOUS | Status: DC

## 2019-07-22 MED ORDER — IPRATROPIUM-ALBUTEROL 0.5-2.5 (3) MG/3ML IN SOLN: 3.0000 mL | Freq: Four times a day (QID) | RESPIRATORY_TRACT | Status: DC | PRN

## 2019-07-22 MED ORDER — ACETAMINOPHEN 650 MG RE SUPP: 650.0000 mg | Freq: Four times a day (QID) | RECTAL | Status: DC | PRN

## 2019-07-22 MED ORDER — SODIUM CHLORIDE 0.9 % IV SOLN
500.0000 mg | INTRAVENOUS | Status: DC
  Administered 2019-07-22 – 2019-07-27 (×6): 500 mg via INTRAVENOUS
  Filled 2019-07-22 (×7): qty 500

## 2019-07-22 MED ORDER — SODIUM CHLORIDE 0.9% FLUSH
3.0000 mL | Freq: Two times a day (BID) | INTRAVENOUS | Status: DC
  Administered 2019-07-22: 10 mL via INTRAVENOUS
  Administered 2019-07-22 – 2019-07-28 (×13): 3 mL via INTRAVENOUS

## 2019-07-22 NOTE — Discharge Summary (Signed)
Physician Discharge Summary  Patient ID: Levi Pruitt MRN: [id number] DOB/AGE: 74-19-1947 74 y.o.  Admit date: 07/21/2019 Discharge date: 07/12/2019  Discharge Diagnoses:  Principal Problem:   Respiratory failure, unspecified with hypercapnia ([place]) Active Problems:   AKI (acute kidney injury) ([place])   Dysphagia   Dysphagia, post-stroke   Arterial ischemic stroke, PCA (posterior cerebral artery), left, acute ([facility])   Transaminitis   Discharged Condition: Guarded   Significant Diagnostic Studies: N/A   Labs:  Basic Metabolic Panel: BMP Latest Ref Rng & Units 07/21/2019 07/21/2019 07/20/2019  Glucose 70 - 99 mg/dL 136(H) 146(H) -  BUN 8 - 23 mg/dL 32(H) 34(H) -  Creatinine 0.61 - 1.24 mg/dL 1.28(H) 1.22 -  Sodium 135 - 145 mmol/L 144 147(H) -  Potassium 3.5 - 5.1 mmol/L 3.8 4.0 4.2  Chloride 98 - 111 mmol/L 96(L) 104 -  CO2 22 - 32 mmol/L 33(H) 33(H) -  Calcium 8.9 - 10.3 mg/dL 8.8(L) 8.5(L) -    CBC: Recent Labs  Lab 07/17/19 0812 07/18/19 0320 07/20/19 0256 07/21/19 0353 08/04/2019 0614  WBC 14.0*   < > 13.3* 11.5* 14.1*  NEUTROABS 10.0*  --   --   --  10.8*  HGB 13.8   < > 12.9* 12.3* 12.1*  HCT 42.8   < > 42.0 39.5 39.1  MCV 94.5   < > 100.2* 98.5 99.0  PLT 321   < > 305 284 310   < > = values in this interval not displayed.    CBG: Recent Labs  Lab 07/21/19 1624 07/21/19 1959 07/21/19 2353 08/04/2019 0358 08/06/2019 0824  GLUCAP 123* 107* 156* 119* 134*    Brief HPI:   Levi Pruitt is a 73 y.o. male Levi Pruitt is a 74 year old male without medical care for 40+years but in relatively good health who was admitted on 06/18/2019.  History taken from chart review due to aphasia. He was found down with right facial droop, right hemiparesis and dysarthria. Work up revealed acute large right PCA infarct, small left parietal infarct. Echo showed cardiomyopathy with EF of 25-30%. He was found to have NSTEMI. Hospital course significant for recurrent episodes of aspiration PNA  with difficulty handling secretions as well as bouts of lethargy. PEG placed for nutritional support on 06/22/2019 by radiology and cardiology recommended medical management as patient not candidate for intervention to work up inferior wall NSTEMI.  Family elected on full scope of care and CIR recommended to reduce burden of care. Patient with resultant dense right hemiplegia, right inattention, oral apraxia, cognitive deficits with ongoing bouts of lethargy.    He was admitted to CIR on 07/07/2019 for intensive rehab program. He was found to have sleep wake disruption affecting, SOB felt to be due to fluid overload as well as mentation. He later developed A. Fib with RVR with hypotension on 07/15/2019.  He was transferred to acute hospital for management. He was started on IV amiodarone for rate control and IV antibiotics added due to sepsis due to recurrent aspiration PNA.  Acute on chronic combined CHF treated with IV diuresis and he required BIPAP due to acute respiratory failure. He has completed 5 day course IV antibiotics and weaned to 4L oxygen during the day with BIPAP at nights. Heart rate controlled on amiodarone bid X 1 week--.taper to 200 mg daily X 1 week then d/c. He continued to have waxing and waning of MS and follow up CT head 2/8 showed large subacute L-PCA infarct with petechial hemorrhage  and/or cortical lamina necrosis within portions of paramedian left parietoccipital infarct and small subacute cortical infarct in left frontal lobe.  RUE doppler done due to edema and revealed acute thrombus in cephalic vein at antecubital site likely due to DVT--to monitor. To continue DAPT. Cardiology recommended palliative consult for [place] and family elected on "NO intubation.   Respiratory status was improving with use of BIPAP at nights for hypercarbic respiratory failure. He was medically optimized and cleared to resume his rehab course. He was admitted to CIR on 07/20/18 and reported to have a reasonable  night. On 2/11 am he developed increased WOB with respiratory distress after being taken off BIPAP. He was placed back on machine. Question respiratory distress due to CHF v/s new aspiration event. ABG and CXR ordered for work up revealing hypercarbia as well as slight increase in LUL infiltrate with persistent bilateral infiltrates concerning for multifocal PNA. Cardiology and Hospitalist were contacted to transfer patient back to acute floor for management.    Medications at discharge:  . amiodarone  200 mg Oral BID  . aspirin  324 mg Per Tube Daily  . atorvastatin  80 mg Per Tube q1800  . clopidogrel  75 mg Per Tube Daily  . enoxaparin (LOVENOX) injection  40 mg Subcutaneous Q24H  . feeding supplement (PRO-STAT SUGAR FREE 64)  30 mL Oral BID  . free water  200 mL Per Tube Q6H  . furosemide  20 mg Oral Daily  . insulin aspart  0-9 Units Subcutaneous Q4H  . mouth rinse  15 mL Mouth Rinse q12n4p  . methylphenidate  5 mg Per Tube BID WC    Special instructions: Continue BIPAP. Follow up on ABG results.   Diet: NPO   Signed: Bary Leriche 07/26/2019, 9:36 AM

## 2019-07-22 NOTE — Evaluation (Signed)
Physical Therapy Assessment and Plan  Patient Details  Name: Levi Pruitt MRN: [id number] Date of Birth: 26-Jun-1945  PT Diagnosis: Abnormal posture, Abnormality of gait, Difficulty walking, Edema, Hemiplegia dominant, Hypotonia, Impaired cognition, Impaired sensation and Muscle weakness Rehab Potential: Poor ELOS: ~4 weeks   Today's Date: 07/25/2019 PT Individual Time: 0815-0900 PT Individual Time Calculation (min): 45 min   and  Today's Date: 07/21/2019 PT Missed Time: 15 Minutes Missed Time Reason: Other (Comment)(Discussion with PA - details below)   Problem List:  Patient Active Problem List   Diagnosis Date Noted  . Transaminitis   . Atrial fibrillation with rapid ventricular response ([place])   . Supplemental oxygen dependent   . Adult failure to thrive   . At high risk for aspiration   . Acute systolic CHF (congestive heart failure) ([place]) 07/15/2019  . Arterial ischemic stroke, PCA (posterior cerebral artery), left, acute ([place]) 07/07/2019  . CHF (congestive heart failure) ([place])   . Dysphagia, post-stroke   . Stenosis of right carotid artery   . Acute respiratory failure with hypoxia ([place])   . Urinary retention   . Weakness generalized   . Palliative care by specialist   . DNR (do not resuscitate) discussion   . Dysphagia   . ST elevation myocardial infarction (STEMI) ([place])   . LV dysfunction   . 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 Abnormal ECG 06/18/2019  . Acute ischemic stroke ([place]) 06/18/2019  . Parotid mass 06/18/2019  . AKI (acute kidney injury) ([place]) 06/18/2019  . Acute ST elevation myocardial infarction (STEMI) of inferior wall [facility])     Past Medical History:  Past Medical History:  Diagnosis Date  . 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 IR GASTROSTOMY TUBE MOD SED  07/06/2019    Assessment & Plan Clinical  Impression: Patient is a 74 y.o. year old male without medical care for 40+years but in relatively good health who was admitted on 06/18/2019.  History taken from chart review due to aphasia. He was found down with right facial droop, right hemiparesis and dysarthria. Work up revealed acute large right PCA infarct, small left parietal infarct. Echo showed cardiomyopathy with EF of 25-30%. He was found to have NSTEMI. Hospital course significant for recurrent episodes of aspiration PNA with difficulty handling secretions as well as bouts of lethargy. PEG placed for nutritional support on 06/22/2019 by radiology and cardiology recommended medical management as patient not candidate for intervention to work up inferior wall NSTEMI.  Family elected on full scope of care and CIR recommended to reduce burden of care. Patient with resultant dense right hemiplegia, right inattention, oral apraxia, cognitive deficits with ongoing bouts of lethargy.   He was admitted to CIR on 07/07/2019 for intensive rehab program. He was found to have sleep wake disruption affecting, SOB felt to be due to fluid overload as well as mentation. He later developed A. Fib with RVR with hypotension on 07/15/2019.  He was transferred to acute hospital for management. He was started on IV amiodarone for rate control and IV antibiotics added due to sepsis due to recurrent aspiration PNA.  Acute on chronic combined CHF treated with IV diuresis and he required BIPAP due to acute respiratory failure. He has completed 5 day course IV antibiotics and weaned to 4L oxygen during the day with BIPAP at nights. Heart rate controlled on amiodarone  bid X 1 week--.taper to 200 mg daily X 1 week then d/c. He continued to have waxing and waning of MS and follow up CT head 2/8 showed large subacute L-PCA infarct with petechial hemorrhage and/or cortical lamina necrosis within portions of paramedian left parietoccipital infarct and small subacute cortical infarct in  left frontal lobe.  RUE doppler done due to edema and revealed acute thrombus in cephalic vein at antecubital site likely due to DVT--to monitor. To continue DAPT. Cardiology recommended palliative consult for [name] and family elected on "NO intubation".  Patient transferred to CIR on 07/21/2019 .   Patient currently requires total with mobility secondary to muscle weakness, muscle joint tightness and muscle paralysis, decreased cardiorespiratoy endurance and decreased oxygen support, impaired timing and sequencing, abnormal tone, unbalanced muscle activation, motor apraxia, decreased coordination and decreased motor planning, decreased midline orientation, decreased attention to right and ideational apraxia, decreased initiation, decreased attention, decreased awareness, decreased problem solving, decreased safety awareness, decreased memory and delayed processing and decreased sitting balance, decreased standing balance, decreased postural control and decreased balance strategies.  Prior to hospitalization, patient was independent  with mobility and lived with Alone in a House home.  Home access is couple Stairs to enter.  Patient will benefit from skilled PT intervention to maximize safe functional mobility, minimize fall risk and decrease caregiver burden for planned discharge home with 24 hour assist.  Anticipate patient will benefit from follow up [facility] at discharge.  PT - End of Session Activity Tolerance: Tolerates 30+ min activity with multiple rests Endurance Deficit: Yes Endurance Deficit Description: Pt very lethargic and only able to tolerate rolling R/L in bed PT Assessment Rehab Potential (ACUTE/IP ONLY): Poor PT Barriers to Discharge: [place] home environment;Home environment access/layout;Decreased caregiver support;Medical stability;Incontinence;Lack of/limited family support;Neurogenic Bowel & Bladder;Nutrition means;New oxygen PT Patient demonstrates impairments in the following  area(s): Balance;Behavior;Perception;Safety;Edema;Sensory;Endurance;Skin Integrity;Motor;Nutrition;Pain PT Transfers Functional Problem(s): Bed Mobility;Bed to Chair;Car PT Locomotion Functional Problem(s): Ambulation;Wheelchair Mobility PT Plan PT Intensity: Minimum of 1-2 x/day ,45 to 90 minutes PT Frequency: Total of 15 hours over 7 days of combined therapies PT Duration Estimated Length of Stay: ~4 weeks PT Treatment/Interventions: Ambulation/gait training;Community reintegration;DME/adaptive equipment instruction;Neuromuscular re-education;Psychosocial support;Stair training;UE/LE Strength taining/ROM;Wheelchair propulsion/positioning;Balance/vestibular training;Discharge planning;Functional electrical stimulation;Pain management;Skin care/wound management;Therapeutic Activities;UE/LE Coordination activities;Cognitive remediation/compensation;Disease management/prevention;Functional mobility training;Patient/family education;Therapeutic Exercise;Splinting/orthotics;Visual/perceptual remediation/compensation PT Transfers Anticipated Outcome(s): mod assist bed<>chair PT Locomotion Anticipated Outcome(s): Do not anticipate pt will be a functional ambulator PT Recommendation Follow Up Recommendations: Home health PT;24 hour supervision/assistance Patient destination: Home Equipment Recommended: To be determined  Skilled Therapeutic Intervention Evaluation completed (see details above and below) with education on PT POC and goals and individual treatment initiated with focus on activity tolerance and bed mobility. Upon arrival to patient's room he was still on BIPAP machine - spoke with Lonzo Cloud, RN who said patient needed to remain on BIPAP at this time. Therapist exited room to discuss this further with Pam, PA - she cleared patient to participate in bed level therapies while on BIPAP and to monitor vital signs closely - missed 15 minutes of skilled physical therapy to have these discussions to ensure  medically safe for therapy. Returned to room and pt still supine in bed with BIPAP in place and on continuous HR and SpO2 monitor. Pt able to open eyes and minimally respond to therapist's questions (1-2 word responses) and agreeable to therapy session. Pt able to stay awake to complete mobility task at hand but then quick to return to sleep once resting. Pt's  SpO2 >95% throughout session and HR maintained ~65bpm. In supine BP 109/66 (MAP 79) therefore started with rolling R/L in bed with +2 max assist and pt noted to have been incontinent of bowels. Performed total assist LB clothing management and peri-care. Pt able to initiate rolling R via hand-over-hand guidance of L UE and minimal movement in L LE to assist with roll - no active movement noted in R hemibody when rolling L despite therapist manually facilitating positioning of R hemibody. Of note: pt's R UE with significant edema. Once in supine pt falling asleep therefore pt therapeutically positioned with HOB elevated >30degrees and pillows placed for pressure relief and R UE elevation. Re-assessed BP 108/61 (MAP 75). Respiratory therapist arriving to assume care of patient. Therapist notified Dr. Letta Pate of pt's vitals and events of therapy session.   Addendum: Following therapy evaluation patient continued to decline medically and was transferred back to acute services. Will continue to follow patient upon his return to CIR as appropriate.   PT Evaluation Precautions/Restrictions Precautions Precautions: Fall;Other (comment) Precaution Comments: right hemiplegia; right inattention; BIPAP; continuous HR and SpO2 monitor; PEG; R UE DVT Restrictions Weight Bearing Restrictions: No Pain Pain Assessment Pain Scale: Faces Faces Pain Scale: No hurt Home Living/Prior Functioning Home Living Available Help at Discharge: Family;Friend(s);Available PRN/intermittently Type of Home: House Home Access: Stairs to enter [employer] of  Steps: couple  Entrance Stairs-Rails: Right;Left Home Layout: One level  Lives With: Alone Prior Function Level of Independence: Independent with basic ADLs;Independent with homemaking with ambulation;Independent with gait;Independent with transfers  Able to Take Stairs?: Yes Driving: Yes Vocation: Full time employment Comments: prior to hospital admission- independent, driving, working Biochemist, clinical) - all of the above information retrieved from chart review due to pt's cognitive impairments Perception  Perception Perception: Impaired Inattention/Neglect: Does not attend to right visual field;Does not attend to right side of body Praxis Praxis: Impaired Praxis Impairment Details: Motor planning;Ideation;Initiation  Cognition Overall Cognitive Status: Impaired/Different from baseline Arousal/Alertness: Lethargic Orientation Level: Oriented to person(limited ability to respond due to BIPAP) Safety/Judgment: Impaired Comments: Difficult to assess due to lethargy Sensation Sensation Light Touch: Impaired Detail Light Touch Impaired Details: Impaired RLE;Impaired RUE Hot/Cold: Not tested Proprioception: Impaired by gross assessment Proprioception Impaired Details: Impaired RLE;Impaired RUE Stereognosis: Not tested Coordination Gross Motor Movements are Fluid and Coordinated: No Fine Motor Movements are Fluid and Coordinated: No Coordination and Movement Description: No active movement noted in R UE nor R LE Motor  Motor Motor: Hemiplegia;Abnormal postural alignment and control;Abnormal tone Motor - Skilled Clinical Observations: Severe R hemiplegia  Mobility Bed Mobility Bed Mobility: Rolling Right;Rolling Left Rolling Right: 2 Helpers Rolling Left: 2 Helpers Locomotion  Gait Ambulation: No Gait Gait: No Stairs / Additional Locomotion Stairs: No Wheelchair Mobility Wheelchair Mobility: No  Trunk/Postural Assessment  Cervical Assessment Cervical Assessment: Exceptions to  [facility] Thoracic Assessment Thoracic Assessment: Exceptions to WFL(thoracic kyphosis) Lumbar Assessment Lumbar Assessment: Exceptions to [facility] Postural Control Postural Control: Deficits on evaluation(unable to progress to EOB therapy today)  Balance Balance Balance Assessed: No(unable to progress to EOB therapy today) Extremity Assessment      RLE Assessment RLE Assessment: Exceptions to [facility] General Strength Comments: No active movement noted in R LE during rolling tasks and unable to formally assess due to pt lethargy RLE Tone RLE Tone: Hypotonic LLE Assessment LLE Assessment: Exceptions to [facility] General Strength Comments: Grossly 2-/5 throughout during bed mobility but difficult to assess due to pt lethargy    Refer to Care Plan for  Long Term Goals  Recommendations for other services: None   Discharge Criteria: Patient will be discharged from PT if patient refuses treatment 3 consecutive times without medical reason, if treatment goals not met, if there is a change in medical status, if patient makes no progress towards goals or if patient is discharged from hospital.  The above assessment, treatment plan, treatment alternatives and goals were discussed and mutually agreed upon: No family available/patient unable  Tawana Scale, PT, DPT 07/27/2019, 7:50 AM

## 2019-07-22 NOTE — Care Management (Signed)
Patient Details  Name: Levi Pruitt MRN: [id number] Date of Birth: 10/21/1945  Today's Date: 07/31/2019  Levi Pruitt was discharged to acute 07/14/19 for respiratory distress, A-fib /RVR and on 07/19/19 repeat CT due to wax/wane of mental status, found to have Left PCA CVA, petechial hemorrhage, Left parietoccipital infarct, small subacute left frontal infarct. Acute thrombus in cephalic vein (DVT) source?, DAPT continued. Right inattention, Right eye movement impairment, right hemiplegia/tone, right facial droop. Per MD, nothing has changed, continue with previous admission plans. Patient is "DO NOT INTUBATE". See SW/CM A+P below for additional information.  Problem List:  Patient Active Problem List   Diagnosis Date Noted  . Transaminitis   . Atrial fibrillation with rapid ventricular response ([place])   . Supplemental oxygen dependent   . Adult failure to thrive   . At high risk for aspiration   . Acute systolic CHF (congestive heart failure) ([place]) 07/15/2019  . Arterial ischemic stroke, PCA (posterior cerebral artery), left, acute ([place]) 07/07/2019  . CHF (congestive heart failure) ([place])   . Dysphagia, post-stroke   . Stenosis of right carotid artery   . Acute respiratory failure with hypoxia ([place])   . Urinary retention   . Weakness generalized   . Palliative care by specialist   . DNR (do not resuscitate) discussion   . Dysphagia   . ST elevation myocardial infarction (STEMI) ([place])   . LV dysfunction   . 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 Abnormal ECG 06/18/2019  . Acute ischemic stroke ([place]) 06/18/2019  . Parotid mass 06/18/2019  . AKI (acute kidney injury) ([place]) 06/18/2019  . Acute ST elevation myocardial infarction (STEMI) of inferior wall [facility])    Past Medical History:  Past Medical History:  Diagnosis Date  . 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 IR GASTROSTOMY TUBE MOD SED  07/06/2019   Social History:  reports that he has never smoked. He has never used smokeless tobacco. He reports that he does not drink alcohol or use drugs.  Family / Support Systems Patient Roles: Partner Spouse/Significant Other: Friend/POA Levi Pruitt Children: Daughter Mining engineer Anticipated Caregiver: [name] (sig other, works available prn), Amy (dtr, lives out of state, ) Ability/Limitations of Caregiver: [name] works; daughter [name] lives in [place], other family and friends will assist Caregiver Availability: Other (Comment)([name] works available prn, Patent examiner lives in [place])  Social History Preferred language: English Religion:  Read: Yes Write: Yes Employment Status: Employed Name of Employer: [employer]   Abuse/Neglect Abuse/Neglect Assessment Can Be Completed: Yes Physical Abuse: Denies Verbal Abuse: Denies Sexual Abuse: Denies Exploitation of patient/patient's resources: Denies Self-Neglect: Denies  Emotional Status Pt's affect, behavior and adjustment status: Alert, flat affect, , rt side facial droop, normal mood  Patient / Family Perceptions, Expectations & Goals Pt/Family understanding of illness & functional limitations: Girlfriend appears to have a good understanding of current health status and functional limitations Premorbid pt/family roles/activities: Father, significant other, friend Anticipated changes in roles/activities/participation: Independent prior to admission, will need assistance at discharge Pt/family expectations/goals: Family and friend would like for the patient to be functioning like he was before however understand that it will take time and expect him to get to a level he could get around with some assistance from friend or hired caregiver  Occupational psychologist available at discharge: Friend will provide transportation at discharge  Discharge Planning Living  Arrangements:  Alone Support Systems: Spouse/significant other, Children, Other relatives Type of Residence: Private residence Does the patient have any problems obtaining your medications?: No Home Management: Friend and family will manage the home/hired assistance Patient/Family Preliminary Plans: Plan to discharge to a skilled facility unless the patient makes progress to be able to be manage with minimal assistance [place] Barriers to Discharge: Decreased caregiver support, Lack of/limited family support, Inaccessible home environment, Home environment access/layout [place] Barriers to Discharge Comments: Lived alone PTA, Friend/POA [name] works, available only prn Social Work Anticipated Follow Up Needs: SNF DC Planning Additional Notes/Comments: Discussed with friend the need to identify a PCP for discharge Expected length of stay: 4 weeks  Clinical Impression The patient is alert and oriented to self. Appears restless and struggling to breathe (rapid/abdominal) with oxygen via [place] however sats and pulse are WNL. Was on BiPAP at night. Able to answer if he is comfortable or not and appears to understand what is being explained to him related to change of position and care.  Dorien Chihuahua B 08/06/2019, 8:15 AM

## 2019-07-22 NOTE — Progress Notes (Addendum)
Patient is being discharge to 2W22. Report was given to [place], Therapist, sports. Patient's daughter was Informed on current status and discharge location. Discharge instructions given by Pam, Pa. Respiratory therapist assisted with discharge. All belongings are packed with patient. Amanda Cockayne, LPN

## 2019-07-22 NOTE — Progress Notes (Signed)
SLP Cancellation Note  Patient Details Name: Massey Wulf MRN: [id number] DOB: [birth year]   Cancelled treatment:        Pt with significant medical changes, currently requiring BiPAP due to increased WOB at rest and unable to participate in SLP evaluations today. Also spoke with RN who requested SLP defer evaluations, as he is going to be transferred to acute unit ASAP.                                                                                                 Arbutus Leas 07/20/2019, 10:09 AM

## 2019-07-22 NOTE — Progress Notes (Signed)
Patient placed back on BIPAP due to increased respiratory rate and work of breathing. Patient is tolerating BIPAP at this time. RT will continue to monitor.

## 2019-07-22 NOTE — Progress Notes (Signed)
Inpatient Rehabilitation  Patient information reviewed and entered into eRehab system by Taite Baldassari M. Aryan Bello, M.A., CCC/SLP, PPS Coordinator.  Information including medical coding, functional ability and quality indicators will be reviewed and updated through discharge.    

## 2019-07-22 NOTE — Plan of Care (Signed)
  Problem: Consults Goal: RH GENERAL PATIENT EDUCATION Description: See Patient Education module for education specifics. 07/12/2019 1128 by Amanda Cockayne, LPN Outcome: Not Met (Patient D/C to 2W)   Problem: RH BOWEL ELIMINATION Goal: RH STG MANAGE BOWEL WITH ASSISTANCE Description: STG Manage Bowel with moderate assistance. 07/16/2019 1128 by Amanda Cockayne, LPN Outcome: Not Met (Patient D/C to 2W)   Problem: RH BLADDER ELIMINATION Goal: RH STG MANAGE BLADDER WITH ASSISTANCE Description: STG Manage Bladder With moderate assistance 07/27/2019 1128 by Amanda Cockayne, LPN Outcome: Not Met (Patient D/C to 2W)   Problem: RH SKIN INTEGRITY Goal: RH STG SKIN FREE OF INFECTION/BREAKDOWN Description: Mod assist 07/14/2019 1128 by Amanda Cockayne, LPN Outcome: Not Met (Patient D/C to 2W)   Problem: RH SKIN INTEGRITY Goal: RH STG MAINTAIN SKIN INTEGRITY WITH ASSISTANCE Description: STG Maintain Skin Integrity With moderate  assistance. 07/27/2019 1128 by Amanda Cockayne, LPN Outcome: Not Met (Patient D/C to 2W)   Problem: RH SAFETY Goal: RH STG ADHERE TO SAFETY PRECAUTIONS W/ASSISTANCE/DEVICE Description: STG Adhere to Safety Precautions With  minimal Assistance/Device. 08/01/2019 1128 by Amanda Cockayne, LPN Outcome: Not Met (Patient D/C to 2W)   Problem: RH PAIN MANAGEMENT Goal: RH STG PAIN MANAGED AT OR BELOW PT'S PAIN GOAL Description: Pain less than 3 07/25/2019 1128 by Amanda Cockayne, LPN Outcome: Not Met (Patient D/C to 2W)

## 2019-07-22 NOTE — Plan of Care (Deleted)
  Problem: Consults Goal: RH GENERAL PATIENT EDUCATION Description: See Patient Education module for education specifics. 08/02/2019 1048 by Toy Cookey F, LPN Outcome: Not Met (Pt is being transferred)    Problem: RH BOWEL ELIMINATION Goal: RH STG MANAGE BOWEL WITH ASSISTANCE Description: STG Manage Bowel with moderate assistance. 08/05/2019 1048 by Toy Cookey F, LPN Outcome: Not Met (Pt is being transferred)    Problem: RH BLADDER ELIMINATION Goal: RH STG MANAGE BLADDER WITH ASSISTANCE Description: STG Manage Bladder With moderate assistance 07/27/2019 1048 by Amanda Cockayne, LPN Outcome: Not Met (Pt is being transferred)    Problem: RH SKIN INTEGRITY Goal: RH STG SKIN FREE OF INFECTION/BREAKDOWN Description: Mod assist 07/18/2019 1048 by Amanda Cockayne, LPN Outcome: Not Met (Pt is being transferred)   Goal: RH STG MAINTAIN SKIN INTEGRITY WITH ASSISTANCE Description: STG Maintain Skin Integrity With moderate  assistance. 07/18/2019 1048 by Amanda Cockayne, LPN Outcome: Not Met (Pt is being transferred)    Problem: RH SAFETY Goal: RH STG ADHERE TO SAFETY PRECAUTIONS W/ASSISTANCE/DEVICE Description: STG Adhere to Safety Precautions With  minimal Assistance/Device. 08/04/2019 1048 by Toy Cookey F, LPN Outcome: Not Met (Pt is being transferred)    Problem: RH PAIN MANAGEMENT Goal: RH STG PAIN MANAGED AT OR BELOW PT'S PAIN GOAL Description: Pain less than 3 08/08/2019 1048 by Amanda Cockayne, LPN Outcome: Not Met (Pt is being transferred)

## 2019-07-22 NOTE — Progress Notes (Signed)
PHYSICAL MEDICINE & REHABILITATION PROGRESS NOTE   Subjective/Complaints:  Tachypea and increased  Work of breathing, belly breathing after he was taken off biPAP Required replacemet of BiPAP  Per Respiratory    ROS- unable to obtain  Objective:   VAS [place] UPPER EXTREMITY VENOUS DUPLEX  Result Date: 08/07/2019 UPPER VENOUS STUDY  Indications: Edema Limitations: Line on forearm. Comparison Study: No prior exam. Performing Technologist: Baldwin Crown ARDMS, RVT  Examination Guidelines: A complete evaluation includes B-mode imaging, spectral Doppler, color Doppler, and power Doppler as needed of all accessible portions of each vessel. Bilateral testing is considered an integral part of a complete examination. Limited examinations for reoccurring indications may be performed as noted.  Right Findings: +----------+------------+---------+-----------+----------+-------+ RIGHT     CompressiblePhasicitySpontaneousPropertiesSummary +----------+------------+---------+-----------+----------+-------+ IJV           Full       Yes       Yes                      +----------+------------+---------+-----------+----------+-------+ Subclavian    Full       Yes       Yes                      +----------+------------+---------+-----------+----------+-------+ Axillary      Full       Yes       Yes                      +----------+------------+---------+-----------+----------+-------+ Brachial      Full       Yes       Yes                      +----------+------------+---------+-----------+----------+-------+ Radial        Full                                          +----------+------------+---------+-----------+----------+-------+ Ulnar         Full                                          +----------+------------+---------+-----------+----------+-------+ Cephalic      None                                   Acute   +----------+------------+---------+-----------+----------+-------+ Basilic       Full                                          +----------+------------+---------+-----------+----------+-------+ Thrombus seen in cephalic vein at antecutibal fossa, possibly prior IV site.  Summary:  Right: No evidence of deep vein thrombosis in the upper extremity. Findings consistent with acute superficial vein thrombosis involving the right cephalic vein.  *See table(s) above for measurements and observations.  Diagnosing physician: Harold Barban MD Electronically signed by Harold Barban MD on 07/24/2019 at 7:28:24 AM.    Final    Recent Labs    07/21/19 0353 07/12/2019 0614  WBC 11.5* 14.1*  HGB 12.3* 12.1*  HCT 39.5 39.1  PLT 284  310   Recent Labs    07/21/19 0353 07/20/2019 0614  NA 147* 144  K 4.0 3.8  CL 104 96*  CO2 33* 33*  GLUCOSE 146* 136*  BUN 34* 32*  CREATININE 1.22 1.28*  CALCIUM 8.5* 8.8*    Intake/Output Summary (Last 24 hours) at 07/14/2019 0855 Last data filed at 07/18/2019 0801 Gross per 24 hour  Intake 0 ml  Output --  Net 0 ml     Physical Exam: Vital Signs Blood pressure 131/65, pulse 75, temperature 99.7 F (37.6 C), resp. rate (!) 24, height 5\' 9"  (1.753 m), weight 95.1 kg, SpO2 92 %.   General: No acute distress Mood and affect are appropriate Heart: Regular rate and rhythm no rubs murmurs or extra sounds Lungs: on BiPAP , no rhonchi  Abdomen: Positive bowel sounds, soft nontender to palpation, nondistended Extremities: No clubbing, cyanosis, or edema Skin: No evidence of breakdown, no evidence of rash Neurologic:4/5 in Left and 0/5 RIght  deltoid, bicep, tricep, grip, hip flexor, knee extensors, ankle dorsiflexor and plantar flexor Sensory exam no withdrawal to pinch on Right UE Cerebellar exam normal finger to nose to finger as well as heel to shin in bilateral upper and lower extremities Musculoskeletal: Full range of motion in all 4 extremities. No joint  swelling    Assessment/Plan: 1. Functional deficits secondary to Left PCA infarct  which require 3+ hours per day of interdisciplinary therapy in a comprehensive inpatient rehab setting.  Physiatrist is providing close team supervision and 24 hour management of active medical problems listed below.  Physiatrist and rehab team continue to assess barriers to discharge/monitor patient progress toward functional and medical goals  Care Tool:  Bathing              Bathing assist       Upper Body Dressing/Undressing Upper body dressing   What is the patient wearing?: Hospital gown only    Upper body assist      Lower Body Dressing/Undressing Lower body dressing      What is the patient wearing?: Incontinence brief     Lower body assist       Toileting Toileting    Toileting assist Assist for toileting: Total Assistance - Patient < 25%     Transfers Chair/bed transfer  Transfers assist           Locomotion Ambulation   Ambulation assist              Walk 10 feet activity   Assist           Walk 50 feet activity   Assist           Walk 150 feet activity   Assist           Walk 10 feet on uneven surface  activity   Assist           Wheelchair     Assist               Wheelchair 50 feet with 2 turns activity    Assist            Wheelchair 150 feet activity     Assist          Blood pressure 131/65, pulse 75, temperature 99.7 F (37.6 C), resp. rate (!) 24, height 5\' 9"  (1.753 m), weight 95.1 kg, SpO2 92 %.  Medical Problem List and Plan: 1.  Deficits with mobility, transfers, cognition secondary to left brain  infarcts.             -patient may shower             -ELOS/Goals: 27-32 days/Mod/Max A             CIR re evals PT, OT, SLP- will place on hold- unable to maintain adequate ventilation without  BiPAP , ? New aspiration event, vs worsening CHF ask TR[facility] to re eval for transfer    2. Right antecubital DVT/ Antithrombotics: -DVT/anticoagulation:  Pharmaceutical: Lovenox--monitor             -antiplatelet therapy: DAPT.  3. Pain Management: N/A 4. Mood: LCSW to follow for evaluation and support as mentation improves.              -antipsychotic agents: N/A 5. Neuropsych: This patient is not capable of making decisions on his own behalf. 6. Skin/Wound Care: Air mattress overlay for pressure relief.  7. Fluids/Electrolytes/Nutrition: NPO. Continue to monitor fluid status with I/O.  8. Acute hypercarbic respiratory failure: Now on BIPAP at nights with oxygen per [place]--PCO2- 53.8. Encourage pulmonary toilet as able.               Supplemental oxygen dependent 9. Post stroke dysphagia with recurrent aspiration events: Strict NPO. Tube feeds with water flushes.              Advance diet as tolerated 10. Afib with RVR: NO BB due to soft BP. Monitor HR with increased mobility             Continue amiodarone 200 mg bid and taper by 200 mg weekly to off.  11. 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 Continue lasix daily with DAPT and statin--no ACE/ARB/BB due to soft BP.              Daily weights 12. Sleep wake disruption: Will order sleep chart--used to sleep almost till noon PTA.  May need to schedule melatonin--family does not want any sedatives. BIPAP at nights will hopefully help with better sleep.   82. ASCVD with B-CAS/chronic R-PCA/intracranial stenosis: On DAPT with statin. Avoid hypotension.  14. Abnormal LFTs: Question due to shocked liver. CMP ordered.  15.  High medical complexity with interplay between Congestive Heart failure aspiration pneumonia , A fib - unable to maintain adequate ventilation off BiPAP, unable to toerate rehab at this time, will ask TR[facility] to re eval for transfer   LOS: 1 days A FACE TO FACE EVALUATION WAS PERFORMED  Charlett Blake 07/23/2019, 8:55 AM

## 2019-07-22 NOTE — Progress Notes (Signed)
Patient admitted to [facility] from CIR via bed.  Daughter present.  Patient has peg tube in place with Jevity1.5 infusing at 40 ml/hour.  Bipap on.  Patient is alert and oriented to self.  Denies pain.

## 2019-07-22 NOTE — Care Management (Signed)
   The overall goal for the admission was met for:   Discharge location: Discharge to acute due to medical reasons;Tachypea and increased  Work of breathing, belly breathing after he was taken off biPAP Required replacement of BiPAP  Per Respiratory :CIR re evals PT, OT, SLP- on hold- unable to maintain adequate ventilation without  BiPAP , ? New aspiration event, vs worsening CHF    Length of Stay: 1 day with discharge 08/05/2019  Discharge activity level: Requiring min-mod assist for sitting balance and max assist +2 for transfers.Pt requires extensive max - total A for ADLs/selfcare.  Services provided included: MD, RD, PT, OT, SLP, RN, CM, Pharmacy, Neuropsych and SW  Financial Services: Medicare and Other: BCBS Medicare part D  Comments (or additional information): Plan was to admit to rehab to provide therapy and reduce overall burden of care and then discharge to SNF.  Patient/Family verbalized understanding of follow-up arrangements: Yes  Individual responsible for coordination of the follow-up plan: Bonita Quin is his POA    Margarito Liner

## 2019-07-22 NOTE — Care Management (Signed)
Inpatient Rehabilitation Center Individual Statement of Services  Patient Name:  Levi Pruitt  Date:  08/01/2019  Welcome to the [facility].  Our goal is to provide you with an individualized program based on your diagnosis and situation, designed to meet your specific needs.  With this comprehensive rehabilitation program, you will be expected to participate in at least 3 hours of rehabilitation therapies Monday-Friday, with modified therapy programming on the weekends.  Your rehabilitation program will include the following services:  Physical Therapy (PT), Occupational Therapy (OT), Speech Therapy (ST), 24 hour per day rehabilitation nursing, Therapeutic Recreaction (TR), Neuropsychology, Case Management (Social Worker), Rehabilitation Medicine, Nutrition Services and Pharmacy Services  Weekly team conferences will be held on Wednesdays to discuss your progress.  Your Social Worker will talk with you frequently to get your input and to update you on team discussions.  Team conferences with you and your family in attendance may also be held.  Expected length of stay: 4 weeks  Overall anticipated outcome: Moderate-Maximal assist   Depending on your progress and recovery, your program may change. Your Social Worker will coordinate services and will keep you informed of any changes. Your Social Worker's name and contact numbers are listed  below.  The following services may also be recommended but are not provided by the [facility]:    [place] will be made to provide these services after discharge if needed.  Arrangements include referral to agencies that provide these services.  Your insurance has been verified to be:  Medicare A+B/BCBS Medicare D Your primary doctor is:  No PCP identified  Pertinent information will be shared with your doctor and your insurance  company.  Social Worker:  [place], [place] or ([phone]   Information discussed with POA on previous admission to rehabilitation and updated copy given to patient by: Margarito Liner, 07/28/2019, 8:07 AM upon return to rehabilitation.

## 2019-07-22 NOTE — H&P (Addendum)
History and Physical    Levi Pruitt [id number] DOB: [birth year] DOA: (Not on file)  Referring MD/NP/PA: Dustin Folks, MD PCP: Patient, No Pcp Per  Patient coming from: Inpatient rehab  Chief Complaint: Shortness of breath  I have personally briefly reviewed patient's old medical records in [place]   HPI: Levi Pruitt is a 74 y.o. male with medical history significant of recent CVA with right sided hemiparesis/dysphagia requiring PEG tube placement and complicated by inferior wall MI with subsequent discharge to CIR on 07/07/2019.  While in inpatient rehab patient's condition deteriorated with worsening shortness of breath with atrial fibrillation with RVR and hypotension on 2/4.  Chest x-ray noted worsening infiltrates he was given intravenous Lasix and readmitted into the hospital.  His hospital course was complicated by waxing and waning mental status.  He was continued on BiPAP, given IV Lasix, and empiric antibiotics for suspected recurrent aspiration.  Cardiology was consulted due to the patient being in a atrial fibrillation with hypotension, and he was started on IV amiodarone.  Cardiology adjusted medications of Lasix and amiodarone via PEG.  On 2/8 his mental status deteriorated and he was found to have a large subacute left PCA infarct and small subacute cortical infarcts in left frontal lobe.  Right upper extremity doppler done due to swelling revealed an acute thrombus in the cephalic vein and antecubital site likely due to DVT.  At that time it was recommended that he continue on dual antiplatelet therapy.  He was able to be weaned off BiPAP down to nasal cannula oxygen and recommended to wear BiPAP at night for hypercapnic respiratory failure.  Palliative care have been consulted due to patient's poor overall prognosis and family elected for no intubation.  He medically optimized and cleared to resume his rehab course and admitted back to inpatient rehab yesterday.  Overnight  the patient was on BiPAP, but this morning when it was removed had developed acute respiratory distress with increased work of breathing.  Patient was placed on BiPAP for work of breathing.  ABG revealed pH 7.411, PO2 108, and PCO2 57.7 while on BiPAP.  Chest x-ray showed persistent bilateral opacities with increase in the left upper lobe.  Labs revealed WBC 14.1, hemoglobin 12.1, CO2 33, BUN 32, creatinine 1.28, AST 153, ALT 267.   ED Course: As seen above.  Review of Systems  Eyes: Negative for pain.  Respiratory: Positive for shortness of breath.     Past Medical History:  Diagnosis Date  . 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 IR GASTROSTOMY TUBE MOD SED  07/06/2019     reports that he has never smoked. He has never used smokeless tobacco. He reports that he does not drink alcohol or use drugs.  No Known Allergies  Family History  Problem Relation Age of Onset  . Heart disease Father   . High blood pressure Sister        is his twin    Prior to Admission medications   Medication Sig Start Date End Date Taking? Authorizing Provider  amiodarone (PACERONE) 200 MG tablet Place 1 tablet (200 mg total) into feeding tube 2 (two) times daily. 07/21/19   Aline August, MD  aspirin 81 MG chewable tablet Place 4 tablets (324 mg total) into feeding tube daily. 07/08/19   Dessa Phi, DO  atorvastatin (LIPITOR) 80 MG tablet Place 1 tablet (80 mg total) into feeding tube  daily at 6 PM. 07/07/19   Dessa Phi, DO  clopidogrel (PLAVIX) 75 MG tablet Place 1 tablet (75 mg total) into feeding tube daily. 07/07/19   Dessa Phi, DO  enoxaparin (LOVENOX) 40 MG/0.4ML injection Inject 0.4 mLs (40 mg total) into the skin daily. 07/15/19   Angiulli, Lavon Paganini, PA-C  furosemide (LASIX) 40 MG tablet Place 0.5 tablets (20 mg total) into feeding tube daily. 07/21/19   Aline August, MD  insulin aspart (NOVOLOG) 100  UNIT/ML injection Inject 0-9 Units into the skin every 4 (four) hours. 07/15/19   Angiulli, Lavon Paganini, PA-C  ipratropium-albuterol (DUONEB) 0.5-2.5 (3) MG/3ML SOLN Take 3 mLs by nebulization every 6 (six) hours as needed. 07/15/19   Angiulli, Lavon Paganini, PA-C  Melatonin 3 MG TABS Take 2 tablets (6 mg total) by mouth at bedtime as needed (insomnia). 07/15/19   Angiulli, Lavon Paganini, PA-C  methylphenidate (RITALIN) 5 MG tablet Take 1 tablet (5 mg total) by mouth 2 (two) times daily with breakfast and lunch. 07/15/19   Angiulli, Lavon Paganini, PA-C  Nutritional Supplements (FEEDING SUPPLEMENT, JEVITY 1.5 CAL/FIBER,) LIQD Take 1,000 mLs by mouth continuous. 07/15/19   Angiulli, Lavon Paganini, PA-C  Water For Irrigation, Sterile (FREE WATER) SOLN Place 200 mLs into feeding tube every 6 (six) hours. 07/21/19   Aline August, MD    Physical Exam:  Constitutional: Male who appears to be in no acute distress currently Vitals:   07/23/2019 1650  Pulse: 61  SpO2: 100%   Eyes: PERRL, lids and conjunctivae normal ENMT: Mucous membranes are dry. Posterior pharynx clear of any exudate or lesions.  Neck: normal, supple, no masses, no thyromegaly Respiratory: Currently on BiPAP with nonlabored breathing.  Positive rhonchi Cardiovascular: Regular rate and rhythm, no murmurs / rubs / gallops. No extremity edema. 2+ pedal pulses. No carotid bruits.  Abdomen: no tenderness, no masses palpated. No hepatosplenomegaly.  PEG tube in place. Musculoskeletal: no clubbing / cyanosis. No joint deformity upper and lower extremities. Good ROM, no contractures. Normal muscle tone.  Skin: no rashes, lesions, ulcers. No induration Neurologic: CN 2-12 grossly intact. Sensation intact, DTR normal. Strength 5/5 in all 4.  Psychiatric: Normal judgment and insight. Alert and oriented x 3. Normal mood.     Labs on Admission: I have personally reviewed following labs and imaging studies  CBC: Recent Labs  Lab 07/17/19 0812 07/17/19 0812  07/18/19 0320 07/19/19 0337 07/20/19 0256 07/21/19 0353 07/28/2019 0614  WBC 14.0*   < > 13.5* 11.6* 13.3* 11.5* 14.1*  NEUTROABS 10.0*  --   --   --   --   --  10.8*  HGB 13.8   < > 13.6 13.9 12.9* 12.3* 12.1*  HCT 42.8   < > 43.4 44.8 42.0 39.5 39.1  MCV 94.5   < > 97.3 99.6 100.2* 98.5 99.0  PLT 321   < > 331 321 305 284 310   < > = values in this interval not displayed.   Basic Metabolic Panel: Recent Labs  Lab 07/17/19 0812 07/17/19 [id number] 07/18/19 0320 07/18/19 0320 07/19/19 [id number] 07/20/19 0256 07/20/19 1828 07/21/19 0353 07/18/2019 0614  NA 142   < > 146*  --  148* 146*  --  147* 144  K 3.3*   < > 3.6   < > 3.4* 3.2* 4.2 4.0 3.8  CL 96*   < > 100  --  102 99  --  104 96*  CO2 34*   < > 34*  --  36* 37*  --  33* 33*  GLUCOSE 176*   < > 168*  --  156* 107*  --  146* 136*  BUN 34*   < > 33*  --  32* 35*  --  34* 32*  CREATININE 1.41*   < > 1.26*  --  1.06 1.20  --  1.22 1.28*  CALCIUM 8.5*   < > 8.3*  --  8.6* 8.5*  --  8.5* 8.8*  MG 2.5*  --  2.4  --   --  2.3  --   --   --   PHOS 3.6  --   --   --   --   --   --   --   --    < > = values in this interval not displayed.   GFR: Estimated Creatinine Clearance: 58.5 mL/min (A) (by C-G formula based on SCr of 1.28 mg/dL (H)). Liver Function Tests: Recent Labs  Lab 07/18/19 0320 07/19/19 0337 07/20/19 0256 07/21/19 0353 07/14/2019 0614  AST 88* 95* 177* 200* 153*  ALT 144* 155* 220* 283* 267*  ALKPHOS 121 125 109 113 115  BILITOT 0.6 0.5 0.4 0.5 0.7  PROT 6.1* 6.1* 5.9* 5.6* 5.9*  ALBUMIN 2.5* 2.4* 2.3* 2.3* 2.4*   No results for input(s): LIPASE, AMYLASE in the last 168 hours. Recent Labs  Lab 07/16/19 0941  AMMONIA 25   Coagulation Profile: No results for input(s): INR, PROTIME in the last 168 hours. Cardiac Enzymes: No results for input(s): CKTOTAL, CKMB, CKMBINDEX, TROPONINI in the last 168 hours. BNP (last 3 results) No results for input(s): PROBNP in the last 8760 hours. HbA1C: No results for input(s):  HGBA1C in the last 72 hours. CBG: Recent Labs  Lab 07/21/19 1624 07/21/19 1959 07/21/19 2353 07/21/2019 0358 07/23/2019 0824  GLUCAP 123* 107* 156* 119* 134*   Lipid Profile: No results for input(s): CHOL, HDL, LDLCALC, TRIG, CHOLHDL, LDLDIRECT in the last 72 hours. Thyroid Function Tests: No results for input(s): TSH, T4TOTAL, FREET4, T3FREE, THYROIDAB in the last 72 hours. Anemia Panel: No results for input(s): VITAMINB12, FOLATE, FERRITIN, TIBC, IRON, RETICCTPCT in the last 72 hours. Urine analysis:    Component Value Date/Time   COLORURINE YELLOW 07/07/2019 1700   APPEARANCEUR CLEAR 07/07/2019 1700   LABSPEC 1.005 07/07/2019 1700   PHURINE 6.0 07/07/2019 1700   GLUCOSEU NEGATIVE 07/07/2019 1700   HGBUR NEGATIVE 07/07/2019 1700   BILIRUBINUR NEGATIVE 07/07/2019 1700   KETONESUR NEGATIVE 07/07/2019 1700   PROTEINUR NEGATIVE 07/07/2019 1700   NITRITE NEGATIVE 07/07/2019 1700   LEUKOCYTESUR NEGATIVE 07/07/2019 1700   Sepsis Labs: Recent Results (from the past 240 hour(s))  Culture, blood (routine x 2)     Status: None   Collection Time: 07/15/19  6:52 AM   Specimen: BLOOD  Result Value Ref Range Status   Specimen Description BLOOD RIGHT ANTECUBITAL  Final   Special Requests   Final    BOTTLES DRAWN AEROBIC ONLY Blood Culture results may not be optimal due to an inadequate volume of blood received in culture bottles   Culture   Final    NO GROWTH 5 DAYS Performed at [facility], [place] [address] [place], [place] [address]    Report Status 07/20/2019 FINAL  Final  Culture, blood (routine x 2)     Status: None   Collection Time: 07/15/19  7:01 AM   Specimen: BLOOD RIGHT ARM  Result Value Ref Range Status   Specimen Description BLOOD RIGHT  ARM  Final   Special Requests   Final    BOTTLES DRAWN AEROBIC ONLY Blood Culture adequate volume   Culture   Final    NO GROWTH 5 DAYS Performed at [facility], [address], [place], [place] [address]    Report  Status 07/20/2019 FINAL  Final  SARS CORONAVIRUS 2 (TAT 6-24 HRS) Nasopharyngeal Nasopharyngeal Swab     Status: None   Collection Time: 07/15/19  9:13 AM   Specimen: Nasopharyngeal Swab  Result Value Ref Range Status   SARS Coronavirus 2 NEGATIVE NEGATIVE Final    Comment: (NOTE) SARS-CoV-2 target nucleic acids are NOT DETECTED. The SARS-CoV-2 RNA is generally detectable in upper and lower respiratory specimens during the acute phase of infection. Negative results do not preclude SARS-CoV-2 infection, do not rule out co-infections with other pathogens, and should not be used as the sole basis for treatment or other patient management decisions. Negative results must be combined with clinical observations, patient history, and epidemiological information. The expected result is Negative. Fact Sheet for Patients: SugarRoll.be Fact Sheet for Healthcare Providers: https://www.woods-mathews.com/ This test is not yet approved or cleared by the [place] FDA and  has been authorized for detection and/or diagnosis of SARS-CoV-2 by FDA under an Emergency Use Authorization (EUA). This EUA will remain  in effect (meaning this test can be used) for the duration of the COVID-19 declaration under Section 56 4(b)(1) of the Act, 21 U.S.C. section 360bbb-3(b)(1), unless the authorization is terminated or revoked sooner. Performed at [facility], [place] [address] [place], [place] [address]   MRSA PCR Screening     Status: None   Collection Time: 07/16/19 12:05 PM   Specimen: Nasal Mucosa; Nasopharyngeal  Result Value Ref Range Status   MRSA by PCR NEGATIVE NEGATIVE Final    Comment:        The GeneXpert MRSA Assay (FDA approved for NASAL specimens only), is one component of a comprehensive MRSA colonization surveillance program. It is not intended to diagnose MRSA infection nor to guide or monitor treatment for MRSA infections. Performed  at [facility], [place] [address], [place], [place] [address]      Radiological Exams on Admission: DG Chest 1 View  Result Date: 07/14/2019 CLINICAL DATA:  Dyspnea, history stroke, MI, CHF, atrial fibrillation EXAM: CHEST  1 VIEW COMPARISON:  Portable exam 0943 hours compared to 07/19/2019 FINDINGS: Upper normal heart size. Stable mediastinal contours. BILATERAL pulmonary infiltrates, greater in the upper lobes. Associated volume loss and pleuroparenchymal thickening on RIGHT. Suspect mild progression in LEFT upper lobe since prior study. Minimal RIGHT pleural effusion. No pneumothorax. Bones demineralized. IMPRESSION: Persistent BILATERAL pulmonary infiltrates consistent with multifocal pneumonia, slightly increased in LEFT upper lobe. Electronically Signed   By: Lavonia Dana M.D.   On: 07/21/2019 09:56   VAS [place] UPPER EXTREMITY VENOUS DUPLEX  Result Date: 07/17/2019 UPPER VENOUS STUDY  Indications: Edema Limitations: Line on forearm. Comparison Study: No prior exam. Performing Technologist: Baldwin Crown ARDMS, RVT  Examination Guidelines: A complete evaluation includes B-mode imaging, spectral Doppler, color Doppler, and power Doppler as needed of all accessible portions of each vessel. Bilateral testing is considered an integral part of a complete examination. Limited examinations for reoccurring indications may be performed as noted.  Right Findings: +----------+------------+---------+-----------+----------+-------+ RIGHT     CompressiblePhasicitySpontaneousPropertiesSummary +----------+------------+---------+-----------+----------+-------+ IJV           Full       Yes  Yes                      +----------+------------+---------+-----------+----------+-------+ Subclavian    Full       Yes       Yes                      +----------+------------+---------+-----------+----------+-------+ Axillary      Full       Yes       Yes                       +----------+------------+---------+-----------+----------+-------+ Brachial      Full       Yes       Yes                      +----------+------------+---------+-----------+----------+-------+ Radial        Full                                          +----------+------------+---------+-----------+----------+-------+ Ulnar         Full                                          +----------+------------+---------+-----------+----------+-------+ Cephalic      None                                   Acute  +----------+------------+---------+-----------+----------+-------+ Basilic       Full                                          +----------+------------+---------+-----------+----------+-------+ Thrombus seen in cephalic vein at antecutibal fossa, possibly prior IV site.  Summary:  Right: No evidence of deep vein thrombosis in the upper extremity. Findings consistent with acute superficial vein thrombosis involving the right cephalic vein.  *See table(s) above for measurements and observations.  Diagnosing physician: Harold Barban MD Electronically signed by Harold Barban MD on 08/06/2019 at 7:28:24 AM.    Final     EKG: Independently reviewed.  Normal sinus rhythm at 67 bpm  Assessment/Plan Respiratory failure with hypercapnia and hypoxia, suspected aspiration pneumonia: Acute on chronic.  Patient readmitted into the hospital after developing acute respiratory distress.  ABG on BiPAP revealed a PCO2 of 57.7.  Chest x-ray showing persistent bilateral opacities with slight increase in the left upper lobe concerning for multifocal pneumonia.  It appears previously symptoms were related to a combination of aspiration and CHF. 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  During last hospitalization patient had signs of heart failure in addition to pneumonia. Last EF noted to be 30 to 35% with grade 3 diastolic dysfunction on [id number].  -Strict intake and output -Daily weights -Fluid restriction -Follow-up BNP(BNP elevated at 990.5) -Change to Lasix IV 20 mg twice daily -Consider need to reconsult cardiology  Leukocytosis: Acute on  chronic.  WBC elevated up to 14.1 today, but has been elevated since his admission in January. -Continue to monitor  Paroxysmal atrial fibrillation: Patient currently appears to be in normal sinus rhythm.  Patient was supposed to continue on amiodarone 200 mg twice daily x1 week, then once twice daily x1 week, and then discontinue.  Anticoagulation had not been recommended by cardiology. -Continue amiodarone as previously prescribed  Acute kidney injury: Creatinine elevated up to 1.28 with BUN 32.  Question hypoperfusion. 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 -Continue dual antiplatelet therapy and statin -PT/OT/speech therapy to eval and treat -Continue tube feeds per PEG  Recent acute inferior STEMI: Patient was not a candidate for any cardiac intervention at that time. -Continue current regimen  Elevated liver enzymes: Acute.  Patient noted during his previous hospitalization to have elevation in his liver enzymes that was thought possibly related to sepsis versus congestion.  Lipitor had been continued due to recent stroke.  Of these appear to be coming down. -Continue to monitor  Severe carotid stenosis, PCA, vertebral stenosis: Previously recommended for outpatient  follow-up with vascular surgery.  Recommendations including avoid low blood pressures. -Continue aspirin, Plavix, and statin  8 mm parotid neoplasm -Outpatient follow-up with ENT once acute issues resolved  Right cephalic vein thrombosis: Doppler ultrasound revealed the cephalic vein thrombus on 2/11.  Thought to be superficial in nature related to the IV.  Not to require anticoagulation.  Diabetes mellitus type 2: Last hemoglobin A1c 5.7 on 07/15/2019. -Hypoglycemic protocols -CBGs every 4 hours with sensitive SSI    DVT prophylaxis: Lovenox Code Status: Partial code with no intubation Family Communication: Discussed plan of care with the CT scan of the head on daughter present at bedside Disposition Plan: To be determined Consults called: None Admission status: Inpatient  Norval Morton MD [facility] Pager [phone]   If 7PM-7AM, please contact night-coverage [email or web address] Password [email or web address]  08/03/2019, 10:50 AM

## 2019-07-23 DIAGNOSIS — Z515 Encounter for palliative care: Secondary | ICD-10-CM

## 2019-07-23 DIAGNOSIS — Z789 Other specified health status: Secondary | ICD-10-CM

## 2019-07-23 LAB — BASIC METABOLIC PANEL
Anion gap: 12 (ref 5–15)
BUN: 29 mg/dL — ABNORMAL HIGH (ref 8–23)
CO2: 32 mmol/L (ref 22–32)
Calcium: 8.3 mg/dL — ABNORMAL LOW (ref 8.9–10.3)
Chloride: 99 mmol/L (ref 98–111)
Creatinine, Ser: 1.08 mg/dL (ref 0.61–1.24)
GFR calc Af Amer: >60 mL/min (ref >60)
GFR calc non Af Amer: >60 mL/min (ref >60)
Glucose, Bld: 130 mg/dL — ABNORMAL HIGH (ref 70–99)
Potassium: 3.7 mmol/L (ref 3.5–5.1)
Sodium: 143 mmol/L (ref 135–145)

## 2019-07-23 LAB — CBC
HCT: 38.2 % — ABNORMAL LOW (ref 39.0–52.0)
Hemoglobin: 11.8 g/dL — ABNORMAL LOW (ref 13.0–17.0)
MCH: 30.6 pg (ref 26.0–34.0)
MCHC: 30.9 g/dL (ref 30.0–36.0)
MCV: 99.2 fL (ref 80.0–100.0)
Platelets: 265 10*3/uL (ref 150–400)
RBC: 3.85 MIL/uL — ABNORMAL LOW (ref 4.22–5.81)
RDW: 14.1 % (ref 11.5–15.5)
WBC: 12.1 10*3/uL — ABNORMAL HIGH (ref 4.0–10.5)
nRBC: 0 % (ref 0.0–0.2)

## 2019-07-23 LAB — GLUCOSE, CAPILLARY
Glucose-Capillary: 113 mg/dL — ABNORMAL HIGH (ref 70–99)
Glucose-Capillary: 152 mg/dL — ABNORMAL HIGH (ref 70–99)
Glucose-Capillary: 97 mg/dL (ref 70–99)
Glucose-Capillary: 111 mg/dL — ABNORMAL HIGH (ref 70–99)
Glucose-Capillary: 98 mg/dL (ref 70–99)
Glucose-Capillary: 143 mg/dL — ABNORMAL HIGH (ref 70–99)

## 2019-07-23 LAB — CREATININE, URINE, RANDOM: Creatinine, Urine: 136.4 mg/dL

## 2019-07-23 MED ORDER — INSULIN ASPART 100 UNIT/ML ~~LOC~~ SOLN
0.0000 [IU] | SUBCUTANEOUS | Status: DC
  Administered 2019-07-23: 2 [IU] via SUBCUTANEOUS
  Administered 2019-07-23 – 2019-07-27 (×12): 1 [IU] via SUBCUTANEOUS
  Administered 2019-07-27: 2 [IU] via SUBCUTANEOUS
  Administered 2019-07-28: 1 [IU] via SUBCUTANEOUS

## 2019-07-23 MED ORDER — JEVITY 1.5 CAL/FIBER PO LIQD
1000.0000 mL | ORAL | Status: DC
  Administered 2019-07-23: 1000 mL
  Filled 2019-07-23 (×8): qty 1000

## 2019-07-23 MED ORDER — FREE WATER
250.0000 mL | Freq: Four times a day (QID) | Status: DC
  Administered 2019-07-23 – 2019-07-28 (×20): 250 mL

## 2019-07-23 NOTE — Progress Notes (Signed)
OT Cancellation Note  Patient Details Name: Estanislao Torstenson MRN: [id number] DOB: 1946-05-14   Cancelled Treatment:    Reason Eval/Treat Not Completed: Medical issues which prohibited therapy. Pt currently on Bipap. Plan to hold OT eval until respiratory status improves and pt is able to be off bipap.   Tyrone Schimke, OT Acute Rehabilitation Services Pager: [phone] Office: [phone]  07/23/2019, 8:59 AM

## 2019-07-23 NOTE — Progress Notes (Signed)
PROGRESS NOTE    Levi Pruitt  [id number] DOB: 1945-08-26 DOA: 08/06/2019 PCP: Patient, No Pcp Per     Brief Narrative:  Levi Pruitt is a 74 y.o. male with medical history significant of recent CVA with right sided hemiparesis/dysphagia requiring PEG tube placement and complicated by inferior wall MI with subsequent discharge to CIR on 07/07/2019.  While in inpatient rehab, patient's condition deteriorated with worsening shortness of breath with atrial fibrillation with RVR and hypotension on 2/4.  Chest x-ray noted worsening infiltrates he was given intravenous Lasix and readmitted into the hospital.  His hospital course was complicated by waxing and waning mental status.  He was continued on BiPAP, given IV Lasix, and empiric antibiotics for suspected recurrent aspiration.  Cardiology was consulted due to the patient being in atrial fibrillation with hypotension, and he was started on IV amiodarone.  Cardiology adjusted medications of Lasix and amiodarone via PEG.  On 2/8 his mental status deteriorated and he was found to have a large subacute left PCA infarct and small subacute cortical infarcts in left frontal lobe.  Right upper extremity doppler done due to swelling revealed an acute thrombus in the cephalic vein and antecubital site likely due to DVT.  At that time it was recommended that he continue on dual antiplatelet therapy.  He was able to be weaned off BiPAP down to nasal cannula oxygen and recommended to wear BiPAP at night for hypercapnic respiratory failure.  Palliative care have been consulted due to patient's poor overall prognosis and family elected for no intubation.  He medically optimized and cleared to resume his rehab course and admitted back to inpatient rehab 2/10.  Overnight, the patient was on BiPAP, but this morning when it was removed, he had developed acute respiratory distress with increased work of breathing.  Patient was placed on BiPAP for work of breathing and  transferred back to the hospital.   New events last 24 hours / Subjective: Taken off BiPAP this morning 15 min prior to my evaluation. He remained on 3L [place] O2 without respiratory distress.   Assessment & Plan:   Principal Problem:   Acute on chronic respiratory failure with hypoxia and hypercapnia (HCC) Active Problems:   CHF (congestive heart failure) (HCC)   Dysphagia, post-stroke   Transaminitis   History of CVA with residual deficit   History of myocardial infarction   Thrombosis of right cephalic vein   Acute on chronic respiratory failure with hypercapnia and hypoxia, suspected aspiration pneumonia as well as acute on chronic combined systolic and diastolic heart failure -Patient readmitted into the hospital after developing acute respiratory distress.  Placed on BiPAP.  He had been using BiPAP nightly most recently.  -Chest x-ray showing persistent bilateral opacities with slight increase in the left upper lobe concerning for multifocal pneumonia.  It appears previously symptoms were related to a combination of aspiration and CHF. -Last EF noted to be 30 to 35% with grade 3 diastolic dysfunction on [id number].  -BNP 990.5 -Procalcitonin 0.82 -Continue Rocephin and azithromycin  -Continue IV Lasix -Off BiPAP this morning, continue to monitor closely  Paroxysmal atrial fibrillation -Continue on amiodarone 200 mg twice daily x1 week, then once twice daily x1 week, and then discontinue.  Anticoagulation had not been recommended by cardiology.  Acute kidney injury -Continue to monitor closely while on IV Lasix  Recent CVA with with residual weakness and dysphagia -CT scan of the brain on 07/19/2019 showed baseline atrophy, subacute large PCA/small subcortical infarct with petechial hemorrhage  and/or cortical laminar necrosis noted within portions of the left parieto-occipital infarction territory. -Continue dual antiplatelet therapy and statin -PT/OT/speech therapy to eval and  treat -Continue tube feeds per PEG  Recent acute inferior STEMI -Patient was not a candidate for any cardiac intervention at that time  Elevated liver enzymes -Patient noted during his previous hospitalization to have elevation in his liver enzymes that was thought possibly related to sepsis versus congestion.  Lipitor had been continued due to recent stroke -Continue to monitor, improved  Severe carotid stenosis, PCA, vertebral stenosis -Previously recommended for outpatient follow-up with vascular surgery.  Recommendations including avoid low blood pressures. -Continue aspirin, Plavix, and statin  8 mm parotid neoplasm -Outpatient follow-up with ENT once acute issues resolved  Right cephalic vein thrombosis -Doppler ultrasound revealed the cephalic vein thrombus on 2/11.  Thought to be superficial in nature related to the IV.  Not to require anticoagulation.  Diabetes mellitus type 2 -Hemoglobin A1c 5.7 on 07/15/2019 -CBGs every 4 hours with sensitive SSI    DVT prophylaxis: Lovenox Code Status: DNI Family Communication: None at bedside Disposition Plan:   Patient is from home and most recently CIR prior to admission.  Currently in-hospital treatment needed due to respiratory failure requiring BiPAP.   Suspect patient will discharge to SNF vs CIR.    Consultants:   Palliative care medicine  Procedures:   None   Antimicrobials:  Anti-infectives (From admission, onward)   Start     Dose/Rate Route Frequency Ordered Stop   07/23/2019 1800  cefTRIAXone (ROCEPHIN) 2 g in sodium chloride 0.9 % 100 mL IVPB     2 g 200 mL/hr over 30 Minutes Intravenous Every 24 hours 07/17/2019 1745     07/30/2019 1800  azithromycin (ZITHROMAX) 500 mg in sodium chloride 0.9 % 250 mL IVPB     500 mg 250 mL/hr over 60 Minutes Intravenous Every 24 hours 07/25/2019 1749          Objective: Vitals:   07/23/19 0800 07/23/19 0810 07/23/19 0821 07/23/19 0907  BP:  105/64 119/70   Pulse:  61 61 65 67  Resp: 18 18 (!) 23 16  Temp:   98.7 F (37.1 C)   TempSrc:   Oral   SpO2: 98% 99% 97% 97%    Intake/Output Summary (Last 24 hours) at 07/23/2019 1130 Last data filed at 07/23/2019 0600 Gross per 24 hour  Intake 790 ml  Output 20 ml  Net 770 ml   There were no vitals filed for this visit.  Examination:  General exam: Appears calm  Respiratory system: Respiratory effort normal. No respiratory distress.  On 3 L nasal cannula O2 Cardiovascular system: S1 & S2 heard, RRR. No murmurs. No pedal edema. Gastrointestinal system: Abdomen is nondistended, soft and nontender. Normal bowel sounds heard. Central nervous system: Alert, able to follow some commands, right-sided hemiparesis, does not answer all questions appropriately and is slightly confused, dysarthric Extremities: Symmetric in appearance    Data Reviewed: I have personally reviewed following labs and imaging studies  CBC: Recent Labs  Lab 07/17/19 0812 07/18/19 0320 07/19/19 0337 07/20/19 0256 07/21/19 0353 08/07/2019 0614 07/23/19 0344  WBC 14.0*   < > 11.6* 13.3* 11.5* 14.1* 12.1*  NEUTROABS 10.0*  --   --   --   --  10.8*  --   HGB 13.8   < > 13.9 12.9* 12.3* 12.1* 11.8*  HCT 42.8   < > 44.8 42.0 39.5 39.1 38.2*  MCV 94.5   < >  99.6 100.2* 98.5 99.0 99.2  PLT 321   < > 321 305 284 310 265   < > = values in this interval not displayed.   Basic Metabolic Panel: Recent Labs  Lab 07/17/19 [id number] 07/17/19 [id number] 07/18/19 0320 07/18/19 0320 07/19/19 [id number] 07/19/19 [id number] 07/20/19 0256 07/20/19 1828 07/21/19 0353 08/01/2019 0614 07/23/19 0344  NA 142   < > 146*   < > 148*  --  146*  --  147* 144 143  K 3.3*   < > 3.6   < > 3.4*   < > 3.2* 4.2 4.0 3.8 3.7  CL 96*   < > 100   < > 102  --  99  --  104 96* 99  CO2 34*   < > 34*   < > 36*  --  37*  --  33* 33* 32  GLUCOSE 176*   < > 168*   < > 156*  --  107*  --  146* 136* 130*  BUN 34*   < > 33*   < > 32*  --  35*  --  34* 32* 29*  CREATININE 1.41*   < >  1.26*   < > 1.06  --  1.20  --  1.22 1.28* 1.08  CALCIUM 8.5*   < > 8.3*   < > 8.6*  --  8.5*  --  8.5* 8.8* 8.3*  MG 2.5*  --  2.4  --   --   --  2.3  --   --   --   --   PHOS 3.6  --   --   --   --   --   --   --   --   --   --    < > = values in this interval not displayed.   GFR: Estimated Creatinine Clearance: 69.4 mL/min (by C-G formula based on SCr of 1.08 mg/dL). Liver Function Tests: Recent Labs  Lab 07/18/19 0320 07/19/19 0337 07/20/19 0256 07/21/19 0353 07/17/2019 0614  AST 88* 95* 177* 200* 153*  ALT 144* 155* 220* 283* 267*  ALKPHOS 121 125 109 113 115  BILITOT 0.6 0.5 0.4 0.5 0.7  PROT 6.1* 6.1* 5.9* 5.6* 5.9*  ALBUMIN 2.5* 2.4* 2.3* 2.3* 2.4*   No results for input(s): LIPASE, AMYLASE in the last 168 hours. No results for input(s): AMMONIA in the last 168 hours. Coagulation Profile: No results for input(s): INR, PROTIME in the last 168 hours. Cardiac Enzymes: No results for input(s): CKTOTAL, CKMB, CKMBINDEX, TROPONINI in the last 168 hours. BNP (last 3 results) No results for input(s): PROBNP in the last 8760 hours. HbA1C: No results for input(s): HGBA1C in the last 72 hours. CBG: Recent Labs  Lab 07/31/2019 1151 08/01/2019 1518 07/28/2019 1922 07/18/2019 2337 07/23/19 0747  GLUCAP 85 77 106* 120* 113*   Lipid Profile: No results for input(s): CHOL, HDL, LDLCALC, TRIG, CHOLHDL, LDLDIRECT in the last 72 hours. Thyroid Function Tests: No results for input(s): TSH, T4TOTAL, FREET4, T3FREE, THYROIDAB in the last 72 hours. Anemia Panel: No results for input(s): VITAMINB12, FOLATE, FERRITIN, TIBC, IRON, RETICCTPCT in the last 72 hours. Sepsis Labs: Recent Labs  Lab 07/18/19 0320 08/03/2019 1158  PROCALCITON  --  0.82  LATICACIDVEN 1.2  --     Recent Results (from the past 240 hour(s))  Culture, blood (routine x 2)     Status: None   Collection Time: 07/15/19  6:52 AM   Specimen: BLOOD  Result Value Ref Range Status   Specimen Description BLOOD RIGHT  ANTECUBITAL  Final   Special Requests   Final    BOTTLES DRAWN AEROBIC ONLY Blood Culture results may not be optimal due to an inadequate volume of blood received in culture bottles   Culture   Final    NO GROWTH 5 DAYS Performed at [facility] [address], [place], [place] [address]    Report Status 07/20/2019 FINAL  Final  Culture, blood (routine x 2)     Status: None   Collection Time: 07/15/19  7:01 AM   Specimen: BLOOD RIGHT ARM  Result Value Ref Range Status   Specimen Description BLOOD RIGHT ARM  Final   Special Requests   Final    BOTTLES DRAWN AEROBIC ONLY Blood Culture adequate volume   Culture   Final    NO GROWTH 5 DAYS Performed at [facility], [place] [address] [place], [place] [address]    Report Status 07/20/2019 FINAL  Final  SARS CORONAVIRUS 2 (TAT 6-24 HRS) Nasopharyngeal Nasopharyngeal Swab     Status: None   Collection Time: 07/15/19  9:13 AM   Specimen: Nasopharyngeal Swab  Result Value Ref Range Status   SARS Coronavirus 2 NEGATIVE NEGATIVE Final    Comment: (NOTE) SARS-CoV-2 target nucleic acids are NOT DETECTED. The SARS-CoV-2 RNA is generally detectable in upper and lower respiratory specimens during the acute phase of infection. Negative results do not preclude SARS-CoV-2 infection, do not rule out co-infections with other pathogens, and should not be used as the sole basis for treatment or other patient management decisions. Negative results must be combined with clinical observations, patient history, and epidemiological information. The expected result is Negative. Fact Sheet for Patients: SugarRoll.be Fact Sheet for Healthcare Providers: https://www.woods-mathews.com/ This test is not yet approved or cleared by the [place] FDA and  has been authorized for detection and/or diagnosis of SARS-CoV-2 by FDA under an Emergency Use Authorization (EUA). This EUA will remain  in effect  (meaning this test can be used) for the duration of the COVID-19 declaration under Section 56 4(b)(1) of the Act, 21 U.S.C. section 360bbb-3(b)(1), unless the authorization is terminated or revoked sooner. Performed at [facility], [place] [address], [place], [place] [address]   MRSA PCR Screening     Status: None   Collection Time: 07/16/19 12:05 PM   Specimen: Nasal Mucosa; Nasopharyngeal  Result Value Ref Range Status   MRSA by PCR NEGATIVE NEGATIVE Final    Comment:        The GeneXpert MRSA Assay (FDA approved for NASAL specimens only), is one component of a comprehensive MRSA colonization surveillance program. It is not intended to diagnose MRSA infection nor to guide or monitor treatment for MRSA infections. Performed at [facility], [place] [address], [place],  [address]       Radiology Studies: DG Chest 1 View  Result Date: 07/31/2019 CLINICAL DATA:  Dyspnea, history stroke, MI, CHF, atrial fibrillation EXAM: CHEST  1 VIEW COMPARISON:  Portable exam 0943 hours compared to 07/19/2019 FINDINGS: Upper normal heart size. Stable mediastinal contours. BILATERAL pulmonary infiltrates, greater in the upper lobes. Associated volume loss and pleuroparenchymal thickening on RIGHT. Suspect mild progression in LEFT upper lobe since prior study. Minimal RIGHT pleural effusion. No pneumothorax. Bones demineralized. IMPRESSION: Persistent BILATERAL pulmonary infiltrates consistent with multifocal pneumonia, slightly increased in LEFT upper lobe. Electronically Signed   By: Crist Infante.D.  On: 07/28/2019 09:56      Scheduled Meds:  amiodarone  200 mg Per Tube BID   aspirin  324 mg Per Tube Daily   atorvastatin  80 mg Per Tube q1800   clopidogrel  75 mg Per Tube Daily   enoxaparin  40 mg Subcutaneous Q24H   free water  250 mL Per Tube Q6H   furosemide  20 mg Intravenous BID   insulin aspart  0-9 Units Subcutaneous Q4H   methylphenidate  5 mg Oral BID WC     sodium chloride flush  3 mL Intravenous Q12H   Continuous Infusions:  azithromycin 500 mg (07/24/2019 1843)   cefTRIAXone (ROCEPHIN)  IV 2 g (07/24/2019 1836)   feeding supplement (JEVITY 1.5 CAL/FIBER) 1,000 mL (07/23/19 0936)     LOS: 1 day      Time spent: 45 minutes   Dessa Phi, DO [facility] 07/23/2019, 11:30 AM   Available via Epic secure chat 7am-7pm After these hours, please refer to coverage provider listed on [email or web address]

## 2019-07-23 NOTE — Progress Notes (Signed)
PT Cancellation Note  Patient Details Name: [name] MRN: [id number] DOB: 17-May-1946   Cancelled Treatment:    Reason Eval/Treat Not Completed: Medical issues which prohibited therapy. Remains on BiPAP, will follow-up for PT evaluation as schedule permits.  Mabeline Caras, PT, DPT Acute Rehabilitation Services  Pager [phone] Office [place] 07/23/2019, 8:58 AM

## 2019-07-23 NOTE — Progress Notes (Signed)
Patient taken off of bipap and placed on 3 liters of oxygen via [place].  HR 64 and Saturation 96%

## 2019-07-23 NOTE — Care Management (Signed)
CM acknowledges consult to discuss hospice homes with pts daughter.  CM reached out to [facility].  CM explained role of discharge planner and gave a brief overview of residential hospice.  Pts daughter is not ready to select an agency however is interested in  [place] providing more information.  Daugther interested mostly in the option of residential hospice and to gain insight of the process/environment.  CM spoke with Venia Carbon of [facility] will follow up with daughter directly and then with TOC.

## 2019-07-23 NOTE — Progress Notes (Signed)
Initial Nutrition Assessment  DOCUMENTATION CODES:   Not applicable  INTERVENTION:   Tube Feeding:  Jevity 1.5 at 60 ml/hr Provides 2160 kcals, 90 g of protein and 1094 mL   Additional free water flushes 250 mL q 6 hours: total of 2094 mL   NUTRITION DIAGNOSIS:   Inadequate oral intake related to dysphagia as evidenced by NPO status.  GOAL:   Patient will meet greater than or equal to 90% of their needs  MONITOR:   TF tolerance, Labs, Weight trends, Diet advancement  REASON FOR ASSESSMENT:   New TF    ASSESSMENT:   74 yo male admitted with acute on chronic respiratory failure with suspected aspiration pneumonia as well as acute on chronic CHF. Pt with recent CVA with residual weakness and dysphagia, has G-tube. PMH includes DM, MI, CVA, CHF  RD working remotely.  BiPap in place; SLP has been consulted for swallow eval but pt not appropriate at this time given respiratory status. NPO  Jevity 1.5 ordered yesterday by MD; infusing at 40 ml/hr. Free water 200 mL q 6 hours per G-tube  No pressure injuries per RN skin assessment  Current wt 95 kg; no weight loss trend noted per weight encounters  Labs: reviewed Meds: lasix  Diet Order:   Diet Order            Diet NPO time specified  Diet effective now              EDUCATION NEEDS:   No education needs have been identified at this time  Skin:  Skin Assessment: Skin Integrity Issues: Skin Integrity Issues:: Other (Comment) Other: MASD: buttock, groin  Last BM:  2/11  Height:   Ht Readings from Last 1 Encounters:  07/21/19 5\' 9"  (1.753 m)    Weight:   Wt Readings from Last 1 Encounters:  07/21/19 95.1 kg    BMI:  There is no height or weight on file to calculate BMI.  Estimated Nutritional Needs:   Kcal:  2000-2200 kcals  Protein:  100-110 g  Fluid:  >/.= 2 L  Kerman Passey MS, RDN, LDN, CNSC RD Pager Number and Weekend/On-Call After Hours Pager Located in [place]

## 2019-07-23 NOTE — Progress Notes (Signed)
[facility]) Hospital Liaison: RN note     Attempted contact with daughter, [name] to explain services. Left voicemail. [place] Liaison will follow up over weekend.   Farrel Gordon, RN, [facility] (listed on AMION under Hospice and [place] [facility] [place])   [phone]

## 2019-07-23 NOTE — Progress Notes (Signed)
WOB increased, RR 31, Oxygen saturation 96% on [place]. Bipap reapplied, WOB decreases and RR returns to normal, 20 bpm within minutes. Will continue to monitor.

## 2019-07-23 NOTE — Progress Notes (Signed)
SLP Cancellation Note  Patient Details Name: Levi Pruitt MRN: [id number] DOB: [birth year]   Cancelled treatment:        Received order for swallow eval. Spoke with RN- pt still on Bipap from yesterday. Not ready for swallow assessment. Known to ST services most recently on CIR. Will continue to follow for readiness. This therapist will be at [facility] this afternoon. Should he become appropriate for eval, please call rehab department for therapist at [phone].    [name] 07/23/2019, 8:47 AM

## 2019-07-23 NOTE — Consult Note (Signed)
Consultation Note Date: 07/23/2019   Patient Name: Levi Pruitt  DOB: Feb 24, 1946  MRN: [id number]  Age / Sex: 74 y.o., male  PCP: Patient, No Pcp Per Referring Physician: Dessa Phi, DO  Reason for Consultation: Establishing goals of care  HPI/Patient Profile:  Per Hospitalist Note --> Levi Pruitt is a69 y.o.malewith medical history significant ofrecent CVA with right sided hemiparesis/dysphagia requiring PEG tube placement and complicated by inferior wall MI with subsequent discharge to CIR on 07/07/2019. While in inpatient rehab, patient'scondition deteriorated with worsening shortness of breathwith atrial fibrillation with RVR and hypotension on 2/4. Chest x-ray noted worsening infiltrates he was givenintravenous Lasix and readmitted into the hospital.His hospital course was complicated by waxing and waning mental status. He was continued on BiPAP,given IV Lasix, and empiric antibiotics for suspected recurrent aspiration. Cardiology was consulted due to the patient being in atrial fibrillation with hypotension,and hewas started on IV amiodarone. Cardiology adjusted medications of Lasix and amiodarone via PEG. On 2/8his mental status deteriorated and he was foundto have alarge subacute left PCA infarct and small subacute cortical infarcts in left frontal lobe. Right upper extremity doppler done due to swelling revealed an acute thrombus in the cephalic vein and antecubital site likely due to DVT. At that time it was recommended that he continue on dual antiplatelet therapy. He was able to be weaned off BiPAP down to nasal cannula oxygen and recommended to wear BiPAP at night for hypercapnic respiratory failure. Palliative care have been consulted due to patient's poor overall prognosis and family elected for no intubation. He medically optimized and cleared to resume his rehab course and  admitted back to inpatient rehab 2/10.Overnight, the patient was on BiPAP, but this morning when it was removed, he had developed acute respiratory distress with increased work of breathing. Patient was placed on BiPAP for work of breathing and transferred back to the hospital.   Palliative care has been actively involved in Levi Pruitt case since January. It appears that he has made multiple attempts at [facility] rehabilitation though it has not been successful due to ongoing respiratory episodes.   Clinical Assessment and Goals of Care: I have reviewed medical records including EPIC notes, labs and imaging, received report from bedside RN, assessed the patient. Patient is confused upon assessment. He is unable to track our conversation. Appears to be quite tachypneic.    Met with Levi Pruitt, patients daughter and Levi Pruitt at bedside to further discuss diagnosis prognosis, GOC, EOL wishes, disposition and options. I was joined by Levi Pruitt who knows this patient well from his recurrent readmissions to their services.    I introduced Palliative Medicine as specialized medical care for people living with serious illness. It focuses on providing relief from the symptoms and stress of a serious illness. The goal is to improve quality of life for both the patient and the family.  Asked Levi Pruitt how things had been going since her last conversation with Palliative care. Levi shared that she and her family were hoping to give  Levi Pruitt a "fighting chance" by sending him back to CIR as they believe that this is what he would have wanted. She states that every time she asks him what he would like to do he says to "stay the course."  Shared concern over patients recurrent aspirations. Levi said that it's hard for her because for things like cancer there are many prognostic numbers to back up the decision to choose hospice. She said that she is familiar with hospice as they had to  do it for her mother. She does not feel like it is as clear cut with her father and his aspiration episodes. I shared that mortality risk after recurrent aspiration pneumonias is poor looking at various studies from 3 month to one year time periods post event. We talked about each time he is acutely hospitalized that it causes more deconditioning and mental deterioration from delirium.   Levi Pruitt (PT) was able to share her concerns knowing Levi Pruitt from these last few weeks. Discussed these concerns circulated around whether or not he could get to a safe point from a respiratory perspective to safely work with physical therapy, at the present time is does not seem so. Even transfer to SNF would be difficult due to the lack of imminent medical care they could provide there for his tenuous respiratory state.   We talked about options excluding CIR as this is no longer a feasible reality. SNF seems less ideal as well. Presented the idea of hospice. Levi asked what hospice and hospice homes look like in these instances. We discussed the variations of home hospice versus a convalescent hospice. Talked about comfort care institution and how this translates in the hospital setting.   Discussed with patient the importance of continued conversation with family and their  medical providers regarding overall plan of care and treatment options, ensuring decisions are within the context of the patients values and GOCs.  Palliative care will continue to remain involved and check in over the weekend to offer support.   Decision Maker: Levi Pruitt [phone] Levi Pruitt [phone]   SUMMARY OF RECOMMENDATIONS   Continue to have ongoing discussions with patients family regarding poor prognosis  TOC - Referral for hospice home  Plan to check in on daily, trying to arrange a virtual family meeting  Code Status/Advance Care Planning:  Partial code, DNI  Symptom Management:   Per primary  Palliative  Prophylaxis:   Aspiration, Bowel Regimen, Delirium Protocol, Eye Care, Frequent Pain Assessment, Oral Care, Palliative Wound Care and Turn Reposition  Additional Recommendations (Limitations, Scope, Preferences):  Full Scope Treatment  Psycho-social/Spiritual:   Desire for further Chaplaincy support:no  Additional Recommendations: Caregiving  Support/Resources and Education on Hospice  Prognosis:   < 6 months recurrent aspiration events, inability to protect airway.  Discharge Planning: To Be Determined      Primary Diagnoses: Present on Admission: . Acute on chronic respiratory failure with hypoxia and hypercapnia (HCC) . Transaminitis . Thrombosis of right cephalic vein   I have reviewed the medical record, interviewed the patient and family, and examined the patient. The following aspects are pertinent.  Past Medical History:  Diagnosis Date  . 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 Marital status: Widowed    Spouse name: Not on file  . Number of children: Not on file  . Years of education: Not on file  . Highest education level: Not on file  Occupational History  .  Occupation: Lawer, semi-retired.  Tobacco Use  . Smoking status: Never Smoker  . Smokeless tobacco: Never Used  Substance and Sexual Activity  . Alcohol use: Never  . Drug use: Never  . Sexual activity: Not on file  Other Topics Concern  . Not on file  Social History Narrative   Staying home most of the time, does not exercise.    Social Determinants of Health   Financial Resource Strain:   . Difficulty of Paying Living Expenses: Not on file  Food Insecurity:   . Worried About Charity fundraiser in the Last Year: Not on file  . Ran Out of Food in the Last Year: Not on file  Transportation Needs:   . Lack of Transportation (Medical): Not on file  . Lack of Transportation (Non-Medical): Not on file  Physical  Activity:   . Days of Exercise per Week: Not on file  . Minutes of Exercise per Session: Not on file  Stress:   . Feeling of Stress : Not on file  Social Connections:   . Frequency of Communication with Friends and Family: Not on file  . Frequency of Social Gatherings with Friends and Family: Not on file  . Attends Religious Services: Not on file  . Active Member of Clubs or Organizations: Not on file  . Attends Archivist Meetings: Not on file  . Marital Status: Not on file   Family History  Problem Relation Age of Onset  . Heart disease Father   . High blood pressure Sister        is his twin   Scheduled Meds: . amiodarone  200 mg Per Tube BID  . aspirin  324 mg Per Tube Daily  . atorvastatin  80 mg Per Tube q1800  . clopidogrel  75 mg Per Tube Daily  . enoxaparin  40 mg Subcutaneous Q24H  . free water  250 mL Per Tube Q6H  . furosemide  20 mg Intravenous BID  . insulin aspart  0-9 Units Subcutaneous Q4H  . methylphenidate  5 mg Oral BID WC  . sodium chloride flush  3 mL Intravenous Q12H   Continuous Infusions: . azithromycin 500 mg (08/05/2019 1843)  . cefTRIAXone (ROCEPHIN)  IV 2 g (08/04/2019 1836)  . feeding supplement (JEVITY 1.5 CAL/FIBER) 1,000 mL (07/23/19 0936)   PRN Meds:.acetaminophen **OR** acetaminophen, ipratropium-albuterol, ondansetron **OR** ondansetron (ZOFRAN) IV Medications Prior to Admission:  Prior to Admission medications   Medication Sig Start Date End Date Taking? Authorizing Provider  amiodarone (PACERONE) 200 MG tablet Place 1 tablet (200 mg total) into feeding tube 2 (two) times daily. 07/21/19   Aline August, MD  aspirin 81 MG chewable tablet Place 4 tablets (324 mg total) into feeding tube daily. 07/08/19   Dessa Phi, DO  atorvastatin (LIPITOR) 80 MG tablet Place 1 tablet (80 mg total) into feeding tube daily at 6 PM. 07/07/19   Dessa Phi, DO  clopidogrel (PLAVIX) 75 MG tablet Place 1 tablet (75 mg total) into feeding tube  daily. 07/07/19   Dessa Phi, DO  enoxaparin (LOVENOX) 40 MG/0.4ML injection Inject 0.4 mLs (40 mg total) into the skin daily. 07/15/19   Angiulli, Lavon Paganini, PA-C  furosemide (LASIX) 40 MG tablet Place 0.5 tablets (20 mg total) into feeding tube daily. 07/21/19   Aline August, MD  insulin aspart (NOVOLOG) 100 UNIT/ML injection Inject 0-9 Units into the skin every 4 (four) hours. 07/15/19   Angiulli, Lavon Paganini, PA-C  ipratropium-albuterol (DUONEB) 0.5-2.5 (3)  MG/3ML SOLN Take 3 mLs by nebulization every 6 (six) hours as needed. 07/15/19   Angiulli, Lavon Paganini, PA-C  Melatonin 3 MG TABS Take 2 tablets (6 mg total) by mouth at bedtime as needed (insomnia). 07/15/19   Angiulli, Lavon Paganini, PA-C  methylphenidate (RITALIN) 5 MG tablet Take 1 tablet (5 mg total) by mouth 2 (two) times daily with breakfast and lunch. 07/15/19   Angiulli, Lavon Paganini, PA-C  Nutritional Supplements (FEEDING SUPPLEMENT, JEVITY 1.5 CAL/FIBER,) LIQD Take 1,000 mLs by mouth continuous. 07/15/19   Angiulli, Lavon Paganini, PA-C  Water For Irrigation, Sterile (FREE WATER) SOLN Place 200 mLs into feeding tube every 6 (six) hours. 07/21/19   Aline August, MD   No Known Allergies Review of Systems  Unable to perform ROS  Physical Exam Vitals and nursing note reviewed.  Constitutional:      Comments: Elderly ill appearing M  HENT:     Head: Normocephalic.     Nose: Nose normal.     Mouth/Throat:     Mouth: Mucous membranes are dry.  Eyes:     Pupils: Pupils are equal, round, and reactive to light.  Cardiovascular:     Rate and Rhythm: Normal rate and regular rhythm.  Pulmonary:     Effort: Pulmonary effort is normal.  Abdominal:     General: Abdomen is flat.     Palpations: Abdomen is soft.  Musculoskeletal:     Cervical back: Normal range of motion.     Comments: Generalized weakness, loss of tone  Skin:    General: Skin is dry.     Capillary Refill: Capillary refill takes less than 2 seconds.  Neurological:     Mental Status: He is  disoriented.  Psychiatric:     Comments: Not making sense, cannot follow conversation with sufficient recall    Vital Signs: BP (!) 110/57 (BP Location: Right Arm)   Pulse 67   Temp 98.7 F (37.1 C) (Oral)   Resp (!) 31   SpO2 98%  Pain Scale: Faces POSS *See Group Information*: 1-Acceptable,Awake and alert Pain Score: 0-No pain  SpO2: SpO2: 98 % O2 Device:SpO2: 98 % O2 Flow Rate: .O2 Flow Rate (L/min): 3 L/min  IO: Intake/output summary:   Intake/Output Summary (Last 24 hours) at 07/23/2019 1514 Last data filed at 07/23/2019 0600 Gross per 24 hour  Intake 790 ml  Output 20 ml  Net 770 ml   LBM: Last BM Date: 07/18/2019 Baseline Weight:   Most recent weight:       Palliative Assessment/Data: 10%   Time In: 1430 Time Out: 1540 Time Total: 70 Greater than 50%  of this time was spent counseling and coordinating care related to the above assessment and plan.  Signed by: Rosezella Rumpf, NP   Please contact Palliative Medicine Team phone at [phone] for questions and concerns.  For individual provider: See Shea Evans

## 2019-07-24 LAB — HEPATIC FUNCTION PANEL
ALT: 215 U/L — ABNORMAL HIGH (ref 0–44)
AST: 120 U/L — ABNORMAL HIGH (ref 15–41)
Albumin: 2.1 g/dL — ABNORMAL LOW (ref 3.5–5.0)
Alkaline Phosphatase: 109 U/L (ref 38–126)
Bilirubin, Direct: <0.1 mg/dL (ref 0.0–0.2)
Total Bilirubin: 0.3 mg/dL (ref 0.3–1.2)
Total Protein: 5.5 g/dL — ABNORMAL LOW (ref 6.5–8.1)

## 2019-07-24 LAB — BASIC METABOLIC PANEL WITH GFR
Anion gap: 6 (ref 5–15)
BUN: 27 mg/dL — ABNORMAL HIGH (ref 8–23)
CO2: 33 mmol/L — ABNORMAL HIGH (ref 22–32)
Calcium: 8 mg/dL — ABNORMAL LOW (ref 8.9–10.3)
Chloride: 101 mmol/L (ref 98–111)
Creatinine, Ser: 1.16 mg/dL (ref 0.61–1.24)
GFR calc Af Amer: >60 mL/min
GFR calc non Af Amer: >60 mL/min
Glucose, Bld: 144 mg/dL — ABNORMAL HIGH (ref 70–99)
Potassium: 3.8 mmol/L (ref 3.5–5.1)
Sodium: 140 mmol/L (ref 135–145)

## 2019-07-24 LAB — GLUCOSE, CAPILLARY
Glucose-Capillary: 116 mg/dL — ABNORMAL HIGH (ref 70–99)
Glucose-Capillary: 127 mg/dL — ABNORMAL HIGH (ref 70–99)
Glucose-Capillary: 120 mg/dL — ABNORMAL HIGH (ref 70–99)
Glucose-Capillary: 107 mg/dL — ABNORMAL HIGH (ref 70–99)
Glucose-Capillary: 138 mg/dL — ABNORMAL HIGH (ref 70–99)
Glucose-Capillary: 92 mg/dL (ref 70–99)

## 2019-07-24 LAB — CBC
HCT: 36.8 % — ABNORMAL LOW (ref 39.0–52.0)
Hemoglobin: 11.6 g/dL — ABNORMAL LOW (ref 13.0–17.0)
MCH: 30.9 pg (ref 26.0–34.0)
MCHC: 31.5 g/dL (ref 30.0–36.0)
MCV: 97.9 fL (ref 80.0–100.0)
Platelets: 262 10*3/uL (ref 150–400)
RBC: 3.76 MIL/uL — ABNORMAL LOW (ref 4.22–5.81)
RDW: 14.2 % (ref 11.5–15.5)
WBC: 12.1 10*3/uL — ABNORMAL HIGH (ref 4.0–10.5)
nRBC: 0 % (ref 0.0–0.2)

## 2019-07-24 NOTE — Progress Notes (Addendum)
   Palliative Medicine Inpatient Follow Up Note   Addendum to earlier progress not. Family meeting held at 47 with patients children Arturo Morton, and his partner [name]. We discussed his current health state and poor progression in the hospital. Per his living will he would not want any life prolonging interventions. I shared with his family that continued use of bipap and g-tube feedings are considered exactly this. We talked extensively about his aspiration events and his worsening respiratory state. We discussed weakness post CVA and the effect that can have on a whole body system.   Joycelyn Schmid shared many thoughtful questions regarding his overall clinical picture. She was having a harder time accepting his present health state.  We discussed delirium in the setting of his acute hospitalization. We discussed how this can be used as a prognosticator in many cases for poor clinical outcomes.  Much of our conversations circulated around if the family was to choose to send him to SNF what clinical stability would need to look like for transfer. Shared that he likely would not fair well given that he recently had two stints at rehabilitation that ended similarly back in the acute care setting.   We discussed what comfort care in the hospital looks like inclusive of removing unnecessary treatments. Talked about the change of focus from aggressive treatment to a holistic patient approach of symptom relief. Shared information on hospice and their ideals. We talked more about transition to a hospice home and how this typically indicates death is more immanent.  Discussed with patient the importance of continued conversation with family and their  medical providers regarding overall plan of care and treatment options, ensuring decisions are within the context of the patients values and GOCs.  The family was able to listen and ask all necessary questions. They shared that they will need more  time to discuss all of this as a family before reaching any decisions.   We will continue to follow and offer help as able.   SUMMARY OF RECOMMENDATIONS   Continue to have ongoing discussions with patients family regarding poor prognosis  Family not presently ready to make a decisions regarding hospice  Time Started: 1400 Time Ended: 1505 Time Spent: 65  Greater than 50% of the time was spent in counseling and coordination of care ______________________________________________________________________________________ [facility] [place] Team Team Cell Phone: [phone] Please utilize secure chat with additional questions, if there is no response within 30 minutes please call the above phone number  Palliative Medicine Team providers are available by phone from 7am to 7pm daily and can be reached through the team cell phone.  Should this patient require assistance outside of these hours, please call the patient's attending physician.

## 2019-07-24 NOTE — Progress Notes (Signed)
PROGRESS NOTE    Levi Pruitt  [id number] DOB: 16-Mar-1946 DOA: 07/28/2019 PCP: Patient, No Pcp Per     Brief Narrative:  Levi Pruitt is a 74 y.o. male with medical history significant of recent CVA with right sided hemiparesis/dysphagia requiring PEG tube placement and complicated by inferior wall MI with subsequent discharge to CIR on 07/07/2019.  While in inpatient rehab, patient's condition deteriorated with worsening shortness of breath with atrial fibrillation with RVR and hypotension on 2/4.  Chest x-ray noted worsening infiltrates he was given intravenous Lasix and readmitted into the hospital.  His hospital course was complicated by waxing and waning mental status.  He was continued on BiPAP, given IV Lasix, and empiric antibiotics for suspected recurrent aspiration.  Cardiology was consulted due to the patient being in atrial fibrillation with hypotension, and he was started on IV amiodarone.  Cardiology adjusted medications of Lasix and amiodarone via PEG.  On 2/8 his mental status deteriorated and he was found to have a large subacute left PCA infarct and small subacute cortical infarcts in left frontal lobe.  Right upper extremity doppler done due to swelling revealed an acute thrombus in the cephalic vein and antecubital site likely due to DVT.  At that time it was recommended that he continue on dual antiplatelet therapy.  He was able to be weaned off BiPAP down to nasal cannula oxygen and recommended to wear BiPAP at night for hypercapnic respiratory failure.  Palliative care have been consulted due to patient's poor overall prognosis and family elected for no intubation.  He medically optimized and cleared to resume his rehab course and admitted back to inpatient rehab 2/10.  Overnight, the patient was on BiPAP, but this morning when it was removed, he had developed acute respiratory distress with increased work of breathing.  Patient was placed on BiPAP for work of breathing and  transferred back to the hospital.   New events last 24 hours / Subjective: Back on BiPAP yesterday evening due to increase in RR. Appears calm and comfortable on BiPAP this morning. Noted plans for family conference with palliative care this afternoon.   Assessment & Plan:   Principal Problem:   Acute on chronic respiratory failure with hypoxia and hypercapnia (HCC) Active Problems:   CHF (congestive heart failure) (HCC)   Dysphagia, post-stroke   Transaminitis   History of CVA with residual deficit   History of myocardial infarction   Thrombosis of right cephalic vein   Goals of care, counseling/discussion   Do not intubate but use all other measures   Acute on chronic respiratory failure with hypercapnia and hypoxia, suspected aspiration pneumonia as well as acute on chronic combined systolic and diastolic heart failure -Patient readmitted into the hospital after developing acute respiratory distress.  Placed on BiPAP.  He had been using BiPAP nightly most recently.  -Chest x-ray showing persistent bilateral opacities with slight increase in the left upper lobe concerning for multifocal pneumonia.  It appears previously symptoms were related to a combination of aspiration and CHF. -Last EF noted to be 30 to 35% with grade 3 diastolic dysfunction on [id number].  -BNP 990.5 -Procalcitonin 0.82 -Continue Rocephin and azithromycin  -Continue IV Lasix -Back on BiPAP this morning   Paroxysmal atrial fibrillation -Continue on amiodarone 200 mg twice daily x1 week, then once twice daily x1 week, and then discontinue.  Anticoagulation had not been recommended by cardiology.  Acute kidney injury -Cr stable. Continue to monitor closely while on IV Lasix  Recent CVA  with with residual weakness and dysphagia -CT scan of the brain on 07/19/2019 showed baseline atrophy, subacute large PCA/small subcortical infarct with petechial hemorrhage and/or cortical laminar necrosis noted within  portions of the left parieto-occipital infarction territory. -Continue dual antiplatelet therapy and statin -PT/OT/speech therapy to eval and treat -Continue tube feeds per PEG  Recent acute inferior STEMI -Patient was not a candidate for any cardiac intervention at that time  Elevated liver enzymes -Patient noted during his previous hospitalization to have elevation in his liver enzymes that was thought possibly related to sepsis versus congestion.  Lipitor had been continued due to recent stroke -Continue to monitor, improving   Severe carotid stenosis, PCA, vertebral stenosis -Previously recommended for outpatient follow-up with vascular surgery.  Recommendations including avoid low blood pressures. -Continue aspirin, Plavix, and statin  8 mm parotid neoplasm -Outpatient follow-up with ENT once acute issues resolved  Right cephalic vein thrombosis -Doppler ultrasound revealed the cephalic vein thrombus on 2/11.  Thought to be superficial in nature related to the IV.  Not to require anticoagulation.  Diabetes mellitus type 2 -Hemoglobin A1c 5.7 on 07/15/2019 -CBGs every 4 hours with sensitive SSI    DVT prophylaxis: Lovenox Code Status: DNI Family Communication: None at bedside Disposition Plan:   Patient is from home and most recently CIR prior to admission.  Currently in-hospital treatment needed due to respiratory failure requiring BiPAP.   Suspect patient will discharge to SNF vs hospice pending palliative care discussion.    Consultants:   Palliative care medicine  Procedures:   None   Antimicrobials:  Anti-infectives (From admission, onward)   Start     Dose/Rate Route Frequency Ordered Stop   07/24/2019 1800  cefTRIAXone (ROCEPHIN) 2 g in sodium chloride 0.9 % 100 mL IVPB     2 g 200 mL/hr over 30 Minutes Intravenous Every 24 hours 07/27/2019 1745     07/23/2019 1800  azithromycin (ZITHROMAX) 500 mg in sodium chloride 0.9 % 250 mL IVPB     500 mg 250  mL/hr over 60 Minutes Intravenous Every 24 hours 08/06/2019 1749         Objective: Vitals:   07/24/19 0259 07/24/19 0340 07/24/19 0836 07/24/19 0845  BP:  (!) 98/55  (!) 96/56  Pulse: 65 69 68   Resp: (!) 22 (!) 21 18   Temp:  98.1 F (36.7 C)    TempSrc:  Axillary    SpO2:  97% 98%   Weight:  91.3 kg      Intake/Output Summary (Last 24 hours) at 07/24/2019 0938 Last data filed at 07/24/2019 0500 Gross per 24 hour  Intake --  Output 1300 ml  Net -1300 ml   Filed Weights   07/24/19 0340  Weight: 91.3 kg    Examination: General exam: Appears calm and comfortable  Respiratory system: Clear to auscultation anteriorly. Comfortable on BiPAP this morning  Cardiovascular system: S1 & S2 heard, RRR. No pedal edema. Gastrointestinal system: Abdomen is nondistended, soft and nontender. Normal bowel sounds heard. Extremities: Symmetric in appearance bilaterally  Skin: No rashes, lesions or ulcers on exposed skin     Data Reviewed: I have personally reviewed following labs and imaging studies  CBC: Recent Labs  Lab 07/20/19 0256 07/21/19 0353 07/31/2019 0614 07/23/19 0344 07/24/19 0405  WBC 13.3* 11.5* 14.1* 12.1* 12.1*  NEUTROABS  --   --  10.8*  --   --   HGB 12.9* 12.3* 12.1* 11.8* 11.6*  HCT 42.0 39.5 39.1 38.2* 36.8*  MCV 100.2* 98.5 99.0 99.2 97.9  PLT 305 284 310 265 [id number]   Basic Metabolic Panel: Recent Labs  Lab 07/18/19 0320 07/19/19 0337 07/20/19 0256 07/20/19 0256 07/20/19 1828 07/21/19 0353 07/26/2019 0614 07/23/19 0344 07/24/19 0405  NA 146*   < > 146*  --   --  147* 144 143 140  K 3.6   < > 3.2*   < > 4.2 4.0 3.8 3.7 3.8  CL 100   < > 99  --   --  104 96* 99 101  CO2 34*   < > 37*  --   --  33* 33* 32 33*  GLUCOSE 168*   < > 107*  --   --  146* 136* 130* 144*  BUN 33*   < > 35*  --   --  34* 32* 29* 27*  CREATININE 1.26*   < > 1.20  --   --  1.22 1.28* 1.08 1.16  CALCIUM 8.3*   < > 8.5*  --   --  8.5* 8.8* 8.3* 8.0*  MG 2.4  --  2.3  --   --   --    --   --   --    < > = values in this interval not displayed.   GFR: Estimated Creatinine Clearance: 63.3 mL/min (by C-G formula based on SCr of 1.16 mg/dL). Liver Function Tests: Recent Labs  Lab 07/19/19 0337 07/20/19 0256 07/21/19 0353 07/12/2019 0614 07/24/19 0405  AST 95* 177* 200* 153* 120*  ALT 155* 220* 283* 267* 215*  ALKPHOS 125 109 113 115 109  BILITOT 0.5 0.4 0.5 0.7 0.3  PROT 6.1* 5.9* 5.6* 5.9* 5.5*  ALBUMIN 2.4* 2.3* 2.3* 2.4* 2.1*   No results for input(s): LIPASE, AMYLASE in the last 168 hours. No results for input(s): AMMONIA in the last 168 hours. Coagulation Profile: No results for input(s): INR, PROTIME in the last 168 hours. Cardiac Enzymes: No results for input(s): CKTOTAL, CKMB, CKMBINDEX, TROPONINI in the last 168 hours. BNP (last 3 results) No results for input(s): PROBNP in the last 8760 hours. HbA1C: No results for input(s): HGBA1C in the last 72 hours. CBG: Recent Labs  Lab 07/23/19 1934 07/23/19 1947 07/23/19 2323 07/24/19 0338 07/24/19 0756  GLUCAP 98 111* 143* 116* 127*   Lipid Profile: No results for input(s): CHOL, HDL, LDLCALC, TRIG, CHOLHDL, LDLDIRECT in the last 72 hours. Thyroid Function Tests: No results for input(s): TSH, T4TOTAL, FREET4, T3FREE, THYROIDAB in the last 72 hours. Anemia Panel: No results for input(s): VITAMINB12, FOLATE, FERRITIN, TIBC, IRON, RETICCTPCT in the last 72 hours. Sepsis Labs: Recent Labs  Lab 07/18/19 0320 08/04/2019 1158  PROCALCITON  --  0.82  LATICACIDVEN 1.2  --     Recent Results (from the past 240 hour(s))  Culture, blood (routine x 2)     Status: None   Collection Time: 07/15/19  6:52 AM   Specimen: BLOOD  Result Value Ref Range Status   Specimen Description BLOOD RIGHT ANTECUBITAL  Final   Special Requests   Final    BOTTLES DRAWN AEROBIC ONLY Blood Culture results may not be optimal due to an inadequate volume of blood received in culture bottles   Culture   Final    NO GROWTH 5  DAYS Performed at [facility], [place] [address], [place], [place] [address]    Report Status 07/20/2019 FINAL  Final  Culture, blood (routine x 2)     Status: None  Collection Time: 07/15/19  7:01 AM   Specimen: BLOOD RIGHT ARM  Result Value Ref Range Status   Specimen Description BLOOD RIGHT ARM  Final   Special Requests   Final    BOTTLES DRAWN AEROBIC ONLY Blood Culture adequate volume   Culture   Final    NO GROWTH 5 DAYS Performed at [facility], [address], [place], [place] [address]    Report Status 07/20/2019 FINAL  Final  SARS CORONAVIRUS 2 (TAT 6-24 HRS) Nasopharyngeal Nasopharyngeal Swab     Status: None   Collection Time: 07/15/19  9:13 AM   Specimen: Nasopharyngeal Swab  Result Value Ref Range Status   SARS Coronavirus 2 NEGATIVE NEGATIVE Final    Comment: (NOTE) SARS-CoV-2 target nucleic acids are NOT DETECTED. The SARS-CoV-2 RNA is generally detectable in upper and lower respiratory specimens during the acute phase of infection. Negative results do not preclude SARS-CoV-2 infection, do not rule out co-infections with other pathogens, and should not be used as the sole basis for treatment or other patient management decisions. Negative results must be combined with clinical observations, patient history, and epidemiological information. The expected result is Negative. Fact Sheet for Patients: SugarRoll.be Fact Sheet for Healthcare Providers: https://www.woods-mathews.com/ This test is not yet approved or cleared by the [place] FDA and  has been authorized for detection and/or diagnosis of SARS-CoV-2 by FDA under an Emergency Use Authorization (EUA). This EUA will remain  in effect (meaning this test can be used) for the duration of the COVID-19 declaration under Section 56 4(b)(1) of the Act, 21 U.S.C. section 360bbb-3(b)(1), unless the authorization is terminated or revoked sooner. Performed at  [facility], [place] [address], [place], [place] [address]   MRSA PCR Screening     Status: None   Collection Time: 07/16/19 12:05 PM   Specimen: Nasal Mucosa; Nasopharyngeal  Result Value Ref Range Status   MRSA by PCR NEGATIVE NEGATIVE Final    Comment:        The GeneXpert MRSA Assay (FDA approved for NASAL specimens only), is one component of a comprehensive MRSA colonization surveillance program. It is not intended to diagnose MRSA infection nor to guide or monitor treatment for MRSA infections. Performed at [facility], [place] [address], [place], [place] [address]       Radiology Studies: DG Chest 1 View  Result Date: 08/08/2019 CLINICAL DATA:  Dyspnea, history stroke, MI, CHF, atrial fibrillation EXAM: CHEST  1 VIEW COMPARISON:  Portable exam 0943 hours compared to 07/19/2019 FINDINGS: Upper normal heart size. Stable mediastinal contours. BILATERAL pulmonary infiltrates, greater in the upper lobes. Associated volume loss and pleuroparenchymal thickening on RIGHT. Suspect mild progression in LEFT upper lobe since prior study. Minimal RIGHT pleural effusion. No pneumothorax. Bones demineralized. IMPRESSION: Persistent BILATERAL pulmonary infiltrates consistent with multifocal pneumonia, slightly increased in LEFT upper lobe. Electronically Signed   By: Lavonia Dana M.D.   On: 07/23/2019 09:56      Scheduled Meds:  amiodarone  200 mg Per Tube BID   aspirin  324 mg Per Tube Daily   atorvastatin  80 mg Per Tube q1800   clopidogrel  75 mg Per Tube Daily   enoxaparin  40 mg Subcutaneous Q24H   free water  250 mL Per Tube Q6H   furosemide  20 mg Intravenous BID   insulin aspart  0-9 Units Subcutaneous Q4H   methylphenidate  5 mg Oral BID WC   sodium chloride flush  3 mL Intravenous Q12H   Continuous Infusions:  azithromycin 500 mg (07/23/19 1807)   cefTRIAXone (ROCEPHIN)  IV 2 g (07/23/19 1705)   feeding supplement (JEVITY 1.5 CAL/FIBER) 1,000 mL  (07/23/19 0936)     LOS: 2 days      Time spent: 25 minutes   Dessa Phi, DO [facility] 07/24/2019, 9:38 AM   Available via Epic secure chat 7am-7pm After these hours, please refer to coverage provider listed on [email or web address]

## 2019-07-24 NOTE — IPOC Note (Signed)
Due to transfer to acute ~24 hr post admit no IPOC completed

## 2019-07-24 NOTE — Progress Notes (Signed)
OT Cancellation Note  Patient Details Name: Levi Pruitt MRN: [id number] DOB: 1945/08/22   Cancelled Treatment:    Reason Eval/Treat Not Completed: Medical issues which prohibited therapy; pt remains on bipap this AM, will follow up for OT evaluation as schedule permits and as pt is medically appropriate.  Lou Cal, OT Supplemental Rehabilitation Services Pager [phone] Office [phone]   Raymondo Band 07/24/2019, 9:36 AM

## 2019-07-24 NOTE — Progress Notes (Signed)
Palliative Medicine Inpatient Follow Up Note   HPI: Per Hospitalist Note --> Levi Hamletis a73 y.o.malewith medical history significant ofrecent CVA with right sided hemiparesis/dysphagia requiring PEG tube placement and complicated by inferior wall MI with subsequent discharge to CIR on 07/07/2019. While in inpatient rehab,patient'scondition deteriorated with worsening shortness of breathwith atrial fibrillation with RVR and hypotension on 2/4. Chest x-ray noted worsening infiltrates he was givenintravenous Lasix and readmitted into the hospital.His hospital course was complicated by waxing and waning mental status. He was continued on BiPAP,given IV Lasix, and empiric antibiotics for suspected recurrent aspiration. Cardiology was consulted due to the patient being in atrial fibrillation with hypotension,and hewas started on IV amiodarone. Cardiology adjusted medications of Lasix and amiodarone via PEG. On 2/8his mental status deteriorated and he was foundto have alarge subacute left PCA infarct and small subacute cortical infarcts in left frontal lobe. Right upper extremity doppler done due to swelling revealed an acute thrombus in the cephalic vein and antecubital site likely due to DVT. At that time it was recommended that he continue on dual antiplatelet therapy. He was able to be weaned off BiPAP down to nasal cannula oxygen and recommended to wear BiPAP at night for hypercapnic respiratory failure. Palliative care have been consulted due to patient's poor overall prognosis and family elected for no intubation. He medically optimized and cleared to resume his rehab course and admitted back to inpatient rehab2/10.Overnight,the patient was on BiPAP, but this morning when it was removed, hehad developed acute respiratory distress with increased work of breathing. Patient was placed on BiPAP for work of breathingand transferred back to the hospital.  2/12 - Palliative  care has been actively involved in Levi Pruitt case since January. It appears that he has made multiple attempts at [facility] rehabilitation though it has not been successful due to ongoing respiratory episodes.   2/12 Levi Pruitt (patients significant other) having a hard time relaying patients poor physical state to other family members.   Today's Discussion (07/24/2019): Chart reviewed. Patient is sleeping on Bipap upon assessment. Have spoke to his long time girlfriend of over twenty years, Levi Pruitt. Levi Pruitt shared that she and Levi Pruitt's family are having some disagreements regarding his care and lack of improvement. Levi Pruitt shares that she has felt he had not been doing well but the patients son is ambivalent to stop treatments. She requested a palliative care conference with the other family members. We plan to have this today at 1400.   Discussed with patient the importance of continued conversation with family and their  medical providers regarding overall plan of care and treatment options, ensuring decisions are within the context of the patients values and GOCs.  Questions and concerns addressed   Vital Signs Vitals:   07/24/19 0836 07/24/19 0845  BP:  (!) 96/56  Pulse: 68   Resp: 18   Temp:    SpO2: 98%     Intake/Output Summary (Last 24 hours) at 07/24/2019 0859 Last data filed at 07/24/2019 0500 Gross per 24 hour  Intake --  Output 1300 ml  Net -1300 ml   Last Weight  Most recent update: 07/24/2019  3:44 AM   Weight  91.3 kg (201 lb 4.5 oz)           Physical Exam Vitals and nursing note reviewed.  Constitutional:      Comments: Elderly ill appearing M  HENT:     Head: Normocephalic.     Nose: Nose normal.     Mouth/Throat:  Mouth: Mucous membranes are dry.  Eyes:     Pupils: Pupils are equal, round, and reactive to light.  Cardiovascular:     Rate and Rhythm: Normal rate and regular rhythm.  Pulmonary:     Effort: Pulmonary effort is normal.  Abdominal:     General:  Abdomen is flat.     Palpations: Abdomen is soft.  Musculoskeletal:     Cervical back: Normal range of motion.     Comments: Generalized weakness, loss of tone  Skin:    General: Skin is dry.     Capillary Refill: Capillary refill takes less than 2 seconds.   SUMMARY OF RECOMMENDATIONS   Continue to have ongoing discussions with patients family regarding poor prognosis  TOC - Referral for hospice home ([facility])  Family meeting at 54 today  Time Spent: 15 Greater than 50% of the time was spent in counseling and coordination of care ______________________________________________________________________________________ [place] Team Team Cell Phone: [phone] Please utilize secure chat with additional questions, if there is no response within 30 minutes please call the above phone number  Palliative Medicine Team providers are available by phone from 7am to 7pm daily and can be reached through the team cell phone.  Should this patient require assistance outside of these hours, please call the patient's attending physician.

## 2019-07-24 NOTE — Evaluation (Signed)
SLP Cancellation Note  Patient Details Name: Levi Pruitt MRN: [id number] DOB: [birth year]   Cancelled Treatment:    Reason Eval/Treat Not Completed: Medical issues which prohibited therapy  Macario Golds 07/24/2019, 9:54 AM   Kathleen Lime, MS [place] Office [phone]

## 2019-07-24 NOTE — Progress Notes (Signed)
PT Cancellation Note  Patient Details Name: Levi Pruitt MRN: [id number] DOB: 1945/09/28   Cancelled Treatment:     Remains on BiPAP.   Wyona Almas, PT, DPT Acute Rehabilitation Services Pager [phone] Office [phone]     Deno Etienne 07/24/2019, 11:59 AM

## 2019-07-25 LAB — GLUCOSE, CAPILLARY
Glucose-Capillary: 102 mg/dL — ABNORMAL HIGH (ref 70–99)
Glucose-Capillary: 120 mg/dL — ABNORMAL HIGH (ref 70–99)
Glucose-Capillary: 124 mg/dL — ABNORMAL HIGH (ref 70–99)
Glucose-Capillary: 130 mg/dL — ABNORMAL HIGH (ref 70–99)
Glucose-Capillary: 147 mg/dL — ABNORMAL HIGH (ref 70–99)
Glucose-Capillary: 148 mg/dL — ABNORMAL HIGH (ref 70–99)

## 2019-07-25 LAB — CBC
HCT: 37.5 % — ABNORMAL LOW (ref 39.0–52.0)
Hemoglobin: 11.9 g/dL — ABNORMAL LOW (ref 13.0–17.0)
MCH: 30.7 pg (ref 26.0–34.0)
MCHC: 31.7 g/dL (ref 30.0–36.0)
MCV: 96.9 fL (ref 80.0–100.0)
Platelets: 278 10*3/uL (ref 150–400)
RBC: 3.87 MIL/uL — ABNORMAL LOW (ref 4.22–5.81)
RDW: 14.4 % (ref 11.5–15.5)
WBC: 13.9 10*3/uL — ABNORMAL HIGH (ref 4.0–10.5)
nRBC: 0 % (ref 0.0–0.2)

## 2019-07-25 LAB — BASIC METABOLIC PANEL
Anion gap: 13 (ref 5–15)
BUN: 25 mg/dL — ABNORMAL HIGH (ref 8–23)
CO2: 32 mmol/L (ref 22–32)
Calcium: 8.3 mg/dL — ABNORMAL LOW (ref 8.9–10.3)
Chloride: 96 mmol/L — ABNORMAL LOW (ref 98–111)
Creatinine, Ser: 1.07 mg/dL (ref 0.61–1.24)
GFR calc Af Amer: >60 mL/min (ref >60)
GFR calc non Af Amer: >60 mL/min (ref >60)
Glucose, Bld: 157 mg/dL — ABNORMAL HIGH (ref 70–99)
Potassium: 3.9 mmol/L (ref 3.5–5.1)
Sodium: 141 mmol/L (ref 135–145)

## 2019-07-25 MED ORDER — LORAZEPAM 2 MG/ML IJ SOLN
0.5000 mg | Freq: Once | INTRAMUSCULAR | Status: AC
  Administered 2019-07-25: 0.5 mg via INTRAVENOUS
  Filled 2019-07-25: qty 1

## 2019-07-25 MED ORDER — MORPHINE SULFATE (PF) 2 MG/ML IV SOLN
1.0000 mg | Freq: Once | INTRAVENOUS | Status: AC
  Administered 2019-07-25: 1 mg via INTRAVENOUS
  Filled 2019-07-25: qty 1

## 2019-07-25 MED ORDER — MORPHINE SULFATE (PF) 2 MG/ML IV SOLN
1.0000 mg | INTRAVENOUS | Status: DC | PRN
  Administered 2019-07-25 – 2019-07-27 (×7): 1 mg via INTRAVENOUS
  Filled 2019-07-25 (×7): qty 1

## 2019-07-25 NOTE — Progress Notes (Signed)
Pt repeatedly removing Bipap mask. Pt placed on 4L [place]. Will continue to monitor.

## 2019-07-25 NOTE — Progress Notes (Addendum)
Palliative Medicine Inpatient Follow Up Note  HPI: Per Hospitalist Note --> Levi Hamletis a73 y.o.malewith medical history significant ofrecent CVA with right sided hemiparesis/dysphagia requiring PEG tube placement and complicated by inferior wall MI with subsequent discharge to CIR on 07/07/2019. While in inpatient rehab,patient'scondition deteriorated with worsening shortness of breathwith atrial fibrillation with RVR and hypotension on 2/4. Chest x-ray noted worsening infiltrates he was givenintravenous Lasix and readmitted into the hospital.His hospital course was complicated by waxing and waning mental status. He was continued on BiPAP,given IV Lasix, and empiric antibiotics for suspected recurrent aspiration. Cardiology was consulted due to the patient being in atrial fibrillation with hypotension,and hewas started on IV amiodarone. Cardiology adjusted medications of Lasix and amiodarone via PEG. On 2/8his mental status deteriorated and he was foundto have alarge subacute left PCA infarct and small subacute cortical infarcts in left frontal lobe. Right upper extremity doppler done due to swelling revealed an acute thrombus in the cephalic vein and antecubital site likely due to DVT. At that time it was recommended that he continue on dual antiplatelet therapy. He was able to be weaned off BiPAP down to nasal cannula oxygen and recommended to wear BiPAP at night for hypercapnic respiratory failure. Palliative care have been consulted due to patient's poor overall prognosis and family elected for no intubation. He medically optimized and cleared to resume his rehab course and admitted back to inpatient rehab2/10.Overnight,the patient was on BiPAP, but this morning when it was removed, hehad developed acute respiratory distress with increased work of breathing. Patient was placed on BiPAP for work of breathingand transferred back to the hospital.  2/12 - Palliative  care has been actively involved in [name] case since January. It appears that he has made multiple attempts at [facility] rehabilitation though it has not been successful due to ongoing respiratory episodes.   2/12 Levi Pruitt (patients significant other) having a hard time relaying patients poor physical state to other family members.   2/13 - Palliative care had a family meeting. Described all of the options moving forward regarding emphasis on therapy versus transition to comfort. Described that patient appears to be more bipap dependant at this phase. Patients son and daughter are grappling with this concept and ask for more time to consider their options.  Today's Discussion (07/25/2019): Chart reviewed. Patient awake and off Bipap though appears to be belly breathing. He is stating that he does not like the bipap and he does not want the bipap. His bedside nurse corroborates that he has been pulling bipap off and refusing to place it back on.   Called patients partner to inform her of this and she stated that if he does not want it we should not push this intervention on him. Levi Pruitt said that she wants to respect his wishes. I again brought up his tenuous health state and the high likelihood for decline which she vocalized understanding of. We discussed his delirious state and how we cannot take everything he says as matter of fact. Levi Pruitt shared her frustrations that his family is sending her videos of him though she shares in feeling that he does not comprehend what he is saying.  We talked about the precarious situation she is placed in with the patient children and the amount of backlash she has been receiving from them in trying to act within the patient best interests. Offered therapeutic listening as emotional support.    Met with patients daughter, Levi Pruitt at bedside. She stated that she was  videotaping her dad and that he would want bipap. Reviewed again his poor mental state for decision making.  She said that she and her family are hopeful to continue bipap for another day to identify if he will improve. I shared that we are truly utilizing bipap as a form of life support at this juncture. Levi Pruitt was very tearful and said that this is what she and her family had presently agreed to pursue.  Called Levi Pruitt later in the morning given patients continued poor respiratory state. She said that Freds children were placing tremendous pressure on her to revoke his DNI and have him intubated. We discussed how this would likely not be of benefit to the patient and how often once we pursue certain interventions it can make decisions more difficult for the family. Offered that we are here to support whatever decisions she as the decision maker apoints. She shared that perhaps this is why [name] appointed her as his Media planner, out of the fear that his children would have such a hard time with his potential loss. She again states that Levi Pruitt would never want to be intubated. She said that she would like to call the palliative team back later and remains hopeful that the family will come to a universal decision.  Discussed with patient the importance of continued conversation with family and their  medical providers regarding overall plan of care and treatment options, ensuring decisions are within the context of the patients values and GOCs.  Questions and concerns addressed   We will continue to follow with Levi Pruitt closely given his high risk for clinical decline and deterioration a decision may need to come to fruition sooner than was originally anticipated.   Vital Signs Vitals:   07/25/19 0405 07/25/19 0738  BP: (!) 111/48 (!) 109/23  Pulse: 71 75  Resp: (!) 26 (!) 34  Temp: 98.3 F (36.8 C) 98.7 F (37.1 C)  SpO2: 95% 98%    Intake/Output Summary (Last 24 hours) at 07/25/2019 0817 Last data filed at 07/24/2019 2208 Gross per 24 hour  Intake --  Output 1275 ml  Net -1275 ml   Last Weight  Most  recent update: 07/25/2019  4:33 AM   Weight  93.6 kg (206 lb 5.6 oz)           Physical Exam Vitals and nursing note reviewed.  Constitutional:      Comments: Elderly ill appearing M  HENT:     Head: Normocephalic.     Nose: Nose normal.     Mouth/Throat:     Mouth: Mucous membranes are dry.  Eyes:     Pupils: Pupils are equal, round, and reactive to light.  Cardiovascular:     Rate and Rhythm: Normal rate and regular rhythm.  Pulmonary:     Effort: On 4LPM [place]. Pulmonary effort is abnormal.  Abdominal:     General: Abdomen is flat.     Palpations: Abdomen is soft.  Musculoskeletal:     Cervical back: Normal range of motion.     Comments: Generalized weakness, loss of tone  Skin:    General: Skin is dry.     Capillary Refill: Capillary refill takes less than 2 seconds.   SUMMARY OF RECOMMENDATIONS   Continue to have ongoing discussions with patients family regarding poor prognosis  If patient deteriorates further an immanent conversation would need to be held, right now patients partner is struggling with being placed in the middle of the patient and his  children. She remains to try to act within the best wishes of the patient which conflicts with his children's views  Patient remains do not intubate   Time Spent: 25 Greater than 50% of the time was spent in counseling and coordination of care ______________________________________________________________________________________ Home Team Team Cell Phone: [phone] Please utilize secure chat with additional questions, if there is no response within 30 minutes please call the above phone number  Palliative Medicine Team providers are available by phone from 7am to 7pm daily and can be reached through the team cell phone.  Should this patient require assistance outside of these hours, please call the patient's attending physician.

## 2019-07-25 NOTE — Progress Notes (Addendum)
  Chaplain responded to spiritual care consult.  Dau was bedside, reading.  Pt dozes in and out of sleep, appears weary. Pt tried on several occasions to communicate with dau, motioning with his hand, which is in a cuff.  Dau finally understood that pt wants the cuff off, but she replied, "I'm sorry, Dad. We can't right now." Chaplain offered prayer - pt is Methodist - but it seemed pt was indicating "later".    Chaplain attempted to reach partner, [name], by phone per consult request.  No ans. No VM.    Pt's GF [name] called back to unit. Chaplain provided spiritual and emotional support through active listening. She continues to be very frustrated about current push by pt's adult children for more aggressive care and their apparent resistance to [facility] advocacy to respect pt wishes per his living will. [name] appears to be trying hard to not escalate conflict, but feels very caught in the middle. For example, she is seeking to be accommodating by allowing out of state dau [place] to be the main visitor.  Question:  Are medical staff required to speak to her [name]) since she is HCPOA? (Dau Alice told [name] that she [name]) had spoken to a hospitalist who, [name] claimed, contradicted a more palliative approach. [name] fears the medical staff is going to only communicate with family, and not also her, the HCPOA of record.)    Can medical staff be guided by pt's  HCPOA and can they explain that to other family members?   GF [name] is very spiritual (had been Methodist, now independent) and welcomed prayer over the phone. She noted that pt is Methodist by tradition, but not as involved.    Recommend ongoing support from Palliative and Spiritual Care team members.  Please call as needed.  Luana Shu [phone]      07/25/19 1200  Clinical Encounter Type  Visited With Patient and family together  Visit Type Initial;Psychological support  Referral From  (spiritual consult)   Consult/Referral To Chaplain  Stress Factors  Patient Stress Factors Loss of control  Family Stress Factors Family relationships

## 2019-07-25 NOTE — Progress Notes (Signed)
Daughter requesting pt be placed on [place] and remove bipap mask. Pt has hand on mask upon RN's entry to room and has removed the hose from the bipap mask. Pt placed on 4L [place]. Will continue to monitor.

## 2019-07-25 NOTE — Progress Notes (Signed)
   Palliative Medicine Inpatient Follow Up Note  Addendum: Received a call from [name] who has made the decision for Levi Pruitt to be DNR/DNI. She vocalized that she feels she is acting in the patients best interest by choosing for him to be a no code. She asked that we continue all other interventions in the interim while she continues to navigate with the patients family the next steps from here.   SUMMARY OF RECOMMENDATIONS   Continue to have ongoing discussions with patients family regarding poor prognosis  If patient deteriorates further an immanent conversation would need to be held, right now patients partner is struggling with being placed in the middle of the patient and his children. She remains to try to act within the best wishes of the patient which conflicts with his children's views  Patient changed to a DNR/DNI  Time Spent: 15 Greater than 50% of the time was spent in counseling and coordination of care ______________________________________________________________________________________ [place] Team Team Cell Phone: [phone] Please utilize secure chat with additional questions, if there is no response within 30 minutes please call the above phone number  Palliative Medicine Team providers are available by phone from 7am to 7pm daily and can be reached through the team cell phone.  Should this patient require assistance outside of these hours, please call the patient's attending physician.

## 2019-07-25 NOTE — Progress Notes (Signed)
OT Cancellation Note  Patient Details Name: Levi Pruitt MRN: [id number] DOB: 1946-05-17   Cancelled Treatment:    Reason Eval/Treat Not Completed: Medical issues which prohibited therapy; upon entry to room pt with notable labored breathing (significant belly breathing), O2 sats stable, daughter present and reports already contacted RN to reapply bipap. RN present prior to exiting to apply. Will follow.   Lou Cal, OT [facility] Pager [phone] Office [phone]   Raymondo Band 07/25/2019, 3:55 PM

## 2019-07-25 NOTE — Progress Notes (Signed)
30 min after removing Bipap, daughter requests this RN put pt back on Bipap for increased WOB. Bipap reapplied and administered prn Morphine for air hunger per MAR.

## 2019-07-25 NOTE — Progress Notes (Signed)
PROGRESS NOTE    Levi Pruitt  [id number] DOB: 02/02/1946 DOA: 07/18/2019 PCP: Patient, No Pcp Per     Brief Narrative:  Levi Pruitt is a 74 y.o. male with medical history significant of recent CVA with right sided hemiparesis/dysphagia requiring PEG tube placement and complicated by inferior wall MI with subsequent discharge to CIR on 07/07/2019.  While in inpatient rehab, patient's condition deteriorated with worsening shortness of breath with atrial fibrillation with RVR and hypotension on 2/4.  Chest x-ray noted worsening infiltrates he was given intravenous Lasix and readmitted into the hospital.  His hospital course was complicated by waxing and waning mental status.  He was continued on BiPAP, given IV Lasix, and empiric antibiotics for suspected recurrent aspiration.  Cardiology was consulted due to the patient being in atrial fibrillation with hypotension, and he was started on IV amiodarone.  Cardiology adjusted medications of Lasix and amiodarone via PEG.  On 2/8 his mental status deteriorated and he was found to have a large subacute left PCA infarct and small subacute cortical infarcts in left frontal lobe.  Right upper extremity doppler done due to swelling revealed an acute thrombus in the cephalic vein and antecubital site likely due to DVT.  At that time it was recommended that he continue on dual antiplatelet therapy.  He was able to be weaned off BiPAP down to nasal cannula oxygen and recommended to wear BiPAP at night for hypercapnic respiratory failure.  Palliative care have been consulted due to patient's poor overall prognosis and family elected for no intubation.  He medically optimized and cleared to resume his rehab course and admitted back to inpatient rehab 2/10.  Overnight, the patient was on BiPAP, but this morning when it was removed, he had developed acute respiratory distress with increased work of breathing.  Patient was placed on BiPAP for work of breathing and  transferred back to the hospital.   New events last 24 hours / Subjective: Patient agitated this morning, kept pulling off BiPAP and BiPAP was removed around 730am per RN report. On my examination around 9am, patient using accessory muscles with increase in RR and work of breathing. Asked patient to be placed back on BiPAP with IV morphine 1mg  dose for air hunger.   Discussed with palliative care NP as well as HCPOA over the phone. Patient had voiced this morning that he did not want to be back on BiPAP. He is DNI but not DNR. Discussed with Hadassah Pais that if he no longer desires BiPAP, we really need to consider full DNR status and initiate comfort measures so he does not suffer. [name] will discuss with patient's daughter as well as palliative care later today.   Assessment & Plan:   Principal Problem:   Acute on chronic respiratory failure with hypoxia and hypercapnia (HCC) Active Problems:   CHF (congestive heart failure) (HCC)   Dysphagia, post-stroke   Transaminitis   History of CVA with residual deficit   History of myocardial infarction   Thrombosis of right cephalic vein   Goals of care, counseling/discussion   Do not intubate but use all other measures   Acute on chronic respiratory failure with hypercapnia and hypoxia, suspected aspiration pneumonia as well as acute on chronic combined systolic and diastolic heart failure -Patient readmitted into the hospital after developing acute respiratory distress.  Placed on BiPAP.  He had been using BiPAP nightly most recently.  -Chest x-ray showing persistent bilateral opacities with slight increase in the left upper lobe concerning  for multifocal pneumonia.  It appears previously symptoms were related to a combination of aspiration and CHF. -Last EF noted to be 30 to 35% with grade 3 diastolic dysfunction on [id number].  -BNP 990.5 -Procalcitonin 0.82 -Continue Rocephin and azithromycin  -Continue IV Lasix -Back on BiPAP this  morning. Further goals of care conversation as above   Paroxysmal atrial fibrillation -Continue on amiodarone 200 mg twice daily x1 week, then once twice daily x1 week, and then discontinue.  Anticoagulation had not been recommended by cardiology.  Acute kidney injury -Cr stable. Continue to monitor closely while on IV Lasix  Recent CVA with with residual weakness and dysphagia -CT scan of the brain on 07/19/2019 showed baseline atrophy, subacute large PCA/small subcortical infarct with petechial hemorrhage and/or cortical laminar necrosis noted within portions of the left parieto-occipital infarction territory. -Continue dual antiplatelet therapy and statin -PT/OT/speech therapy to eval and treat -Continue tube feeds per PEG  Recent acute inferior STEMI -Patient was not a candidate for any cardiac intervention at that time  Elevated liver enzymes -Patient noted during his previous hospitalization to have elevation in his liver enzymes that was thought possibly related to sepsis versus congestion.  Lipitor had been continued due to recent stroke -Continue to monitor, improved   Severe carotid stenosis, PCA, vertebral stenosis -Previously recommended for outpatient follow-up with vascular surgery.  Recommendations including avoid low blood pressures. -Continue aspirin, Plavix, and statin  8 mm parotid neoplasm -Outpatient follow-up with ENT once acute issues resolved  Right cephalic vein thrombosis -Doppler ultrasound revealed the cephalic vein thrombus on 2/11.  Thought to be superficial in nature related to the IV.  Not to require anticoagulation.  Diabetes mellitus type 2 -Hemoglobin A1c 5.7 on 07/15/2019 -CBGs every 4 hours with sensitive SSI    DVT prophylaxis: Lovenox Code Status: DNI Family Communication: None at bedside; discussed with HCPOA/significant other over the phone  Disposition Plan:  . Patient is from home and most recently CIR prior to  admission. . Currently in-hospital treatment needed due to respiratory failure requiring BiPAP.  [name] Suspect patient will discharge to SNF vs hospice pending palliative care discussion.    Consultants:   Palliative care medicine  Procedures:   None   Antimicrobials:  Anti-infectives (From admission, onward)   Start     Dose/Rate Route Frequency Ordered Stop   08/03/2019 1800  cefTRIAXone (ROCEPHIN) 2 g in sodium chloride 0.9 % 100 mL IVPB     2 g 200 mL/hr over 30 Minutes Intravenous Every 24 hours 07/21/2019 1745     08/05/2019 1800  azithromycin (ZITHROMAX) 500 mg in sodium chloride 0.9 % 250 mL IVPB     500 mg 250 mL/hr over 60 Minutes Intravenous Every 24 hours 07/27/2019 1749         Objective: Vitals:   07/25/19 0009 07/25/19 0106 07/25/19 0405 07/25/19 0738  BP: (!) 150/75  (!) 111/48 (!) 109/23  Pulse: 98 79 71 75  Resp: (!) 31 (!) 27 (!) 26 (!) 34  Temp: 98.2 F (36.8 C)  98.3 F (36.8 C) 98.7 F (37.1 C)  TempSrc: Axillary  Axillary Axillary  SpO2: 91% 95% 95% 98%  Weight:   93.6 kg     Intake/Output Summary (Last 24 hours) at 07/25/2019 1021 Last data filed at 07/24/2019 2208 Gross per 24 hour  Intake --  Output 1275 ml  Net -1275 ml   Filed Weights   07/24/19 0340 07/25/19 0405  Weight: 91.3 kg 93.6 kg  Examination: General exam: Appears agitated, with hand mittens in place, in visible distress  Respiratory system: +accessory muscle use, increase in RR, in respiratory distress off BiPAP  Cardiovascular system: S1 & S2 heard, RRR. No pedal edema. Gastrointestinal system: Abdomen is nondistended, soft and nontender. Normal bowel sounds heard. Central nervous system: Alert  Extremities: Symmetric in appearance bilaterally  Skin: No rashes, lesions or ulcers on exposed skin    Data Reviewed: I have personally reviewed following labs and imaging studies  CBC: Recent Labs  Lab 07/21/19 0353 08/07/2019 0614 07/23/19 0344 07/24/19 0405 07/25/19 0323   WBC 11.5* 14.1* 12.1* 12.1* 13.9*  NEUTROABS  --  10.8*  --   --   --   HGB 12.3* 12.1* 11.8* 11.6* 11.9*  HCT 39.5 39.1 38.2* 36.8* 37.5*  MCV 98.5 99.0 99.2 97.9 96.9  PLT 284 310 265 262 0000000   Basic Metabolic Panel: Recent Labs  Lab 07/20/19 0256 07/20/19 1828 07/21/19 0353 08/06/2019 0614 07/23/19 0344 07/24/19 0405 07/25/19 0323  NA 146*  --  147* 144 143 140 141  K 3.2*   < > 4.0 3.8 3.7 3.8 3.9  CL 99  --  104 96* 99 101 96*  CO2 37*  --  33* 33* 32 33* 32  GLUCOSE 107*  --  146* 136* 130* 144* 157*  BUN 35*  --  34* 32* 29* 27* 25*  CREATININE 1.20  --  1.22 1.28* 1.08 1.16 1.07  CALCIUM 8.5*  --  8.5* 8.8* 8.3* 8.0* 8.3*  MG 2.3  --   --   --   --   --   --    < > = values in this interval not displayed.   GFR: Estimated Creatinine Clearance: 69.5 mL/min (by C-G formula based on SCr of 1.07 mg/dL). Liver Function Tests: Recent Labs  Lab 07/19/19 0337 07/20/19 0256 07/21/19 0353 07/31/2019 0614 07/24/19 0405  AST 95* 177* 200* 153* 120*  ALT 155* 220* 283* 267* 215*  ALKPHOS 125 109 113 115 109  BILITOT 0.5 0.4 0.5 0.7 0.3  PROT 6.1* 5.9* 5.6* 5.9* 5.5*  ALBUMIN 2.4* 2.3* 2.3* 2.4* 2.1*   No results for input(s): LIPASE, AMYLASE in the last 168 hours. No results for input(s): AMMONIA in the last 168 hours. Coagulation Profile: No results for input(s): INR, PROTIME in the last 168 hours. Cardiac Enzymes: No results for input(s): CKTOTAL, CKMB, CKMBINDEX, TROPONINI in the last 168 hours. BNP (last 3 results) No results for input(s): PROBNP in the last 8760 hours. HbA1C: No results for input(s): HGBA1C in the last 72 hours. CBG: Recent Labs  Lab 07/24/19 1557 07/24/19 1953 07/25/19 0007 07/25/19 0345 07/25/19 0737  GLUCAP 138* 92 124* 147* 130*   Lipid Profile: No results for input(s): CHOL, HDL, LDLCALC, TRIG, CHOLHDL, LDLDIRECT in the last 72 hours. Thyroid Function Tests: No results for input(s): TSH, T4TOTAL, FREET4, T3FREE, THYROIDAB in the  last 72 hours. Anemia Panel: No results for input(s): VITAMINB12, FOLATE, FERRITIN, TIBC, IRON, RETICCTPCT in the last 72 hours. Sepsis Labs: Recent Labs  Lab 07/27/2019 1158  PROCALCITON 0.82    Recent Results (from the past 240 hour(s))  MRSA PCR Screening     Status: None   Collection Time: 07/16/19 12:05 PM   Specimen: Nasal Mucosa; Nasopharyngeal  Result Value Ref Range Status   MRSA by PCR NEGATIVE NEGATIVE Final    Comment:        The GeneXpert MRSA Assay (FDA approved for NASAL  specimens only), is one component of a comprehensive MRSA colonization surveillance program. It is not intended to diagnose MRSA infection nor to guide or monitor treatment for MRSA infections. Performed at [facility], [place] [address] [place], [place] [address]       Radiology Studies: No results found.    Scheduled Meds: . amiodarone  200 mg Per Tube BID  . aspirin  324 mg Per Tube Daily  . atorvastatin  80 mg Per Tube q1800  . clopidogrel  75 mg Per Tube Daily  . enoxaparin  40 mg Subcutaneous Q24H  . free water  250 mL Per Tube Q6H  . furosemide  20 mg Intravenous BID  . insulin aspart  0-9 Units Subcutaneous Q4H  . methylphenidate  5 mg Oral BID WC  . sodium chloride flush  3 mL Intravenous Q12H   Continuous Infusions: . azithromycin 500 mg (07/24/19 1753)  . cefTRIAXone (ROCEPHIN)  IV 2 g (07/24/19 1719)  . feeding supplement (JEVITY 1.5 CAL/FIBER) 1,000 mL (07/23/19 0936)     LOS: 3 days      Time spent: 35 minutes   Dessa Phi, DO [facility] 07/25/2019, 10:21 AM   Available via Epic secure chat 7am-7pm After these hours, please refer to coverage provider listed on [email or web address]

## 2019-07-25 NOTE — Progress Notes (Signed)
PT Cancellation Note  Patient Details Name: Say Fripp MRN: [id number] DOB: Sep 18, 1945   Cancelled Treatment:    Reason Eval/Treat Not Completed: Medical issues which prohibited therapy. Pt with agonal breathing at rest upon arrival, RN notified and patient placed back on BiPAP.   Zenaida Niece 07/25/2019, 4:55 PM

## 2019-07-26 LAB — GLUCOSE, CAPILLARY
Glucose-Capillary: 107 mg/dL — ABNORMAL HIGH (ref 70–99)
Glucose-Capillary: 116 mg/dL — ABNORMAL HIGH (ref 70–99)
Glucose-Capillary: 118 mg/dL — ABNORMAL HIGH (ref 70–99)
Glucose-Capillary: 124 mg/dL — ABNORMAL HIGH (ref 70–99)
Glucose-Capillary: 129 mg/dL — ABNORMAL HIGH (ref 70–99)
Glucose-Capillary: 131 mg/dL — ABNORMAL HIGH (ref 70–99)
Glucose-Capillary: 132 mg/dL — ABNORMAL HIGH (ref 70–99)
Glucose-Capillary: 134 mg/dL — ABNORMAL HIGH (ref 70–99)

## 2019-07-26 LAB — HEPATIC FUNCTION PANEL
ALT: 200 U/L — ABNORMAL HIGH (ref 0–44)
AST: 89 U/L — ABNORMAL HIGH (ref 15–41)
Albumin: 2.4 g/dL — ABNORMAL LOW (ref 3.5–5.0)
Alkaline Phosphatase: 121 U/L (ref 38–126)
Bilirubin, Direct: <0.1 mg/dL (ref 0.0–0.2)
Total Bilirubin: 0.3 mg/dL (ref 0.3–1.2)
Total Protein: 6 g/dL — ABNORMAL LOW (ref 6.5–8.1)

## 2019-07-26 LAB — BASIC METABOLIC PANEL
Anion gap: 13 (ref 5–15)
BUN: 24 mg/dL — ABNORMAL HIGH (ref 8–23)
CO2: 30 mmol/L (ref 22–32)
Calcium: 8.2 mg/dL — ABNORMAL LOW (ref 8.9–10.3)
Chloride: 98 mmol/L (ref 98–111)
Creatinine, Ser: 1.02 mg/dL (ref 0.61–1.24)
GFR calc Af Amer: >60 mL/min (ref >60)
GFR calc non Af Amer: >60 mL/min (ref >60)
Glucose, Bld: 136 mg/dL — ABNORMAL HIGH (ref 70–99)
Potassium: 4.3 mmol/L (ref 3.5–5.1)
Sodium: 141 mmol/L (ref 135–145)

## 2019-07-26 LAB — CBC
HCT: 37.6 % — ABNORMAL LOW (ref 39.0–52.0)
Hemoglobin: 11.9 g/dL — ABNORMAL LOW (ref 13.0–17.0)
MCH: 30.7 pg (ref 26.0–34.0)
MCHC: 31.6 g/dL (ref 30.0–36.0)
MCV: 97.2 fL (ref 80.0–100.0)
Platelets: 293 10*3/uL (ref 150–400)
RBC: 3.87 MIL/uL — ABNORMAL LOW (ref 4.22–5.81)
RDW: 14.6 % (ref 11.5–15.5)
WBC: 16.1 10*3/uL — ABNORMAL HIGH (ref 4.0–10.5)
nRBC: 0 % (ref 0.0–0.2)

## 2019-07-26 MED ORDER — AMIODARONE HCL 200 MG PO TABS
200.0000 mg | ORAL_TABLET | Freq: Every day | ORAL | Status: DC
  Administered 2019-07-27: 200 mg
  Filled 2019-07-26: qty 1

## 2019-07-26 MED ORDER — AMIODARONE HCL 200 MG PO TABS
200.0000 mg | ORAL_TABLET | Freq: Two times a day (BID) | ORAL | Status: AC
  Administered 2019-07-26: 200 mg
  Filled 2019-07-26: qty 1

## 2019-07-26 MED ORDER — LORAZEPAM 2 MG/ML IJ SOLN
0.5000 mg | Freq: Once | INTRAMUSCULAR | Status: AC
  Administered 2019-07-26: 0.5 mg via INTRAVENOUS
  Filled 2019-07-26: qty 1

## 2019-07-26 NOTE — Progress Notes (Signed)
PROGRESS NOTE    Levi Pruitt  [id number] DOB: March 02, 1946 DOA: 07/17/2019 PCP: Patient, No Pcp Per     Brief Narrative:  Levi Pruitt is a 74 y.o. male with medical history significant of recent CVA with right sided hemiparesis/dysphagia requiring PEG tube placement and complicated by inferior wall MI with subsequent discharge to CIR on 07/07/2019.  While in inpatient rehab, patient's condition deteriorated with worsening shortness of breath with atrial fibrillation with RVR and hypotension on 2/4.  Chest x-ray noted worsening infiltrates he was given intravenous Lasix and readmitted into the hospital.  His hospital course was complicated by waxing and waning mental status.  He was continued on BiPAP, given IV Lasix, and empiric antibiotics for suspected recurrent aspiration.  Cardiology was consulted due to the patient being in atrial fibrillation with hypotension, and he was started on IV amiodarone.  Cardiology adjusted medications of Lasix and amiodarone via PEG.  On 2/8 his mental status deteriorated and he was found to have a large subacute left PCA infarct and small subacute cortical infarcts in left frontal lobe.  Right upper extremity doppler done due to swelling revealed an acute thrombus in the cephalic vein and antecubital site likely due to DVT.  At that time it was recommended that he continue on dual antiplatelet therapy.  He was able to be weaned off BiPAP down to nasal cannula oxygen and recommended to wear BiPAP at night for hypercapnic respiratory failure.  Palliative care have been consulted due to patient's poor overall prognosis and family elected for no intubation.  He medically optimized and cleared to resume his rehab course and admitted back to inpatient rehab 2/10.  Overnight, the patient was on BiPAP, but this morning when it was removed, he had developed acute respiratory distress with increased work of breathing.  Patient was placed on BiPAP for work of breathing and  transferred back to the hospital.   New events last 24 hours / Subjective: Daughter at bedside, patient had a sitter overnight. Required ativan due to agitation but now calm and resting. Remains on BiPAP and appears to be comfortable and tolerating well. I discussed with daughter that he will need to be consistently off BiPAP during the day to be considered to be improving. It's a day-to-day evaluation at this point. If he is not able to remain off BiPAP during the day, we will need to have further goals of care discussion as this is certainly not a long-term solution.   Assessment & Plan:   Principal Problem:   Acute on chronic respiratory failure with hypoxia and hypercapnia (HCC) Active Problems:   CHF (congestive heart failure) (HCC)   Dysphagia, post-stroke   Transaminitis   History of CVA with residual deficit   History of myocardial infarction   Thrombosis of right cephalic vein   Goals of care, counseling/discussion   Do not intubate but use all other measures   Acute on chronic respiratory failure with hypercapnia and hypoxia, suspected aspiration pneumonia as well as acute on chronic combined systolic and diastolic heart failure -Patient readmitted into the hospital after developing acute respiratory distress.  Placed on BiPAP.  He had been using BiPAP nightly most recently.  -Chest x-ray showing persistent bilateral opacities with slight increase in the left upper lobe concerning for multifocal pneumonia.  It appears previously symptoms were related to a combination of aspiration and CHF. -Last EF noted to be 30 to 35% with grade 3 diastolic dysfunction on [id number].  -BNP 990.5 -Procalcitonin 0.82 -Continue  Rocephin and azithromycin. WBC trending upward; may need to switch antibiotics, although he remains afebrile at this point. Continue to trend CBC  -Continue IV Lasix -On BiPAP this morning, hopefully can tolerate being off BiPAP later today   Paroxysmal atrial  fibrillation -Continue on amiodarone 200 mg twice daily x1 week (started 2/9), then 200mg  once daily x1 week (start 2/16), and then discontinue.  Anticoagulation had not been recommended by cardiology.  Acute kidney injury -Cr stable. Continue to monitor closely while on IV Lasix  Recent CVA with with residual weakness and dysphagia -CT scan of the brain on 07/19/2019 showed baseline atrophy, subacute large PCA/small subcortical infarct with petechial hemorrhage and/or cortical laminar necrosis noted within portions of the left parieto-occipital infarction territory. -Continue dual antiplatelet therapy and statin -PT/OT/speech therapy to eval and treat -Continue tube feeds per PEG  Recent acute inferior STEMI -Patient was not a candidate for any cardiac intervention at that time  Elevated liver enzymes -Patient noted during his previous hospitalization to have elevation in his liver enzymes that was thought possibly related to sepsis versus congestion.  Lipitor had been continued due to recent stroke -Continue to monitor, improved   Severe carotid stenosis, PCA, vertebral stenosis -Previously recommended for outpatient follow-up with vascular surgery.  Recommendations including avoid low blood pressures. -Continue aspirin, Plavix, and statin  8 mm parotid neoplasm -Outpatient follow-up with ENT once acute issues resolved  Right cephalic vein thrombosis -Doppler ultrasound revealed the cephalic vein thrombus on 2/11.  Thought to be superficial in nature related to the IV.  Not to require anticoagulation.  Diabetes mellitus type 2 -Hemoglobin A1c 5.7 on 07/15/2019 -CBGs every 4 hours with sensitive SSI    DVT prophylaxis: Lovenox Code Status: DNR  Family Communication: Daughter at bedside Disposition Plan:  . Patient is from home and most recently CIR prior to admission. . Currently in-hospital treatment needed due to respiratory failure requiring BiPAP.  [name] Suspect patient  will discharge to SNF vs hospice pending palliative care discussion.    Consultants:   Palliative care medicine  Procedures:   None   Antimicrobials:  Anti-infectives (From admission, onward)   Start     Dose/Rate Route Frequency Ordered Stop   08/01/2019 1800  cefTRIAXone (ROCEPHIN) 2 g in sodium chloride 0.9 % 100 mL IVPB     2 g 200 mL/hr over 30 Minutes Intravenous Every 24 hours 08/01/2019 1745     07/24/2019 1800  azithromycin (ZITHROMAX) 500 mg in sodium chloride 0.9 % 250 mL IVPB     500 mg 250 mL/hr over 60 Minutes Intravenous Every 24 hours 07/31/2019 1749         Objective: Vitals:   07/26/19 0358 07/26/19 0537 07/26/19 0817 07/26/19 0851  BP: 100/64  (!) 119/34   Pulse: 80  75 72  Resp:   (!) 40 (!) 33  Temp: 98.8 F (37.1 C)  99.5 F (37.5 C)   TempSrc: Axillary  Oral   SpO2: 96%  95% 95%  Weight:  94.1 kg      Intake/Output Summary (Last 24 hours) at 07/26/2019 1022 Last data filed at 07/25/2019 1824 Gross per 24 hour  Intake --  Output 600 ml  Net -600 ml   Filed Weights   07/24/19 0340 07/25/19 0405 07/26/19 0537  Weight: 91.3 kg 93.6 kg 94.1 kg    Examination: General exam: Appears calm and comfortable  Respiratory system: Clear to auscultation anteriorly. Respiratory effort normal. On BiPAP this morning  Cardiovascular system:  S1 & S2 heard, RRR. No pedal edema. Gastrointestinal system: Abdomen is nondistended, soft and nontender. Normal bowel sounds heard. Extremities: Symmetric in appearance bilaterally  Skin: No rashes, lesions or ulcers on exposed skin    Data Reviewed: I have personally reviewed following labs and imaging studies  CBC: Recent Labs  Lab 08/08/2019 0614 07/23/19 0344 07/24/19 0405 07/25/19 0323 07/26/19 0340  WBC 14.1* 12.1* 12.1* 13.9* 16.1*  NEUTROABS 10.8*  --   --   --   --   HGB 12.1* 11.8* 11.6* 11.9* 11.9*  HCT 39.1 38.2* 36.8* 37.5* 37.6*  MCV 99.0 99.2 97.9 96.9 97.2  PLT 310 265 262 278 0000000   Basic  Metabolic Panel: Recent Labs  Lab 07/20/19 0256 07/20/19 1828 07/19/2019 0614 07/23/19 0344 07/24/19 0405 07/25/19 0323 07/26/19 0340  NA 146*   < > 144 143 140 141 141  K 3.2*   < > 3.8 3.7 3.8 3.9 4.3  CL 99   < > 96* 99 101 96* 98  CO2 37*   < > 33* 32 33* 32 30  GLUCOSE 107*   < > 136* 130* 144* 157* 136*  BUN 35*   < > 32* 29* 27* 25* 24*  CREATININE 1.20   < > 1.28* 1.08 1.16 1.07 1.02  CALCIUM 8.5*   < > 8.8* 8.3* 8.0* 8.3* 8.2*  MG 2.3  --   --   --   --   --   --    < > = values in this interval not displayed.   GFR: Estimated Creatinine Clearance: 73.1 mL/min (by C-G formula based on SCr of 1.02 mg/dL). Liver Function Tests: Recent Labs  Lab 07/20/19 0256 07/21/19 0353 07/17/2019 0614 07/24/19 0405 07/26/19 0340  AST 177* 200* 153* 120* 89*  ALT 220* 283* 267* 215* 200*  ALKPHOS 109 113 115 109 121  BILITOT 0.4 0.5 0.7 0.3 0.3  PROT 5.9* 5.6* 5.9* 5.5* 6.0*  ALBUMIN 2.3* 2.3* 2.4* 2.1* 2.4*   No results for input(s): LIPASE, AMYLASE in the last 168 hours. No results for input(s): AMMONIA in the last 168 hours. Coagulation Profile: No results for input(s): INR, PROTIME in the last 168 hours. Cardiac Enzymes: No results for input(s): CKTOTAL, CKMB, CKMBINDEX, TROPONINI in the last 168 hours. BNP (last 3 results) No results for input(s): PROBNP in the last 8760 hours. HbA1C: No results for input(s): HGBA1C in the last 72 hours. CBG: Recent Labs  Lab 07/25/19 1947 07/26/19 0006 07/26/19 0354 07/26/19 0735 07/26/19 0909  GLUCAP 120* 134* 129* 131* 118*   Lipid Profile: No results for input(s): CHOL, HDL, LDLCALC, TRIG, CHOLHDL, LDLDIRECT in the last 72 hours. Thyroid Function Tests: No results for input(s): TSH, T4TOTAL, FREET4, T3FREE, THYROIDAB in the last 72 hours. Anemia Panel: No results for input(s): VITAMINB12, FOLATE, FERRITIN, TIBC, IRON, RETICCTPCT in the last 72 hours. Sepsis Labs: Recent Labs  Lab 07/25/2019 1158  PROCALCITON 0.82     Recent Results (from the past 240 hour(s))  MRSA PCR Screening     Status: None   Collection Time: 07/16/19 12:05 PM   Specimen: Nasal Mucosa; Nasopharyngeal  Result Value Ref Range Status   MRSA by PCR NEGATIVE NEGATIVE Final    Comment:        The GeneXpert MRSA Assay (FDA approved for NASAL specimens only), is one component of a comprehensive MRSA colonization surveillance program. It is not intended to diagnose MRSA infection nor to guide or monitor treatment for MRSA  infections. Performed at [facility], [place] [address] [place], [place] [address]       Radiology Studies: No results found.    Scheduled Meds: . amiodarone  200 mg Per Tube BID  . aspirin  324 mg Per Tube Daily  . atorvastatin  80 mg Per Tube q1800  . clopidogrel  75 mg Per Tube Daily  . enoxaparin  40 mg Subcutaneous Q24H  . free water  250 mL Per Tube Q6H  . furosemide  20 mg Intravenous BID  . insulin aspart  0-9 Units Subcutaneous Q4H  . methylphenidate  5 mg Oral BID WC  . sodium chloride flush  3 mL Intravenous Q12H   Continuous Infusions: . azithromycin 500 mg (07/25/19 1713)  . cefTRIAXone (ROCEPHIN)  IV 2 g (07/25/19 1824)  . feeding supplement (JEVITY 1.5 CAL/FIBER) 1,000 mL (07/23/19 0936)     LOS: 4 days      Time spent: 35 minutes   Dessa Phi, DO [facility] 07/26/2019, 10:22 AM   Available via Epic secure chat 7am-7pm After these hours, please refer to coverage provider listed on [email or web address]

## 2019-07-26 NOTE — Progress Notes (Signed)
OT Cancellation Note  Patient Details Name: Levi Pruitt MRN: [id number] DOB: 1945-09-05   Cancelled Treatment:    Reason Eval/Treat Not Completed: Medical issues which prohibited therapy(pt remains on bipap, will sign off and await new order)  Levi Pruitt 07/26/2019, 9:25 AM  Nestor Lewandowsky, OTR/L Acute Rehabilitation Services Pager: [phone] Office: [phone]

## 2019-07-26 NOTE — Progress Notes (Signed)
Pharmacist Heart Failure Core Measure Documentation  Assessment: Levi Pruitt has an EF documented as 30-35 on 06/25/19 by echo.  Rationale: Heart failure patients with left ventricular systolic dysfunction (LVSD) and an EF < 40% should be prescribed an angiotensin converting enzyme inhibitor (ACEI) or angiotensin receptor blocker (ARB) at discharge unless a contraindication is documented in the medical record.  This patient is not currently on an ACEI or ARB for HF.  This note is being placed in the record in order to provide documentation that a contraindication to the use of these agents is present for this encounter.  ACE Inhibitor or Angiotensin Receptor Blocker is contraindicated (specify all that apply)  []   ACEI allergy AND ARB allergy []   Angioedema []   Moderate or severe aortic stenosis []   Hyperkalemia [x]   Hypotension []   Renal artery stenosis []   Worsening renal function, preexisting renal disease or dysfunction  Barth Kirks, PharmD, BCPS, BCCCP Clinical Pharmacist [phone]  Please check AMION for all [facility] numbers  07/26/2019 12:10 PM

## 2019-07-26 NOTE — Progress Notes (Signed)
1800 Removed Bipap to see if patient would tolerate HFNC. Patient tolerated 8 minutes off the Bipap before displaying WOB. Bipap reapplied and PRN Morphine 1 mg IV given.   [employer] Maylene Roes, J made aware that patient was unable to tolerate being off the [place].   74 Reassessed patient and he seems to have relaxed and is tolerating the Bipap well.

## 2019-07-26 NOTE — Progress Notes (Signed)
PT Cancellation Note  Patient Details Name: Levi Pruitt MRN: [id number] DOB: February 19, 1946   Cancelled Treatment:    Reason Eval/Treat Not Completed: Medical issues which prohibited therapy. Pt remains on bipap. PT to sign off at this time. Will await reorder if med status improves.   Lorriane Shire 07/26/2019, 11:20 AM  Lorrin Goodell, PT  Office # [phone] Pager [phone]

## 2019-07-26 NOTE — Progress Notes (Signed)
SLP Cancellation Note  Patient Details Name: Levi Pruitt MRN: [id number] DOB: 10-08-1945   Cancelled treatment:       Reason Eval/Treat Not Completed: Patient's level of consciousness;Patient not medically ready(Per Galdys, RN, pt is "not really responding" and still requires BiPAP. SLP will follow up.)  Tsutomu Barfoot I. Hardin Negus, [place], [place] Office number [phone] Pager [place] 07/26/2019, 9:42 AM

## 2019-07-26 NOTE — Progress Notes (Addendum)
Patient ID: Levi Pruitt, male   DOB: 1946-02-21, 74 y.o.   MRN: [id number]  This NP visited patient at the bedside as a follow up for  Palliative  Medicine  needs and emotional support.    Patient is currently on BiPAP and appears generally uncomfortable, lethargic and using abdominal muscle for respiratory support. Unsuccessful trial  attempts off BiPap   Discussed with nursing,  utilization of as needed meds for pain and air hunger.  Initial admission for stroke was June 18, 2019  Patient's daughter [name] is at the bedside, educated her on low-dose medications for symptom management to enhance comfort and dignity.   SO/[name] Dorsett/[name], called me for for continued conversation regarding current medical situation.  Again we discussed the patient's poor prognosis.    We discussed the difference between an aggressive medical intervention path and a palliative comfort path.  We discussed the concept of quality over quantity of life.  I again raised awareness to the patient's documented declaration of a desire for a natural death.    [name] understands the seriousness of the current medical situation and the poor prognosis,  however she also is trying to balance the feelings and wishes of Mr. Falke biological family.  [name] is the documented healthcare power of attorney but she is not legally married to Mr. Blahnik.  She is diligent in including [name]'s family in  decisions, but ultimately wants to honor his wishes  Plan of Care: - DNR/DNI -continue to utilize BiPap - open to all offered and available medical interventions to prolong life.  -symptom management        -Morphine 1 mg IV every 3 hrs prn       - Ativan 0.5 mg IV every  -oning decisions regarding GOCs  depending on outcomes  Discussed with [name] the importance of continued conversation with family and the  medical providers regarding overall plan of care and treatment options,  ensuring decisions are within the context of  the patients values and GOCs.   Education and emotional support offered     [name] plans to be at the bedside tomorrow morning for an in person conversation with myself.    Late entry: 1800 Call from Amy/RN regarding Mr Cicio's ongoing decline and visible distress unrelieved by prn medications.  He is dyspneic on BiPap, utilizing abdominal muscles. I spoke to [name]/Shelia by telephone recommending increased frequency of Morphine and Ativan for symptom relief, she understands and agrees.     - Morphine 1 mg IV every 1 hr prn     - Ativan 1 mg IV every 4 hrs prn Discussed with nursing that both daughter/Alice and [name]/Shelia both will likely stay at the bedside thru the night.  Prognosis is limited, likely hours to days  Again discussed with [name] the recommendation to liberate Mr Curro from the Bipap , allowing a natural death, his documented wish.  She will discuss with family and get back to me.  I encouraged her to call me before 9 pm tonight  Total time spent on the unit was 35  minutes  Greater than 50% of the time was spent in counseling and coordination of care  [name] NP  Palliative Medicine Team Team Phone # [phone] Pager [phone]

## 2019-07-27 ENCOUNTER — Inpatient Hospital Stay (HOSPITAL_COMMUNITY): Payer: Medicare Other

## 2019-07-27 DIAGNOSIS — J9622 Acute and chronic respiratory failure with hypercapnia: Secondary | ICD-10-CM

## 2019-07-27 DIAGNOSIS — Z515 Encounter for palliative care: Secondary | ICD-10-CM

## 2019-07-27 DIAGNOSIS — R0600 Dyspnea, unspecified: Secondary | ICD-10-CM

## 2019-07-27 DIAGNOSIS — R0609 Other forms of dyspnea: Secondary | ICD-10-CM

## 2019-07-27 DIAGNOSIS — J9621 Acute and chronic respiratory failure with hypoxia: Secondary | ICD-10-CM

## 2019-07-27 LAB — CBC
HCT: 34 % — ABNORMAL LOW (ref 39.0–52.0)
Hemoglobin: 10.6 g/dL — ABNORMAL LOW (ref 13.0–17.0)
MCH: 30.8 pg (ref 26.0–34.0)
MCHC: 31.2 g/dL (ref 30.0–36.0)
MCV: 98.8 fL (ref 80.0–100.0)
Platelets: 262 10*3/uL (ref 150–400)
RBC: 3.44 MIL/uL — ABNORMAL LOW (ref 4.22–5.81)
RDW: 14.7 % (ref 11.5–15.5)
WBC: 13.3 10*3/uL — ABNORMAL HIGH (ref 4.0–10.5)
nRBC: 0 % (ref 0.0–0.2)

## 2019-07-27 LAB — BASIC METABOLIC PANEL
Anion gap: 10 (ref 5–15)
BUN: 26 mg/dL — ABNORMAL HIGH (ref 8–23)
CO2: 32 mmol/L (ref 22–32)
Calcium: 7.9 mg/dL — ABNORMAL LOW (ref 8.9–10.3)
Chloride: 98 mmol/L (ref 98–111)
Creatinine, Ser: 1.04 mg/dL (ref 0.61–1.24)
GFR calc Af Amer: >60 mL/min (ref >60)
GFR calc non Af Amer: >60 mL/min (ref >60)
Glucose, Bld: 133 mg/dL — ABNORMAL HIGH (ref 70–99)
Potassium: 4 mmol/L (ref 3.5–5.1)
Sodium: 140 mmol/L (ref 135–145)

## 2019-07-27 LAB — GLUCOSE, CAPILLARY
Glucose-Capillary: 110 mg/dL — ABNORMAL HIGH (ref 70–99)
Glucose-Capillary: 152 mg/dL — ABNORMAL HIGH (ref 70–99)
Glucose-Capillary: 116 mg/dL — ABNORMAL HIGH (ref 70–99)
Glucose-Capillary: 133 mg/dL — ABNORMAL HIGH (ref 70–99)
Glucose-Capillary: 112 mg/dL — ABNORMAL HIGH (ref 70–99)
Glucose-Capillary: 107 mg/dL — ABNORMAL HIGH (ref 70–99)

## 2019-07-27 MED ORDER — LIP MEDEX EX OINT
TOPICAL_OINTMENT | CUTANEOUS | Status: DC | PRN
  Filled 2019-07-27: qty 7

## 2019-07-27 MED ORDER — LORAZEPAM 2 MG/ML IJ SOLN
1.0000 mg | INTRAMUSCULAR | Status: DC | PRN
  Administered 2019-07-27 – 2019-07-28 (×2): 1 mg via INTRAVENOUS
  Filled 2019-07-27 (×2): qty 1

## 2019-07-27 MED ORDER — MORPHINE SULFATE (PF) 2 MG/ML IV SOLN
1.0000 mg | INTRAVENOUS | Status: DC | PRN
  Administered 2019-07-27 – 2019-07-29 (×13): 1 mg via INTRAVENOUS
  Filled 2019-07-27 (×13): qty 1

## 2019-07-27 NOTE — Progress Notes (Signed)
PROGRESS NOTE    Levi Pruitt  [id number] DOB: 1946-03-23 DOA: 07/13/2019 PCP: Patient, No Pcp Per     Brief Narrative:  Levi Pruitt is a 74 y.o. male with medical history significant of recent CVA with right sided hemiparesis/dysphagia requiring PEG tube placement and complicated by inferior wall MI with subsequent discharge to CIR on 07/07/2019.  While in inpatient rehab, patient's condition deteriorated with worsening shortness of breath with atrial fibrillation with RVR and hypotension on 2/4.  Chest x-ray noted worsening infiltrates he was given intravenous Lasix and readmitted into the hospital.  His hospital course was complicated by waxing and waning mental status.  He was continued on BiPAP, given IV Lasix, and empiric antibiotics for suspected recurrent aspiration.  Cardiology was consulted due to the patient being in atrial fibrillation with hypotension, and he was started on IV amiodarone.  Cardiology adjusted medications of Lasix and amiodarone via PEG.  On 2/8 his mental status deteriorated and he was found to have a large subacute left PCA infarct and small subacute cortical infarcts in left frontal lobe.  Right upper extremity doppler done due to swelling revealed an acute thrombus in the cephalic vein and antecubital site likely due to DVT.  At that time it was recommended that he continue on dual antiplatelet therapy.  He was able to be weaned off BiPAP down to nasal cannula oxygen and recommended to wear BiPAP at night for hypercapnic respiratory failure.  Palliative care have been consulted due to patient's poor overall prognosis and family elected for no intubation.  He medically optimized and cleared to resume his rehab course and admitted back to inpatient rehab 2/10.  Overnight, the patient was on BiPAP, but this morning when it was removed, he had developed acute respiratory distress with increased work of breathing.  Patient was placed on BiPAP for work of breathing and  transferred back to the hospital.   New events last 24 hours / Subjective: Daughter at bedside, states that patient has been more alert this morning.  Patient did not tolerate being off BiPAP yesterday afternoon.  Currently, he states that he is comfortable on BiPAP although does exhibit some increased work in breathing this morning.  Assessment & Plan:   Principal Problem:   Acute on chronic respiratory failure with hypoxia and hypercapnia (HCC) Active Problems:   CHF (congestive heart failure) (HCC)   Dysphagia, post-stroke   Transaminitis   History of CVA with residual deficit   History of myocardial infarction   Thrombosis of right cephalic vein   Goals of care, counseling/discussion   Do not intubate but use all other measures   Acute on chronic respiratory failure with hypercapnia and hypoxia, suspected aspiration pneumonia as well as acute on chronic combined systolic and diastolic heart failure -Patient readmitted into the hospital after developing acute respiratory distress.  Placed on BiPAP.  He had been using BiPAP nightly most recently.  -Chest x-ray showing persistent bilateral opacities with slight increase in the left upper lobe concerning for multifocal pneumonia.  It appears previously symptoms were related to a combination of aspiration and CHF. -Last EF noted to be 30 to 35% with grade 3 diastolic dysfunction on [id number].  -BNP 990.5 -Procalcitonin 0.82 -Continue Rocephin and azithromycin -Continue IV Lasix -Repeat chest x-ray showed extensive bilateral airspace disease, most prominent in the right lung apex -Has not tolerated being off BiPAP for a prolonged period of time in the past couple of days.  Discussed with daughter at bedside that if  patient cannot reliably be off of BiPAP and remained stable, we will need to consider comfort measures  Paroxysmal atrial fibrillation -Continue on amiodarone 200 mg twice daily x1 week (started 2/9), then 200mg  once daily  x1 week (start 2/16), and then discontinue.  Anticoagulation had not been recommended by cardiology.  Acute kidney injury -Cr stable. Continue to monitor closely while on IV Lasix  Recent CVA with with residual weakness and dysphagia -CT scan of the brain on 07/19/2019 showed baseline atrophy, subacute large PCA/small subcortical infarct with petechial hemorrhage and/or cortical laminar necrosis noted within portions of the left parieto-occipital infarction territory. -Continue dual antiplatelet therapy and statin -PT/OT/speech therapy to eval and treat -Continue tube feeds per PEG  Recent acute inferior STEMI -Patient was not a candidate for any cardiac intervention at that time  Elevated liver enzymes -Patient noted during his previous hospitalization to have elevation in his liver enzymes that was thought possibly related to sepsis versus congestion.  Lipitor had been continued due to recent stroke -Continue to monitor  Severe carotid stenosis, PCA, vertebral stenosis -Previously recommended for outpatient follow-up with vascular surgery.  Recommendations including avoid low blood pressures. -Continue aspirin, Plavix, and statin  8 mm parotid neoplasm -Outpatient follow-up with ENT once acute issues resolved  Right cephalic vein thrombosis -Doppler ultrasound revealed the cephalic vein thrombus on 2/11.  Thought to be superficial in nature related to the IV.  Not to require anticoagulation.  Diabetes mellitus type 2 -Hemoglobin A1c 5.7 on 07/15/2019 -CBGs every 4 hours with sensitive SSI    DVT prophylaxis: Lovenox Code Status: DNR  Family Communication: Daughter at bedside Disposition Plan:   Patient is from home and most recently CIR prior to admission.  Currently in-hospital treatment needed due to respiratory failure requiring BiPAP.   Suspect patient will discharge to SNF vs hospice pending palliative care discussion.    Consultants:   Palliative care  medicine  Procedures:   None   Antimicrobials:  Anti-infectives (From admission, onward)   Start     Dose/Rate Route Frequency Ordered Stop   07/21/2019 1800  cefTRIAXone (ROCEPHIN) 2 g in sodium chloride 0.9 % 100 mL IVPB     2 g 200 mL/hr over 30 Minutes Intravenous Every 24 hours 07/27/2019 1745     08/01/2019 1800  azithromycin (ZITHROMAX) 500 mg in sodium chloride 0.9 % 250 mL IVPB     500 mg 250 mL/hr over 60 Minutes Intravenous Every 24 hours 08/03/2019 1749         Objective: Vitals:   07/27/19 0315 07/27/19 0322 07/27/19 0752 07/27/19 0800  BP: 104/61 104/61 (!) 106/57 (!) 116/97  Pulse: 72 76 73 71  Resp: (!) 22 (!) 25 (!) 28 (!) 30  Temp: 98.5 F (36.9 C)  97.9 F (36.6 C)   TempSrc: Axillary  Oral   SpO2:  96% 100% 98%  Weight: 94.2 kg       Intake/Output Summary (Last 24 hours) at 07/27/2019 0955 Last data filed at 07/27/2019 0215 Gross per 24 hour  Intake --  Output 450 ml  Net -450 ml   Filed Weights   07/25/19 0405 07/26/19 0537 07/27/19 0315  Weight: 93.6 kg 94.1 kg 94.2 kg    Examination: General exam: Appears calm and comfortable  Respiratory system: Clear to auscultation anteriorly, tachypneic this morning, BiPAP in place Cardiovascular system: S1 & S2 heard, RRR. No pedal edema. Gastrointestinal system: Abdomen is nondistended, soft and nontender. Normal bowel sounds heard. Central nervous system:  Alert  Extremities: Symmetric in appearance bilaterally  Skin: No rashes, lesions or ulcers on exposed skin     Data Reviewed: I have personally reviewed following labs and imaging studies  CBC: Recent Labs  Lab 07/13/2019 0614 08/06/2019 0614 07/23/19 0344 07/24/19 0405 07/25/19 0323 07/26/19 0340 07/27/19 0436  WBC 14.1*   < > 12.1* 12.1* 13.9* 16.1* 13.3*  NEUTROABS 10.8*  --   --   --   --   --   --   HGB 12.1*   < > 11.8* 11.6* 11.9* 11.9* 10.6*  HCT 39.1   < > 38.2* 36.8* 37.5* 37.6* 34.0*  MCV 99.0   < > 99.2 97.9 96.9 97.2 98.8  PLT 310    < > 265 262 278 293 262   < > = values in this interval not displayed.   Basic Metabolic Panel: Recent Labs  Lab 07/23/19 0344 07/24/19 0405 07/25/19 0323 07/26/19 0340 07/27/19 0436  NA 143 140 141 141 140  K 3.7 3.8 3.9 4.3 4.0  CL 99 101 96* 98 98  CO2 32 33* 32 30 32  GLUCOSE 130* 144* 157* 136* 133*  BUN 29* 27* 25* 24* 26*  CREATININE 1.08 1.16 1.07 1.02 1.04  CALCIUM 8.3* 8.0* 8.3* 8.2* 7.9*   GFR: Estimated Creatinine Clearance: 71.7 mL/min (by C-G formula based on SCr of 1.04 mg/dL). Liver Function Tests: Recent Labs  Lab 07/21/19 0353 08/01/2019 0614 07/24/19 0405 07/26/19 0340  AST 200* 153* 120* 89*  ALT 283* 267* 215* 200*  ALKPHOS 113 115 109 121  BILITOT 0.5 0.7 0.3 0.3  PROT 5.6* 5.9* 5.5* 6.0*  ALBUMIN 2.3* 2.4* 2.1* 2.4*   No results for input(s): LIPASE, AMYLASE in the last 168 hours. No results for input(s): AMMONIA in the last 168 hours. Coagulation Profile: No results for input(s): INR, PROTIME in the last 168 hours. Cardiac Enzymes: No results for input(s): CKTOTAL, CKMB, CKMBINDEX, TROPONINI in the last 168 hours. BNP (last 3 results) No results for input(s): PROBNP in the last 8760 hours. HbA1C: No results for input(s): HGBA1C in the last 72 hours. CBG: Recent Labs  Lab 07/26/19 1629 07/26/19 1929 07/26/19 2332 07/27/19 0313 07/27/19 0817  GLUCAP 107* 116* 124* 110* 152*   Lipid Profile: No results for input(s): CHOL, HDL, LDLCALC, TRIG, CHOLHDL, LDLDIRECT in the last 72 hours. Thyroid Function Tests: No results for input(s): TSH, T4TOTAL, FREET4, T3FREE, THYROIDAB in the last 72 hours. Anemia Panel: No results for input(s): VITAMINB12, FOLATE, FERRITIN, TIBC, IRON, RETICCTPCT in the last 72 hours. Sepsis Labs: Recent Labs  Lab 07/15/2019 1158  PROCALCITON 0.82    No results found for this or any previous visit (from the past 240 hour(s)).    Radiology Studies: DG CHEST PORT 1 VIEW  Result Date: 07/27/2019 CLINICAL  DATA:  Acute hypoxemic respiratory failure. EXAM: PORTABLE CHEST 1 VIEW COMPARISON:  Chest radiograph 07/17/2019 FINDINGS: Similar appearance of extensive bilateral airspace disease most prominent within the right lung apex. No sizable pleural effusion or evidence of pneumothorax. Cardiomediastinal silhouette unchanged. No acute bony abnormality. Overlying cardiac monitoring leads. IMPRESSION: Similar appearance of extensive bilateral airspace disease most prominent within the right lung apex. Electronically Signed   By: Kellie Simmering DO   On: 07/27/2019 08:54      Scheduled Meds:  amiodarone  200 mg Per Tube Daily   aspirin  324 mg Per Tube Daily   atorvastatin  80 mg Per Tube q1800   clopidogrel  75  mg Per Tube Daily   enoxaparin  40 mg Subcutaneous Q24H   free water  250 mL Per Tube Q6H   furosemide  20 mg Intravenous BID   insulin aspart  0-9 Units Subcutaneous Q4H   methylphenidate  5 mg Oral BID WC   sodium chloride flush  3 mL Intravenous Q12H   Continuous Infusions:  azithromycin 500 mg (07/26/19 1846)   cefTRIAXone (ROCEPHIN)  IV 2 g (07/26/19 1758)   feeding supplement (JEVITY 1.5 CAL/FIBER) 1,000 mL (07/23/19 0936)     LOS: 5 days      Time spent: 35 minutes   Dessa Phi, DO [facility] 07/27/2019, 9:55 AM   Available via Epic secure chat 7am-7pm After these hours, please refer to coverage provider listed on [email or web address]

## 2019-07-28 DIAGNOSIS — Z7189 Other specified counseling: Secondary | ICD-10-CM

## 2019-07-28 LAB — CBC
HCT: 33.6 % — ABNORMAL LOW (ref 39.0–52.0)
Hemoglobin: 10.5 g/dL — ABNORMAL LOW (ref 13.0–17.0)
MCH: 31.1 pg (ref 26.0–34.0)
MCHC: 31.3 g/dL (ref 30.0–36.0)
MCV: 99.4 fL (ref 80.0–100.0)
Platelets: 262 10*3/uL (ref 150–400)
RBC: 3.38 MIL/uL — ABNORMAL LOW (ref 4.22–5.81)
RDW: 14.9 % (ref 11.5–15.5)
WBC: 15.3 10*3/uL — ABNORMAL HIGH (ref 4.0–10.5)
nRBC: 0 % (ref 0.0–0.2)

## 2019-07-28 LAB — COMPREHENSIVE METABOLIC PANEL
ALT: 127 U/L — ABNORMAL HIGH (ref 0–44)
AST: 50 U/L — ABNORMAL HIGH (ref 15–41)
Albumin: 2.3 g/dL — ABNORMAL LOW (ref 3.5–5.0)
Alkaline Phosphatase: 114 U/L (ref 38–126)
Anion gap: 14 (ref 5–15)
BUN: 24 mg/dL — ABNORMAL HIGH (ref 8–23)
CO2: 32 mmol/L (ref 22–32)
Calcium: 8 mg/dL — ABNORMAL LOW (ref 8.9–10.3)
Chloride: 96 mmol/L — ABNORMAL LOW (ref 98–111)
Creatinine, Ser: 1.01 mg/dL (ref 0.61–1.24)
GFR calc Af Amer: >60 mL/min (ref >60)
GFR calc non Af Amer: >60 mL/min (ref >60)
Glucose, Bld: 139 mg/dL — ABNORMAL HIGH (ref 70–99)
Potassium: 4.2 mmol/L (ref 3.5–5.1)
Sodium: 142 mmol/L (ref 135–145)
Total Bilirubin: 0.5 mg/dL (ref 0.3–1.2)
Total Protein: 5.7 g/dL — ABNORMAL LOW (ref 6.5–8.1)

## 2019-07-28 LAB — GLUCOSE, CAPILLARY: Glucose-Capillary: 131 mg/dL — ABNORMAL HIGH (ref 70–99)

## 2019-07-28 NOTE — Progress Notes (Signed)
Patient ID: Zimir Stenberg, male   DOB: Jul 23, 1945, 74 y.o.   MRN: [id number]  This NP visited patient at the bedside as a follow up for  Palliative  Medicine  needs and emotional support.    Patient has had continued decline and as he transitions at EOL. Patient currently is unresponsive and remains off BiPap with family acceptance.  PRN Medications are helping Mr Surman be more comfortable.  Patient's [name]/Shelia, daughter [name] and sn/Thurman  are at the bedside,  Emotional support offered, children are coming to understanding that Mr Urbanovsky is dying.  All educated  on low-dose medications for symptom management to enhance comfort and dignity.    We discussed the difference between an aggressive medical intervention path and a palliative comfort path.  We discussed the concept of quality over quantity of life.  I again raised awareness to the patient's documented declaration of a desire for a natural death.    Family are waiting to talk with attending this morning, I spoke to Dr Neysa Bonito and updated him.  Discussed natural trajectory and expectations at EOL>  Discussed with [name] the importance of continued conversation with family and the  medical providers regarding overall plan of care and treatment options,  ensuring decisions are within the context of the patients values and GOCs.   Education and emotional support offered        Prognosis is limited, likely hours   Total time spent on the unit was 25  minutes  Greater than 50% of the time was spent in counseling and coordination of care  [name] NP  Palliative Medicine Team Team Phone # [phone] Pager [phone]

## 2019-07-28 NOTE — Progress Notes (Signed)
Attempted to adjust patient's mask and he grabbed it off, not allowing me to place it back on him.  I placed him on 15 liters of oxygen but patient became very dyspneic and was struggling to breathe.  Bipap now in use.

## 2019-07-28 NOTE — Significant Event (Signed)
I had a nice discussion with the patient's significant other, daughter and son regarding goals of care. After discussing patient's current status, prognosis and answering the families questions, the family has decided to change the patient's status to comfort measures only.   Orders have been changed to reflect comfort measures.   Full not to follow.  Marva Panda, DO

## 2019-07-28 NOTE — Progress Notes (Signed)
  Late ENtry for 2/16   SLP Cancellation Note  Patient Details Name: Levi Pruitt MRN: [id number] DOB: 08-21-1945   Cancelled treatment:        Pt on Bipap. Not appropriate for ST. Continue efforts   [name] 07/28/2019, 7:17 AM

## 2019-07-28 NOTE — Progress Notes (Signed)
PROGRESS NOTE    Levi Pruitt    Code Status: DNR  [id number] DOB: [birth year] DOA: 07/21/2019 LOS: 6 days  PCP: Patient, No Pcp Per CC: No chief complaint on file.      Hospital Summary  Levi Pruitt is a73 y.o.malewith medical history significant ofrecent CVA with right sided hemiparesis/dysphagia requiring PEG tube placement and complicated by inferior wall MI with subsequent discharge to CIR on 07/07/2019. While in inpatient rehab, patient'scondition deteriorated with worsening shortness of breathwith atrial fibrillation with RVR and hypotension on 2/4. Chest x-ray noted worsening infiltrates he was givenintravenous Lasix and readmitted into the hospital.His hospital course was complicated by waxing and waning mental status. He was continued on BiPAP,given IV Lasix, and empiric antibiotics for suspected recurrent aspiration. Cardiology was consulted due to the patient being in atrial fibrillation with hypotension,and hewas started on IV amiodarone. Cardiology adjusted medications of Lasix and amiodarone via PEG. On 2/8his mental status deteriorated and he was foundto have alarge subacute left PCA infarct and small subacute cortical infarcts in left frontal lobe. Right upper extremity doppler done due to swelling revealed an acute thrombus in the cephalic vein and antecubital site likely due to DVT. At that time it was recommended that he continue on dual antiplatelet therapy. He was able to be weaned off BiPAP down to nasal cannula oxygen and recommended to wear BiPAP at night for hypercapnic respiratory failure. Palliative care have been consulted due to patient's poor overall prognosis and family elected for no intubation. He medically optimized and cleared to resume his rehab course and admitted back to inpatient rehab 2/10.Overnight, the patient was on BiPAP, but this morning when it was removed, he had developed acute respiratory distress with increased work of  breathing. Patient was placed on BiPAP for work of breathing and transferred back to the hospital.  Unfortunately he has not been able to tolerate BiPAP.  2/17: Transition to comfort measures  A & P   Principal Problem:   Acute on chronic respiratory failure with hypoxia and hypercapnia (HCC) Active Problems:   CHF (congestive heart failure) (HCC)   Dysphagia, post-stroke   Transaminitis   History of CVA with residual deficit   History of myocardial infarction   Thrombosis of right cephalic vein   Goals of care, counseling/discussion   Do not intubate but use all other measures   Dyspnea   1. Acute on chronic respiratory failure with hypercapnia and hypoxemia suspected secondary to aspiration pneumonia as well as acute on chronic combined systolic and diastolic heart failure 2. Paroxysmal atrial fibrillation 3. AKI 4. Recent CVA with residual weakness and dysphagia 5. Recent acute inferior STEMI, 06/18/2019 6. Severe carotid stenosis, PCA, vertebral stenosis 7. Elevated LFTs 8. 8 mm parotid neoplasm 9. Right cephalic vein thrombosis 10. Type 2 diabetes  Please see prior note from today.  After a discussion with the patient's family, plans to transition to comfort measures.  Continue with Ativan as needed for anxiety, morphine as needed for pain.  Palliative care on board  Family Communication: Goals of care discussion today.  Patient's son, daughter and significant other have been updated  Disposition Plan:   Anticipate hospital death   Pressure injury documentation    None  Consultants  Palliative care  Procedures  None  Antibiotics   Anti-infectives (From admission, onward)   Start     Dose/Rate Route Frequency Ordered Stop   07/27/2019 1800  cefTRIAXone (ROCEPHIN) 2 g in sodium chloride 0.9 % 100 mL IVPB  Status:  Discontinued     2 g 200 mL/hr over 30 Minutes Intravenous Every 24 hours 07/25/2019 1745 07/28/19 0841   08/06/2019 1800  azithromycin (ZITHROMAX) 500 mg  in sodium chloride 0.9 % 250 mL IVPB  Status:  Discontinued     500 mg 250 mL/hr over 60 Minutes Intravenous Every 24 hours 07/24/2019 1749 07/28/19 0841        Subjective   Patient examined at bedside, sleeping obtunded.  Patient's family at bedside.  We had a long discussion.  See prior note.  Patient unable to provide history  Objective   Vitals:   07/28/19 0338 07/28/19 0426 07/28/19 0800 07/28/19 1848  BP: 130/76  (!) 145/80   Pulse: 78 80 85   Resp: (!) 21     Temp: 97.7 F (36.5 C)     TempSrc: Oral     SpO2:  95% 97% 96%  Weight:        Intake/Output Summary (Last 24 hours) at 07/28/2019 1901 Last data filed at 07/28/2019 1700 Gross per 24 hour  Intake --  Output 900 ml  Net -900 ml   Filed Weights   07/25/19 0405 07/26/19 0537 07/27/19 0315  Weight: 93.6 kg 94.1 kg 94.2 kg    Examination:  Physical Exam Constitutional:      General: He is not in acute distress.    Comments: Obtunded  Cardiovascular:     Rate and Rhythm: Normal rate and regular rhythm.  Pulmonary:     Comments: Abdominal breathing and accessory muscle use Abdominal:     General: There is no distension.  Musculoskeletal:     Right lower leg: No edema.     Left lower leg: No edema.     Data Reviewed: I have personally reviewed following labs and imaging studies  CBC: Recent Labs  Lab 08/08/2019 0614 07/23/19 0344 07/24/19 0405 07/25/19 0323 07/26/19 0340 07/27/19 0436 07/28/19 0359  WBC 14.1*   < > 12.1* 13.9* 16.1* 13.3* 15.3*  NEUTROABS 10.8*  --   --   --   --   --   --   HGB 12.1*   < > 11.6* 11.9* 11.9* 10.6* 10.5*  HCT 39.1   < > 36.8* 37.5* 37.6* 34.0* 33.6*  MCV 99.0   < > 97.9 96.9 97.2 98.8 99.4  PLT 310   < > 262 278 293 262 262   < > = values in this interval not displayed.   Basic Metabolic Panel: Recent Labs  Lab 07/24/19 0405 07/25/19 0323 07/26/19 0340 07/27/19 0436 07/28/19 0359  NA 140 141 141 140 142  K 3.8 3.9 4.3 4.0 4.2  CL 101 96* 98 98 96*   CO2 33* 32 30 32 32  GLUCOSE 144* 157* 136* 133* 139*  BUN 27* 25* 24* 26* 24*  CREATININE 1.16 1.07 1.02 1.04 1.01  CALCIUM 8.0* 8.3* 8.2* 7.9* 8.0*   GFR: Estimated Creatinine Clearance: 73.8 mL/min (by C-G formula based on SCr of 1.01 mg/dL). Liver Function Tests: Recent Labs  Lab 08/01/2019 0614 07/24/19 0405 07/26/19 0340 07/28/19 0359  AST 153* 120* 89* 50*  ALT 267* 215* 200* 127*  ALKPHOS 115 109 121 114  BILITOT 0.7 0.3 0.3 0.5  PROT 5.9* 5.5* 6.0* 5.7*  ALBUMIN 2.4* 2.1* 2.4* 2.3*   No results for input(s): LIPASE, AMYLASE in the last 168 hours. No results for input(s): AMMONIA in the last 168 hours. Coagulation Profile: No results for input(s): INR, PROTIME in the  last 168 hours. Cardiac Enzymes: No results for input(s): CKTOTAL, CKMB, CKMBINDEX, TROPONINI in the last 168 hours. BNP (last 3 results) No results for input(s): PROBNP in the last 8760 hours. HbA1C: No results for input(s): HGBA1C in the last 72 hours. CBG: Recent Labs  Lab 07/27/19 1123 07/27/19 1602 07/27/19 1911 07/27/19 2319 07/28/19 0318  GLUCAP 116* 133* 112* 107* 131*   Lipid Profile: No results for input(s): CHOL, HDL, LDLCALC, TRIG, CHOLHDL, LDLDIRECT in the last 72 hours. Thyroid Function Tests: No results for input(s): TSH, T4TOTAL, FREET4, T3FREE, THYROIDAB in the last 72 hours. Anemia Panel: No results for input(s): VITAMINB12, FOLATE, FERRITIN, TIBC, IRON, RETICCTPCT in the last 72 hours. Sepsis Labs: Recent Labs  Lab 07/16/2019 1158  PROCALCITON 0.82    No results found for this or any previous visit (from the past 240 hour(s)).       Radiology Studies: DG CHEST PORT 1 VIEW  Result Date: 07/27/2019 CLINICAL DATA:  Acute hypoxemic respiratory failure. EXAM: PORTABLE CHEST 1 VIEW COMPARISON:  Chest radiograph 08/04/2019 FINDINGS: Similar appearance of extensive bilateral airspace disease most prominent within the right lung apex. No sizable pleural effusion or evidence  of pneumothorax. Cardiomediastinal silhouette unchanged. No acute bony abnormality. Overlying cardiac monitoring leads. IMPRESSION: Similar appearance of extensive bilateral airspace disease most prominent within the right lung apex. Electronically Signed   By: Kellie Simmering DO   On: 07/27/2019 08:54        Scheduled Meds: . sodium chloride flush  3 mL Intravenous Q12H   Continuous Infusions:   Time spent: 40 minutes with over 50% of the time coordinating the patient's care    Harold Hedge, DO [facility] Pager [phone]  Call night coverage person covering after 7pm

## 2019-07-28 NOTE — Progress Notes (Signed)
Pt expressed to family three times that he was in quote, "ready to go." Therefore, the power of attorney [name] and the pts two children decided it was time to take the pt off the bi-pap. I gave pt 1mg  of Morphine and 1 mg of Ativan before the bi-pap was taking off. Family at bedside with pt. Will continue to monitor his status and update oncoming day shift nurse.

## 2019-08-09 NOTE — Progress Notes (Signed)
Pt taken off the tele monitor. Spoke with son and daughter before removal and they both stated that it was okay. Pt seems to be remaining stable at spo2 in mid 80s and RR in 20s-30s.

## 2019-08-09 NOTE — Discharge Summary (Signed)
Physician Discharge Summary  Levi Pruitt [id number] DOB: 1946-03-31   PCP: Patient, No Pcp Per  Admit date: 07/21/2019 Discharge date: 08/26/2019 Length of Stay: 7 days   Code Status: DNR  Admitted From:  Home Discharged to:  deceased   [place]57 y.o.malewith medical history significant ofrecent CVA with right sided hemiparesis/dysphagia requiring PEG tube placement and complicated by inferior wall MI with subsequent discharge to CIR on 07/07/2019. While in inpatient rehab, patient'scondition deteriorated with worsening shortness of breathwith atrial fibrillation with RVR and hypotension on 2/4. Chest x-ray noted worsening infiltrates he was givenintravenous Lasix and readmitted into the hospital.His hospital course was complicated by waxing and waning mental status. He was continued on BiPAP,given IV Lasix, and empiric antibiotics for suspected recurrent aspiration. Cardiology was consulted due to the patient being in atrial fibrillation with hypotension,and hewas started on IV amiodarone. Cardiology adjusted medications of Lasix and amiodarone via PEG. On 2/8his mental status deteriorated and he was foundto have alarge subacute left PCA infarct and small subacute cortical infarcts in left frontal lobe. Right upper extremity doppler done due to swelling revealed an acute thrombus in the cephalic vein and antecubital site likely due to DVT. At that time it was recommended that he continue on dual antiplatelet therapy. He was able to be weaned off BiPAP down to nasal cannula oxygen and recommended to wear BiPAP at night for hypercapnic respiratory failure. Palliative care have been consulted due to patient's poor overall prognosis and family elected for no intubation. He medically optimized and cleared to resume his rehab course and admitted back to inpatient rehab 2/10.Overnight, the patient was on BiPAP, but this morning when it was removed, he  had developed acute respiratory distress with increased work of breathing. Patient was placed on BiPAP for work of breathing and transferred back to the hospital.  Unfortunately he has not been able to tolerate BiPAP.  2/17: Transition to comfort measures  August 25, 2022: patient pronounced deceased at 7:08 am by nursing staff  A & P   Principal Problem:   Acute on chronic respiratory failure with hypoxia and hypercapnia (HCC) Active Problems:   CHF (congestive heart failure) (HCC)   Dysphagia, post-stroke   Transaminitis   History of CVA with residual deficit   History of myocardial infarction   Thrombosis of right cephalic vein   Goals of care, counseling/discussion   Do not intubate but use all other measures   Dyspnea  1. Acute on chronic respiratory failure with hypercapnia and hypoxemia suspected secondary to aspiration pneumonia as well as acute on chronic combined systolic and diastolic heart failure 2. Paroxysmal atrial fibrillation 3. AKI 4. Recent CVA with residual weakness and dysphagia 5. Recent acute inferior STEMI, 06/18/2019 6. Severe carotid stenosis, PCA, vertebral stenosis 7. Elevated LFTs 8. 8 mm parotid neoplasm 9. Right cephalic vein thrombosis Type 2 diabetes   Consultants  . Palliative care  Procedures  . none  Antibiotics   Anti-infectives (From admission, onward)   Start     Dose/Rate Route Frequency Ordered Stop   08/07/2019 1800  cefTRIAXone (ROCEPHIN) 2 g in sodium chloride 0.9 % 100 mL IVPB  Status:  Discontinued     2 g 200 mL/hr over 30 Minutes Intravenous Every 24 hours 08/02/2019 1745 07/28/19 0841   07/17/2019 1800  azithromycin (ZITHROMAX) 500 mg in sodium chloride 0.9 % 250 mL IVPB  Status:  Discontinued     500 mg 250 mL/hr over 60 Minutes Intravenous Every 24 hours  07/12/2019 1749 07/28/19 0841       Subjective  Patient pronounced deceased at 7:08 am by nursing staff. I presented to the bedside and expressed my condolences to the family at  bedside.    Objective   Discharge Exam: Vitals:   07/28/19 1848 07/28/19 2010  BP:    Pulse:  (!) 43  Resp:  (!) 37  Temp:    SpO2: 96% 93%   Vitals:   07/28/19 0800 07/28/19 1848 07/28/19 2010 2019-08-15 0730  BP: (!) 145/80     Pulse: 85  (!) 43   Resp:   (!) 37   Temp:      TempSrc:      SpO2: 97% 96% 93%   Weight:    94.2 kg  Height:    5\' 9"  (1.753 m)    Physical Exam Nursing note reviewed.  Constitutional:      Comments: No response  HENT:     Mouth/Throat:     Mouth: Mucous membranes are dry.  Eyes:     Comments: closed  Pulmonary:     Comments: No air movement Abdominal:     General: There is no distension.  Musculoskeletal:     Comments: No ROM  Skin:    Comments: pale       The results of significant diagnostics from this hospitalization (including imaging, microbiology, ancillary and laboratory) are listed below for reference.     Microbiology: No results found for this or any previous visit (from the past 240 hour(s)).   Labs: BNP (last 3 results) Recent Labs    06/19/19 1515 07/15/19 0652 07/14/2019 1158  BNP 756.3* 893.6* [id number]*   Basic Metabolic Panel: Recent Labs  Lab 07/24/19 0405 07/25/19 0323 07/26/19 0340 07/27/19 0436 07/28/19 0359  NA 140 141 141 140 142  K 3.8 3.9 4.3 4.0 4.2  CL 101 96* 98 98 96*  CO2 33* 32 30 32 32  GLUCOSE 144* 157* 136* 133* 139*  BUN 27* 25* 24* 26* 24*  CREATININE 1.16 1.07 1.02 1.04 1.01  CALCIUM 8.0* 8.3* 8.2* 7.9* 8.0*   Liver Function Tests: Recent Labs  Lab 07/24/19 0405 07/26/19 0340 07/28/19 0359  AST 120* 89* 50*  ALT 215* 200* 127*  ALKPHOS 109 121 114  BILITOT 0.3 0.3 0.5  PROT 5.5* 6.0* 5.7*  ALBUMIN 2.1* 2.4* 2.3*   No results for input(s): LIPASE, AMYLASE in the last 168 hours. No results for input(s): AMMONIA in the last 168 hours. CBC: Recent Labs  Lab 07/24/19 0405 07/25/19 0323 07/26/19 0340 07/27/19 0436 07/28/19 0359  WBC 12.1* 13.9* 16.1* 13.3* 15.3*   HGB 11.6* 11.9* 11.9* 10.6* 10.5*  HCT 36.8* 37.5* 37.6* 34.0* 33.6*  MCV 97.9 96.9 97.2 98.8 99.4  PLT 262 278 293 262 262   Cardiac Enzymes: No results for input(s): CKTOTAL, CKMB, CKMBINDEX, TROPONINI in the last 168 hours. BNP: Invalid input(s): POCBNP CBG: Recent Labs  Lab 07/27/19 1123 07/27/19 1602 07/27/19 1911 07/27/19 2319 07/28/19 0318  GLUCAP 116* 133* 112* 107* 131*   D-Dimer No results for input(s): DDIMER in the last 72 hours. Hgb A1c No results for input(s): HGBA1C in the last 72 hours. Lipid Profile No results for input(s): CHOL, HDL, LDLCALC, TRIG, CHOLHDL, LDLDIRECT in the last 72 hours. Thyroid function studies No results for input(s): TSH, T4TOTAL, T3FREE, THYROIDAB in the last 72 hours.  Invalid input(s): FREET3 Anemia work up No results for input(s): VITAMINB12, FOLATE, FERRITIN, TIBC, IRON, RETICCTPCT in the  last 72 hours. Urinalysis    Component Value Date/Time   COLORURINE YELLOW 07/07/2019 1700   APPEARANCEUR CLEAR 07/07/2019 1700   LABSPEC 1.005 07/07/2019 1700   PHURINE 6.0 07/07/2019 1700   GLUCOSEU NEGATIVE 07/07/2019 1700   HGBUR NEGATIVE 07/07/2019 1700   BILIRUBINUR NEGATIVE 07/07/2019 1700   KETONESUR NEGATIVE 07/07/2019 1700   PROTEINUR NEGATIVE 07/07/2019 1700   NITRITE NEGATIVE 07/07/2019 1700   LEUKOCYTESUR NEGATIVE 07/07/2019 1700   Sepsis Labs Invalid input(s): PROCALCITONIN,  WBC,  LACTICIDVEN Microbiology No results found for this or any previous visit (from the past 240 hour(s)).  Discharge Instructions      Allergies as of 10-Aug-2019   No Known Allergies     Medication List    STOP taking these medications   amiodarone 200 MG tablet Commonly known as: PACERONE   aspirin 81 MG chewable tablet   atorvastatin 80 MG tablet Commonly known as: LIPITOR   clopidogrel 75 MG tablet Commonly known as: PLAVIX   enoxaparin 40 MG/0.4ML injection Commonly known as: LOVENOX   feeding supplement (JEVITY 1.5  CAL/FIBER) Liqd   free water Soln   furosemide 40 MG tablet Commonly known as: LASIX   insulin aspart 100 UNIT/ML injection Commonly known as: novoLOG   ipratropium-albuterol 0.5-2.5 (3) MG/3ML Soln Commonly known as: DUONEB   Melatonin 3 MG Tabs   methylphenidate 5 MG tablet Commonly known as: RITALIN       No Known Allergies  Time coordinating discharge: under 30 minutes   SIGNED:   Harold Hedge, D.O. [facility] Pager: [phone]  08-10-2019, 8:13 AM

## 2019-08-09 NOTE — Progress Notes (Addendum)
Pt expired @708 . Daughter and son at bedside.  Attending notified, stated he will be up shortly. Spoke with Ronney Asters from [facility].  Attending@ bedside spoke with family.  Family is in contact with funeral [facility] [employer] in [place], [place]).

## 2019-08-09 NOTE — Plan of Care (Signed)

## 2019-08-09 DEATH — deceased

## 2020-10-19 IMAGING — DX DG CHEST 1V PORT
1 series · 1 of 1 positions shown · non-contrast
Comparison: Chest radiograph dated 06/25/2019.

CLINICAL DATA: 73-year-old male with shortness of breath.

EXAM:
PORTABLE CHEST 1 VIEW

[chest ap]
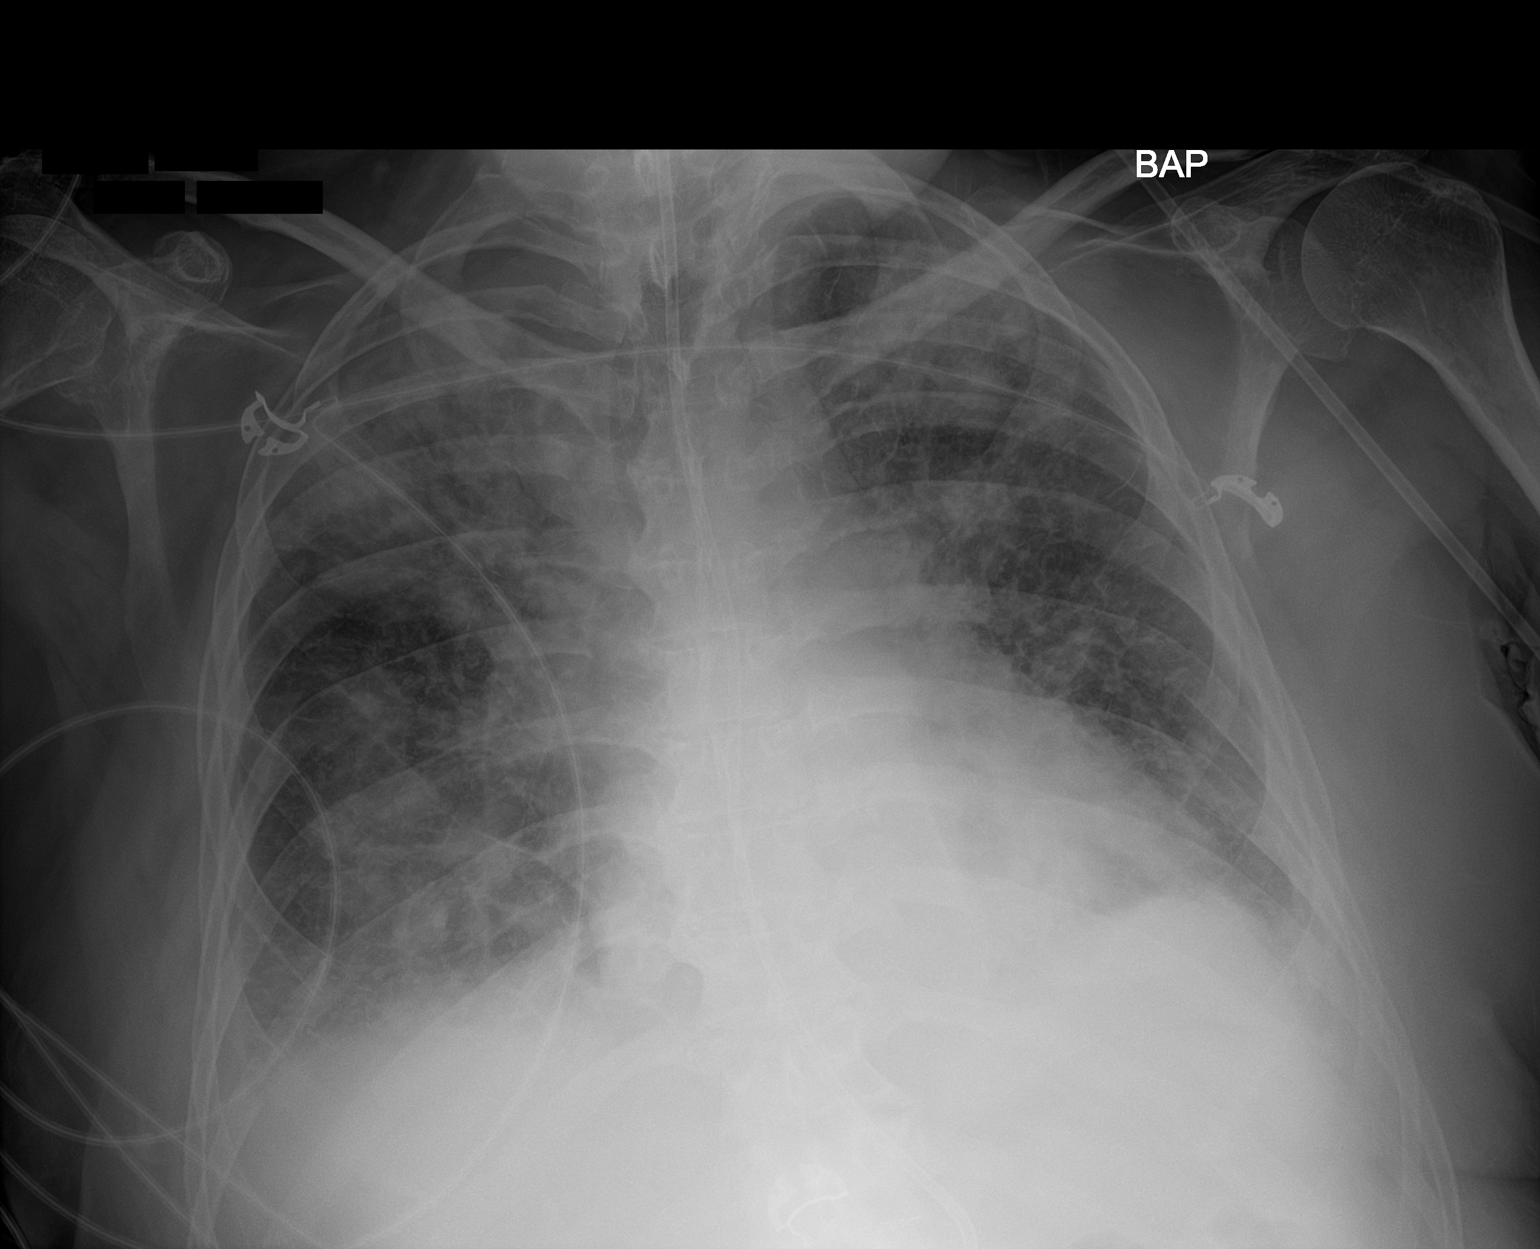

[1 of 1 positions shown; findings below may reference images not displayed]

FINDINGS: Feeding tube extends below the diaphragm.

Bilateral confluent airspace opacities may represent edema or
multifocal pneumonia or combination. Probable small bilateral
pleural effusions. No pneumothorax. Stable cardiac silhouette. No
acute osseous pathology.
IMPRESSION: Bilateral confluent densities may represent pneumonia or edema.
Clinical correlation and follow-up recommended.

## 2020-11-01 IMAGING — DX DG CHEST 1V PORT
1 series · 1 of 1 positions shown · non-contrast
Comparison: 07/11/2019

CLINICAL DATA: Acute respiratory distress

EXAM:
PORTABLE CHEST 1 VIEW

[chest ap]
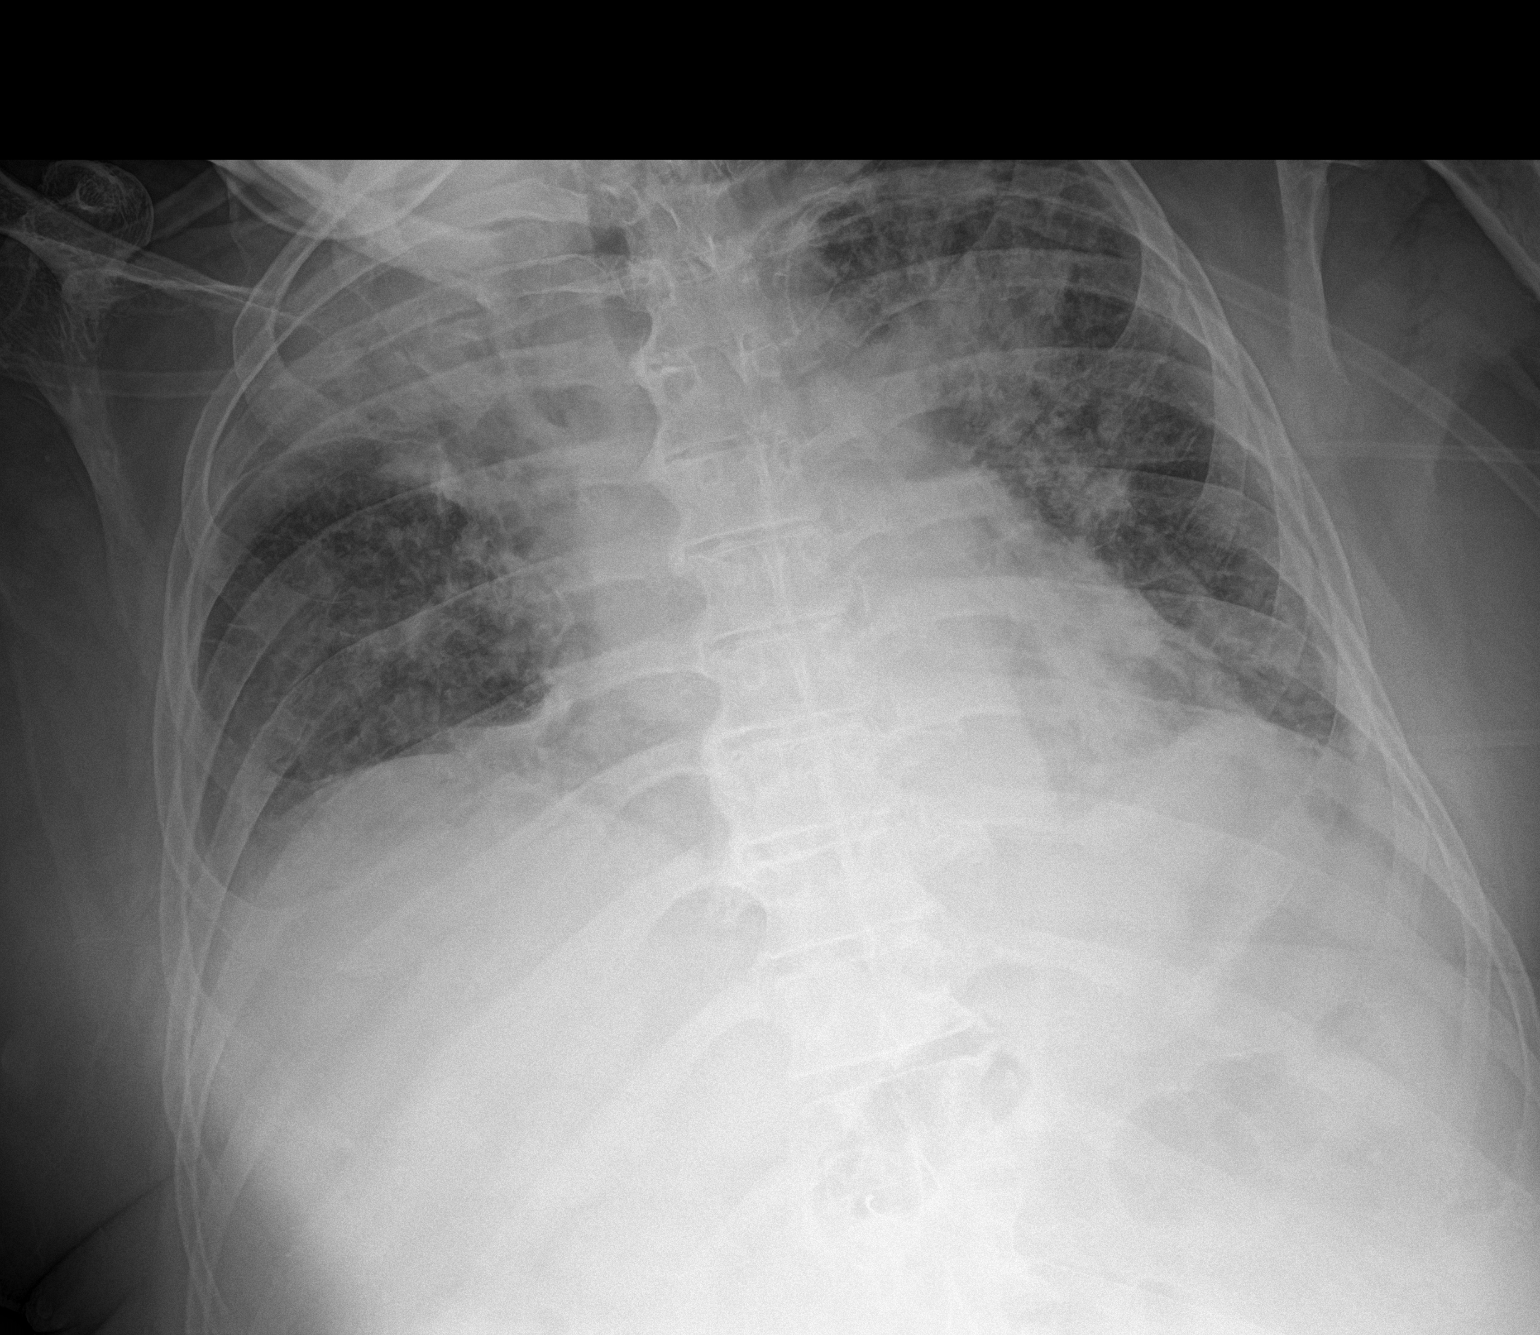

[1 of 1 positions shown; findings below may reference images not displayed]

FINDINGS: Cardiac shadow is stable. Persistent bilateral infiltrates are seen
worst in the right upper lobe stable from the prior exam. Slight
increase in the degree of infiltrative density in lung is seen. No
acute bony abnormality is noted.
IMPRESSION: Persistent consolidation in the right upper lobe with increasing
left-sided infiltrate when compared with the prior study.

## 2020-11-02 IMAGING — DX DG CHEST 1V PORT
1 series · 1 of 1 positions shown · non-contrast
Comparison: 07/15/2019, 07/11/2019

CLINICAL DATA: Short of breath, stroke

EXAM:
PORTABLE CHEST 1 VIEW

[chest ap]
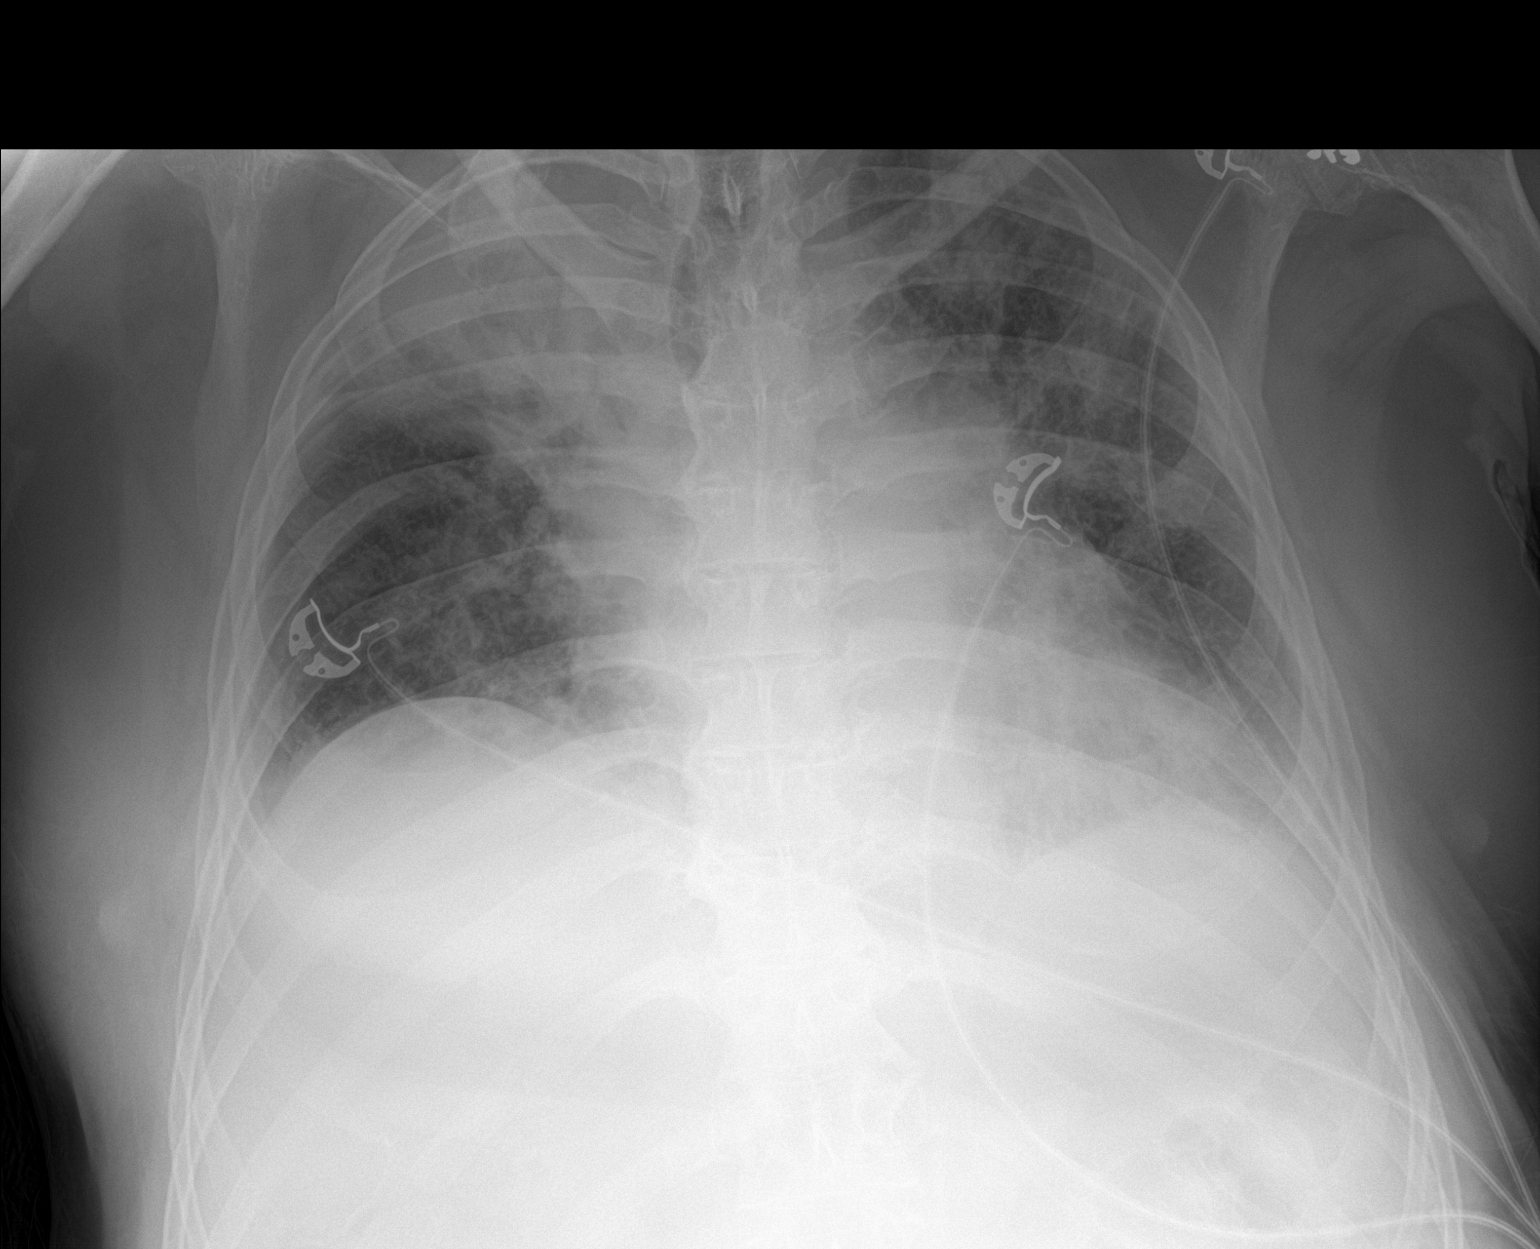

[1 of 1 positions shown; findings below may reference images not displayed]

FINDINGS: Single frontal view of the chest demonstrates progressive dense
consolidation within the right upper lobe. Persistent multifocal
airspace disease elsewhere throughout the lungs, greatest in the
left perihilar region. No large effusion or pneumothorax. Cardiac
silhouette is stable.
IMPRESSION: 1. Findings consistent with multifocal pneumonia, with increasing
consolidation right upper lobe.

## 2020-11-08 IMAGING — DX DG CHEST 1V
1 series · 1 of 1 positions shown · non-contrast
Comparison: Portable exam 2172 hours compared to 07/19/2019

CLINICAL DATA: Dyspnea, history stroke, MI, CHF, atrial
fibrillation

EXAM:
CHEST  1 VIEW

[chest ap]
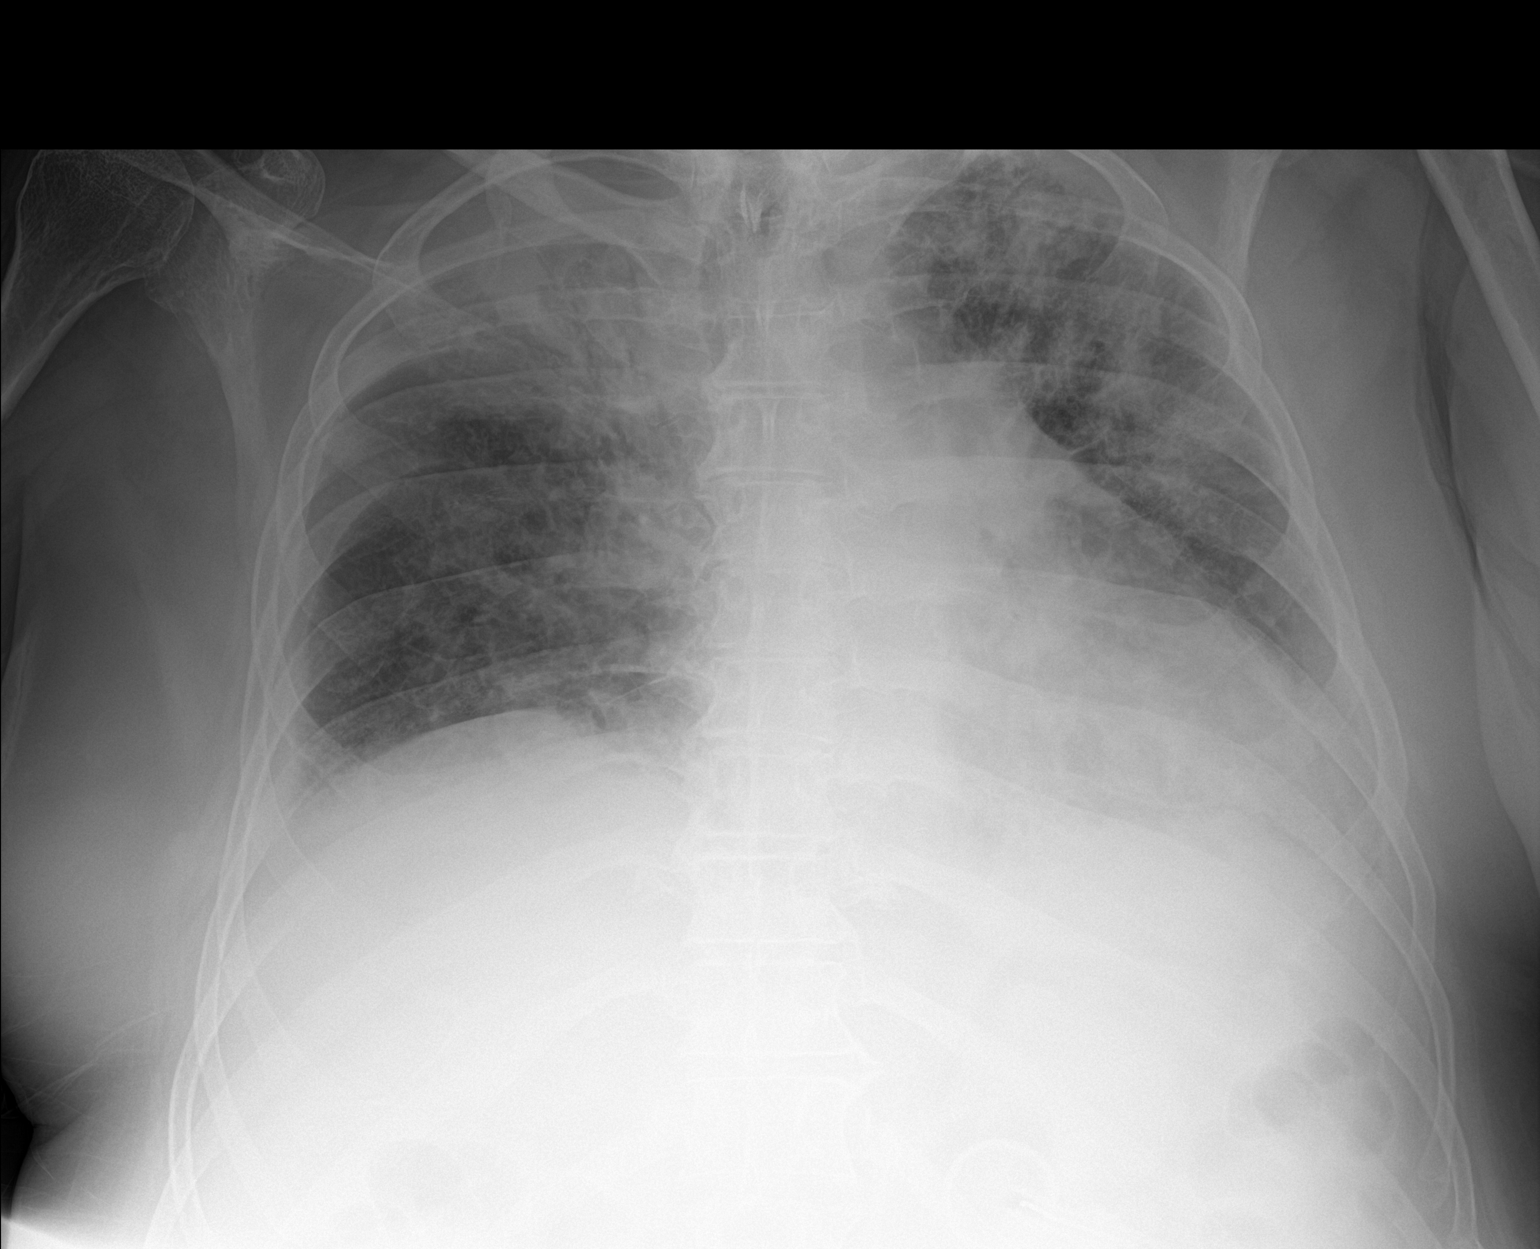

[1 of 1 positions shown; findings below may reference images not displayed]

FINDINGS: Upper normal heart size.

Stable mediastinal contours.

BILATERAL pulmonary infiltrates, greater in the upper lobes.

Associated volume loss and pleuroparenchymal thickening on RIGHT.

Suspect mild progression in LEFT upper lobe since prior study.

Minimal RIGHT pleural effusion.

No pneumothorax.

Bones demineralized.
IMPRESSION: Persistent BILATERAL pulmonary infiltrates consistent with
multifocal pneumonia, slightly increased in LEFT upper lobe.

## 2020-11-13 IMAGING — DX DG CHEST 1V PORT
1 series · 1 of 1 positions shown · non-contrast
Comparison: Chest radiograph 07/22/2019

CLINICAL DATA: Acute hypoxemic respiratory failure.

EXAM:
PORTABLE CHEST 1 VIEW

[chest ap]
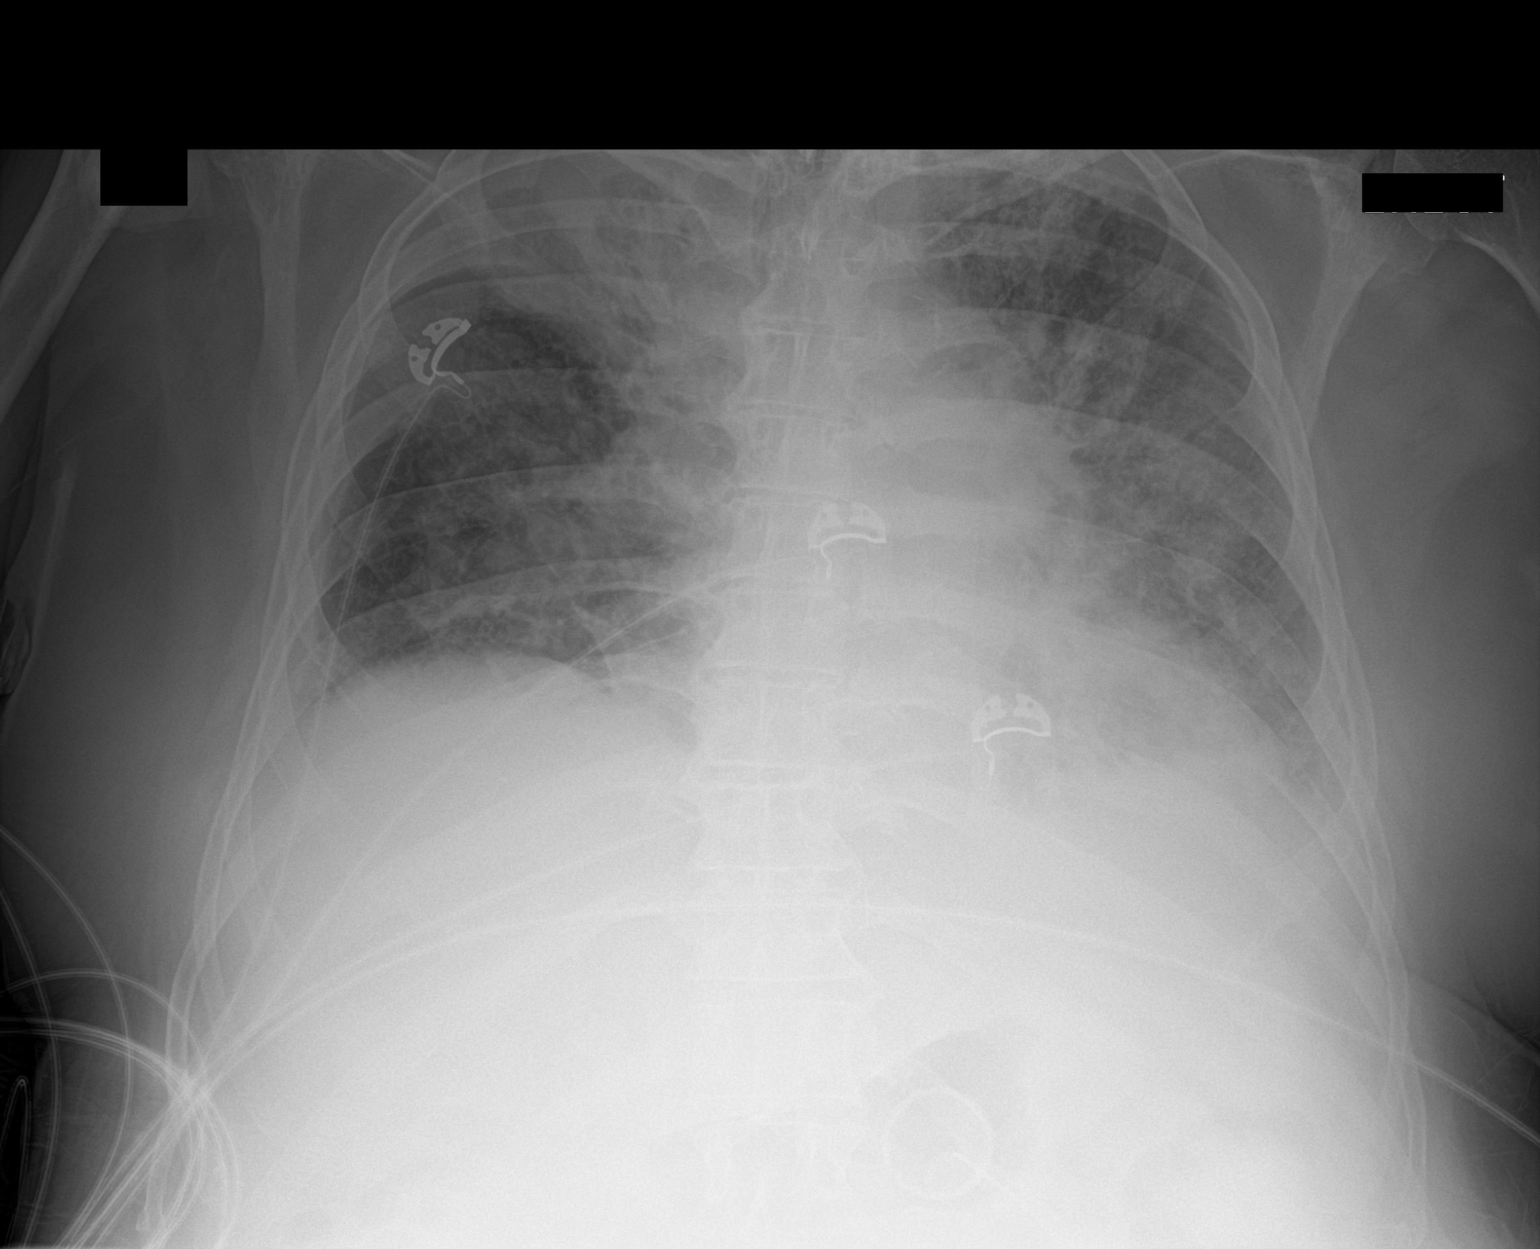

[1 of 1 positions shown; findings below may reference images not displayed]

FINDINGS: Similar appearance of extensive bilateral airspace disease most
prominent within the right lung apex. No sizable pleural effusion or
evidence of pneumothorax. Cardiomediastinal silhouette unchanged. No
acute bony abnormality. Overlying cardiac monitoring leads.
IMPRESSION: Similar appearance of extensive bilateral airspace disease most
prominent within the right lung apex.
